# Patient Record
Sex: Female | Born: 1960 | Race: Black or African American | Hispanic: No | Marital: Single | Smoking: Former smoker
Health system: Southern US, Community
[De-identification: ages and names within clinical notes are randomized; demographics above are authoritative.]

## PROBLEM LIST (undated history)

## (undated) DIAGNOSIS — R0981 Nasal congestion: Secondary | ICD-10-CM

## (undated) DIAGNOSIS — R011 Cardiac murmur, unspecified: Secondary | ICD-10-CM

## (undated) DIAGNOSIS — M1990 Unspecified osteoarthritis, unspecified site: Secondary | ICD-10-CM

## (undated) DIAGNOSIS — R42 Dizziness and giddiness: Secondary | ICD-10-CM

## (undated) DIAGNOSIS — M19049 Primary osteoarthritis, unspecified hand: Secondary | ICD-10-CM

## (undated) DIAGNOSIS — M79604 Pain in right leg: Secondary | ICD-10-CM

## (undated) DIAGNOSIS — E119 Type 2 diabetes mellitus without complications: Secondary | ICD-10-CM

## (undated) DIAGNOSIS — M79605 Pain in left leg: Secondary | ICD-10-CM

## (undated) HISTORY — DX: Type 2 diabetes mellitus without complications: E11.9

## (undated) HISTORY — DX: Nasal congestion: R09.81

## (undated) HISTORY — PX: HAND RECONSTRUCTION: SHX1730

## (undated) HISTORY — DX: Cardiac murmur, unspecified: R01.1

## (undated) HISTORY — DX: Dizziness and giddiness: R42

## (undated) HISTORY — DX: Pain in right leg: M79.604

## (undated) HISTORY — DX: Pain in right leg: M79.605

## (undated) HISTORY — DX: Primary osteoarthritis, unspecified hand: M19.049

## (undated) HISTORY — DX: Unspecified osteoarthritis, unspecified site: M19.90

---

## 2006-03-30 ENCOUNTER — Emergency Department: Payer: Self-pay | Admitting: Emergency Medicine

## 2006-05-20 ENCOUNTER — Emergency Department: Payer: Self-pay | Admitting: Emergency Medicine

## 2007-04-07 ENCOUNTER — Emergency Department: Payer: Self-pay | Admitting: Emergency Medicine

## 2007-08-11 ENCOUNTER — Emergency Department: Payer: Self-pay | Admitting: Emergency Medicine

## 2009-02-02 ENCOUNTER — Emergency Department: Payer: Self-pay | Admitting: Emergency Medicine

## 2009-03-03 ENCOUNTER — Emergency Department: Payer: Self-pay | Admitting: Unknown Physician Specialty

## 2009-04-18 ENCOUNTER — Emergency Department: Payer: Self-pay | Admitting: Emergency Medicine

## 2011-12-26 ENCOUNTER — Ambulatory Visit: Payer: Self-pay

## 2012-09-26 ENCOUNTER — Ambulatory Visit: Payer: Self-pay | Admitting: Family Medicine

## 2012-12-05 ENCOUNTER — Ambulatory Visit: Payer: Self-pay | Admitting: Gastroenterology

## 2012-12-20 ENCOUNTER — Ambulatory Visit: Payer: Self-pay | Admitting: Family Medicine

## 2013-01-20 ENCOUNTER — Emergency Department: Payer: Self-pay | Admitting: Emergency Medicine

## 2013-01-20 LAB — BASIC METABOLIC PANEL
Creatinine: 0.72 mg/dL (ref 0.60–1.30)
Osmolality: 277 (ref 275–301)
Potassium: 3.9 mmol/L (ref 3.5–5.1)

## 2013-01-20 LAB — CBC WITH DIFFERENTIAL/PLATELET
Basophil #: 0 10*3/uL (ref 0.0–0.1)
Basophil %: 0.8 %
Eosinophil #: 0.1 10*3/uL (ref 0.0–0.7)
Eosinophil %: 3 %
HCT: 40 % (ref 35.0–47.0)
HGB: 13.6 g/dL (ref 12.0–16.0)
Lymphocyte #: 2.2 10*3/uL (ref 1.0–3.6)
MCH: 29.4 pg (ref 26.0–34.0)
MCV: 87 fL (ref 80–100)
Monocyte %: 10.6 %
Neutrophil #: 1.7 10*3/uL (ref 1.4–6.5)
Neutrophil %: 37.1 %
RDW: 12.8 % (ref 11.5–14.5)

## 2013-01-20 LAB — TROPONIN I: Troponin-I: <0.02 ng/mL

## 2013-02-08 ENCOUNTER — Emergency Department: Payer: Self-pay | Admitting: Emergency Medicine

## 2013-02-08 LAB — URINALYSIS, COMPLETE
Bilirubin,UR: NEGATIVE
Blood: NEGATIVE
Ketone: NEGATIVE
Leukocyte Esterase: NEGATIVE
Ph: 7 (ref 4.5–8.0)
Specific Gravity: 1.012 (ref 1.003–1.030)

## 2013-02-08 LAB — WET PREP, GENITAL

## 2013-02-09 LAB — URINE CULTURE

## 2013-03-06 ENCOUNTER — Ambulatory Visit: Payer: Self-pay | Admitting: Family Medicine

## 2013-03-27 ENCOUNTER — Encounter: Payer: Self-pay | Admitting: Podiatry

## 2013-04-16 ENCOUNTER — Encounter: Payer: Self-pay | Admitting: Podiatry

## 2013-04-16 ENCOUNTER — Ambulatory Visit (INDEPENDENT_AMBULATORY_CARE_PROVIDER_SITE_OTHER): Payer: BC Managed Care – PPO | Admitting: Podiatry

## 2013-04-16 VITALS — BP 111/67 | HR 60 | Resp 16 | Ht 65.0 in | Wt 167.0 lb

## 2013-04-16 DIAGNOSIS — L608 Other nail disorders: Secondary | ICD-10-CM

## 2013-04-16 DIAGNOSIS — M79609 Pain in unspecified limb: Secondary | ICD-10-CM

## 2013-04-16 NOTE — Progress Notes (Signed)
   Subjective:    Patient ID: Brenda Ellison, female    DOB: 09/29/1960, 53 y.o.   MRN: [id number]  HPI Comments: Pt states that she is unsure why she was sent here, dr Sherryll Burgershah made the referral . She is complaining about legs hurting and is seeing a vascular doctor and having test run next week for her pain in legs.  her toenails are thick and discolored and has difficulty cutting them.      Review of Systems  HENT: Positive for ear pain, sneezing and sore throat.   Eyes: Positive for pain, redness and itching.  Respiratory: Positive for cough and chest tightness.   Cardiovascular: Positive for chest pain.       Calf pain when standing   Gastrointestinal: Positive for abdominal pain.  Endocrine:       Excessive thirst   Musculoskeletal: Positive for back pain.       Joint pain  Muscle pain   Neurological: Positive for dizziness, weakness, light-headedness and headaches.  Psychiatric/Behavioral: Positive for confusion. The patient is nervous/anxious.   All other systems reviewed and are negative.       Objective:   Physical Exam: I have reviewed her past medical history medications allergies surgeries social history. Pulses are palpable bilateral neurologic sensorium is intact deep tendon reflexes are intact muscle strength is intact orthopedic evaluation demonstrates all joints distal to the ankle a full range of motion without crepitation. Cutaneous evaluation demonstrates supple well hydrated cutis with exception of the hallux nails bilateral which do demonstrate thickening with subungual debris and are tender on palpation. It's hard to ascertain whether or not these are fungal nails versus dystrophic nails.        Assessment & Plan:  Assessment: Nail dystrophy until proven otherwise hallux bilateral.  Plan: Biopsy of the nail and skin today sent for pathology evaluation. We will inform her once her cultures come back.

## 2013-04-30 ENCOUNTER — Telehealth: Payer: Self-pay | Admitting: *Deleted

## 2013-04-30 NOTE — Telephone Encounter (Signed)
NO VOICEMAIL SET UP

## 2013-05-01 ENCOUNTER — Encounter: Payer: Self-pay | Admitting: Podiatry

## 2013-05-19 ENCOUNTER — Ambulatory Visit (INDEPENDENT_AMBULATORY_CARE_PROVIDER_SITE_OTHER): Payer: BC Managed Care – PPO | Admitting: Podiatry

## 2013-05-19 ENCOUNTER — Other Ambulatory Visit: Payer: Self-pay | Admitting: Podiatry

## 2013-05-19 ENCOUNTER — Encounter: Payer: Self-pay | Admitting: Podiatry

## 2013-05-19 VITALS — BP 123/78 | HR 60 | Resp 16

## 2013-05-19 DIAGNOSIS — Z79899 Other long term (current) drug therapy: Secondary | ICD-10-CM

## 2013-05-19 LAB — CBC WITH DIFFERENTIAL/PLATELET
BASOS ABS: 0.1 10*3/uL (ref 0.0–0.1)
BASOS PCT: 0.9 %
EOS ABS: 0.1 10*3/uL (ref 0.0–0.7)
Eosinophil %: 2.5 %
HCT: 38.5 % (ref 35.0–47.0)
HGB: 12.8 g/dL (ref 12.0–16.0)
LYMPHS ABS: 2.7 10*3/uL (ref 1.0–3.6)
LYMPHS PCT: 48.1 %
MCH: 29.2 pg (ref 26.0–34.0)
MCHC: 33.2 g/dL (ref 32.0–36.0)
MCV: 88 fL (ref 80–100)
MONO ABS: 0.4 x10 3/mm (ref 0.2–0.9)
MONOS PCT: 7.9 %
NEUTROS PCT: 40.6 %
Neutrophil #: 2.3 10*3/uL (ref 1.4–6.5)
PLATELETS: 206 10*3/uL (ref 150–440)
RBC: 4.37 10*6/uL (ref 3.80–5.20)
RDW: 13.1 % (ref 11.5–14.5)
WBC: 5.6 10*3/uL (ref 3.6–11.0)

## 2013-05-19 LAB — HEPATIC FUNCTION PANEL A (ARMC)
ALBUMIN: 3.7 g/dL (ref 3.4–5.0)
ALK PHOS: 96 U/L
AST: 18 U/L (ref 15–37)
BILIRUBIN DIRECT: 0.1 mg/dL (ref 0.00–0.20)
Bilirubin,Total: 0.5 mg/dL (ref 0.2–1.0)
SGPT (ALT): 24 U/L (ref 12–78)
TOTAL PROTEIN: 7.7 g/dL (ref 6.4–8.2)

## 2013-05-19 MED ORDER — TERBINAFINE HCL 250 MG PO TABS: 250.0000 mg | ORAL_TABLET | Freq: Every day | ORAL | Status: DC

## 2013-05-19 NOTE — Progress Notes (Signed)
She presents today for followup of her fungal culture. There is positive for fungus.  Objective: Vital signs are stable she is alert and oriented x3. Onychomycosis.  Assessment: Pain in limb secondary to onychomycosis.  Plan: Discussed etiology pathology conservative versus surgical therapies. At this point we are going to initiate Lamisil therapy 250 mg tablets one by mouth daily #30 with no refills. She'll also have blood work drawn for me.

## 2013-06-11 ENCOUNTER — Telehealth: Payer: Self-pay | Admitting: *Deleted

## 2013-06-11 NOTE — Telephone Encounter (Signed)
Tried to call patient regarding her blood work , just a reminder to have it done  , pt does not have a voice mail set up.

## 2013-06-16 ENCOUNTER — Ambulatory Visit (INDEPENDENT_AMBULATORY_CARE_PROVIDER_SITE_OTHER): Payer: BC Managed Care – PPO | Admitting: Podiatry

## 2013-06-16 ENCOUNTER — Encounter: Payer: Self-pay | Admitting: Podiatry

## 2013-06-16 VITALS — BP 122/80 | HR 60 | Resp 16

## 2013-06-16 DIAGNOSIS — Z79899 Other long term (current) drug therapy: Secondary | ICD-10-CM

## 2013-06-16 MED ORDER — TERBINAFINE HCL 250 MG PO TABS: 250.0000 mg | ORAL_TABLET | Freq: Every day | ORAL | Status: DC

## 2013-06-16 NOTE — Progress Notes (Signed)
She presents today after her first dose of Lamisil states that to start C. little bit of a difference.  Objective: No nail changes as of yet today.  Assessment: Onychomycosis bilateral.  Plan: Started her on her 90 day dose of Lamisil 250 mg 1 by mouth daily. She was also sent for liver profile and CBC. I will followup with her should this come back abnormal. Otherwise I will followup with her in 4 weeks

## 2013-06-17 ENCOUNTER — Other Ambulatory Visit: Payer: Self-pay | Admitting: Podiatry

## 2013-06-17 LAB — CBC WITH DIFFERENTIAL/PLATELET
BASOS ABS: 0 10*3/uL (ref 0.0–0.1)
BASOS PCT: 0.9 %
EOS ABS: 0.2 10*3/uL (ref 0.0–0.7)
EOS PCT: 3.5 %
HCT: 39.1 % (ref 35.0–47.0)
HGB: 13.1 g/dL (ref 12.0–16.0)
Lymphocyte #: 2.1 10*3/uL (ref 1.0–3.6)
Lymphocyte %: 46.6 %
MCH: 29.7 pg (ref 26.0–34.0)
MCHC: 33.5 g/dL (ref 32.0–36.0)
MCV: 89 fL (ref 80–100)
MONO ABS: 0.4 x10 3/mm (ref 0.2–0.9)
Monocyte %: 8.7 %
Neutrophil #: 1.8 10*3/uL (ref 1.4–6.5)
Neutrophil %: 40.3 %
PLATELETS: 199 10*3/uL (ref 150–440)
RBC: 4.4 10*6/uL (ref 3.80–5.20)
RDW: 13.1 % (ref 11.5–14.5)
WBC: 4.5 10*3/uL (ref 3.6–11.0)

## 2013-06-17 LAB — HEPATIC FUNCTION PANEL A (ARMC)
ALK PHOS: 96 U/L
ALT: 26 U/L (ref 12–78)
Albumin: 3.7 g/dL (ref 3.4–5.0)
Bilirubin,Total: 0.4 mg/dL (ref 0.2–1.0)
SGOT(AST): 16 U/L (ref 15–37)
Total Protein: 7.8 g/dL (ref 6.4–8.2)

## 2013-06-23 ENCOUNTER — Telehealth: Payer: Self-pay | Admitting: *Deleted

## 2013-06-23 NOTE — Telephone Encounter (Signed)
Brenda Ellison CAME INTO OFFICE STATING THAT SHE IS HAVING A REACTION TO THE LAMISIL POSSIBLY. SHE HAS ALREADY COMPLETED A MONTH OF THE LAMISIL WITH NO PROBLEM. EXPLAINED TO PATIENT THAT IT MAY NOT BE THE LAMISIL BUT SHE CAN START TAKING IT EVERY OTHER DAY INSTEAD OF EVERY DAY. SHE STATED THAT HER HANDS AND FEET WERE ITCHY AND THAT SHE WOKE UP Friday WITH DIARRHEA

## 2013-06-24 ENCOUNTER — Encounter: Payer: Self-pay | Admitting: *Deleted

## 2013-06-24 NOTE — Progress Notes (Signed)
HAD CALLED [facility] FOR BLOOD WORK X3. HAVE NOT RECEIVED INFO YET. ROBIN AT [facility] SAID SHE WILL MANUALLY PRINT OUT BLOOD WORK AND FAX OVER RESULTS.

## 2013-08-13 ENCOUNTER — Ambulatory Visit: Payer: Self-pay | Admitting: Family Medicine

## 2013-10-13 ENCOUNTER — Ambulatory Visit: Payer: BC Managed Care – PPO | Admitting: Podiatry

## 2013-10-22 ENCOUNTER — Ambulatory Visit: Payer: BC Managed Care – PPO | Admitting: Podiatry

## 2014-03-09 ENCOUNTER — Ambulatory Visit: Payer: Self-pay | Admitting: Podiatry

## 2014-03-12 ENCOUNTER — Ambulatory Visit: Payer: Self-pay | Admitting: Family Medicine

## 2014-03-16 ENCOUNTER — Ambulatory Visit (INDEPENDENT_AMBULATORY_CARE_PROVIDER_SITE_OTHER): Payer: 59 | Admitting: Podiatry

## 2014-03-16 ENCOUNTER — Ambulatory Visit (INDEPENDENT_AMBULATORY_CARE_PROVIDER_SITE_OTHER): Payer: Commercial Managed Care - PPO

## 2014-03-16 DIAGNOSIS — B351 Tinea unguium: Secondary | ICD-10-CM

## 2014-03-16 DIAGNOSIS — M6740 Ganglion, unspecified site: Secondary | ICD-10-CM | POA: Diagnosis not present

## 2014-03-16 DIAGNOSIS — M79673 Pain in unspecified foot: Secondary | ICD-10-CM | POA: Diagnosis not present

## 2014-03-16 NOTE — Progress Notes (Signed)
She presents today after utilizing Lamisil over a year ago. She states that the nails look much better except for the fifth toes she also has a spur to the dorsal aspect left foot.  Objective: Vital signs are stable she is alert and oriented 3. Nails are thick and dystrophic but non-mycotic.  Assessment: Pain in limb secondary to dystrophic nails. 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 With neuralgias and paresthesias.  Plan: Debridement of nails 1 through 5 bilateral. Discussed appropriate lacing technique for her left foot.

## 2014-05-01 DIAGNOSIS — I340 Nonrheumatic mitral (valve) insufficiency: Secondary | ICD-10-CM | POA: Insufficient documentation

## 2014-05-01 DIAGNOSIS — I493 Ventricular premature depolarization: Secondary | ICD-10-CM | POA: Insufficient documentation

## 2014-05-01 DIAGNOSIS — I10 Essential (primary) hypertension: Secondary | ICD-10-CM | POA: Insufficient documentation

## 2014-06-22 ENCOUNTER — Encounter: Payer: Self-pay | Admitting: Podiatry

## 2014-06-22 ENCOUNTER — Ambulatory Visit (INDEPENDENT_AMBULATORY_CARE_PROVIDER_SITE_OTHER): Payer: 59 | Admitting: Podiatry

## 2014-06-22 VITALS — BP 120/74 | HR 64 | Resp 16

## 2014-06-22 DIAGNOSIS — M6740 Ganglion, unspecified site: Secondary | ICD-10-CM | POA: Diagnosis not present

## 2014-06-22 DIAGNOSIS — B351 Tinea unguium: Secondary | ICD-10-CM

## 2014-06-22 DIAGNOSIS — M79673 Pain in unspecified foot: Secondary | ICD-10-CM | POA: Diagnosis not present

## 2014-06-22 NOTE — Progress Notes (Signed)
She presents today with chief complaint of a knot to the dorsal aspect of her left foot as well as painfully elongated thick dystrophic mycotic nails.  Objective: Vital signs are stable alert and oriented 3. Pulses are palpable bilateral. Nails are thick yellow dystrophic, mycotic and painful palpation. Dorsal tarsal exostosis left foot does not demonstrate ganglion at this point.  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 Pain in limb secondary to onychomycosis.  Plan: Discussed the need for surgical intervention regarding her left foot. Also debrided nails 1 through 5 bilateral.

## 2014-10-02 ENCOUNTER — Emergency Department
Admission: EM | Admit: 2014-10-02 | Discharge: 2014-10-02 | Disposition: A | Discharge status: Home / Self Care | Payer: Self-pay | Attending: Emergency Medicine | Admitting: Emergency Medicine

## 2014-10-02 DIAGNOSIS — L508 Other urticaria: Secondary | ICD-10-CM

## 2014-10-02 DIAGNOSIS — L509 Urticaria, unspecified: Secondary | ICD-10-CM | POA: Insufficient documentation

## 2014-10-02 DIAGNOSIS — Z87891 Personal history of nicotine dependence: Secondary | ICD-10-CM | POA: Insufficient documentation

## 2014-10-02 MED ORDER — CIMETIDINE 200 MG PO TABS: 200.0000 mg | ORAL_TABLET | Freq: Two times a day (BID) | ORAL | Status: DC

## 2014-10-02 MED ORDER — CYPROHEPTADINE HCL 4 MG PO TABS: 4.0000 mg | ORAL_TABLET | Freq: Three times a day (TID) | ORAL | Status: DC | PRN

## 2014-10-02 MED ORDER — FAMOTIDINE 20 MG PO TABS
40.0000 mg | ORAL_TABLET | Freq: Two times a day (BID) | ORAL | Status: DC
Start: 2014-10-02 — End: 2014-10-02
  Administered 2014-10-02: 40 mg via ORAL
  Filled 2014-10-02: qty 2

## 2014-10-02 NOTE — ED Provider Notes (Signed)
[facility] Emergency Department Provider Note ____________________________________________  Time seen: 1518  I have reviewed the triage vital signs and the nursing notes.  HISTORY  Chief Complaint  Rash  HPI Brenda Ellison is a 54 y.o. female reports to the ED with c/o itchy rash to her legs, breasts, torso, and legs for the last week. She is unclear of the exposure or contact. She works as a Public affairs consultant at [employer], but denies any exposures there. She has not had any fevers, chills, sweats, or SOB.  She also denies any involvement if her mucous membranes.   Past Medical History  Diagnosis Date  . Dizziness of unknown cause   . Arthritis pain   . Chronic nasal congestion   . Heart murmur previously undiagnosed   . Osteoarthritis, hand   . Bilateral leg pain    There are no active problems to display for this patient.  History reviewed. No pertinent past surgical history.  Current Outpatient Rx  Name  Route  Sig  Dispense  Refill  . cimetidine (TAGAMET) 200 MG tablet   Oral   Take 1 tablet (200 mg total) by mouth 2 (two) times daily.   20 tablet   0   . cyproheptadine (PERIACTIN) 4 MG tablet   Oral   Take 1 tablet (4 mg total) by mouth 3 (three) times daily as needed for allergies.   30 tablet   0    Allergies Review of patient's allergies indicates no known allergies.  No family history on file.  Social History Social History  Substance Use Topics  . Smoking status: Former Games developer  . Smokeless tobacco: None  . Alcohol Use: No   Review of Systems  Constitutional: Negative for fever. Eyes: Negative for visual changes. ENT: Negative for sore throat. Cardiovascular: Negative for chest pain. Respiratory: Negative for shortness of breath. Gastrointestinal: Negative for abdominal pain, vomiting and diarrhea. Genitourinary: Negative for dysuria. Musculoskeletal: Negative for back pain. Skin: Positive for rash. Neurological:  Negative for headaches, focal weakness or numbness. ____________________________________________  PHYSICAL EXAM:  VITAL SIGNS: ED Triage Vitals  Enc Vitals Group     BP 10/02/14 1423 137/83 mmHg     Pulse Rate 10/02/14 1423 59     Resp 10/02/14 1423 16     Temp 10/02/14 1423 98.2 F (36.8 C)     Temp Source 10/02/14 1423 Oral     SpO2 10/02/14 1423 97 %     Weight 10/02/14 1423 165 lb (74.844 kg)     Height 10/02/14 1423  (1.6 m)     Head Cir --      Peak Flow --      Pain Score --      Pain Loc --      Pain Edu? --      Excl. in GC? --    Constitutional: Alert and oriented. Well appearing and in no distress. Eyes: Conjunctivae are normal. PERRL. Normal extraocular movements. ENT   Head: Normocephalic and atraumatic.   Nose: No congestion/rhinnorhea.   Mouth/Throat: Mucous membranes are moist.   Neck: Supple. No thyromegaly. Hematological/Lymphatic/Immunilogical: No cervical lymphadenopathy. Cardiovascular: Normal rate, regular rhythm.  Respiratory: Normal respiratory effort. No wheezes/rales/rhonchi. Gastrointestinal: Soft and nontender. No distention. Musculoskeletal: Nontender with normal range of motion in all extremities.  Neurologic:  Normal gait without ataxia. Normal speech and language. No gross focal neurologic deficits are appreciated. Skin:  Skin is warm, dry and intact. Erythematous, maculopapular rash noted to  the arms chest, torso, and legs, consistent with hives. Psychiatric: Mood and affect are normal. Patient exhibits appropriate insight and judgment. ____________________________________________  PROCEDURES  Famotidine 40 mg PO ____________________________________________  INITIAL IMPRESSION / ASSESSMENT AND PLAN / ED COURSE  Urticaria of unknown cause Treatment with Periactin and Tagamet for histamine blockade. Follow-up with primary provider as needed. ____________________________________________  FINAL CLINICAL IMPRESSION(S) /  ED DIAGNOSES  Final diagnoses:  Urticaria, acute     Lissa Hoard, PA-C 10/03/14 1412  Maurilio Lovely, MD 10/05/14 2016

## 2014-10-02 NOTE — Discharge Instructions (Signed)
Hives Hives are itchy, red, swollen areas of the skin. They can vary in size and location on your body. Hives can come and go for hours or several days (acute hives) or for several weeks (chronic hives). Hives do not spread from person to person (noncontagious). They may get worse with scratching, exercise, and emotional stress. CAUSES   Allergic reaction to food, additives, or drugs.  Infections, including the common cold.  Illness, such as vasculitis, lupus, or thyroid disease.  Exposure to sunlight, heat, or cold.  Exercise.  Stress.  Contact with chemicals. SYMPTOMS   Red or white swollen patches on the skin. The patches may change size, shape, and location quickly and repeatedly.  Itching.  Swelling of the hands, feet, and face. This may occur if hives develop deeper in the skin. DIAGNOSIS  Your caregiver can usually tell what is wrong by performing a physical exam. Skin or blood tests may also be done to determine the cause of your hives. In some cases, the cause cannot be determined. TREATMENT  Mild cases usually get better with medicines such as antihistamines. Severe cases may require an emergency epinephrine injection. If the cause of your hives is known, treatment includes avoiding that trigger.  HOME CARE INSTRUCTIONS   Avoid causes that trigger your hives.  Take antihistamines as directed by your caregiver to reduce the severity of your hives. Non-sedating or low-sedating antihistamines are usually recommended. Do not drive while taking an antihistamine.  Take any other medicines prescribed for itching as directed by your caregiver.  Wear loose-fitting clothing.  Keep all follow-up appointments as directed by your caregiver. SEEK MEDICAL CARE IF:   You have persistent or severe itching that is not relieved with medicine.  You have painful or swollen joints. SEEK IMMEDIATE MEDICAL CARE IF:   You have a fever.  Your tongue or lips are swollen.  You have  trouble breathing or swallowing.  You feel tightness in the throat or chest.  You have abdominal pain. These problems may be the first sign of a life-threatening allergic reaction. Call your local emergency services (911 in [place] MAKE SURE YOU:   Understand these instructions.  Will watch your condition.  Will get help right away if you are not doing well or get worse. Document Released: 02/06/2005 Document Revised: 02/11/2013 Document Reviewed: 05/02/2011 [facility] Patient Information 2015 [place], [place]. This information is not intended to replace advice given to you by your health care provider. Make sure you discuss any questions you have with your health care provider.   You are having an allergic reaction to an unknown source or contact.  Take the prescription meds as directed for allergy relief.  Apply OTC hydrocortisone cream as needed for itch relief.  Follow-up with Dr. Kathi Ludwig as needed.

## 2014-10-02 NOTE — ED Notes (Signed)
Pt c/o itchy rash all over for over a week.Marland Kitchen

## 2014-10-05 ENCOUNTER — Ambulatory Visit (INDEPENDENT_AMBULATORY_CARE_PROVIDER_SITE_OTHER): Payer: 59 | Admitting: Family Medicine

## 2014-10-05 ENCOUNTER — Encounter: Payer: Self-pay | Admitting: Family Medicine

## 2014-10-05 VITALS — BP 118/77 | HR 82 | Temp 98.7°F | Resp 17 | Ht 63.0 in | Wt 162.2 lb

## 2014-10-05 DIAGNOSIS — L200 Besnier's prurigo: Secondary | ICD-10-CM | POA: Diagnosis not present

## 2014-10-05 DIAGNOSIS — L239 Allergic contact dermatitis, unspecified cause: Secondary | ICD-10-CM

## 2014-10-05 DIAGNOSIS — E785 Hyperlipidemia, unspecified: Secondary | ICD-10-CM | POA: Insufficient documentation

## 2014-10-05 DIAGNOSIS — M5410 Radiculopathy, site unspecified: Secondary | ICD-10-CM | POA: Insufficient documentation

## 2014-10-05 DIAGNOSIS — M19049 Primary osteoarthritis, unspecified hand: Secondary | ICD-10-CM | POA: Insufficient documentation

## 2014-10-05 NOTE — Progress Notes (Signed)
Name: Brenda Ellison   MRN: [id number]    DOB: 15-Jul-1960   Date:10/05/2014       Progress Note  Subjective  Chief Complaint  Chief Complaint  Patient presents with  . Annual Exam    CPE  . Hypertension  . Hyperlipidemia    HPI Pt. Is here for ER follow up. Seen at the [facility] ER on Friday, October 02, 2014 for rash and itching on her legs, torso, and on her breasts. She was diagnosed with Hives and started on Periactin and Tagamet and asked to follow up with provider.  Her rash and itching is getting better. She is taking her medication as directed. No other symptoms reported.    Past Medical History  Diagnosis Date  . Dizziness of unknown cause   . Arthritis pain   . Chronic nasal congestion   . Heart murmur previously undiagnosed   . Osteoarthritis, hand   . Bilateral leg pain     History reviewed. No pertinent past surgical history.  History reviewed. No pertinent family history.  Social History   Social History  . Marital Status: Single    Spouse Name: N/A  . Number of Children: N/A  . Years of Education: N/A   Occupational History  . Not on file.   Social History Main Topics  . Smoking status: Former Games developer  . Smokeless tobacco: Not on file  . Alcohol Use: No  . Drug Use: No  . Sexual Activity: Not on file   Other Topics Concern  . Not on file   Social History Narrative   Widowed   Works full time   Carbonated beverages     Current outpatient prescriptions:  .  Cholecalciferol (VITAMIN D3) 2000 UNITS capsule, Take by mouth., Disp: , Rfl:  .  cimetidine (TAGAMET) 200 MG tablet, Take 1 tablet (200 mg total) by mouth 2 (two) times daily., Disp: 20 tablet, Rfl: 0 .  cyproheptadine (PERIACTIN) 4 MG tablet, Take 1 tablet (4 mg total) by mouth 3 (three) times daily as needed for allergies., Disp: 30 tablet, Rfl: 0 .  METRONIDAZOLE PO, Take 500 mg by mouth 2 (two) times daily. Take 1 Tablet by mouth twice a day for 7 days with food., Disp: , Rfl:  .   terbinafine (LAMISIL) 250 MG tablet, Take by mouth., Disp: , Rfl:   Allergies  Allergen Reactions  . Tramadol Rash     Review of Systems  Constitutional: Negative for fever and chills.  Respiratory: Negative for shortness of breath and wheezing.   Gastrointestinal: Negative for nausea, vomiting and abdominal pain.  Skin: Positive for itching and rash.      Objective  Filed Vitals:   10/05/14 1017  BP: 118/77  Pulse: 82  Temp: 98.7 F (37.1 C)  TempSrc: Oral  Resp: 17  Height:  (1.6 m)  Weight: 162 lb 3.2 oz (73.573 kg)  SpO2: 97%    Physical Exam  Constitutional: She is well-developed, well-nourished, and in no distress.  Cardiovascular: Normal rate and regular rhythm.   Murmur heard. Pulmonary/Chest: Effort normal and breath sounds normal.  Skin:  Healing pustular lesions on her lower extremities.  Nursing note and vitals reviewed.   Assessment & Plan  1. Allergic dermatitis Symptoms of dermatitis are resolving. Patient asked to continue therapy prescribed by the [facility] ER. Return for recheck if needed.   Shelonda Saxe Asad [facility] 10/05/2014 10:58 AM

## 2014-10-19 ENCOUNTER — Ambulatory Visit (INDEPENDENT_AMBULATORY_CARE_PROVIDER_SITE_OTHER): Payer: 59 | Admitting: Family Medicine

## 2014-10-19 ENCOUNTER — Encounter: Payer: Self-pay | Admitting: Family Medicine

## 2014-10-19 ENCOUNTER — Other Ambulatory Visit: Payer: Self-pay | Admitting: Family Medicine

## 2014-10-19 VITALS — BP 118/77 | HR 76 | Temp 98.1°F | Resp 17 | Ht 63.0 in | Wt 163.9 lb

## 2014-10-19 DIAGNOSIS — Z0000 Encounter for general adult medical examination without abnormal findings: Secondary | ICD-10-CM | POA: Diagnosis not present

## 2014-10-19 DIAGNOSIS — M47816 Spondylosis without myelopathy or radiculopathy, lumbar region: Secondary | ICD-10-CM | POA: Insufficient documentation

## 2014-10-19 DIAGNOSIS — M169 Osteoarthritis of hip, unspecified: Secondary | ICD-10-CM | POA: Insufficient documentation

## 2014-10-19 NOTE — Progress Notes (Signed)
Name: Brenda Ellison   MRN: [id number]    DOB: January 01, 1961   Date:10/19/2014       Progress Note  Subjective  Chief Complaint  Chief Complaint  Patient presents with  . Annual Exam    CPE; fasting labs  . Hyperlipidemia  . Hypertension    HPI   Pt. Is here for an Annual Physical Exam. She is doing well.   Past Medical History  Diagnosis Date  . Dizziness of unknown cause   . Arthritis pain   . Chronic nasal congestion   . Heart murmur previously undiagnosed   . Osteoarthritis, hand   . Bilateral leg pain     History reviewed. No pertinent past surgical history.  History reviewed. No pertinent family history.  Social History   Social History  . Marital Status: Single    Spouse Name: N/A  . Number of Children: N/A  . Years of Education: N/A   Occupational History  . Not on file.   Social History Main Topics  . Smoking status: Former Games developer  . Smokeless tobacco: Not on file  . Alcohol Use: No  . Drug Use: No  . Sexual Activity: Not on file   Other Topics Concern  . Not on file   Social History Narrative   Widowed   Works full time   Carbonated beverages     Current outpatient prescriptions:  .  Cholecalciferol (VITAMIN D3) 2000 UNITS capsule, Take by mouth., Disp: , Rfl:  .  cimetidine (TAGAMET) 200 MG tablet, Take 1 tablet (200 mg total) by mouth 2 (two) times daily., Disp: 20 tablet, Rfl: 0 .  cyproheptadine (PERIACTIN) 4 MG tablet, Take 1 tablet (4 mg total) by mouth 3 (three) times daily as needed for allergies., Disp: 30 tablet, Rfl: 0 .  METRONIDAZOLE PO, Take 500 mg by mouth 2 (two) times daily. Take 1 Tablet by mouth twice a day for 7 days with food., Disp: , Rfl:  .  terbinafine (LAMISIL) 250 MG tablet, Take by mouth., Disp: , Rfl:   Allergies  Allergen Reactions  . Tramadol Rash     Review of Systems  Constitutional: Negative for fever, chills, weight loss and malaise/fatigue.  HENT: Negative for congestion and ear pain.   Eyes:  Negative for blurred vision and double vision.  Respiratory: Negative for cough, sputum production, shortness of breath and wheezing.   Cardiovascular: Negative for chest pain and palpitations.  Gastrointestinal: Negative for heartburn, nausea, vomiting and blood in stool.  Genitourinary: Negative for dysuria and hematuria.  Musculoskeletal: Positive for joint pain. Negative for myalgias, back pain and neck pain.  Skin: Positive for itching and rash (intermittent rash and itching on her arms, neck, and hands.).  Neurological: Negative for dizziness and headaches.  Endo/Heme/Allergies: Does not bruise/bleed easily.  Psychiatric/Behavioral: Negative for depression. The patient is not nervous/anxious.     Objective  Filed Vitals:   10/19/14 0927  BP: 118/77  Pulse: 76  Temp: 98.1 F (36.7 C)  TempSrc: Oral  Resp: 17  Height:  (1.6 m)  Weight: 163 lb 14.4 oz (74.345 kg)  SpO2: 97%    Physical Exam  Constitutional: She is oriented to person, place, and time and well-developed, well-nourished, and in no distress. Vital signs are normal.  HENT:  Head: Normocephalic and atraumatic.  Right Ear: Hearing normal.  Left Ear: Hearing normal.  Mouth/Throat: Oropharynx is clear and moist.  TM not clearly visualized  Eyes: Pupils are equal, round, and reactive  to light.  Neck: Normal range of motion. Neck supple.  Cardiovascular: Normal rate and regular rhythm.   Pulmonary/Chest: Effort normal and breath sounds normal. Right breast exhibits no inverted nipple, no mass, no nipple discharge and no skin change. Left breast exhibits no inverted nipple, no mass, no nipple discharge and no skin change.  Abdominal: Soft. Bowel sounds are normal.  Genitourinary: Uterus normal, cervix normal, right adnexa normal and left adnexa normal.  Neurological: She is alert and oriented to person, place, and time.  Skin: Skin is warm and dry.  Psychiatric: Memory, affect and judgment normal.  Nursing  note and vitals reviewed.  Assessment & Plan  1. Annual physical exam  Routine age appropriate screenings completed. Patient had a normal colonoscopy in 2014.  - CBC w/Diff/Platelet - Comprehensive metabolic panel - Vitamin D (25 hydroxy) - TSH - Lipid Profile - Cytology - PAP - MM Digital Screening; Future   Brenda Ellison Asad [facility] 10/19/2014 9:50 AM

## 2014-10-20 ENCOUNTER — Telehealth: Payer: Self-pay | Admitting: Family Medicine

## 2014-10-20 ENCOUNTER — Other Ambulatory Visit
Admission: RE | Admit: 2014-10-20 | Discharge: 2014-10-20 | Disposition: A | Discharge status: Home / Self Care | Payer: 59 | Source: Ambulatory Visit | Attending: Family Medicine | Admitting: Family Medicine

## 2014-10-20 DIAGNOSIS — Z0000 Encounter for general adult medical examination without abnormal findings: Secondary | ICD-10-CM | POA: Insufficient documentation

## 2014-10-20 LAB — CBC WITH DIFFERENTIAL/PLATELET
Basophils Absolute: 0 10*3/uL (ref 0–0.1)
Basophils Relative: 1 %
EOS ABS: 0.3 10*3/uL (ref 0–0.7)
Eosinophils Relative: 5 %
HEMATOCRIT: 39.6 % (ref 35.0–47.0)
HEMOGLOBIN: 13 g/dL (ref 12.0–16.0)
LYMPHS ABS: 2.4 10*3/uL (ref 1.0–3.6)
Lymphocytes Relative: 51 %
MCH: 28.8 pg (ref 26.0–34.0)
MCHC: 33 g/dL (ref 32.0–36.0)
MCV: 87.5 fL (ref 80.0–100.0)
MONO ABS: 0.5 10*3/uL (ref 0.2–0.9)
MONOS PCT: 10 %
NEUTROS PCT: 33 %
Neutro Abs: 1.6 10*3/uL (ref 1.4–6.5)
Platelets: 207 10*3/uL (ref 150–440)
RBC: 4.52 MIL/uL (ref 3.80–5.20)
RDW: 13.3 % (ref 11.5–14.5)
WBC: 4.7 10*3/uL (ref 3.6–11.0)

## 2014-10-20 LAB — COMPREHENSIVE METABOLIC PANEL
ALK PHOS: 104 U/L (ref 38–126)
ALT: 27 U/L (ref 14–54)
ANION GAP: 7 (ref 5–15)
AST: 35 U/L (ref 15–41)
Albumin: 4.1 g/dL (ref 3.5–5.0)
BILIRUBIN TOTAL: 0.7 mg/dL (ref 0.3–1.2)
BUN: 13 mg/dL (ref 6–20)
CALCIUM: 9.9 mg/dL (ref 8.9–10.3)
CO2: 28 mmol/L (ref 22–32)
Chloride: 107 mmol/L (ref 101–111)
Creatinine, Ser: 0.8 mg/dL (ref 0.44–1.00)
GFR calc non Af Amer: >60 mL/min (ref >60)
Glucose, Bld: 111 mg/dL — ABNORMAL HIGH (ref 65–99)
Potassium: 4.3 mmol/L (ref 3.5–5.1)
SODIUM: 142 mmol/L (ref 135–145)
TOTAL PROTEIN: 7.8 g/dL (ref 6.5–8.1)

## 2014-10-20 LAB — LIPID PANEL
Cholesterol: 242 mg/dL — ABNORMAL HIGH (ref 0–200)
HDL: 62 mg/dL (ref >40)
LDL CALC: 155 mg/dL — AB (ref 0–99)
Total CHOL/HDL Ratio: 3.9 RATIO
Triglycerides: 124 mg/dL (ref <150)
VLDL: 25 mg/dL (ref 0–40)

## 2014-10-20 LAB — TSH: TSH: 0.982 u[IU]/mL (ref 0.350–4.500)

## 2014-10-20 NOTE — Telephone Encounter (Signed)
Routed to Dr. Shah °

## 2014-10-20 NOTE — Telephone Encounter (Signed)
Patient was seen on yesterday and was given a lab order to go to [facility]. She went today and they turned away due to having outstanding bill. Would like to know where is able to get the lab work can she take the order to the hospital?

## 2014-10-21 ENCOUNTER — Telehealth: Payer: Self-pay | Admitting: Family Medicine

## 2014-10-21 LAB — VITAMIN D 25 HYDROXY (VIT D DEFICIENCY, FRACTURES): Vit D, 25-Hydroxy: 36.6 ng/mL (ref 30.0–100.0)

## 2014-10-21 NOTE — Telephone Encounter (Signed)
Patient's lab work available in the results section.

## 2014-10-21 NOTE — Telephone Encounter (Signed)
PT WANTS THE DR TO KNOW THAT SHE DID NOT GET BLOOD WORK DONE AT [facility] BUT WENT TO THE HOSP AND HAD IT DONE THERE.

## 2014-10-21 NOTE — Telephone Encounter (Signed)
I have informed Dr. Sherryll Burger that patient had blood work done at hospital

## 2014-10-22 NOTE — Telephone Encounter (Signed)
Spoke with patient and results have been reported to patient °

## 2014-10-23 LAB — PAP LB, RFX HPV ASCU: PAP Smear Comment: 0

## 2014-10-25 ENCOUNTER — Encounter: Payer: Self-pay | Admitting: Emergency Medicine

## 2014-10-25 ENCOUNTER — Emergency Department
Admission: EM | Admit: 2014-10-25 | Discharge: 2014-10-25 | Disposition: A | Discharge status: Home / Self Care | Payer: 59 | Attending: Emergency Medicine | Admitting: Emergency Medicine

## 2014-10-25 DIAGNOSIS — Z87891 Personal history of nicotine dependence: Secondary | ICD-10-CM | POA: Diagnosis not present

## 2014-10-25 DIAGNOSIS — I10 Essential (primary) hypertension: Secondary | ICD-10-CM | POA: Diagnosis not present

## 2014-10-25 DIAGNOSIS — Z79899 Other long term (current) drug therapy: Secondary | ICD-10-CM | POA: Diagnosis not present

## 2014-10-25 DIAGNOSIS — L501 Idiopathic urticaria: Secondary | ICD-10-CM | POA: Diagnosis not present

## 2014-10-25 DIAGNOSIS — R21 Rash and other nonspecific skin eruption: Secondary | ICD-10-CM | POA: Diagnosis present

## 2014-10-25 MED ORDER — METHYLPREDNISOLONE 4 MG PO TBPK: ORAL_TABLET | ORAL | Status: DC

## 2014-10-25 MED ORDER — CIMETIDINE 400 MG PO TABS: 400.0000 mg | ORAL_TABLET | Freq: Two times a day (BID) | ORAL | Status: DC

## 2014-10-25 MED ORDER — CYPROHEPTADINE HCL 4 MG PO TABS: 4.0000 mg | ORAL_TABLET | Freq: Three times a day (TID) | ORAL | Status: DC | PRN

## 2014-10-25 MED ORDER — TRIAMCINOLONE ACETONIDE 0.1 % EX OINT: 1.0000 "application " | TOPICAL_OINTMENT | Freq: Two times a day (BID) | CUTANEOUS | Status: DC

## 2014-10-25 MED ORDER — DEXAMETHASONE SODIUM PHOSPHATE 10 MG/ML IJ SOLN
10.0000 mg | Freq: Once | INTRAMUSCULAR | Status: AC
  Administered 2014-10-25: 10 mg via INTRAMUSCULAR
  Filled 2014-10-25: qty 1

## 2014-10-25 MED ORDER — FAMOTIDINE 20 MG PO TABS
40.0000 mg | ORAL_TABLET | Freq: Once | ORAL | Status: AC
  Administered 2014-10-25: 40 mg via ORAL
  Filled 2014-10-25: qty 2

## 2014-10-25 MED ORDER — CYPROHEPTADINE HCL 4 MG PO TABS
4.0000 mg | ORAL_TABLET | Freq: Once | ORAL | Status: AC
  Administered 2014-10-25: 4 mg via ORAL
  Filled 2014-10-25: qty 1

## 2014-10-25 NOTE — Discharge Instructions (Signed)
Hives Hives are itchy, red, swollen areas of the skin. They can vary in size and location on your body. Hives can come and go for hours or several days (acute hives) or for several weeks (chronic hives). Hives do not spread from person to person (noncontagious). They may get worse with scratching, exercise, and emotional stress. CAUSES   Allergic reaction to food, additives, or drugs.  Infections, including the common cold.  Illness, such as vasculitis, lupus, or thyroid disease.  Exposure to sunlight, heat, or cold.  Exercise.  Stress.  Contact with chemicals. SYMPTOMS   Red or white swollen patches on the skin. The patches may change size, shape, and location quickly and repeatedly.  Itching.  Swelling of the hands, feet, and face. This may occur if hives develop deeper in the skin. DIAGNOSIS  Your caregiver can usually tell what is wrong by performing a physical exam. Skin or blood tests may also be done to determine the cause of your hives. In some cases, the cause cannot be determined. TREATMENT  Mild cases usually get better with medicines such as antihistamines. Severe cases may require an emergency epinephrine injection. If the cause of your hives is known, treatment includes avoiding that trigger.  HOME CARE INSTRUCTIONS   Avoid causes that trigger your hives.  Take antihistamines as directed by your caregiver to reduce the severity of your hives. Non-sedating or low-sedating antihistamines are usually recommended. Do not drive while taking an antihistamine.  Take any other medicines prescribed for itching as directed by your caregiver.  Wear loose-fitting clothing.  Keep all follow-up appointments as directed by your caregiver. SEEK MEDICAL CARE IF:   You have persistent or severe itching that is not relieved with medicine.  You have painful or swollen joints. SEEK IMMEDIATE MEDICAL CARE IF:   You have a fever.  Your tongue or lips are swollen.  You have  trouble breathing or swallowing.  You feel tightness in the throat or chest.  You have abdominal pain. These problems may be the first sign of a life-threatening allergic reaction. Call your local emergency services (911 in [place] MAKE SURE YOU:   Understand these instructions.  Will watch your condition.  Will get help right away if you are not doing well or get worse. Document Released: 02/06/2005 Document Revised: 02/11/2013 Document Reviewed: 05/02/2011 [facility] Patient Information 2015 [place], [place]. This information is not intended to replace advice given to you by your health care provider. Make sure you discuss any questions you have with your health care provider.  Take the prescription meds as directed.  Follow-up Dr. Sherryll Burger for continued symptoms.

## 2014-10-25 NOTE — ED Notes (Signed)
Pt states she went to [facility] and was recommended  benadryl every 4 hours, states she experienced no relief.

## 2014-10-25 NOTE — ED Notes (Signed)
Called pharmacy to send pt medication. 

## 2014-10-25 NOTE — ED Provider Notes (Signed)
[facility] Emergency Department Provider Note ____________________________________________  Time seen: 1555  I have reviewed the triage vital signs and the nursing notes.  HISTORY  Chief Complaint  Rash  HPI Brenda Ellison is a 54 y.o. female patient returns to the ED with complaints ofcontinued itching for an unknown allergic reaction. She was seen here by me 2 weeks prior for similar eruption, and was sent home with prescriptions for Periactin and famotidine. Patient is unclear during the interview as to whether or not she even filled and dosed prescriptions as prescribed. She did see Dr. Sherryll Burger in the interim, but was not treated or evaluated for the rash. She reports she went to the local pharmacy and was recommended Benadryl every 4 hours, but has not expressed any relief with that dosing schedule. She denies any shortness of breath, chest pain, or swelling around the lips, gum, or throat.  Past Medical History  Diagnosis Date  . Dizziness of unknown cause   . Arthritis pain   . Chronic nasal congestion   . Heart murmur previously undiagnosed   . Osteoarthritis, hand   . Bilateral leg pain     Patient Active Problem List   Diagnosis Date Noted  . Annual physical exam 10/19/2014  . 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 Degenerative arthritis of hip 10/19/2014  . Allergic dermatitis 10/05/2014  . Nerve root pain 10/05/2014  . Dyslipidemia 10/05/2014  . Arthritis of hand, degenerative 10/05/2014  . Benign essential HTN 05/01/2014   History reviewed. No pertinent past surgical history.  Current Outpatient Rx  Name  Route  Sig  Dispense  Refill  . Cholecalciferol (VITAMIN D3) 2000 UNITS capsule   Oral   Take by mouth.         . cimetidine (TAGAMET) 400 MG tablet   Oral   Take 1 tablet (400 mg total) by mouth 2 (two) times daily.   30 tablet   0   . cyproheptadine (PERIACTIN) 4 MG tablet   Oral   Take 1 tablet (4 mg total) by  mouth 3 (three) times daily as needed for allergies.   30 tablet   0   . methylPREDNISolone (MEDROL DOSEPAK) 4 MG TBPK tablet      As directed   21 tablet   0   . METRONIDAZOLE PO   Oral   Take 500 mg by mouth 2 (two) times daily. Take 1 Tablet by mouth twice a day for 7 days with food.         . terbinafine (LAMISIL) 250 MG tablet   Oral   Take by mouth.         . triamcinolone ointment (KENALOG) 0.1 %   Topical   Apply 1 application topically 2 (two) times daily.   30 g   1    Allergies Tramadol  No family history on file.  Social History Social History  Substance Use Topics  . Smoking status: Former Games developer  . Smokeless tobacco: None  . Alcohol Use: No   Review of Systems  Constitutional: Negative for fever. Eyes: Negative for visual changes. ENT: Negative for sore throat. Cardiovascular: Negative for chest pain. Respiratory: Negative for shortness of breath. Gastrointestinal: Negative for abdominal pain, vomiting and diarrhea. Genitourinary: Negative for dysuria. Musculoskeletal: Negative for back pain. Skin: Positive for rash. Neurological: Negative for headaches, focal weakness or numbness. ____________________________________________  PHYSICAL EXAM:  VITAL SIGNS: ED Triage Vitals  Enc Vitals Group     BP  10/25/14 1516 157/80 mmHg     Pulse Rate 10/25/14 1516 77     Resp 10/25/14 1516 16     Temp 10/25/14 1516 97.9 F (36.6 C)     Temp Source 10/25/14 1516 Oral     SpO2 10/25/14 1516 97 %     Weight 10/25/14 1516 163 lb (73.936 kg)     Height 10/25/14 1516  (1.6 m)     Head Cir --      Peak Flow --      Pain Score 10/25/14 1517 10     Pain Loc --      Pain Edu? --      Excl. in GC? --    Constitutional: Alert and oriented. Well appearing and in no distress. Eyes: Conjunctivae are normal. PERRL. Normal extraocular movements. ENT   Head: Normocephalic and atraumatic.   Nose: No congestion/rhinorrhea.   Mouth/Throat:  Mucous membranes are moist.   Neck: Supple. No thyromegaly. Hematological/Lymphatic/Immunological: No cervical lymphadenopathy. Cardiovascular: Normal rate, regular rhythm.  Respiratory: Normal respiratory effort. No wheezes/rales/rhonchi. Gastrointestinal: Soft and nontender. No distention. Musculoskeletal: Nontender with normal range of motion in all extremities.  Neurologic:  Normal gait without ataxia. Normal speech and language. No gross focal neurologic deficits are appreciated. Skin:  Skin is warm, dry and intact. Erythematous, macular patches to the torso and thighs consistent with urticaria. Psychiatric: Mood and affect are normal. Patient exhibits appropriate insight and judgment. ____________________________________________  PROCEDURES  Decadron 10 mg IM Cyproheptadine 4 mg PO Famotidine 40 mg PO ____________________________________________  INITIAL IMPRESSION / ASSESSMENT AND PLAN / ED COURSE  Patient reports improvement and near resolution of symptoms following medication and menstruation. Recurrent visit for idiopathic hives but unknown etiology. Patient will be treated with Periactin, Tagamet 400 mg, Medrol Dosepak, and triamcinolone ointment. She is followed Dr. Sherryll Burger for continued symptoms. ____________________________________________  FINAL CLINICAL IMPRESSION(S) / ED DIAGNOSES  Final diagnoses:  Idiopathic urticaria     Lissa Hoard, PA-C 10/25/14 1737  Arnaldo Natal, MD 10/25/14 2333

## 2014-10-25 NOTE — ED Notes (Signed)
Pt reports d/c from here 2 weeks ago, seen for allergic reaction. Rash continued to chest.

## 2014-11-20 ENCOUNTER — Ambulatory Visit: Payer: 59 | Admitting: Family Medicine

## 2014-11-24 ENCOUNTER — Ambulatory Visit: Payer: 59 | Admitting: Family Medicine

## 2014-11-30 ENCOUNTER — Encounter: Payer: Self-pay | Admitting: Family Medicine

## 2014-11-30 ENCOUNTER — Ambulatory Visit (INDEPENDENT_AMBULATORY_CARE_PROVIDER_SITE_OTHER): Payer: 59 | Admitting: Family Medicine

## 2014-11-30 VITALS — BP 154/77 | HR 73 | Temp 97.3°F | Resp 18 | Ht 63.0 in | Wt 165.3 lb

## 2014-11-30 DIAGNOSIS — L501 Idiopathic urticaria: Secondary | ICD-10-CM | POA: Insufficient documentation

## 2014-11-30 DIAGNOSIS — L509 Urticaria, unspecified: Secondary | ICD-10-CM | POA: Insufficient documentation

## 2014-11-30 NOTE — Progress Notes (Signed)
Name: Brenda Ellison   MRN: [id number]    DOB: [birth year]   Date:11/30/2014       Progress Note  Subjective  Chief Complaint  Chief Complaint  Patient presents with  . Allergic Reaction   Allergic Reaction This is a recurrent problem. The current episode started more than 1 week ago (Seen in Er on 10/25/14 for complaints of generalized itching). The problem has been resolved since onset. It is unknown what she was exposed to. Associated symptoms include itching and a rash. Pertinent negatives include no chest pain, chest pressure, coughing or difficulty breathing. Past treatments include diphenhydramine and one or more prescription drugs. The treatment provided significant relief. There is no history of asthma, food allergies, medication allergies or seasonal allergies.   Past Medical History  Diagnosis Date  . Dizziness of unknown cause   . Arthritis pain   . Chronic nasal congestion   . Heart murmur previously undiagnosed   . Osteoarthritis, hand   . Bilateral leg pain     History reviewed. No pertinent past surgical history.  History reviewed. No pertinent family history.  Social History   Social History  . Marital Status: Single    Spouse Name: N/A  . Number of Children: N/A  . Years of Education: N/A   Occupational History  . Not on file.   Social History Main Topics  . Smoking status: Former Games developer  . Smokeless tobacco: Not on file  . Alcohol Use: No  . Drug Use: No  . Sexual Activity: Not on file   Other Topics Concern  . Not on file   Social History Narrative   Widowed   Works full time   Carbonated beverages    Current outpatient prescriptions:  .  Cholecalciferol (VITAMIN D3) 2000 UNITS capsule, Take by mouth., Disp: , Rfl:  .  terbinafine (LAMISIL) 250 MG tablet, Take by mouth., Disp: , Rfl:   Allergies  Allergen Reactions  . Tramadol Rash   Review of Systems  Constitutional: Negative for fever and chills.  Respiratory: Negative for cough and  shortness of breath.   Cardiovascular: Negative for chest pain and palpitations.  Skin: Positive for itching and rash.   Objective  Filed Vitals:   11/30/14 0910  BP: 154/77  Pulse: 73  Temp: 97.3 F (36.3 C)  TempSrc: Oral  Resp: 18  Height:  (1.6 m)  Weight: 165 lb 4.8 oz (74.98 kg)  SpO2: 97%    Physical Exam  Constitutional: She is oriented to person, place, and time and well-developed, well-nourished, and in no distress.  HENT:  Head: Normocephalic and atraumatic.  Mouth/Throat: No posterior oropharyngeal erythema.  Cardiovascular: Normal rate and regular rhythm.   Murmur heard.  Systolic murmur is present with a grade of 3/6  Pulmonary/Chest: Effort normal and breath sounds normal.  Neurological: She is alert and oriented to person, place, and time.  Nursing note and vitals reviewed.  Assessment & Plan  1. Urticaria, idiopathic Symptoms of pruritus and rash have resolved. Patient referred to Allergy and Immunology for further testing. - Ambulatory referral to Allergy   Karlie Aung [facility] 11/30/2014 9:37 AM

## 2015-01-12 ENCOUNTER — Encounter: Payer: Self-pay | Admitting: Family Medicine

## 2015-01-12 ENCOUNTER — Ambulatory Visit (INDEPENDENT_AMBULATORY_CARE_PROVIDER_SITE_OTHER): Payer: 59 | Admitting: Family Medicine

## 2015-01-12 VITALS — BP 148/72 | HR 74 | Temp 97.9°F | Resp 16 | Ht 63.0 in | Wt 166.3 lb

## 2015-01-12 DIAGNOSIS — J302 Other seasonal allergic rhinitis: Secondary | ICD-10-CM | POA: Insufficient documentation

## 2015-01-12 DIAGNOSIS — Z9109 Other allergy status, other than to drugs and biological substances: Secondary | ICD-10-CM | POA: Diagnosis not present

## 2015-01-12 DIAGNOSIS — Z889 Allergy status to unspecified drugs, medicaments and biological substances status: Secondary | ICD-10-CM

## 2015-01-12 NOTE — Progress Notes (Signed)
Name: Brenda Ellison   MRN: [id number]    DOB: 06/21/1960   Date:01/12/2015       Progress Note  Subjective  Chief Complaint  Chief Complaint  Patient presents with  . Allergic Reaction    Allergic Reaction This is a recurrent problem. The problem occurs rarely. The problem is moderate. Associated with: Allergic to dust mites, mold and roaches. Pertinent negatives include no chest pain, coughing, itching, rash or trouble swallowing. Past treatments include one or more OTC medications (OTC Zyrtec). The treatment provided moderate relief. There is no history of food allergies or medication allergies.   Consultation note from allergist reviewed. Patient continues on Zyrtec without any symptoms of allergies at this time. Past Medical History  Diagnosis Date  . Dizziness of unknown cause   . Arthritis pain   . Chronic nasal congestion   . Heart murmur previously undiagnosed   . Osteoarthritis, hand   . Bilateral leg pain     History reviewed. No pertinent past surgical history.  History reviewed. No pertinent family history.  Social History   Social History  . Marital Status: Single    Spouse Name: N/A  . Number of Children: N/A  . Years of Education: N/A   Occupational History  . Not on file.   Social History Main Topics  . Smoking status: Former Games developermoker  . Smokeless tobacco: Not on file  . Alcohol Use: No  . Drug Use: No  . Sexual Activity: Not on file   Other Topics Concern  . Not on file   Social History Narrative   Widowed   Works full time   Carbonated beverages     Current outpatient prescriptions:  .  Cholecalciferol (VITAMIN D3) 2000 UNITS capsule, Take by mouth., Disp: , Rfl:  .  terbinafine (LAMISIL) 250 MG tablet, Take by mouth., Disp: , Rfl:   Allergies  Allergen Reactions  . Tramadol Rash     Review of Systems  Constitutional: Negative for fever and chills.  HENT: Negative for trouble swallowing.   Respiratory: Negative for cough and  shortness of breath.   Cardiovascular: Negative for chest pain.  Skin: Negative for itching and rash.  Neurological: Negative for headaches.    Objective  Filed Vitals:   01/12/15 0805  BP: 148/72  Pulse: 74  Temp: 97.9 F (36.6 C)  TempSrc: Oral  Resp: 16  Height: 5\' 3"  (1.6 m)  Weight: 166 lb 4.8 oz (75.433 kg)  SpO2: 96%    Physical Exam  Constitutional: She is oriented to person, place, and time and well-developed, well-nourished, and in no distress.  HENT:  Head: Normocephalic and atraumatic.  Mouth/Throat: No posterior oropharyngeal erythema.  Cardiovascular: Normal rate, regular rhythm and normal heart sounds.   Pulmonary/Chest: Effort normal and breath sounds normal.  Neurological: She is alert and oriented to person, place, and time.  Skin: Skin is warm and dry.  Nursing note and vitals reviewed.   Assessment & Plan  1. Multiple allergies Advised to continue Zyrtec. She will start on steps to clear the allergens namely roaches, dust mites, and mold from her house. Follow-up as needed.  Gaelle Adriance [facility] 01/12/2015 8:18 AM

## 2015-02-18 ENCOUNTER — Ambulatory Visit: Payer: 59 | Admitting: Family Medicine

## 2015-03-08 ENCOUNTER — Ambulatory Visit: Payer: Self-pay | Admitting: Family Medicine

## 2015-03-10 ENCOUNTER — Ambulatory Visit: Payer: 59 | Admitting: Family Medicine

## 2015-04-01 ENCOUNTER — Encounter: Payer: Self-pay | Admitting: Emergency Medicine

## 2015-04-01 ENCOUNTER — Telehealth: Payer: Self-pay | Admitting: Family Medicine

## 2015-04-01 ENCOUNTER — Emergency Department
Admission: EM | Admit: 2015-04-01 | Discharge: 2015-04-01 | Disposition: A | Discharge status: Home / Self Care | Payer: BLUE CROSS/BLUE SHIELD | Attending: Emergency Medicine | Admitting: Emergency Medicine

## 2015-04-01 DIAGNOSIS — Z87891 Personal history of nicotine dependence: Secondary | ICD-10-CM | POA: Diagnosis not present

## 2015-04-01 DIAGNOSIS — Z79899 Other long term (current) drug therapy: Secondary | ICD-10-CM | POA: Diagnosis not present

## 2015-04-01 DIAGNOSIS — E785 Hyperlipidemia, unspecified: Secondary | ICD-10-CM | POA: Diagnosis not present

## 2015-04-01 DIAGNOSIS — L501 Idiopathic urticaria: Secondary | ICD-10-CM | POA: Diagnosis not present

## 2015-04-01 DIAGNOSIS — L299 Pruritus, unspecified: Secondary | ICD-10-CM | POA: Diagnosis present

## 2015-04-01 MED ORDER — TRIAMCINOLONE ACETONIDE 0.025 % EX OINT: 1.0000 "application " | TOPICAL_OINTMENT | Freq: Two times a day (BID) | CUTANEOUS | Status: DC

## 2015-04-01 MED ORDER — METHYLPREDNISOLONE 4 MG PO TBPK: ORAL_TABLET | ORAL | Status: DC

## 2015-04-01 MED ORDER — CYPROHEPTADINE HCL 4 MG PO TABS: 4.0000 mg | ORAL_TABLET | Freq: Three times a day (TID) | ORAL | Status: DC | PRN

## 2015-04-01 MED ORDER — CIMETIDINE 400 MG PO TABS: 400.0000 mg | ORAL_TABLET | Freq: Two times a day (BID) | ORAL | Status: DC

## 2015-04-01 NOTE — ED Notes (Signed)
States she developed generalized itching since  Last Oct

## 2015-04-01 NOTE — Discharge Instructions (Signed)
Hives °Hives are itchy, red, puffy (swollen) areas of the skin. Hives can change in size and location on your body. Hives can come and go for hours, days, or weeks. Hives do not spread from person to person (noncontagious). Scratching, exercise, and stress can make your hives worse. °HOME CARE °· Avoid things that cause your hives (triggers). °· Take antihistamine medicines as told by your doctor. Do not drive while taking an antihistamine. °· Take any other medicines for itching as told by your doctor. °· Wear loose-fitting clothing. °· Keep all doctor visits as told. °GET HELP RIGHT AWAY IF:  °· You have a fever. °· Your tongue or lips are puffy. °· You have trouble breathing or swallowing. °· You feel tightness in the throat or chest. °· You have belly (abdominal) pain. °· You have lasting or severe itching that is not helped by medicine. °· You have painful or puffy joints. °These problems may be the first sign of a life-threatening allergic reaction. Call your local emergency services (911 in [place] °MAKE SURE YOU:  °· Understand these instructions. °· Will watch your condition. °· Will get help right away if you are not doing well or get worse. °  °This information is not intended to replace advice given to you by your health care provider. Make sure you discuss any questions you have with your health care provider. °  °Document Released: 11/16/2007 Document Revised: 08/08/2011 Document Reviewed: 05/02/2011 °Elsevier Interactive Patient Education ©2016 [employer] Inc[employer] ° °

## 2015-04-01 NOTE — Telephone Encounter (Signed)
Wanted to inform you that the patient is going to the emergency room on her break (2pm). States that she think she is allergic to something and can not stop scratching.

## 2015-04-01 NOTE — ED Provider Notes (Signed)
[facility] Emergency Department Provider Note  ____________________________________________  Time seen: Approximately 3:05 PM  I have reviewed the triage vital signs and the nursing notes.   HISTORY  Chief Complaint Pruritis    HPI Brenda Ellison is a 55 y.o. female patient complain 4 months of generalized itching. Patient state her PCP sent her to an allergist and affect test was was found to be allergic to numerous amount of allergens. Patient is scheduled in May 2017 to start desensitization therapy. Patient stated at this time there is no palliative measures being use. Patient rates her discomfort as a 4/10.   Past Medical History  Diagnosis Date  . Dizziness of unknown cause   . Arthritis pain   . Chronic nasal congestion   . Heart murmur previously undiagnosed   . Osteoarthritis, hand   . Bilateral leg pain     Patient Active Problem List   Diagnosis Date Noted  . Multiple allergies 01/12/2015  . 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 Annual physical exam 10/19/2014  . 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 Degenerative arthritis of hip 10/19/2014  . Allergic dermatitis 10/05/2014  . Nerve root pain 10/05/2014  . Dyslipidemia 10/05/2014  . Arthritis of hand, degenerative 10/05/2014  . Benign essential HTN 05/01/2014    History reviewed. No pertinent past surgical history.  Current Outpatient Rx  Name  Route  Sig  Dispense  Refill  . Cholecalciferol (VITAMIN D3) 2000 UNITS capsule   Oral   Take by mouth.         . cimetidine (TAGAMET) 400 MG tablet   Oral   Take 1 tablet (400 mg total) by mouth 2 (two) times daily.   14 tablet   0   . cyproheptadine (PERIACTIN) 4 MG tablet   Oral   Take 1 tablet (4 mg total) by mouth 3 (three) times daily as needed for allergies.   30 tablet   0   . methylPREDNISolone (MEDROL DOSEPAK) 4 MG TBPK tablet      Take Tapered dose as directed   21 tablet   0   . terbinafine  (LAMISIL) 250 MG tablet   Oral   Take by mouth.         . triamcinolone (KENALOG) 0.025 % ointment   Topical   Apply 1 application topically 2 (two) times daily.   30 g   0     Allergies Tramadol  No family history on file.  Social History Social History  Substance Use Topics  . Smoking status: Former Games developer  . Smokeless tobacco: None  . Alcohol Use: No    Review of Systems Constitutional: No fever/chills Eyes: No visual changes. ENT: No sore throat. Cardiovascular: Denies chest pain. Respiratory: Denies shortness of breath. Gastrointestinal: No abdominal pain.  No nausea, no vomiting.  No diarrhea.  No constipation. Genitourinary: Negative for dysuria. Musculoskeletal: Negative for back pain. Skin: Positive for rash. Neurological: Negative for headaches, focal weakness or numbness. Endocrine:Hyperlipidemia Allergic/Immunilogical: Tramadol  10-point ROS otherwise negative.  ____________________________________________   PHYSICAL EXAM:  VITAL SIGNS: ED Triage Vitals  Enc Vitals Group     BP --      Pulse --      Resp --      Temp --      Temp src --      SpO2 --      [facility] kg)     Height 04/01/15 1454  (1.626  m)     Head Cir --      Peak Flow --      Pain Score 04/01/15 1453 4     Pain Loc --      Pain Edu? --      Excl. in GC? --     Constitutional: Alert and oriented. Well appearing and in no acute distress. Eyes: Conjunctivae are normal. PERRL. EOMI. Head: Atraumatic. Nose: No congestion/rhinnorhea. Mouth/Throat: Mucous membranes are moist.  Oropharynx non-erythematous. Neck: No stridor. No cervical spine tenderness to palpation. Hematological/Lymphatic/Immunilogical: No cervical lymphadenopathy. Cardiovascular: Normal rate, regular rhythm. Grossly normal heart sounds.  Good peripheral circulation. Respiratory: Normal respiratory effort.  No retractions. Lungs CTAB. Gastrointestinal: Soft and nontender.  No distention. No abdominal bruits. No CVA tenderness. Musculoskeletal: No lower extremity tenderness nor edema.  No joint effusions. Neurologic:  Normal speech and language. No gross focal neurologic deficits are appreciated. No gait instability. Skin:  Skin is warm, dry and intact. Scattered wheals. No signs of excoriation. No signs symptoms secondary infection. Psychiatric: Mood and affect are normal. Speech and behavior are normal.  ____________________________________________   LABS (all labs ordered are listed, but only abnormal results are displayed)  Labs Reviewed - No data to display ____________________________________________  EKG   ____________________________________________  RADIOLOGY   ____________________________________________   PROCEDURES  Procedure(s) performed: None  Critical Care performed: No  ____________________________________________   INITIAL IMPRESSION / ASSESSMENT AND PLAN / ED COURSE  Pertinent labs & imaging results that were available during my care of the patient were reviewed by me and considered in my medical decision making (see chart for details).  Urticaria. Patient advised to follow-up with scheduled allergist appointment. She given a prescription for Periactin, Medrol Dosepak, Kenalog ointment, and Tagamet. ____________________________________________   FINAL CLINICAL IMPRESSION(S) / ED DIAGNOSES  Final diagnoses:  Urticaria, idiopathic      Joni Reining, PA-C 04/01/15 1514  Joni Reining, PA-C 04/01/15 1516  Jennye Moccasin, MD 04/01/15 1950

## 2015-04-01 NOTE — Telephone Encounter (Signed)
Routed to Dr. Shah for advice  

## 2015-04-02 NOTE — Telephone Encounter (Signed)
THANK YOU

## 2015-04-07 ENCOUNTER — Ambulatory Visit: Payer: Self-pay | Admitting: Family Medicine

## 2015-04-27 ENCOUNTER — Ambulatory Visit: Payer: Self-pay | Admitting: Family Medicine

## 2015-05-07 ENCOUNTER — Ambulatory Visit (INDEPENDENT_AMBULATORY_CARE_PROVIDER_SITE_OTHER): Payer: BLUE CROSS/BLUE SHIELD | Admitting: Family Medicine

## 2015-05-07 ENCOUNTER — Encounter: Payer: Self-pay | Admitting: Family Medicine

## 2015-05-07 ENCOUNTER — Ambulatory Visit
Admission: RE | Admit: 2015-05-07 | Discharge: 2015-05-07 | Disposition: A | Discharge status: Home / Self Care | Payer: BLUE CROSS/BLUE SHIELD | Source: Ambulatory Visit | Attending: Family Medicine | Admitting: Family Medicine

## 2015-05-07 VITALS — BP 140/68 | HR 69 | Temp 97.8°F | Resp 16 | Ht 64.0 in | Wt 169.1 lb

## 2015-05-07 DIAGNOSIS — M25531 Pain in right wrist: Secondary | ICD-10-CM | POA: Insufficient documentation

## 2015-05-07 DIAGNOSIS — L501 Idiopathic urticaria: Secondary | ICD-10-CM

## 2015-05-07 DIAGNOSIS — R739 Hyperglycemia, unspecified: Secondary | ICD-10-CM | POA: Insufficient documentation

## 2015-05-07 DIAGNOSIS — R938 Abnormal findings on diagnostic imaging of other specified body structures: Secondary | ICD-10-CM | POA: Insufficient documentation

## 2015-05-07 LAB — GLUCOSE, POCT (MANUAL RESULT ENTRY): POC Glucose: 97 mg/dl (ref 70–99)

## 2015-05-07 LAB — POCT GLYCOSYLATED HEMOGLOBIN (HGB A1C): HEMOGLOBIN A1C: 6.1

## 2015-05-07 NOTE — Progress Notes (Signed)
Name: Brenda Ellison   MRN: [id number]    DOB: 10/04/1960   Date:05/07/2015       Progress Note  Subjective  Chief Complaint  Chief Complaint  Patient presents with  . Follow-up    4 mo    HPI  Hives: Pt. Presents for hospital follow up. Seen in [place] ER on 04/01/15 for generalized itching (itching all over, rash on certain body areas). Was diagnosed with Urticaria and was started on Prednisone, Cimetidine, Cyproheptadine, and Triamcinolone ointment. She has previously seen allergy and immunology and next appointment is in May 2017. Itching and rash has resollved after taking the medications prescribed from the ER. ER notes reviewed and discussed with patient in detail.  Pt. Also wishes to be referred to an 'arthritis specialist' for pain in her right wrist and right thumb. Present intermittently, noticed for about a month, worse with lifting especially at work. No weakness or numbness reported in hand. Past Medical History  Diagnosis Date  . Dizziness of unknown cause   . Arthritis pain   . Chronic nasal congestion   . Heart murmur previously undiagnosed   . Osteoarthritis, hand   . Bilateral leg pain     History reviewed. No pertinent past surgical history.  History reviewed. No pertinent family history.  Social History   Social History  . Marital Status: Single    Spouse Name: N/A  . Number of Children: N/A  . Years of Education: N/A   Occupational History  . Not on file.   Social History Main Topics  . Smoking status: Former Games developermoker  . Smokeless tobacco: Not on file  . Alcohol Use: No  . Drug Use: No  . Sexual Activity: Not on file   Other Topics Concern  . Not on file   Social History Narrative   Widowed   Works full time   Carbonated beverages     Current outpatient prescriptions:  .  Cholecalciferol (VITAMIN D3) 2000 UNITS capsule, Take by mouth., Disp: , Rfl:  .  cimetidine (TAGAMET) 400 MG tablet, Take 1 tablet (400 mg total) by mouth 2 (two)  times daily., Disp: 14 tablet, Rfl: 0 .  cyproheptadine (PERIACTIN) 4 MG tablet, Take 1 tablet (4 mg total) by mouth 3 (three) times daily as needed for allergies., Disp: 30 tablet, Rfl: 0 .  terbinafine (LAMISIL) 250 MG tablet, Take by mouth., Disp: , Rfl:  .  triamcinolone (KENALOG) 0.025 % ointment, Apply 1 application topically 2 (two) times daily., Disp: 30 g, Rfl: 0  Allergies  Allergen Reactions  . Tramadol Rash     Review of Systems  Constitutional: Negative for fever, chills and malaise/fatigue.  Skin: Negative for itching and rash.    Objective  Filed Vitals:   05/07/15 1006  BP: 140/68  Pulse: 69  Temp: 97.8 F (36.6 C)  TempSrc: Oral  Resp: 16  Height: 5\' 4"  (1.626 m)  Weight: 169 lb 1.6 oz (76.703 kg)  SpO2: 96%    Physical Exam  Constitutional: She is oriented to person, place, and time and well-developed, well-nourished, and in no distress.  HENT:  Head: Normocephalic and atraumatic.  Cardiovascular: Normal rate and regular rhythm.   Murmur heard. Pulmonary/Chest: Effort normal and breath sounds normal.  Musculoskeletal:       Right wrist: She exhibits normal range of motion, no tenderness and no swelling.  Neurological: She is alert and oriented to person, place, and time.  Nursing note and vitals reviewed.  Assessment & Plan  1. Urticaria, idiopathic Resolved. Patient follows with allergist and immunologist.  2. Hyperglycemia  - POCT HgB A1C - POCT Glucose (CBG)  3. Acute pain of right wrist Obtain x-rays of wrist for further assessment. - DG Wrist Complete Right; Future    Jamisen Hawes [facility] 05/07/2015 10:25 AM

## 2015-06-01 DIAGNOSIS — E782 Mixed hyperlipidemia: Secondary | ICD-10-CM | POA: Insufficient documentation

## 2015-06-01 DIAGNOSIS — E785 Hyperlipidemia, unspecified: Secondary | ICD-10-CM | POA: Insufficient documentation

## 2015-06-01 DIAGNOSIS — R0602 Shortness of breath: Secondary | ICD-10-CM | POA: Insufficient documentation

## 2015-06-07 ENCOUNTER — Ambulatory Visit (INDEPENDENT_AMBULATORY_CARE_PROVIDER_SITE_OTHER): Payer: BLUE CROSS/BLUE SHIELD | Admitting: Family Medicine

## 2015-06-07 ENCOUNTER — Ambulatory Visit (INDEPENDENT_AMBULATORY_CARE_PROVIDER_SITE_OTHER): Payer: BLUE CROSS/BLUE SHIELD | Admitting: Podiatry

## 2015-06-07 ENCOUNTER — Encounter: Payer: Self-pay | Admitting: Family Medicine

## 2015-06-07 ENCOUNTER — Encounter: Payer: Self-pay | Admitting: Podiatry

## 2015-06-07 VITALS — BP 137/68 | HR 74 | Temp 97.8°F | Resp 15 | Ht 64.0 in | Wt 168.1 lb

## 2015-06-07 DIAGNOSIS — B351 Tinea unguium: Secondary | ICD-10-CM | POA: Diagnosis not present

## 2015-06-07 DIAGNOSIS — M1990 Unspecified osteoarthritis, unspecified site: Secondary | ICD-10-CM | POA: Diagnosis not present

## 2015-06-07 DIAGNOSIS — M6740 Ganglion, unspecified site: Secondary | ICD-10-CM | POA: Diagnosis not present

## 2015-06-07 DIAGNOSIS — M898X9 Other specified disorders of bone, unspecified site: Secondary | ICD-10-CM

## 2015-06-07 DIAGNOSIS — M19041 Primary osteoarthritis, right hand: Secondary | ICD-10-CM

## 2015-06-07 DIAGNOSIS — M79676 Pain in unspecified toe(s): Secondary | ICD-10-CM

## 2015-06-07 NOTE — Progress Notes (Signed)
She presents today for a follow-up of painful elongated toenails and a bump to the top of her left foot.  Objective: Vital signs are stable she is alert and oriented 3. Pulses are strongly palpable. Dorsal exostosis first metatarsal medial cuneiform joint left foot is painful on palpation but not range of motion. 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 Painful elongated toenails bilateral.  Plan: Debridement of all the mycotic nails 1 through 5. Discussed the possible need for surgical intervention and will follow up with her 4 months.

## 2015-06-07 NOTE — Progress Notes (Signed)
Name: Brenda MaresMary A Barnette   MRN: [id number]    DOB: 06/20/1960   Date:06/07/2015       Progress Note  Subjective  Chief Complaint  Chief Complaint  Patient presents with  . Referral    Left hand    HPI  Right Hand and Wrist Pain: Pt. Presents for evaluation and management of right hand and wrist pain, present for over 2 months. She had an X ray of right wrist in March, which showed small erosions scattered amongst the carpal bones, which raised the possibility of inflammatory arthritis. Radiologist recommended MRI for further assessment She also reports pain in the palm at the bases of 3 and 4 th digits.  In the past, she has been diagnosed with osteoarthritis of left and right hands. She is not currently taking any medications for pains   Past Medical History  Diagnosis Date  . Dizziness of unknown cause   . Arthritis pain   . Chronic nasal congestion   . Heart murmur previously undiagnosed   . Osteoarthritis, hand   . Bilateral leg pain     History reviewed. No pertinent past surgical history.  History reviewed. No pertinent family history.  Social History   Social History  . Marital Status: Single    Spouse Name: N/A  . Number of Children: N/A  . Years of Education: N/A   Occupational History  . Not on file.   Social History Main Topics  . Smoking status: Former Games developermoker  . Smokeless tobacco: Not on file  . Alcohol Use: No  . Drug Use: No  . Sexual Activity: Not on file   Other Topics Concern  . Not on file   Social History Narrative   Widowed   Works full time   Carbonated beverages     Current outpatient prescriptions:  .  Cholecalciferol (VITAMIN D3) 2000 UNITS capsule, Take by mouth., Disp: , Rfl:  .  cimetidine (TAGAMET) 400 MG tablet, Take 1 tablet (400 mg total) by mouth 2 (two) times daily. (Patient not taking: Reported on 06/07/2015), Disp: 14 tablet, Rfl: 0 .  cyproheptadine (PERIACTIN) 4 MG tablet, Take 1 tablet (4 mg total) by mouth 3 (three)  times daily as needed for allergies. (Patient not taking: Reported on 06/07/2015), Disp: 30 tablet, Rfl: 0 .  triamcinolone (KENALOG) 0.025 % ointment, Apply 1 application topically 2 (two) times daily. (Patient not taking: Reported on 06/07/2015), Disp: 30 g, Rfl: 0  Allergies  Allergen Reactions  . Tramadol Rash     Review of Systems  Musculoskeletal: Positive for joint pain.      Objective  Filed Vitals:   06/07/15 1441  BP: 137/68  Pulse: 74  Temp: 97.8 F (36.6 C)  TempSrc: Oral  Resp: 15  Height: 5\' 4"  (1.626 m)  Weight: 168 lb 1.6 oz (76.25 kg)  SpO2: 95%    Physical Exam  Constitutional: She is well-developed, well-nourished, and in no distress.  Musculoskeletal:       Left hand: She exhibits tenderness. Normal sensation noted. Normal strength noted.       Hands: Nursing note and vitals reviewed.      Assessment & Plan  1. Primary osteoarthritis of right hand  - Ambulatory referral to Rheumatology  2. Inflammatory arthritis [facility](HCC) Referral to rheumatology for consideration and workup of rheumatoid arthritis and to determine if MRI should be ordered. - Ambulatory referral to Rheumatology   Jaymen Fetch Asad A. Faylene KurtzShah [facility] 06/07/2015 2:49  PM

## 2015-06-15 DIAGNOSIS — M79642 Pain in left hand: Secondary | ICD-10-CM

## 2015-06-15 DIAGNOSIS — M79641 Pain in right hand: Secondary | ICD-10-CM | POA: Insufficient documentation

## 2015-06-15 LAB — CBC AND DIFFERENTIAL
HEMATOCRIT: 39 % (ref 36–46)
HEMOGLOBIN: 12.6 g/dL (ref 12.0–16.0)
Neutrophils Absolute: 37 /uL
PLATELETS: 226 10*3/uL (ref 150–399)

## 2015-06-15 LAB — HEPATIC FUNCTION PANEL
ALT: 21 U/L (ref 7–35)
AST: 21 U/L (ref 13–35)
Alkaline Phosphatase: 107 U/L (ref 25–125)
Bilirubin, Total: 0.7 mg/dL

## 2015-06-17 LAB — CBC AND DIFFERENTIAL: WBC: 4.9 10*3/mL

## 2015-06-25 ENCOUNTER — Encounter: Payer: Self-pay | Admitting: Family Medicine

## 2015-07-06 ENCOUNTER — Emergency Department
Admission: EM | Admit: 2015-07-06 | Discharge: 2015-07-06 | Disposition: A | Discharge status: Home / Self Care | Payer: BLUE CROSS/BLUE SHIELD | Attending: Emergency Medicine | Admitting: Emergency Medicine

## 2015-07-06 DIAGNOSIS — Z87891 Personal history of nicotine dependence: Secondary | ICD-10-CM | POA: Diagnosis not present

## 2015-07-06 DIAGNOSIS — N760 Acute vaginitis: Secondary | ICD-10-CM | POA: Diagnosis not present

## 2015-07-06 DIAGNOSIS — B9689 Other specified bacterial agents as the cause of diseases classified elsewhere: Secondary | ICD-10-CM

## 2015-07-06 DIAGNOSIS — I10 Essential (primary) hypertension: Secondary | ICD-10-CM | POA: Insufficient documentation

## 2015-07-06 DIAGNOSIS — N898 Other specified noninflammatory disorders of vagina: Secondary | ICD-10-CM | POA: Diagnosis present

## 2015-07-06 DIAGNOSIS — M19049 Primary osteoarthritis, unspecified hand: Secondary | ICD-10-CM | POA: Insufficient documentation

## 2015-07-06 DIAGNOSIS — N951 Menopausal and female climacteric states: Secondary | ICD-10-CM

## 2015-07-06 LAB — WET PREP, GENITAL
Sperm: NONE SEEN
Trich, Wet Prep: NONE SEEN
YEAST WET PREP: NONE SEEN

## 2015-07-06 MED ORDER — METRONIDAZOLE 500 MG PO TABS: 500.0000 mg | ORAL_TABLET | Freq: Two times a day (BID) | ORAL | Status: DC

## 2015-07-06 NOTE — ED Provider Notes (Signed)
[facility] Emergency Department Provider Note ____________________________________________  Time seen: 2219  I have reviewed the triage vital signs and the nursing notes.  HISTORY  Chief Complaint  Vaginal Itching  HPI Brenda Ellison is a 55 y.o. female presents to the ED with 1 week complaint of vaginal irritation and itching. She denies any significant discharge but notes a mildly, malodorous scant discharge. Denies any fevers, chills, sweats and interim. She also was not concerned for any STDs that she has had any recent sexual encounters. She denies also any abnormal bleeding and reports she has been in menopause for at least 10 years. She does note some vaginal dryness and pain with her infrequent sexual encounters.  Past Medical History  Diagnosis Date  . Dizziness of unknown cause   . Arthritis pain   . Chronic nasal congestion   . Heart murmur previously undiagnosed   . Osteoarthritis, hand   . Bilateral leg pain     Patient Active Problem List   Diagnosis Date Noted  . Inflammatory arthritis (HCC) 06/07/2015  . Hyperglycemia 05/07/2015  . Acute pain of right wrist 05/07/2015  . Multiple allergies 01/12/2015  . 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 Annual physical exam 10/19/2014  . 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 Degenerative arthritis of hip 10/19/2014  . Allergic dermatitis 10/05/2014  . Nerve root pain 10/05/2014  . Dyslipidemia 10/05/2014  . Arthritis of hand, degenerative 10/05/2014  . Benign essential HTN 05/01/2014    No past surgical history on file.  Current Outpatient Rx  Name  Route  Sig  Dispense  Refill  . Cholecalciferol (VITAMIN D3) 2000 UNITS capsule   Oral   Take by mouth.         . cimetidine (TAGAMET) 400 MG tablet   Oral   Take 1 tablet (400 mg total) by mouth 2 (two) times daily. Patient not taking: Reported on 06/07/2015   14 tablet   0   . cyproheptadine (PERIACTIN) 4 MG tablet  Oral   Take 1 tablet (4 mg total) by mouth 3 (three) times daily as needed for allergies. Patient not taking: Reported on 06/07/2015   30 tablet   0   . metroNIDAZOLE (FLAGYL) 500 MG tablet   Oral   Take 1 tablet (500 mg total) by mouth 2 (two) times daily.   14 tablet   0   . triamcinolone (KENALOG) 0.025 % ointment   Topical   Apply 1 application topically 2 (two) times daily. Patient not taking: Reported on 06/07/2015   30 g   0    Allergies Tramadol  No family history on file.  Social History Social History  Substance Use Topics  . Smoking status: Former Games developermoker  . Smokeless tobacco: Not on file  . Alcohol Use: No   Review of Systems  Constitutional: Negative for fever. Gastrointestinal: Negative for abdominal pain, vomiting and diarrhea. Genitourinary: Negative for dysuria. Reports vaginal irritation and dryness Musculoskeletal: Negative for back pain. Skin: Negative for rash. Neurological: Negative for headaches, focal weakness or numbness. ____________________________________________  PHYSICAL EXAM:  VITAL SIGNS: ED Triage Vitals  Enc Vitals Group     BP 07/06/15 2123 137/92 mmHg     Pulse Rate 07/06/15 2123 67     Resp 07/06/15 2123 18     Temp 07/06/15 2123 97.9 F (36.6 C)     Temp Source 07/06/15 2123 Oral     SpO2 07/06/15 2123 97 %     Weight  07/06/15 2123 168 lb (76.204 kg)     Height 07/06/15 2123  (1.626 m)     Head Cir --      Peak Flow --      Pain Score 07/06/15 2308 10     Pain Loc --      Pain Edu? --      Excl. in GC? --    Constitutional: Alert and oriented. Well appearing and in no distress. Head: Normocephalic and atraumatic. Gastrointestinal: Soft and nontender. No distention. GU: Normal external genitalia. Mildly erythematous skin within the labia minora. Scant vaginal discharge. No CMT or adnexal masses.  Musculoskeletal: Nontender with normal range of motion in all extremities.  Neurologic:  Normal gait without ataxia.  Normal speech and language. No gross focal neurologic deficits are appreciated. Skin:  Skin is warm, dry and intact. No rash noted. ____________________________________________   LABS (pertinent positives/negatives) Labs Reviewed  WET PREP, GENITAL - Abnormal; Notable for the following:    Clue Cells Wet Prep HPF POC PRESENT (*)    WBC, Wet Prep HPF POC MODERATE (*)    All other components within normal limits  ____________________________________________  INITIAL IMPRESSION / ASSESSMENT AND PLAN / ED COURSE  Patient with acute bacterial vaginosis infection which will be treated with Flagyl. She also has vaginal dryness and atrophy secondary to menopause. She will be advised to use over-the-counter lubricants for sexual intercourse. She will also follow with primary care provider for consultation regarding hormone replacement therapy if necessary. Otherwise she is advised on over-the-counter and natural supplements to help with postmenopausal vaginal irritation. ____________________________________________  FINAL CLINICAL IMPRESSION(S) / ED DIAGNOSES  Final diagnoses:  Bacterial vaginal infection  Vaginal dryness, menopausal      Lissa Hoard, PA-C 07/06/15 2345  Emily Filbert, MD 07/09/15 1309

## 2015-07-06 NOTE — Discharge Instructions (Signed)
Bacterial Vaginosis Bacterial vaginosis is a vaginal infection that occurs when the normal balance of bacteria in the vagina is disrupted. It results from an overgrowth of certain bacteria. This is the most common vaginal infection in women of childbearing age. Treatment is important to prevent complications, especially in pregnant women, as it can cause a premature delivery. CAUSES  Bacterial vaginosis is caused by an increase in harmful bacteria that are normally present in smaller amounts in the vagina. Several different kinds of bacteria can cause bacterial vaginosis. However, the reason that the condition develops is not fully understood. RISK FACTORS Certain activities or behaviors can put you at an increased risk of developing bacterial vaginosis, including:  Having a new sex partner or multiple sex partners.  Douching.  Using an intrauterine device (IUD) for contraception. Women do not get bacterial vaginosis from toilet seats, bedding, swimming pools, or contact with objects around them. SIGNS AND SYMPTOMS  Some women with bacterial vaginosis have no signs or symptoms. Common symptoms include:  Grey vaginal discharge.  A fishlike odor with discharge, especially after sexual intercourse.  Itching or burning of the vagina and vulva.  Burning or pain with urination. DIAGNOSIS  Your health care provider will take a medical history and examine the vagina for signs of bacterial vaginosis. A sample of vaginal fluid may be taken. Your health care provider will look at this sample under a microscope to check for bacteria and abnormal cells. A vaginal pH test may also be done.  TREATMENT  Bacterial vaginosis may be treated with antibiotic medicines. These may be given in the form of a pill or a vaginal cream. A second round of antibiotics may be prescribed if the condition comes back after treatment. Because bacterial vaginosis increases your risk for sexually transmitted diseases, getting  treated can help reduce your risk for chlamydia, gonorrhea, HIV, and herpes. HOME CARE INSTRUCTIONS   Only take over-the-counter or prescription medicines as directed by your health care provider.  If antibiotic medicine was prescribed, take it as directed. Make sure you finish it even if you start to feel better.  Tell all sexual partners that you have a vaginal infection. They should see their health care provider and be treated if they have problems, such as a mild rash or itching.  During treatment, it is important that you follow these instructions:  Avoid sexual activity or use condoms correctly.  Do not douche.  Avoid alcohol as directed by your health care provider.  Avoid breastfeeding as directed by your health care provider. SEEK MEDICAL CARE IF:   Your symptoms are not improving after 3 days of treatment.  You have increased discharge or pain.  You have a fever. MAKE SURE YOU:   Understand these instructions.  Will watch your condition.  Will get help right away if you are not doing well or get worse. FOR MORE INFORMATION  Centers for Disease Control and Prevention, Division of STD Prevention: SolutionApps.co.zawww.cdc.gov/std American Sexual Health Association (ASHA): www.ashastd.org    This information is not intended to replace advice given to you by your health care provider. Make sure you discuss any questions you have with your health care provider.   Document Released: 02/06/2005 Document Revised: 02/27/2014 Document Reviewed: 09/18/2012 Elsevier Interactive Patient Education 2016 Elsevier Inc.  Take the antibiotic as directed for bacterial vaginosis. Consider using OTC vaginal lubricants for intercourse. See you provider for continued symptoms.

## 2015-07-06 NOTE — ED Notes (Signed)
Patient ambulatory to triage with steady gait, without difficulty or distress noted; pt reports having vag itching and irritation x week; st that she is not sexually active

## 2015-08-30 ENCOUNTER — Encounter: Payer: Self-pay | Admitting: Emergency Medicine

## 2015-08-30 DIAGNOSIS — M1610 Unilateral primary osteoarthritis, unspecified hip: Secondary | ICD-10-CM | POA: Insufficient documentation

## 2015-08-30 DIAGNOSIS — N760 Acute vaginitis: Secondary | ICD-10-CM | POA: Diagnosis not present

## 2015-08-30 DIAGNOSIS — E785 Hyperlipidemia, unspecified: Secondary | ICD-10-CM | POA: Insufficient documentation

## 2015-08-30 DIAGNOSIS — I10 Essential (primary) hypertension: Secondary | ICD-10-CM | POA: Diagnosis not present

## 2015-08-30 DIAGNOSIS — Z79899 Other long term (current) drug therapy: Secondary | ICD-10-CM | POA: Insufficient documentation

## 2015-08-30 DIAGNOSIS — M19049 Primary osteoarthritis, unspecified hand: Secondary | ICD-10-CM | POA: Diagnosis not present

## 2015-08-30 DIAGNOSIS — M479 Spondylosis, unspecified: Secondary | ICD-10-CM | POA: Insufficient documentation

## 2015-08-30 DIAGNOSIS — N898 Other specified noninflammatory disorders of vagina: Secondary | ICD-10-CM | POA: Diagnosis present

## 2015-08-30 DIAGNOSIS — Z87891 Personal history of nicotine dependence: Secondary | ICD-10-CM | POA: Diagnosis not present

## 2015-08-30 LAB — URINALYSIS COMPLETE WITH MICROSCOPIC (ARMC ONLY)
BILIRUBIN URINE: NEGATIVE
Bacteria, UA: NONE SEEN
GLUCOSE, UA: NEGATIVE mg/dL
Hgb urine dipstick: NEGATIVE
Ketones, ur: NEGATIVE mg/dL
Nitrite: NEGATIVE
Protein, ur: NEGATIVE mg/dL
Specific Gravity, Urine: 1.024 (ref 1.005–1.030)
pH: 5 (ref 5.0–8.0)

## 2015-08-30 NOTE — ED Notes (Signed)
Pt arrived to the ED for complaints of vaginal itching and pain. Pt reports that she was seen a couple of months ago for that same and she was told that had Bacterial Vaginosis. Pt reports that she never got better. Pt is AOx4 in no apparent distress.

## 2015-08-31 ENCOUNTER — Telehealth: Payer: Self-pay | Admitting: Emergency Medicine

## 2015-08-31 ENCOUNTER — Emergency Department
Admission: EM | Admit: 2015-08-31 | Discharge: 2015-08-31 | Disposition: A | Discharge status: Home / Self Care | Payer: BLUE CROSS/BLUE SHIELD | Attending: Emergency Medicine | Admitting: Emergency Medicine

## 2015-08-31 DIAGNOSIS — N760 Acute vaginitis: Secondary | ICD-10-CM

## 2015-08-31 LAB — WET PREP, GENITAL
Clue Cells Wet Prep HPF POC: NONE SEEN
Sperm: NONE SEEN
Trich, Wet Prep: NONE SEEN
YEAST WET PREP: NONE SEEN

## 2015-08-31 LAB — CHLAMYDIA/NGC RT PCR (ARMC ONLY)
Chlamydia Tr: DETECTED — AB
N GONORRHOEAE: NOT DETECTED

## 2015-08-31 MED ORDER — LIDOCAINE HCL (PF) 1 % IJ SOLN
0.9000 mL | Freq: Once | INTRAMUSCULAR | Status: AC
  Administered 2015-08-31: 0.9 mL
  Filled 2015-08-31: qty 5

## 2015-08-31 MED ORDER — DOXYCYCLINE HYCLATE 100 MG PO TABS: 100.0000 mg | ORAL_TABLET | Freq: Two times a day (BID) | ORAL | Status: DC

## 2015-08-31 MED ORDER — CEFTRIAXONE SODIUM 250 MG IJ SOLR
250.0000 mg | Freq: Once | INTRAMUSCULAR | Status: AC
  Administered 2015-08-31: 250 mg via INTRAMUSCULAR
  Filled 2015-08-31: qty 250

## 2015-08-31 MED ORDER — DOXYCYCLINE HYCLATE 100 MG PO TABS
100.0000 mg | ORAL_TABLET | Freq: Once | ORAL | Status: AC
  Administered 2015-08-31: 100 mg via ORAL
  Filled 2015-08-31: qty 1

## 2015-08-31 NOTE — Discharge Instructions (Signed)

## 2015-08-31 NOTE — ED Provider Notes (Signed)
[facility] Emergency Department Provider Note   ____________________________________________  Time seen: Approximately 0051  I have reviewed the triage vital signs and the nursing notes.   HISTORY  Chief Complaint Vaginal Itching    HPI Brenda Ellison is a 55 y.o. female who comes into the hospital today with some vaginal itching and burning. The patient reports she was here 3-4 months ago and diagnosed with bacterial vaginosis. She reports that after she took the medicine the itching and burning seem to come back. She reports that she has a friend that she sees every 2-3 months and has painful sexual intercourse. She reports that they do not use protection. The patient does not have an OB/GYN and does not think the itching or burning has stopped since she was last seen. The patient reports that his nerve racking which is why she came into the hospital tonight. The patient reports that she was working could not sit down due to the pain. She has been putting off coming back she is here for evaluation.   Past Medical History  Diagnosis Date  . Dizziness of unknown cause   . Arthritis pain   . Chronic nasal congestion   . Heart murmur previously undiagnosed   . Osteoarthritis, hand   . Bilateral leg pain     Patient Active Problem List   Diagnosis Date Noted  . Inflammatory arthritis (HCC) 06/07/2015  . Hyperglycemia 05/07/2015  . Acute pain of right wrist 05/07/2015  . Multiple allergies 01/12/2015  . 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 Annual physical exam 10/19/2014  . 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 Degenerative arthritis of hip 10/19/2014  . Allergic dermatitis 10/05/2014  . Nerve root pain 10/05/2014  . Dyslipidemia 10/05/2014  . Arthritis of hand, degenerative 10/05/2014  . Benign essential HTN 05/01/2014    History reviewed. No pertinent past surgical history.  Current Outpatient Rx  Name  Route  Sig  Dispense   Refill  . Cholecalciferol (VITAMIN D3) 2000 UNITS capsule   Oral   Take by mouth.         . cimetidine (TAGAMET) 400 MG tablet   Oral   Take 1 tablet (400 mg total) by mouth 2 (two) times daily. Patient not taking: Reported on 06/07/2015   14 tablet   0   . cyproheptadine (PERIACTIN) 4 MG tablet   Oral   Take 1 tablet (4 mg total) by mouth 3 (three) times daily as needed for allergies. Patient not taking: Reported on 06/07/2015   30 tablet   0   . doxycycline (VIBRA-TABS) 100 MG tablet   Oral   Take 1 tablet (100 mg total) by mouth 2 (two) times daily.   14 tablet   0   . metroNIDAZOLE (FLAGYL) 500 MG tablet   Oral   Take 1 tablet (500 mg total) by mouth 2 (two) times daily.   14 tablet   0   . triamcinolone (KENALOG) 0.025 % ointment   Topical   Apply 1 application topically 2 (two) times daily. Patient not taking: Reported on 06/07/2015   30 g   0     Allergies Tramadol  History reviewed. No pertinent family history.  Social History Social History  Substance Use Topics  . Smoking status: Former Games developer  . Smokeless tobacco: None  . Alcohol Use: No    Review of Systems Constitutional: No fever/chills Eyes: No visual changes. ENT: No sore throat. Cardiovascular: Denies chest pain. Respiratory:  Denies shortness of breath. Gastrointestinal: No abdominal pain.  No nausea, no vomiting.  No diarrhea.  No constipation. Genitourinary: vaginal itching and burning Musculoskeletal: Negative for back pain. Skin: Negative for rash. Neurological: Negative for headaches, focal weakness or numbness.  10-point ROS otherwise negative.  ____________________________________________   PHYSICAL EXAM:  VITAL SIGNS: ED Triage Vitals  Enc Vitals Group     BP 08/30/15 2152 117/53 mmHg     Pulse Rate 08/30/15 2152 62     Resp 08/30/15 2152 18     Temp 08/30/15 2152 98.1 F (36.7 C)     Temp Source 08/30/15 2152 Oral     SpO2 08/30/15 2152 98 %     Weight 08/30/15  2152 169 lb (76.658 kg)     Height 08/30/15 2152  (1.626 m)     Head Cir --      Peak Flow --      Pain Score 08/30/15 2152 9     Pain Loc --      Pain Edu? --      Excl. in GC? --     Constitutional: Alert and oriented. Well appearing and in midstress. Eyes: Conjunctivae are normal. PERRL. EOMI. Head: Atraumatic. Nose: No congestion/rhinnorhea. Mouth/Throat: Mucous membranes are moist.  Oropharynx non-erythematous. Cardiovascular: Normal rate, regular rhythm. Grossly normal heart sounds.  Good peripheral circulation. Respiratory: Normal respiratory effort.  No retractions. Lungs CTAB. Gastrointestinal: Soft and nontender. No distention. Positive bowel sounds Genitourinary: normal external genitalia with some yellow appearing discharge, no cervical motion tenderness, no adnexal pain eval Musculoskeletal: No lower extremity tenderness nor edema.   Neurologic:  Normal speech and language.  Skin:  Skin is warm, dry and intact.  Psychiatric: Mood and affect are normal.   ____________________________________________   LABS (all labs ordered are listed, but only abnormal results are displayed)  Labs Reviewed  WET PREP, GENITAL - Abnormal; Notable for the following:    WBC, Wet Prep HPF POC MANY (*)    All other components within normal limits  URINALYSIS COMPLETEWITH MICROSCOPIC ([facility] ONLY) - Abnormal; Notable for the following:    Color, Urine YELLOW (*)    APPearance CLEAR (*)    Leukocytes, UA 1+ (*)    Squamous Epithelial / LPF 0-5 (*)    All other components within normal limits  CHLAMYDIA/NGC RT PCR ([facility] ONLY)   ____________________________________________  EKG  none ____________________________________________  RADIOLOGY  none ____________________________________________   PROCEDURES  Procedure(s) performed: None  Procedures  Critical Care performed: No  ____________________________________________   INITIAL IMPRESSION / ASSESSMENT  AND PLAN / ED COURSE  Pertinent labs & imaging results that were available during my care of the patient were reviewed by me and considered in my medical decision making (see chart for details).  This is a 55 year old female who comes into the hospital today with some vaginal itching and burning. I will await the results of her wet prep.  Patient's wet prep shows many bacteria but no bacterial vaginosis, no yeast, no Trichomonas. Given the whites as well as the discomfort I will treat the patient with some antibiotics for vaginitis. She'll receive a shot of ceftriaxone as well as some doxycycline. The patient be discharged to follow-up either with the health department or with Dr. Chauncey Cruel. ____________________________________________   FINAL CLINICAL IMPRESSION(S) / ED DIAGNOSES  Final diagnoses:  Vaginitis      NEW MEDICATIONS STARTED DURING THIS VISIT:  New Prescriptions   DOXYCYCLINE (VIBRA-TABS) 100 MG TABLET  Take 1 tablet (100 mg total) by mouth 2 (two) times daily.     Note:  This document was prepared using Dragon voice recognition software and may include unintentional dictation errors.    Rebecka ApleyAllison P Kele Barthelemy, MD 08/31/15 [phone]

## 2015-08-31 NOTE — ED Notes (Signed)
Called to inform of positive chlamydia test. Pt was treated in the ED.  Left message asking her to call me.

## 2015-11-05 ENCOUNTER — Encounter: Payer: Self-pay | Admitting: Family Medicine

## 2015-11-05 ENCOUNTER — Ambulatory Visit (INDEPENDENT_AMBULATORY_CARE_PROVIDER_SITE_OTHER): Payer: BLUE CROSS/BLUE SHIELD | Admitting: Family Medicine

## 2015-11-05 DIAGNOSIS — Z833 Family history of diabetes mellitus: Secondary | ICD-10-CM | POA: Diagnosis not present

## 2015-11-05 DIAGNOSIS — N941 Unspecified dyspareunia: Secondary | ICD-10-CM

## 2015-11-05 DIAGNOSIS — R300 Dysuria: Secondary | ICD-10-CM

## 2015-11-05 LAB — POCT GLYCOSYLATED HEMOGLOBIN (HGB A1C): HEMOGLOBIN A1C: 6.3

## 2015-11-05 NOTE — Progress Notes (Signed)
Name: Collene MaresMary A Barnette   MRN: [id number]    DOB: 01/13/1961   Date:11/05/2015       Progress Note  Subjective  Chief Complaint  Chief Complaint  Patient presents with  . Dyspareunia    HPI  Pt. Presents for evaluation of painful sexual intercourse, she sees a female friend and they have sex 1-2x/month. She feels whenever they have sex, she has sharp pain in her vaginal area at the time of penetration. In the past month, she has been treated for bacterial and yeast vaginitis with antibiotics. Denies any vaginal itching but does complain of burning with urination  Past Medical History:  Diagnosis Date  . Arthritis pain   . Bilateral leg pain   . Chronic nasal congestion   . Dizziness of unknown cause   . Heart murmur previously undiagnosed   . Osteoarthritis, hand     History reviewed. No pertinent surgical history.  History reviewed. No pertinent family history.  Social History   Social History  . Marital status: Single    Spouse name: N/A  . Number of children: N/A  . Years of education: N/A   Occupational History  . Not on file.   Social History Main Topics  . Smoking status: Former Games developermoker  . Smokeless tobacco: Never Used  . Alcohol use No  . Drug use: No  . Sexual activity: Yes   Other Topics Concern  . Not on file   Social History Narrative   Widowed   Works full time   Carbonated beverages     Current Outpatient Prescriptions:  .  Cholecalciferol (VITAMIN D3) 2000 UNITS capsule, Take by mouth., Disp: , Rfl:  .  cimetidine (TAGAMET) 400 MG tablet, Take 1 tablet (400 mg total) by mouth 2 (two) times daily. (Patient not taking: Reported on 11/05/2015), Disp: 14 tablet, Rfl: 0 .  cyproheptadine (PERIACTIN) 4 MG tablet, Take 1 tablet (4 mg total) by mouth 3 (three) times daily as needed for allergies. (Patient not taking: Reported on 11/05/2015), Disp: 30 tablet, Rfl: 0 .  doxycycline (VIBRA-TABS) 100 MG tablet, Take 1 tablet (100 mg total) by mouth 2 (two) times  daily. (Patient not taking: Reported on 11/05/2015), Disp: 14 tablet, Rfl: 0 .  metroNIDAZOLE (FLAGYL) 500 MG tablet, Take 1 tablet (500 mg total) by mouth 2 (two) times daily. (Patient not taking: Reported on 11/05/2015), Disp: 14 tablet, Rfl: 0 .  triamcinolone (KENALOG) 0.025 % ointment, Apply 1 application topically 2 (two) times daily. (Patient not taking: Reported on 11/05/2015), Disp: 30 g, Rfl: 0  Allergies  Allergen Reactions  . Tramadol Rash     Review of Systems  Constitutional: Positive for malaise/fatigue. Negative for chills and fever.  Cardiovascular: Negative for chest pain.  Gastrointestinal: Negative for abdominal pain.  Genitourinary: Positive for dysuria and urgency. Negative for frequency.    Objective  Vitals:   11/05/15 0840  BP: 124/79  Pulse: 73  Resp: 16  Temp: 97.9 F (36.6 C)  TempSrc: Oral  SpO2: 97%  Weight: 165 lb 1.6 oz (74.9 kg)  Height: 5\' 4"  (1.626 m)    Physical Exam  Constitutional: She is oriented to person, place, and time and well-developed, well-nourished, and in no distress.  Cardiovascular: Normal rate, regular rhythm, S1 normal, S2 normal and normal heart sounds.   No murmur heard. Pulmonary/Chest: Breath sounds normal. She has no wheezes.  Abdominal: Soft. Bowel sounds are normal. There is no tenderness.  Musculoskeletal:  Right ankle: She exhibits no swelling.       Left ankle: She exhibits no swelling.  Neurological: She is alert and oriented to person, place, and time.  Nursing note and vitals reviewed.      Assessment & Plan  1. Female dyspareunia We will refer to gynecology for further workup, patient requesting prescription for vaginal estrogen cream which will be discussed with gynecology - Ambulatory referral to Gynecology  2. Dysuria Could not void, will return to provide a urine sample - POCT urinalysis dipstick  3. Family history of diabetes mellitus in brother Hemoglobin A1c 6.3%, consistent with  prediabetes - POCT HgB A1C   Azusena Erlandson Asad A. Faylene Kurtz [facility] 11/05/2015 9:09 AM

## 2015-11-08 ENCOUNTER — Ambulatory Visit (INDEPENDENT_AMBULATORY_CARE_PROVIDER_SITE_OTHER): Payer: BLUE CROSS/BLUE SHIELD | Admitting: Podiatry

## 2015-11-08 ENCOUNTER — Encounter: Payer: Self-pay | Admitting: Podiatry

## 2015-11-08 VITALS — Resp 16 | Ht 64.0 in | Wt 165.0 lb

## 2015-11-08 DIAGNOSIS — M79676 Pain in unspecified toe(s): Secondary | ICD-10-CM | POA: Diagnosis not present

## 2015-11-08 DIAGNOSIS — B351 Tinea unguium: Secondary | ICD-10-CM

## 2015-11-08 NOTE — Progress Notes (Signed)
She presents today with chief complaint of painful nodule dorsal aspect of the left foot though it seems to be doing much better recently. She is also concerned about her painful elongated nails bilaterally.  Objective: Vital signs are stable alert 3. Pulses are palpable. Neurologic sensorium is intact. She has dorsal exostosis left foot. Toenails are thick and dystrophic with mycotic.  Assessment: Patient lives in onychomycosis.  Plan: Debridement toenails 1 through 5 bilateral. Follow-up with her as needed.

## 2015-11-09 ENCOUNTER — Ambulatory Visit (INDEPENDENT_AMBULATORY_CARE_PROVIDER_SITE_OTHER): Payer: BLUE CROSS/BLUE SHIELD | Admitting: Family Medicine

## 2015-11-09 ENCOUNTER — Encounter: Payer: Self-pay | Admitting: Family Medicine

## 2015-11-09 VITALS — BP 121/75 | HR 71 | Temp 97.9°F | Resp 15 | Ht 64.0 in | Wt 164.7 lb

## 2015-11-09 DIAGNOSIS — Z0000 Encounter for general adult medical examination without abnormal findings: Secondary | ICD-10-CM | POA: Diagnosis not present

## 2015-11-09 LAB — COMPREHENSIVE METABOLIC PANEL
ALT: 24 U/L (ref 6–29)
AST: 28 U/L (ref 10–35)
Albumin: 3.9 g/dL (ref 3.6–5.1)
Alkaline Phosphatase: 147 U/L — ABNORMAL HIGH (ref 33–130)
BUN: 16 mg/dL (ref 7–25)
CALCIUM: 9.1 mg/dL (ref 8.6–10.4)
CO2: 26 mmol/L (ref 20–31)
Chloride: 106 mmol/L (ref 98–110)
Creat: 0.77 mg/dL (ref 0.50–1.05)
GLUCOSE: 108 mg/dL — AB (ref 65–99)
POTASSIUM: 4.1 mmol/L (ref 3.5–5.3)
Sodium: 138 mmol/L (ref 135–146)
Total Bilirubin: 0.5 mg/dL (ref 0.2–1.2)
Total Protein: 7 g/dL (ref 6.1–8.1)

## 2015-11-09 LAB — LIPID PANEL
CHOL/HDL RATIO: 3.5 ratio (ref <=5.0)
Cholesterol: 185 mg/dL (ref 125–200)
HDL: 53 mg/dL (ref >=46)
LDL CALC: 99 mg/dL (ref <130)
TRIGLYCERIDES: 166 mg/dL — AB (ref <150)
VLDL: 33 mg/dL — AB (ref <30)

## 2015-11-09 LAB — TSH: TSH: 0.76 mIU/L

## 2015-11-09 NOTE — Progress Notes (Signed)
Name: Brenda Ellison   MRN: [id number]    DOB: [birth year]   Date:11/09/2015       Progress Note  Subjective  Chief Complaint  Chief Complaint  Patient presents with  . Annual Exam    CPE    HPI  Pt. Presents for Complete Physical Exam. She will follow up with Gynecology for Pap Smear and Mammogram. Last Colonoscopy was 3 years ago, no records available. Will obtain Lipid Panel and Hep C screening.    Past Medical History:  Diagnosis Date  . Arthritis pain   . Bilateral leg pain   . Chronic nasal congestion   . Dizziness of unknown cause   . Heart murmur previously undiagnosed   . Osteoarthritis, hand     History reviewed. No pertinent surgical history.  History reviewed. No pertinent family history.  Social History   Social History  . Marital status: Single    Spouse name: N/A  . Number of children: N/A  . Years of education: N/A   Occupational History  . Not on file.   Social History Main Topics  . Smoking status: Former Games developer  . Smokeless tobacco: Never Used  . Alcohol use No  . Drug use: No  . Sexual activity: Yes   Other Topics Concern  . Not on file   Social History Narrative   Widowed   Works full time   Carbonated beverages     Current Outpatient Prescriptions:  .  Cholecalciferol (VITAMIN D3) 2000 UNITS capsule, Take by mouth., Disp: , Rfl:  .  doxycycline (VIBRA-TABS) 100 MG tablet, , Disp: , Rfl: 0  Allergies  Allergen Reactions  . Tramadol Rash     Review of Systems  Constitutional: Negative for chills, fever and malaise/fatigue.  HENT: Negative for sore throat.   Eyes: Negative for blurred vision and double vision.  Respiratory: Negative for cough and shortness of breath.   Cardiovascular: Negative for chest pain and leg swelling.  Gastrointestinal: Negative for blood in stool, heartburn, nausea and vomiting.  Genitourinary: Negative for dysuria and hematuria.  Musculoskeletal: Negative for back pain, joint pain and neck  pain.  Neurological: Negative for headaches.  Psychiatric/Behavioral: Negative for depression. The patient is not nervous/anxious and does not have insomnia.       Objective  Vitals:   11/09/15 0907  BP: 121/75  Pulse: 71  Resp: 15  Temp: 97.9 F (36.6 C)  TempSrc: Oral  SpO2: 96%  Weight: 164 lb 11.2 oz (74.7 kg)  Height: 5\' 4"  (1.626 m)    Physical Exam  Constitutional: She is oriented to person, place, and time and well-developed, well-nourished, and in no distress.  HENT:  Head: Normocephalic and atraumatic.  Right Ear: Tympanic membrane and ear canal normal.  Left Ear: Tympanic membrane and ear canal normal.  Mouth/Throat: No posterior oropharyngeal edema or posterior oropharyngeal erythema.  Eyes: Pupils are equal, round, and reactive to light.  Cardiovascular: Normal rate, regular rhythm and normal heart sounds.   No murmur heard. Pulmonary/Chest: Effort normal and breath sounds normal. She has no wheezes.  Abdominal: Soft. Bowel sounds are normal. There is no tenderness.  Musculoskeletal: She exhibits no edema.  Neurological: She is alert and oriented to person, place, and time.  Psychiatric: Mood, memory, affect and judgment normal.  Nursing note and vitals reviewed.   Assessment & Plan  1. Well woman exam without gynecological exam Patient referred to gynecology for gynecological exam and screenings. Order age appropriate screening lab work. -  Lipid Profile - Comprehensive Metabolic Panel (CMET) - TSH - Vitamin D (25 hydroxy) - Hepatitis C Antibody   Brenda Ellison Asad A. Faylene KurtzShah [facility] 11/09/2015 9:14 AM

## 2015-11-10 LAB — HEPATITIS C ANTIBODY: HCV AB: NEGATIVE

## 2015-11-10 LAB — VITAMIN D 25 HYDROXY (VIT D DEFICIENCY, FRACTURES): Vit D, 25-Hydroxy: 36 ng/mL (ref 30–100)

## 2015-11-18 ENCOUNTER — Telehealth: Payer: Self-pay | Admitting: Family Medicine

## 2015-11-18 NOTE — Telephone Encounter (Signed)
Patient called stating that her knuckle on left hand is swollen and she stated that it was due to her arthritis.  Patient would like a referral to a rheumatologist at [facility].

## 2015-11-18 NOTE — Telephone Encounter (Signed)
Routed to Dr. Sherryll BurgerShah for referral approval; Patient called stating that her knuckle on left hand is swollen and she stated that it was due to her arthritis.  Patient would like a referral to a rheumatologist at [facility].

## 2015-11-18 NOTE — Telephone Encounter (Signed)
According to patient's chart review, she is already established patient by rheumatology, last seen on 10/28/2015. She can call to schedule an appointment with a provider, no referral needed

## 2015-11-19 NOTE — Telephone Encounter (Signed)
Patient has been notified per Dr. Sherryll BurgerShah According to patient's chart review, she is already established patient by rheumatology, last seen on 10/28/2015. She can call to schedule an appointment with a provider, no referral needed

## 2015-12-08 ENCOUNTER — Encounter: Payer: BLUE CROSS/BLUE SHIELD | Admitting: Obstetrics and Gynecology

## 2015-12-09 ENCOUNTER — Encounter: Payer: BLUE CROSS/BLUE SHIELD | Admitting: Obstetrics and Gynecology

## 2015-12-26 ENCOUNTER — Encounter: Payer: Self-pay | Admitting: Emergency Medicine

## 2015-12-26 ENCOUNTER — Emergency Department
Admission: EM | Admit: 2015-12-26 | Discharge: 2015-12-26 | Disposition: A | Discharge status: Home / Self Care | Payer: BLUE CROSS/BLUE SHIELD | Attending: Emergency Medicine | Admitting: Emergency Medicine

## 2015-12-26 DIAGNOSIS — Y9389 Activity, other specified: Secondary | ICD-10-CM | POA: Insufficient documentation

## 2015-12-26 DIAGNOSIS — M25512 Pain in left shoulder: Secondary | ICD-10-CM | POA: Diagnosis not present

## 2015-12-26 DIAGNOSIS — S4992XA Unspecified injury of left shoulder and upper arm, initial encounter: Secondary | ICD-10-CM | POA: Diagnosis present

## 2015-12-26 DIAGNOSIS — I10 Essential (primary) hypertension: Secondary | ICD-10-CM | POA: Diagnosis not present

## 2015-12-26 DIAGNOSIS — Y92511 Restaurant or cafe as the place of occurrence of the external cause: Secondary | ICD-10-CM | POA: Insufficient documentation

## 2015-12-26 DIAGNOSIS — Y999 Unspecified external cause status: Secondary | ICD-10-CM | POA: Insufficient documentation

## 2015-12-26 DIAGNOSIS — Z87891 Personal history of nicotine dependence: Secondary | ICD-10-CM | POA: Insufficient documentation

## 2015-12-26 NOTE — Discharge Instructions (Signed)
Please seek medical attention for any high fevers, chest pain, shortness of breath, change in behavior, persistent vomiting, bloody stool or any other new or concerning symptoms.  
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
## 2016-01-01 ENCOUNTER — Emergency Department
Admission: EM | Admit: 2016-01-01 | Discharge: 2016-01-02 | Disposition: A | Discharge status: Home / Self Care | Payer: BLUE CROSS/BLUE SHIELD | Attending: Emergency Medicine | Admitting: Emergency Medicine

## 2016-01-01 ENCOUNTER — Encounter: Payer: Self-pay | Admitting: Emergency Medicine

## 2016-01-01 DIAGNOSIS — Z87891 Personal history of nicotine dependence: Secondary | ICD-10-CM | POA: Insufficient documentation

## 2016-01-01 DIAGNOSIS — K047 Periapical abscess without sinus: Secondary | ICD-10-CM | POA: Diagnosis not present

## 2016-01-01 DIAGNOSIS — Z79899 Other long term (current) drug therapy: Secondary | ICD-10-CM | POA: Diagnosis not present

## 2016-01-01 DIAGNOSIS — I10 Essential (primary) hypertension: Secondary | ICD-10-CM | POA: Diagnosis not present

## 2016-01-01 DIAGNOSIS — K032 Erosion of teeth: Secondary | ICD-10-CM | POA: Diagnosis not present

## 2016-01-01 DIAGNOSIS — K0889 Other specified disorders of teeth and supporting structures: Secondary | ICD-10-CM | POA: Diagnosis present

## 2016-01-01 MED ORDER — AMOXICILLIN 500 MG PO CAPS
1000.0000 mg | ORAL_CAPSULE | Freq: Once | ORAL | Status: AC
  Administered 2016-01-01: 1000 mg via ORAL
  Filled 2016-01-01: qty 2

## 2016-01-01 MED ORDER — MAGIC MOUTHWASH W/LIDOCAINE: 5.0000 mL | Freq: Four times a day (QID) | ORAL | 0 refills | Status: DC

## 2016-01-01 MED ORDER — LIDOCAINE-EPINEPHRINE 2 %-1:100000 IJ SOLN
1.7000 mL | Freq: Once | INTRAMUSCULAR | Status: AC
  Administered 2016-01-01: 1.7 mL

## 2016-01-01 MED ORDER — LIDOCAINE-EPINEPHRINE 2 %-1:100000 IJ SOLN
INTRAMUSCULAR | Status: AC
  Administered 2016-01-01: 1.7 mL
  Filled 2016-01-01: qty 1.7

## 2016-01-01 MED ORDER — AMOXICILLIN 875 MG PO TABS: 875.0000 mg | ORAL_TABLET | Freq: Two times a day (BID) | ORAL | 0 refills | Status: DC

## 2016-01-01 NOTE — Discharge Instructions (Signed)
OPTIONS FOR DENTAL FOLLOW UP CARE ° °[place] Department of Health and Human Services - Local Safety Net Dental Clinics °http://www.ncdhhs.gov/dph/oralhealth/services/safetynetclinics.htm °  °[facility] (336-562-3123) ° °[facility] [place] ([phone]) ° °[facility] ([phone] ext [phone]) ° °[facility] ([phone]) ° °[facility] ([phone]) °This clinic caters to the indigent population and is on a lottery system. °Location: °[facility], [address], [place] °Clinic Hours: °Wednesdays from 6pm - 9pm, patients seen by a lottery system. °For dates, call or go to [email or web address]/[facility]/[email or web address] °Services: °Cleanings, fillings and simple extractions. °Payment Options: °DENTAL WORK IS FREE OF CHARGE. Bring proof of income or support. °Best way to get seen: °Arrive at 5:15 pm - this is a lottery, NOT first come/first serve, so arriving earlier will not increase your chances of being seen. °  °  °[facility] °[phone] °Select option 1 for emergencies °  °Location: °[facility], [address], [place] °Clinic Hours: °No walk-ins accepted - call the day before to schedule an appointment. °Check in times are 9:30 am and 1:30 pm. °Services: °Simple extractions, temporary fillings, pulpectomy/pulp debridement, uncomplicated abscess drainage. °Payment Options: °PAYMENT IS DUE AT THE TIME OF SERVICE.  Fee is usually $100-200, additional surgical procedures (e.g. abscess drainage) may be extra. °Cash, checks, Visa/MasterCard accepted.  Can file Medicaid if patient is covered for dental - patient should call case worker to check. °No discount for [facility] patients. °Best way to get seen: °MUST call the day before and get onto the schedule. Can usually be seen the next 1-2 days. No walk-ins accepted. °  °  °[facility] °[phone] °   °Location: °[facility], [address], [place] °Clinic Hours: °M, W, Th, F 8am or 1:30pm, Tues 9a or 1:30 - first come/first served. °Services: °Simple extractions, temporary fillings, uncomplicated abscess drainage.  You do not need to be an [place] resident. °Payment Options: °PAYMENT IS DUE AT THE TIME OF SERVICE. °Dental insurance, otherwise sliding scale - bring proof of income or support. °Depending on income and treatment needed, cost is usually $50-200. °Best way to get seen: °Arrive early as it is first come/first served. °  °  °[facility] °[phone] °  °Location: °[address] °Clinic Hours: °Mon-Thu 8a-5p °Services: °Most basic dental services including extractions and fillings. °Payment Options: °PAYMENT IS DUE AT THE TIME OF SERVICE. °Sliding scale, up to 50% off - bring proof if income or support. °Medicaid with dental option accepted. °Best way to get seen: °Call to schedule an appointment, can usually be seen within 2 weeks OR they will try to see walk-ins - show up at 8a or 2p (you may have to wait). °  °  °[facility] °[phone] °[place] RESIDENTS ONLY °  °Location: °[facility], [address], [place], [place] [address] °Clinic Hours: By appointment only. °Monday - Thursday 8am-5pm, Friday 8am-12pm °Services: Cleanings, fillings, extractions. °Payment Options: °PAYMENT IS DUE AT THE TIME OF SERVICE. °Cash, Visa or MasterCard. Sliding scale - $30 minimum per service. °Best way to get seen: °Come in to office, complete packet and make an appointment - need proof of income °or support monies for each household member and proof of [place] residence. °Usually takes about a month to get in. °  °  °[facility] °[phone] °  °Location: °[address]   [place] °Clinic Hours: Walk-in Urgent Care Dental Services are offered Monday-Friday  mornings only. °The numbers of emergencies accepted daily is limited to the number of °providers available. °Maximum 15 - Mondays, Wednesdays & Thursdays °Maximum 10 - Tuesdays & Fridays °Services: °You do not need to be a [place] resident to be seen for a dental emergency. °Emergencies are defined as pain, swelling, abnormal bleeding, or dental trauma. Walkins will receive x-rays if needed. °NOTE: Dental cleaning is not an emergency. °Payment Options: °PAYMENT IS DUE AT THE TIME OF SERVICE. °Minimum co-pay is $40.00 for uninsured patients. °Minimum co-pay is $3.00 for Medicaid with dental coverage. °Dental Insurance is accepted and must be presented at time of visit. °Medicare does not cover dental. °Forms of payment: Cash, credit card, checks. °Best way to get seen: °If not previously registered with the clinic, walk-in dental registration begins at 7:15 am and is on a first come/first serve basis. °If previously registered with the clinic, call to make an appointment. °  °  °[facility] °[phone] °[place] RESIDENTS ONLY °  °Location: °[address], [place], [place] °Clinic Hours: °Mon-Thu 10a-2p °Services: Extractions only! °Payment Options: °FREE (donations accepted) - bring proof of income or support °Best way to get seen: °Call and schedule an appointment OR come at 8am on the 1st Monday of every month (except for holidays) when it is first come/first served. °  °  °[facility] °[phone] °  °Location: °[address], [place] °Clinic Hours: °Friday mornings °Services, Payment Options, Best way to get seen: °Call for info °

## 2016-01-01 NOTE — ED Provider Notes (Signed)
[facility] Emergency Department Provider Note  ____________________________________________  Time seen: Approximately 11:53 PM  I have reviewed the triage vital signs and the nursing notes.   HISTORY  Chief Complaint Dental Pain    HPI Brenda Ellison is a 55 y.o. female who presents emergency department complaining of right upper dental pain. Patient states that she has noted poor dentition and has had increasing pain to the right upper dentition. Patient reports not having a dentist. She denies any fevers or chills, difficulty breathing or swallowing, abdominal pain, nausea or vomiting. Patient states that she is use saltwater gargle, hydrogen peroxide, Orajel with some relief but that pain soon returns. No trauma to the region. No other complaints.   Past Medical History:  Diagnosis Date  . Arthritis pain   . Bilateral leg pain   . Chronic nasal congestion   . Dizziness of unknown cause   . Heart murmur previously undiagnosed   . Osteoarthritis, hand     Patient Active Problem List   Diagnosis Date Noted  . Well woman exam without gynecological exam 11/09/2015  . 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 Dysuria 11/05/2015  . Family history of diabetes mellitus in brother 11/05/2015  . Inflammatory arthritis 06/07/2015  . Hyperglycemia 05/07/2015  . Acute pain of right wrist 05/07/2015  . Multiple allergies 01/12/2015  . 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 Annual physical exam 10/19/2014  . 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 Degenerative arthritis of hip 10/19/2014  . Allergic dermatitis 10/05/2014  . Nerve root pain 10/05/2014  . Dyslipidemia 10/05/2014  . Arthritis of hand, degenerative 10/05/2014  . Benign essential HTN 05/01/2014    History reviewed. No pertinent surgical history.  Prior to Admission medications   Medication Sig Start Date End Date Taking? Authorizing Provider  amoxicillin (AMOXIL) 875 MG tablet Take 1  tablet (875 mg total) by mouth 2 (two) times daily. 01/01/16   Delorise Royals Hillis Mcphatter, PA-C  Cholecalciferol (VITAMIN D3) 2000 UNITS capsule Take by mouth.    Historical Provider, MD  doxycycline (VIBRA-TABS) 100 MG tablet  08/31/15   Historical Provider, MD  magic mouthwash w/lidocaine SOLN Take 5 mLs by mouth 4 (four) times daily. 01/01/16   Delorise Royals Avaiah Stempel, PA-C    Allergies Tramadol  History reviewed. No pertinent family history.  Social History Social History  Substance Use Topics  . Smoking status: Former Games developer  . Smokeless tobacco: Never Used  . Alcohol use No     Review of Systems  Constitutional: No fever/chills Eyes: No visual changes. No discharge [id number] for right upper dental pain Cardiovascular: no chest pain. Respiratory: no cough. No SOB. Gastrointestinal: No abdominal pain.  No nausea, no vomiting.  No diarrhea.  No constipation. Musculoskeletal: Negative for musculoskeletal pain. Skin: Negative for rash, abrasions, lacerations, ecchymosis. Neurological: Negative for headaches, focal weakness or numbness. 10-point ROS otherwise negative.  ____________________________________________   PHYSICAL EXAM:  VITAL SIGNS: ED Triage Vitals  Enc Vitals Group     BP 01/01/16 2201 (!) 155/85     Pulse Rate 01/01/16 2201 67     Resp 01/01/16 2201 16     Temp 01/01/16 2201 97.6 F (36.4 C)     Temp Source 01/01/16 2201 Oral     SpO2 01/01/16 2201 98 %     Weight 01/01/16 2202 167 lb (75.8 kg)     Height 01/01/16 2202 5\' 4"  (1.626 m)     Head Circumference --      Peak  Flow --      Pain Score 01/01/16 2202 10     Pain Loc --      Pain Edu? --      Excl. in GC? --      Constitutional: Alert and oriented. Well appearing and in no acute distress. Eyes: Conjunctivae are normal. PERRL. EOMI. Head: Atraumatic. ENT:      Ears:       Nose: No congestion/rhinnorhea.      Mouth/Throat: Mucous membranes are moist. Considerable dental lost. Remaining  dentition is scattered throughout oral cavity. Significant erosion to all remaining dentition. Second molar right upper dentition with significant erosion into the pulp with surrounding erythema and edema. No fluctuance with depression of tongue depressor. No drainage. Oropharynx is nonerythematous and nonedematous. Uvula is midline. Neck: No stridor.   Hematological/Lymphatic/Immunilogical: No cervical lymphadenopathy. Cardiovascular: Normal rate, regular rhythm. Normal S1 and S2.  Good peripheral circulation. Respiratory: Normal respiratory effort without tachypnea or retractions. Lungs CTAB. Good air entry to the bases with no decreased or absent breath sounds. Musculoskeletal: Full range of motion to all extremities. No gross deformities appreciated. Neurologic:  Normal speech and language. No gross focal neurologic deficits are appreciated.  Skin:  Skin is warm, dry and intact. No rash noted. Psychiatric: Mood and affect are normal. Speech and behavior are normal. Patient exhibits appropriate insight and judgement.   ____________________________________________   LABS (all labs ordered are listed, but only abnormal results are displayed)  Labs Reviewed - No data to display ____________________________________________  EKG   ____________________________________________  RADIOLOGY   No results found.  ____________________________________________    PROCEDURES  Procedure(s) performed:    Procedures  Dental block is performed using 1.7 mL of lidocaine with epinephrine. This is injected into the superior alveolar nerve branch right side using the dental carpi jet with 27-gauge needle. Patient tolerated well with no complications.  Medications  amoxicillin (AMOXIL) capsule 1,000 mg (1,000 mg Oral Given 01/01/16 2355)  lidocaine-EPINEPHrine (XYLOCAINE W/EPI) 2 %-1:100000 (with pres) injection 1.7 mL (1.7 mLs Infiltration Given 01/01/16 2356)      ____________________________________________   INITIAL IMPRESSION / ASSESSMENT AND PLAN / ED COURSE  Pertinent labs & imaging results that were available during my care of the patient were reviewed by me and considered in my medical decision making (see chart for details).  Review of the [place] CSRS was performed in accordance of the [facility] prior to dispensing any controlled drugs.  Clinical Course     Patient's diagnosis is consistent with Dental infection with significant dental erosion into the pulp. Patient is given dental block emergency department for symptom control. She is also given first dose of oral antibiotics.. Patient will be discharged home with prescriptions for antibiotics and Magic mouthwash. Patient does not have a dentist and list of dental options have been provided to the patient for follow-up. Patient is given ED precautions to return to the ED for any worsening or new symptoms.     ____________________________________________  FINAL CLINICAL IMPRESSION(S) / ED DIAGNOSES  Final diagnoses:  Dental infection  Dental erosion extending into pulp      NEW MEDICATIONS STARTED DURING THIS VISIT:  Discharge Medication List as of 01/01/2016 11:59 PM    START taking these medications   Details  amoxicillin (AMOXIL) 875 MG tablet Take 1 tablet (875 mg total) by mouth 2 (two) times daily., Starting Sat 01/01/2016, Print    magic mouthwash w/lidocaine SOLN Take 5 mLs by mouth 4 (four) times daily., Starting  Sat 01/01/2016, Print            This chart was dictated using voice recognition software/Dragon. Despite best efforts to proofread, errors can occur which can change the meaning. Any change was purely unintentional.    Racheal PatchesJonathan D Tanita Palinkas, PA-C 01/02/16 0022    Jene Everyobert Kinner, MD 01/02/16 [phone]

## 2016-01-01 NOTE — ED Triage Notes (Signed)
Pt reports pain to right upper gum x 2-3 days.

## 2016-01-18 ENCOUNTER — Encounter: Payer: Self-pay | Admitting: Emergency Medicine

## 2016-01-18 ENCOUNTER — Emergency Department: Payer: BLUE CROSS/BLUE SHIELD

## 2016-01-18 ENCOUNTER — Emergency Department
Admission: EM | Admit: 2016-01-18 | Discharge: 2016-01-18 | Disposition: A | Discharge status: Home / Self Care | Payer: BLUE CROSS/BLUE SHIELD | Attending: Emergency Medicine | Admitting: Emergency Medicine

## 2016-01-18 DIAGNOSIS — Y999 Unspecified external cause status: Secondary | ICD-10-CM | POA: Insufficient documentation

## 2016-01-18 DIAGNOSIS — Y92410 Unspecified street and highway as the place of occurrence of the external cause: Secondary | ICD-10-CM | POA: Diagnosis not present

## 2016-01-18 DIAGNOSIS — Y939 Activity, unspecified: Secondary | ICD-10-CM | POA: Insufficient documentation

## 2016-01-18 DIAGNOSIS — Z87891 Personal history of nicotine dependence: Secondary | ICD-10-CM | POA: Insufficient documentation

## 2016-01-18 DIAGNOSIS — Z79899 Other long term (current) drug therapy: Secondary | ICD-10-CM | POA: Diagnosis not present

## 2016-01-18 DIAGNOSIS — S299XXA Unspecified injury of thorax, initial encounter: Secondary | ICD-10-CM | POA: Diagnosis present

## 2016-01-18 DIAGNOSIS — S29012A Strain of muscle and tendon of back wall of thorax, initial encounter: Secondary | ICD-10-CM | POA: Insufficient documentation

## 2016-01-18 DIAGNOSIS — T148XXA Other injury of unspecified body region, initial encounter: Secondary | ICD-10-CM

## 2016-01-18 MED ORDER — NAPROXEN 500 MG PO TABS: 500.0000 mg | ORAL_TABLET | Freq: Two times a day (BID) | ORAL | 0 refills | Status: AC
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
## 2016-01-26 ENCOUNTER — Encounter: Payer: BLUE CROSS/BLUE SHIELD | Admitting: Obstetrics and Gynecology

## 2016-02-08 ENCOUNTER — Ambulatory Visit: Payer: BLUE CROSS/BLUE SHIELD | Admitting: Podiatry

## 2016-03-06 ENCOUNTER — Emergency Department
Admission: EM | Admit: 2016-03-06 | Discharge: 2016-03-06 | Disposition: A | Discharge status: Home / Self Care | Payer: BLUE CROSS/BLUE SHIELD | Attending: Emergency Medicine | Admitting: Emergency Medicine

## 2016-03-06 ENCOUNTER — Encounter: Payer: Self-pay | Admitting: Emergency Medicine

## 2016-03-06 DIAGNOSIS — Z5321 Procedure and treatment not carried out due to patient leaving prior to being seen by health care provider: Secondary | ICD-10-CM | POA: Insufficient documentation

## 2016-03-06 DIAGNOSIS — R200 Anesthesia of skin: Secondary | ICD-10-CM | POA: Insufficient documentation

## 2016-03-06 DIAGNOSIS — Z87891 Personal history of nicotine dependence: Secondary | ICD-10-CM | POA: Diagnosis not present

## 2016-03-06 NOTE — ED Triage Notes (Addendum)
Pt reports intermittent numbness in left arm and hand for two to three weeks. Pt reports pain in her left elbow and shoulder. Pt reports history of arthritis. Pt denies chest pain. No apparent distress noted

## 2016-03-06 NOTE — ED Notes (Signed)
See triage note  States she has had intermittent to left arm which moves into hand for the past 3 weeks  Denies any injury  Hx of arthritis  Also having pain to shoulder

## 2016-03-10 ENCOUNTER — Ambulatory Visit (INDEPENDENT_AMBULATORY_CARE_PROVIDER_SITE_OTHER): Payer: BLUE CROSS/BLUE SHIELD | Admitting: Podiatry

## 2016-03-10 ENCOUNTER — Encounter: Payer: Self-pay | Admitting: Podiatry

## 2016-03-10 DIAGNOSIS — M79609 Pain in unspecified limb: Secondary | ICD-10-CM | POA: Diagnosis not present

## 2016-03-10 DIAGNOSIS — L603 Nail dystrophy: Secondary | ICD-10-CM

## 2016-03-10 DIAGNOSIS — B351 Tinea unguium: Secondary | ICD-10-CM | POA: Diagnosis not present

## 2016-03-10 DIAGNOSIS — L608 Other nail disorders: Secondary | ICD-10-CM

## 2016-03-10 NOTE — Progress Notes (Signed)
   SUBJECTIVE Patient  presents to office today complaining of elongated, thickened nails. Pain while ambulating in shoes. Patient is unable to trim their own nails.   OBJECTIVE General Patient is awake, alert, and oriented x 3 and in no acute distress. Derm Skin is dry and supple bilateral. Negative open lesions or macerations. Remaining integument unremarkable. Nails are tender, long, thickened and dystrophic with subungual debris, consistent with onychomycosis, 1-5 bilateral. No signs of infection noted. Vasc  DP and PT pedal pulses palpable bilaterally. Temperature gradient within normal limits.  Neuro Epicritic and protective threshold sensation diminished bilaterally.  Musculoskeletal Exam No symptomatic pedal deformities noted bilateral. Muscular strength within normal limits.  ASSESSMENT 1. Onychodystrophic nails 1-5 bilateral with hyperkeratosis of nails.  2. Onychomycosis of nail due to dermatophyte bilateral 3. Pain in foot bilateral  PLAN OF CARE 1. Patient evaluated today.  2. Instructed to maintain good pedal hygiene and foot care.  3. Mechanical debridement of nails 1-5 bilaterally performed using a nail nipper. Filed with dremel without incident.  4. Return to clinic in 3 mos.    Brent M. Evans, DPM [facility]  Dr. Brent M. Evans, DPM    [address]                                        [place], [place] [address]                Office [phone]  Fax [phone]      

## 2016-03-19 ENCOUNTER — Emergency Department: Payer: BLUE CROSS/BLUE SHIELD

## 2016-03-19 DIAGNOSIS — G8929 Other chronic pain: Secondary | ICD-10-CM | POA: Insufficient documentation

## 2016-03-19 DIAGNOSIS — Z79899 Other long term (current) drug therapy: Secondary | ICD-10-CM | POA: Diagnosis not present

## 2016-03-19 DIAGNOSIS — M546 Pain in thoracic spine: Secondary | ICD-10-CM | POA: Diagnosis not present

## 2016-03-19 DIAGNOSIS — Z87891 Personal history of nicotine dependence: Secondary | ICD-10-CM | POA: Diagnosis not present

## 2016-03-19 DIAGNOSIS — R079 Chest pain, unspecified: Secondary | ICD-10-CM | POA: Diagnosis not present

## 2016-03-19 DIAGNOSIS — I10 Essential (primary) hypertension: Secondary | ICD-10-CM | POA: Diagnosis not present

## 2016-03-19 LAB — BASIC METABOLIC PANEL
Anion gap: 8 (ref 5–15)
BUN: 16 mg/dL (ref 6–20)
CHLORIDE: 105 mmol/L (ref 101–111)
CO2: 24 mmol/L (ref 22–32)
Calcium: 9.4 mg/dL (ref 8.9–10.3)
Creatinine, Ser: 0.75 mg/dL (ref 0.44–1.00)
GFR calc Af Amer: >60 mL/min (ref >60)
GLUCOSE: 99 mg/dL (ref 65–99)
POTASSIUM: 3.8 mmol/L (ref 3.5–5.1)
Sodium: 137 mmol/L (ref 135–145)

## 2016-03-19 LAB — CBC
HEMATOCRIT: 38.5 % (ref 35.0–47.0)
Hemoglobin: 13.6 g/dL (ref 12.0–16.0)
MCH: 30 pg (ref 26.0–34.0)
MCHC: 35.2 g/dL (ref 32.0–36.0)
MCV: 85.3 fL (ref 80.0–100.0)
Platelets: 233 10*3/uL (ref 150–440)
RBC: 4.52 MIL/uL (ref 3.80–5.20)
RDW: 13.3 % (ref 11.5–14.5)
WBC: 6.1 10*3/uL (ref 3.6–11.0)

## 2016-03-19 LAB — TROPONIN I: Troponin I: <0.03 ng/mL (ref <0.03)

## 2016-03-19 NOTE — ED Triage Notes (Signed)
Pt states has no chest pain currently.

## 2016-03-19 NOTE — ED Triage Notes (Signed)
Pt states intermittent left upper back pain with left arm pain and posterior neck pain for 4 weeks. Pt states pain is now accompanied by left sided chest pain. Pt denies shob, nausea, vomiting. Pt states chest pain began "on my way home from work tonight."

## 2016-03-20 ENCOUNTER — Emergency Department
Admission: EM | Admit: 2016-03-20 | Discharge: 2016-03-20 | Disposition: A | Discharge status: Home / Self Care | Payer: BLUE CROSS/BLUE SHIELD | Attending: Emergency Medicine | Admitting: Emergency Medicine

## 2016-03-20 DIAGNOSIS — G8929 Other chronic pain: Secondary | ICD-10-CM

## 2016-03-20 DIAGNOSIS — R079 Chest pain, unspecified: Secondary | ICD-10-CM

## 2016-03-20 DIAGNOSIS — M546 Pain in thoracic spine: Secondary | ICD-10-CM

## 2016-03-20 LAB — TROPONIN I

## 2016-03-20 MED ORDER — GI COCKTAIL ~~LOC~~
30.0000 mL | Freq: Once | ORAL | Status: AC
  Administered 2016-03-20: 30 mL via ORAL
  Filled 2016-03-20: qty 30

## 2016-03-20 MED ORDER — LIDOCAINE 5 % EX PTCH: 1.0000 | MEDICATED_PATCH | Freq: Two times a day (BID) | CUTANEOUS | 0 refills | Status: AC

## 2016-03-20 MED ORDER — KETOROLAC TROMETHAMINE 60 MG/2ML IM SOLN
60.0000 mg | Freq: Once | INTRAMUSCULAR | Status: AC
  Administered 2016-03-20: 60 mg via INTRAMUSCULAR
  Filled 2016-03-20: qty 2

## 2016-03-20 MED ORDER — LIDOCAINE 5 % EX PTCH
1.0000 | MEDICATED_PATCH | CUTANEOUS | Status: DC
  Administered 2016-03-20: 1 via TRANSDERMAL
  Filled 2016-03-20: qty 1

## 2016-03-20 MED ORDER — ETODOLAC 200 MG PO CAPS: 200.0000 mg | ORAL_CAPSULE | Freq: Three times a day (TID) | ORAL | 0 refills | Status: DC

## 2016-03-20 NOTE — ED Notes (Signed)
Pt awakened easily when entered room; rates pain 8/10; side rails up x 2; pt says she's slept well since she got into the room and is pretty well rested for work today; talking in complete coherent sentences;

## 2016-03-20 NOTE — ED Notes (Signed)
Dr webster at bedside 
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
## 2016-03-22 ENCOUNTER — Encounter: Payer: Self-pay | Admitting: Family Medicine

## 2016-03-22 ENCOUNTER — Ambulatory Visit (INDEPENDENT_AMBULATORY_CARE_PROVIDER_SITE_OTHER): Payer: BLUE CROSS/BLUE SHIELD | Admitting: Family Medicine

## 2016-03-22 DIAGNOSIS — M549 Dorsalgia, unspecified: Secondary | ICD-10-CM | POA: Diagnosis not present

## 2016-03-22 DIAGNOSIS — R079 Chest pain, unspecified: Secondary | ICD-10-CM

## 2016-03-22 MED ORDER — ETODOLAC 200 MG PO CAPS: 200.0000 mg | ORAL_CAPSULE | Freq: Three times a day (TID) | ORAL | 0 refills | Status: DC

## 2016-03-22 NOTE — Progress Notes (Signed)
Name: Brenda Ellison   MRN: [id number]    DOB: 04/12/1960   Date:03/22/2016       Progress Note  Subjective  Chief Complaint  Chief Complaint  Patient presents with  . Hospitalization Follow-up    chest pain    HPI  Pt. Presents for ER follow up, was seen last weekend for onset of left side chest pain and upper back pain after she got off from work. She was seen and evaluated at the ER and had a normal EKG, normal cardiac enzymes. She was started on Lidoderm patch and Etodolac which seems to help relieve her pain. Patient has no history of heart disease but has seen a Cardiologist in 2016.   Past Medical History:  Diagnosis Date  . Arthritis pain   . Bilateral leg pain   . Chronic nasal congestion   . Dizziness of unknown cause   . Heart murmur previously undiagnosed   . Osteoarthritis, hand     History reviewed. No pertinent surgical history.  History reviewed. No pertinent family history.  Social History   Social History  . Marital status: Single    Spouse name: N/A  . Number of children: N/A  . Years of education: N/A   Occupational History  . Not on file.   Social History Main Topics  . Smoking status: Former Games developermoker  . Smokeless tobacco: Never Used  . Alcohol use No  . Drug use: No  . Sexual activity: Yes   Other Topics Concern  . Not on file   Social History Narrative   Widowed   Works full time   Carbonated beverages     Current Outpatient Prescriptions:  .  Cholecalciferol (VITAMIN D3) 2000 UNITS capsule, Take by mouth., Disp: , Rfl:  .  etodolac (LODINE) 200 MG capsule, Take 1 capsule (200 mg total) by mouth every 8 (eight) hours., Disp: 12 capsule, Rfl: 0 .  lidocaine (LIDODERM) 5 %, Place 1 patch onto the skin every 12 (twelve) hours. Remove & Discard patch within 12 hours or as directed by MD, Disp: 10 patch, Rfl: 0 .  magic mouthwash w/lidocaine SOLN, Take 5 mLs by mouth 4 (four) times daily., Disp: 240 mL, Rfl: 0 .  amoxicillin (AMOXIL) 875  MG tablet, Take 1 tablet (875 mg total) by mouth 2 (two) times daily. (Patient not taking: Reported on 03/22/2016), Disp: 14 tablet, Rfl: 0 .  doxycycline (VIBRA-TABS) 100 MG tablet, , Disp: , Rfl: 0  Allergies  Allergen Reactions  . Tramadol Rash     Review of Systems  Constitutional: Negative for chills and fever.  Respiratory: Negative for cough, sputum production and shortness of breath.   Cardiovascular: Negative for chest pain.  Gastrointestinal: Negative for abdominal pain, nausea and vomiting.  Musculoskeletal: Positive for back pain and myalgias.    Objective  Vitals:   03/22/16 1032  BP: 130/69  Pulse: 81  Resp: 16  Temp: 97.7 F (36.5 C)  TempSrc: Oral  SpO2: 96%  Weight: 166 lb 4.8 oz (75.4 kg)  Height: 5\' 4"  (1.626 m)    Physical Exam  Constitutional: She is well-developed, well-nourished, and in no distress.  Cardiovascular: Normal rate, regular rhythm, S1 normal and S2 normal.   Murmur heard.  Systolic murmur is present with a grade of 3/6  Pulmonary/Chest: Effort normal. No respiratory distress. She has no decreased breath sounds. She has no wheezes. She has no rhonchi.  Musculoskeletal:       Cervical back: She exhibits  tenderness and pain. She exhibits no spasm.       Back:  Tenderness to palpation over the left upper back and shoulder  Psychiatric: Mood, memory, affect and judgment normal.  Nursing note and vitals reviewed.      Assessment & Plan  1. Chest pain, unspecified type Likely costochondritis given her presentation and the fact that it is improved with Lidoderm patch and NSAID therapy, EKG and other pertinent workup reviewed from the ER. Patient is usually followed by cardiology although has not seen them in the last year. Recommended to use Etodolac as prescribed, may decide to seek consultation with cardiology  2. Upper back pain on left side Appears to be musculoskeletal in etiology, to continue taking etodolac as needed. -  etodolac (LODINE) 200 MG capsule; Take 1 capsule (200 mg total) by mouth every 8 (eight) hours.  Dispense: 15 capsule; Refill: 0   Evolett Somarriba Asad A. [facility] 03/22/2016 10:49 AM

## 2016-03-28 ENCOUNTER — Encounter: Payer: Self-pay | Admitting: Obstetrics and Gynecology

## 2016-03-28 ENCOUNTER — Ambulatory Visit (INDEPENDENT_AMBULATORY_CARE_PROVIDER_SITE_OTHER): Payer: BLUE CROSS/BLUE SHIELD | Admitting: Obstetrics and Gynecology

## 2016-03-28 VITALS — BP 119/71 | HR 60 | Ht 64.0 in | Wt 167.8 lb

## 2016-03-28 DIAGNOSIS — Z124 Encounter for screening for malignant neoplasm of cervix: Secondary | ICD-10-CM

## 2016-03-28 DIAGNOSIS — Z780 Asymptomatic menopausal state: Secondary | ICD-10-CM

## 2016-03-28 DIAGNOSIS — Z01419 Encounter for gynecological examination (general) (routine) without abnormal findings: Secondary | ICD-10-CM | POA: Diagnosis not present

## 2016-03-28 DIAGNOSIS — Z1211 Encounter for screening for malignant neoplasm of colon: Secondary | ICD-10-CM | POA: Diagnosis not present

## 2016-03-28 DIAGNOSIS — Z1239 Encounter for other screening for malignant neoplasm of breast: Secondary | ICD-10-CM

## 2016-03-28 DIAGNOSIS — Z1231 Encounter for screening mammogram for malignant neoplasm of breast: Secondary | ICD-10-CM | POA: Diagnosis not present

## 2016-03-28 NOTE — Patient Instructions (Signed)
1. Pap smear is performed 2. Mammogram is ordered 3. Stool guaiac cards are given for colon cancer screening 4. Continue with healthy eating and exercise 5. Continue with vitamin D supplementation. Recommend a trial of Tums for calcium supplementation 6. Recommend lubricants for intercourse:  K-Y jelly  Olive oil or Coconut oil oil 7. Return in 1 year    Health Maintenance for Postmenopausal Women Introduction Menopause is a normal process in which your reproductive ability comes to an end. This process happens gradually over a span of months to years, usually between the ages of 31 and 24. Menopause is complete when you have missed 12 consecutive menstrual periods. It is important to talk with your health care provider about some of the most common conditions that affect postmenopausal women, such as heart disease, cancer, and bone loss (osteoporosis). Adopting a healthy lifestyle and getting preventive care can help to promote your health and wellness. Those actions can also lower your chances of developing some of these common conditions. What should I know about menopause? During menopause, you may experience a number of symptoms, such as:  Moderate-to-severe hot flashes.  Night sweats.  Decrease in sex drive.  Mood swings.  Headaches.  Tiredness.  Irritability.  Memory problems.  Insomnia. Choosing to treat or not to treat menopausal changes is an individual decision that you make with your health care provider. What should I know about hormone replacement therapy and supplements? Hormone therapy products are effective for treating symptoms that are associated with menopause, such as hot flashes and night sweats. Hormone replacement carries certain risks, especially as you become older. If you are thinking about using estrogen or estrogen with progestin treatments, discuss the benefits and risks with your health care provider. What should I know about heart disease and  stroke? Heart disease, heart attack, and stroke become more likely as you age. This may be due, in part, to the hormonal changes that your body experiences during menopause. These can affect how your body processes dietary fats, triglycerides, and cholesterol. Heart attack and stroke are both medical emergencies. There are many things that you can do to help prevent heart disease and stroke:  Have your blood pressure checked at least every 1-2 years. High blood pressure causes heart disease and increases the risk of stroke.  If you are 76-6 years old, ask your health care provider if you should take aspirin to prevent a heart attack or a stroke.  Do not use any tobacco products, including cigarettes, chewing tobacco, or electronic cigarettes. If you need help quitting, ask your health care provider.  It is important to eat a healthy diet and maintain a healthy weight.  Be sure to include plenty of vegetables, fruits, low-fat dairy products, and lean protein.  Avoid eating foods that are high in solid fats, added sugars, or salt (sodium).  Get regular exercise. This is one of the most important things that you can do for your health.  Try to exercise for at least 150 minutes each week. The type of exercise that you do should increase your heart rate and make you sweat. This is known as moderate-intensity exercise.  Try to do strengthening exercises at least twice each week. Do these in addition to the moderate-intensity exercise.  Know your numbers.Ask your health care provider to check your cholesterol and your blood glucose. Continue to have your blood tested as directed by your health care provider. What should I know about cancer screening? There are several types of  cancer. Take the following steps to reduce your risk and to catch any cancer development as early as possible. Breast Cancer  Practice breast self-awareness.  This means understanding how your breasts normally appear  and feel.  It also means doing regular breast self-exams. Let your health care provider know about any changes, no matter how small.  If you are 53 or older, have a clinician do a breast exam (clinical breast exam or CBE) every year. Depending on your age, family history, and medical history, it may be recommended that you also have a yearly breast X-ray (mammogram).  If you have a family history of breast cancer, talk with your health care provider about genetic screening.  If you are at high risk for breast cancer, talk with your health care provider about having an MRI and a mammogram every year.  Breast cancer (BRCA) gene test is recommended for women who have family members with BRCA-related cancers. Results of the assessment will determine the need for genetic counseling and BRCA1 and for BRCA2 testing. BRCA-related cancers include these types:  Breast. This occurs in males or females.  Ovarian.  Tubal. This may also be called fallopian tube cancer.  Cancer of the abdominal or pelvic lining (peritoneal cancer).  Prostate.  Pancreatic. Cervical, Uterine, and Ovarian Cancer  Your health care provider may recommend that you be screened regularly for cancer of the pelvic organs. These include your ovaries, uterus, and vagina. This screening involves a pelvic exam, which includes checking for microscopic changes to the surface of your cervix (Pap test).  For women ages 21-65, health care providers may recommend a pelvic exam and a Pap test every three years. For women ages 42-65, they may recommend the Pap test and pelvic exam, combined with testing for human papilloma virus (HPV), every five years. Some types of HPV increase your risk of cervical cancer. Testing for HPV may also be done on women of any age who have unclear Pap test results.  Other health care providers may not recommend any screening for nonpregnant women who are considered low risk for pelvic cancer and have no  symptoms. Ask your health care provider if a screening pelvic exam is right for you.  If you have had past treatment for cervical cancer or a condition that could lead to cancer, you need Pap tests and screening for cancer for at least 20 years after your treatment. If Pap tests have been discontinued for you, your risk factors (such as having a new sexual partner) need to be reassessed to determine if you should start having screenings again. Some women have medical problems that increase the chance of getting cervical cancer. In these cases, your health care provider may recommend that you have screening and Pap tests more often.  If you have a family history of uterine cancer or ovarian cancer, talk with your health care provider about genetic screening.  If you have vaginal bleeding after reaching menopause, tell your health care provider.  There are currently no reliable tests available to screen for ovarian cancer. Lung Cancer  Lung cancer screening is recommended for adults 71-72 years old who are at high risk for lung cancer because of a history of smoking. A yearly low-dose CT scan of the lungs is recommended if you:  Currently smoke.  Have a history of at least 30 pack-years of smoking and you currently smoke or have quit within the past 15 years. A pack-year is smoking an average of one pack of  cigarettes per day for one year. Yearly screening should:  Continue until it has been 15 years since you quit.  Stop if you develop a health problem that would prevent you from having lung cancer treatment. Colorectal Cancer  This type of cancer can be detected and can often be prevented.  Routine colorectal cancer screening usually begins at age 63 and continues through age 14.  If you have risk factors for colon cancer, your health care provider may recommend that you be screened at an earlier age.  If you have a family history of colorectal cancer, talk with your health care provider  about genetic screening.  Your health care provider may also recommend using home test kits to check for hidden blood in your stool.  A small camera at the end of a tube can be used to examine your colon directly (sigmoidoscopy or colonoscopy). This is done to check for the earliest forms of colorectal cancer.  Direct examination of the colon should be repeated every 5-10 years until age 3. However, if early forms of precancerous polyps or small growths are found or if you have a family history or genetic risk for colorectal cancer, you may need to be screened more often. Skin Cancer  Check your skin from head to toe regularly.  Monitor any moles. Be sure to tell your health care provider:  About any new moles or changes in moles, especially if there is a change in a mole's shape or color.  If you have a mole that is larger than the size of a pencil eraser.  If any of your family members has a history of skin cancer, especially at a young age, talk with your health care provider about genetic screening.  Always use sunscreen. Apply sunscreen liberally and repeatedly throughout the day.  Whenever you are outside, protect yourself by wearing long sleeves, pants, a wide-brimmed hat, and sunglasses. What should I know about osteoporosis? Osteoporosis is a condition in which bone destruction happens more quickly than new bone creation. After menopause, you may be at an increased risk for osteoporosis. To help prevent osteoporosis or the bone fractures that can happen because of osteoporosis, the following is recommended:  If you are 58-58 years old, get at least 1,000 mg of calcium and at least 600 mg of vitamin D per day.  If you are older than age 81 but younger than age 36, get at least 1,200 mg of calcium and at least 600 mg of vitamin D per day.  If you are older than age 14, get at least 1,200 mg of calcium and at least 800 mg of vitamin D per day. Smoking and excessive alcohol intake  increase the risk of osteoporosis. Eat foods that are rich in calcium and vitamin D, and do weight-bearing exercises several times each week as directed by your health care provider. What should I know about how menopause affects my mental health? Depression may occur at any age, but it is more common as you become older. Common symptoms of depression include:  Low or sad mood.  Changes in sleep patterns.  Changes in appetite or eating patterns.  Feeling an overall lack of motivation or enjoyment of activities that you previously enjoyed.  Frequent crying spells. Talk with your health care provider if you think that you are experiencing depression. What should I know about immunizations? It is important that you get and maintain your immunizations. These include:  Tetanus, diphtheria, and pertussis (Tdap) booster vaccine.  Influenza every year before the flu season begins.  Pneumonia vaccine.  Shingles vaccine. Your health care provider may also recommend other immunizations. This information is not intended to replace advice given to you by your health care provider. Make sure you discuss any questions you have with your health care provider. Document Released: 03/31/2005 Document Revised: 08/27/2015 Document Reviewed: 11/10/2014  2017 Elsevier

## 2016-03-28 NOTE — Progress Notes (Signed)
ANNUAL PREVENTATIVE CARE GYN  ENCOUNTER NOTE    Subjective:       Brenda Ellison is a 56 y.o. G79P3003 female here for a routine annual gynecologic exam.  Current complaints: 1.  Well woman annual exam   Patient is a postmenopausal Caucasian female that presents for her annual exam. Pt. does not report any new vaginal discharge, vaginal or pelvic pain, or postmenopausal sx.  Infrequently sexually active with multiple partners but without any protection or contraception.  Gynecologic History No LMP recorded. Patient is postmenopausal. Contraception: none Sexually active but no current method of protection  Last Pap: 10/2014 reflex neg. Results were: normal Last mammogram: 03/12/2014 birad 1. Results were: normal  Obstetric History OB History  Gravida Para Term Preterm AB Living  3 3 3     3   SAB TAB Ectopic Multiple Live Births          3    # Outcome Date GA Lbr Len/2nd Weight Sex Delivery Anes PTL Lv  3 Term 1987   8 lb 4.8 oz (3.765 kg) M Vag-Spont   LIV  2 Term 1985   6 lb 11.2 oz (3.039 kg) M Vag-Spont   LIV  1 Term 1983   6 lb 11.2 oz (3.039 kg) M Vag-Spont   LIV      Past Medical History:  Diagnosis Date  . Arthritis pain   . Bilateral leg pain   . Chronic nasal congestion   . Dizziness of unknown cause   . Heart murmur previously undiagnosed   . Osteoarthritis, hand     Past Surgical History:  Procedure Laterality Date  . NO PAST SURGERIES      Current Outpatient Prescriptions on File Prior to Visit  Medication Sig Dispense Refill  . Cholecalciferol (VITAMIN D3) 2000 UNITS capsule Take by mouth.    . etodolac (LODINE) 200 MG capsule Take 1 capsule (200 mg total) by mouth every 8 (eight) hours. 15 capsule 0  . lidocaine (LIDODERM) 5 % Place 1 patch onto the skin every 12 (twelve) hours. Remove & Discard patch within 12 hours or as directed by MD 10 patch 0  . magic mouthwash w/lidocaine SOLN Take 5 mLs by mouth 4 (four) times daily. 240 mL 0   No current  facility-administered medications on file prior to visit.     Allergies  Allergen Reactions  . Tramadol Rash    Social History   Social History  . Marital status: Single    Spouse name: N/A  . Number of children: N/A  . Years of education: N/A   Occupational History  . Not on file.   Social History Main Topics  . Smoking status: Former Games developer  . Smokeless tobacco: Never Used  . Alcohol use No  . Drug use: No  . Sexual activity: Yes   Other Topics Concern  . Not on file   Social History Narrative   Widowed   Works full time   Carbonated beverages    No family history on file.  The following portions of the patient's history were reviewed and updated as appropriate: allergies, current medications, past family history, past medical history, past social history, past surgical history and problem list.  Review of Systems ROS Review of Systems - General ROS: negative for - chills, fatigue, fever, hot flashes, night sweats, weight gain or weight loss Psychological ROS: negative for - anxiety, decreased libido, depression, mood swings, physical abuse or sexual abuse Ophthalmic ROS: negative for -  blurry vision, eye pain or loss of vision ENT ROS: negative for - headaches, hearing change, visual changes or vocal changes Allergy and Immunology ROS: negative for - hives, itchy/watery eyes or seasonal allergies Hematological and Lymphatic ROS: negative for - bleeding problems, bruising, swollen lymph nodes or weight loss Endocrine ROS: negative for - galactorrhea, hot flashes, malaise/lethargy, mood swings, palpitations, polydipsia/polyuria, skin changes, temperature intolerance or unexpected weight changes Breast ROS: negative for - new or changing breast lumps or nipple discharge Respiratory ROS: negative for - cough or shortness of breath Cardiovascular ROS: negative for - chest pain, + slight heart murmur noted with bell, rrr, negative for palpitations or shortness of  breath Gastrointestinal ROS: no abdominal pain, change in bowel habits, or black or bloody stools. Does report "wormy" bowel movements. Genito-Urinary ROS: no dysuria, trouble voiding, or hematuria Musculoskeletal ROS: + joint pain and stiffness of left arm. Numbness of left hand extended into the 4th and 5th digit. + cervical spine / muscular pain that extends into left arm.  Neurological ROS: negative for - bowel and bladder control changes Dermatological ROS: negative for new rash and skin lesion changes   Objective:   BP 119/71   Pulse 60   Ht 5\' 4"  (1.626 m)   Wt 167 lb 12.8 oz (76.1 kg)   BMI 28.80 kg/m  CONSTITUTIONAL: Well-developed, well-nourished female in no acute distress.  PSYCHIATRIC: Normal mood and affect. Normal behavior. Normal judgment and thought content. NEUROLGIC: Alert and oriented to person, place, and time. Normal muscle tone coordination. No cranial nerve deficit noted. HENT:  Normocephalic, atraumatic, External right and left ear normal. Oropharynx is clear and moist EYES: Conjunctivae and EOM are normal. Pupils are equal, round, and reactive to light. No scleral icterus.  NECK: Normal range of motion, supple, no masses.  Normal thyroid.  SKIN: Skin is warm and dry. No rash noted. Some skin changes noted left axillary and below the breast but are not new to patient. Not diaphoretic. No erythema. No pallor. CARDIOVASCULAR: Normal heart rate noted, regular rhythm, slight murmur,Low pitch RESPIRATORY: Clear to auscultation bilaterally. Effort and breath sounds normal, no problems with respiration noted. BREASTS: Symmetric in size. No masses, skin changes, nipple drainage, or lymphadenopathy. ABDOMEN: Soft, normal bowel and percussion sounds, no distention noted.  No tenderness, rebound or guarding.  BLADDER: Normal PELVIC:  External Genitalia: Normal.  BUS: Normal  Vagina: Signs of vaginal atrophy d/t decreased estrogen effect  Cervix: Normal; no cervical motion  tenderness   Uterus: Normal; midplane, normal size and shape, mobile  Adnexa: Normal; nonpalpable and nontender  RV: External Exam NormaI, No Rectal Masses and Normal Sphincter tone  MUSCULOSKELETAL: Normal range of motion. No tenderness.  No cyanosis, clubbing, or edema.  2+ distal pulses. Some numbness in L 4th and 5th digit possibly d/t ulnar irritation with muscle tension from arthritis.  LYMPHATIC: No Axillary, Supraclavicular, or Inguinal Adenopathy.    Assessment:   1. Annual gynecologic examination 56 y.o. post menopausal G70P3003 Caucasian female sexually active without protection.  2. Pap performed 3. Contraception: none and infrequently sexually active with different partners. 4. Overweight 5. Menopause 6. Vaginal atrophy, minimally symptomatic  Plan:  1. Pap: Pap Co Test 2. Mammogram: Ordered 3. Stool Guaiac Testing:  Ordered 4. Labs: thru pcp 5. Routine preventative health maintenance measures emphasized: Tobacco Warnings, Stress Management, Peer Pressure Issues and Safe Sex. Education also provided for postmenopausal health maintenance and osteoporosis awareness and prevention. Information also provided for cervical, uterine, lung, colorectal,  skin and ovarian CA education.  6. Lubricants as needed for vaginal atrophy Return to Clinic - 1 Year  Marisue IvanJacquelyn Visser, PA-S Darol Destinerystal Miller, CMA Herold HarmsMartin A Benjamim Harnish, MD   I have seen, interviewed, and examined the patient in conjunction with the [facility] [facility] and affirm the diagnosis and management plan. Izzy Courville A. Meldon Hanzlik, MD, FACOG   Note: This dictation was prepared with Dragon dictation along with smaller phrase technology. Any transcriptional errors that result from this process are unintentional.

## 2016-03-29 ENCOUNTER — Telehealth: Payer: Self-pay | Admitting: Obstetrics and Gynecology

## 2016-03-30 ENCOUNTER — Telehealth: Payer: Self-pay | Admitting: Obstetrics and Gynecology

## 2016-03-30 NOTE — Telephone Encounter (Signed)
Pt called and left VM on office machine stating she would like a call back from a nurse she had a question.

## 2016-03-30 NOTE — Telephone Encounter (Signed)
Pt aware per mad to take 600mg  calcium with vit d twice a day. Recommend oscal.

## 2016-04-01 LAB — PAP IG AND HPV HIGH-RISK
HPV, high-risk: NEGATIVE
PAP SMEAR COMMENT: 0

## 2016-04-07 ENCOUNTER — Ambulatory Visit
Admission: RE | Admit: 2016-04-07 | Discharge: 2016-04-07 | Disposition: A | Discharge status: Home / Self Care | Payer: BLUE CROSS/BLUE SHIELD | Source: Ambulatory Visit | Attending: Obstetrics and Gynecology | Admitting: Obstetrics and Gynecology

## 2016-04-07 DIAGNOSIS — Z1239 Encounter for other screening for malignant neoplasm of breast: Secondary | ICD-10-CM

## 2016-04-07 DIAGNOSIS — Z1231 Encounter for screening mammogram for malignant neoplasm of breast: Secondary | ICD-10-CM | POA: Diagnosis not present

## 2016-04-17 NOTE — Telephone Encounter (Signed)
error 

## 2016-04-26 DIAGNOSIS — Q667 Congenital pes cavus, unspecified foot: Secondary | ICD-10-CM | POA: Insufficient documentation

## 2016-06-12 ENCOUNTER — Ambulatory Visit (INDEPENDENT_AMBULATORY_CARE_PROVIDER_SITE_OTHER): Payer: BLUE CROSS/BLUE SHIELD | Admitting: Podiatry

## 2016-06-12 DIAGNOSIS — M79609 Pain in unspecified limb: Secondary | ICD-10-CM | POA: Diagnosis not present

## 2016-06-12 DIAGNOSIS — B351 Tinea unguium: Secondary | ICD-10-CM | POA: Diagnosis not present

## 2016-06-12 DIAGNOSIS — M898X9 Other specified disorders of bone, unspecified site: Secondary | ICD-10-CM

## 2016-06-12 NOTE — Progress Notes (Signed)
Complaint:  Visit Type: Patient returns to my office for continued preventative foot care services. Complaint: Patient states" my nails have grown long and thick and become painful to walk and wear shoes" . The patient presents for preventative foot care services. No changes to ROS  Podiatric Exam: Vascular: dorsalis pedis and posterior tibial pulses are palpable bilateral. Capillary return is immediate. Temperature gradient is WNL. Skin turgor WNL  Sensorium: Normal Semmes Weinstein monofilament test. Normal tactile sensation bilaterally. Nail Exam: Pt has thick disfigured discolored nails with subungual debris noted bilateral entire nail hallux through fifth toenails Ulcer Exam: There is no evidence of ulcer or pre-ulcerative changes or infection. Orthopedic Exam: Muscle tone and strength are WNL. No limitations in general ROM. No crepitus or effusions noted. Foot type and digits show no abnormalities. Bony prominences are unremarkable. 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 Skin: No Porokeratosis. No infection or ulcers  Diagnosis:  Onychomycosis, , Pain in right toe, pain in left toes  Treatment & Plan Procedures and Treatment: Consent by patient was obtained for treatment procedures. The patient understood the discussion of treatment and procedures well. All questions were answered thoroughly reviewed. Debridement of mycotic and hypertrophic toenails, 1 through 5 bilateral and clearing of subungual debris. No ulceration, no infection noted.  Return Visit-Office Procedure: Patient instructed to return to the office for a follow up visit 3 months for continued evaluation and treatment.    Giavonni Cizek DPM 

## 2016-06-27 ENCOUNTER — Encounter: Payer: Self-pay | Admitting: Family Medicine

## 2016-06-27 ENCOUNTER — Ambulatory Visit (INDEPENDENT_AMBULATORY_CARE_PROVIDER_SITE_OTHER): Payer: BLUE CROSS/BLUE SHIELD | Admitting: Family Medicine

## 2016-06-27 VITALS — BP 120/77 | HR 73 | Temp 97.7°F | Resp 16 | Ht 64.0 in | Wt 163.1 lb

## 2016-06-27 DIAGNOSIS — N3001 Acute cystitis with hematuria: Secondary | ICD-10-CM | POA: Diagnosis not present

## 2016-06-27 LAB — POCT URINALYSIS DIPSTICK
BILIRUBIN UA: NEGATIVE
Glucose, UA: NEGATIVE
KETONES UA: NEGATIVE
Leukocytes, UA: NEGATIVE
Nitrite, UA: NEGATIVE
SPEC GRAV UA: 1.025 (ref 1.010–1.025)
Urobilinogen, UA: 0.2 E.U./dL
pH, UA: 5 (ref 5.0–8.0)

## 2016-06-27 MED ORDER — NITROFURANTOIN MONOHYD MACRO 100 MG PO CAPS: 100.0000 mg | ORAL_CAPSULE | Freq: Two times a day (BID) | ORAL | 0 refills | Status: AC

## 2016-06-27 NOTE — Progress Notes (Signed)
Name: Collene MaresMary A Barnette   MRN: [id number]    DOB: 07/07/1960   Date:06/27/2016       Progress Note  Subjective  Chief Complaint  Chief Complaint  Patient presents with  . Urinary Frequency    Urinary Frequency   This is a new problem. The current episode started yesterday. The quality of the pain is described as burning. There has been no fever. She is sexually active. There is no history of pyelonephritis. Associated symptoms include frequency. Pertinent negatives include no chills, hematuria, nausea, urgency or vomiting. She has tried nothing for the symptoms. The treatment provided no relief.     Past Medical History:  Diagnosis Date  . Arthritis pain   . Bilateral leg pain   . Chronic nasal congestion   . Dizziness of unknown cause   . Heart murmur previously undiagnosed   . Osteoarthritis, hand     Past Surgical History:  Procedure Laterality Date  . NO PAST SURGERIES      Family History  Problem Relation Age of Onset  . Diabetes Brother   . Breast cancer Neg Hx   . Ovarian cancer Neg Hx   . Colon cancer Neg Hx   . Heart disease Neg Hx     Social History   Social History  . Marital status: Single    Spouse name: N/A  . Number of children: N/A  . Years of education: N/A   Occupational History  . Not on file.   Social History Main Topics  . Smoking status: Former Games developermoker  . Smokeless tobacco: Never Used  . Alcohol use No  . Drug use: No  . Sexual activity: Not Currently    Birth control/ protection: Post-menopausal   Other Topics Concern  . Not on file   Social History Narrative   Widowed   Works full time   Carbonated beverages     Current Outpatient Prescriptions:  .  Cholecalciferol (VITAMIN D3) 2000 UNITS capsule, Take by mouth., Disp: , Rfl:  .  etodolac (LODINE) 200 MG capsule, Take 1 capsule (200 mg total) by mouth every 8 (eight) hours., Disp: 15 capsule, Rfl: 0 .  lidocaine (LIDODERM) 5 %, Place 1 patch onto the skin every 12 (twelve)  hours. Remove & Discard patch within 12 hours or as directed by MD, Disp: 10 patch, Rfl: 0 .  magic mouthwash w/lidocaine SOLN, Take 5 mLs by mouth 4 (four) times daily., Disp: 240 mL, Rfl: 0  Allergies  Allergen Reactions  . Tramadol Rash     Review of Systems  Constitutional: Negative for chills.  Gastrointestinal: Negative for nausea and vomiting.  Genitourinary: Positive for frequency. Negative for hematuria and urgency.      Objective  Vitals:   06/27/16 1426  BP: 120/77  Pulse: 73  Resp: 16  Temp: 97.7 F (36.5 C)  TempSrc: Oral  SpO2: 99%  Weight: 163 lb 1.6 oz (74 kg)  Height: 5\' 4"  (1.626 m)    Physical Exam  Constitutional: She is oriented to person, place, and time and well-developed, well-nourished, and in no distress.  Cardiovascular: Normal rate, regular rhythm and normal heart sounds.   No murmur heard. Pulmonary/Chest: Effort normal and breath sounds normal. She has no wheezes.  Abdominal: Soft. Bowel sounds are normal. There is no tenderness. There is no CVA tenderness.  Neurological: She is alert and oriented to person, place, and time.  Psychiatric: Mood, memory, affect and judgment normal.  Nursing note and vitals reviewed.  Recent Results (from the past 2160 hour(s))  POCT Urinalysis Dipstick     Status: Abnormal   Collection Time: 06/27/16  2:34 PM  Result Value Ref Range   Color, UA     Clarity, UA     Glucose, UA neg    Bilirubin, UA neg    Ketones, UA neg    Spec Grav, UA 1.025 1.010 - 1.025   Blood, UA +++    pH, UA 5.0 5.0 - 8.0   Protein, UA Trace    Urobilinogen, UA 0.2 0.2 or 1.0 E.U./dL   Nitrite, UA neg    Leukocytes, UA Negative Negative     Assessment & Plan  1. Acute cystitis with hematuria Based on history and exam, start patient on Macrobid, sent for urinalysis and culture - POCT Urinalysis Dipstick - Urinalysis, Routine w reflex microscopic - Urine Culture - nitrofurantoin, macrocrystal-monohydrate,  (MACROBID) 100 MG capsule; Take 1 capsule (100 mg total) by mouth 2 (two) times daily.  Dispense: 14 capsule; Refill: 0   Daneya Hartgrove Asad A. [facility] 06/27/2016 2:37 PM

## 2016-06-28 ENCOUNTER — Telehealth: Payer: Self-pay | Admitting: Family Medicine

## 2016-06-28 LAB — URINALYSIS, ROUTINE W REFLEX MICROSCOPIC
BILIRUBIN URINE: NEGATIVE
Glucose, UA: NEGATIVE
KETONES UR: NEGATIVE
NITRITE: POSITIVE — AB
Specific Gravity, Urine: 1.024 (ref 1.001–1.035)
pH: 5.5 (ref 5.0–8.0)

## 2016-06-28 LAB — URINALYSIS, MICROSCOPIC ONLY
CASTS: NONE SEEN [LPF]
Crystals: NONE SEEN [HPF]
RBC / HPF: >60 RBC/HPF — AB (ref <=2)
WBC, UA: >60 WBC/HPF — AB (ref <=5)
YEAST: NONE SEEN [HPF]

## 2016-06-28 NOTE — Telephone Encounter (Signed)
Please schedule patient for an appointment to evaluate her symptoms and treat accordingly.

## 2016-06-28 NOTE — Telephone Encounter (Signed)
Pt states she was here yesterday and was told to call back if she saw blood in her stool. Pt states she did have some this morning.

## 2016-06-29 LAB — URINE CULTURE

## 2016-06-29 NOTE — Telephone Encounter (Signed)
LMOM for for pt to call and schedule an appt

## 2016-07-11 ENCOUNTER — Telehealth: Payer: Self-pay | Admitting: Family Medicine

## 2016-07-11 NOTE — Telephone Encounter (Signed)
Pt feels as though the yeast infection is coming from the antibiotic. Please send to [facility] °

## 2016-07-11 NOTE — Telephone Encounter (Signed)
Please schedule patient for an appointment to evaluate her symptoms and prescribe appropriate treatment

## 2016-07-11 NOTE — Telephone Encounter (Signed)
Pt feels as though the yeast infection is coming from the antibiotic. Please send to [facility]

## 2016-07-11 NOTE — Telephone Encounter (Signed)
Pt was seen this month and was given something for infection. She had to [facility] to [facility] due to irritation. She did not have vaginal yeast infection however it is on the outside of the vagina. They prescribed Miconazole nitrate. Please return call to discuss. [phone]

## 2016-07-12 ENCOUNTER — Ambulatory Visit (INDEPENDENT_AMBULATORY_CARE_PROVIDER_SITE_OTHER): Payer: BLUE CROSS/BLUE SHIELD | Admitting: Family Medicine

## 2016-07-12 ENCOUNTER — Encounter: Payer: Self-pay | Admitting: Family Medicine

## 2016-07-12 VITALS — BP 118/70 | HR 75 | Temp 98.3°F | Resp 16 | Ht 64.0 in | Wt 162.9 lb

## 2016-07-12 DIAGNOSIS — R7303 Prediabetes: Secondary | ICD-10-CM | POA: Diagnosis not present

## 2016-07-12 DIAGNOSIS — B379 Candidiasis, unspecified: Secondary | ICD-10-CM | POA: Diagnosis not present

## 2016-07-12 DIAGNOSIS — T3695XA Adverse effect of unspecified systemic antibiotic, initial encounter: Secondary | ICD-10-CM | POA: Diagnosis not present

## 2016-07-12 HISTORY — DX: Prediabetes: R73.03

## 2016-07-12 LAB — POCT GLYCOSYLATED HEMOGLOBIN (HGB A1C): Hemoglobin A1C: 6.1

## 2016-07-12 NOTE — Progress Notes (Signed)
Name: Brenda Ellison   MRN: [id number]    DOB: 1960/03/03   Date:07/12/2016       Progress Note  Subjective  Chief Complaint  Chief Complaint  Patient presents with  . Vaginitis    HPI  Patient is here to have her A1c checked, last level was 6.3% in September 2017, she has no personal history of diabetes but has a brother who is an insulin-dependent diabetic. Patient admits to eating a lot of sugar in her diet, she is considered overweight.   [facility] for [facility] induced yeast infection with vaginal discharge, itching, and burning. She was prescribed antifungal which has been working well.   Past Medical History:  Diagnosis Date  . Arthritis pain   . Bilateral leg pain   . Chronic nasal congestion   . Dizziness of unknown cause   . Heart murmur previously undiagnosed   . Osteoarthritis, hand     Past Surgical History:  Procedure Laterality Date  . NO PAST SURGERIES      Family History  Problem Relation Age of Onset  . Diabetes Brother   . Breast cancer Neg Hx   . Ovarian cancer Neg Hx   . Colon cancer Neg Hx   . Heart disease Neg Hx     Social History   Social History  . Marital status: Single    Spouse name: N/A  . Number of children: N/A  . Years of education: N/A   Occupational History  . Not on file.   Social History Main Topics  . Smoking status: Former Games developer  . Smokeless tobacco: Never Used  . Alcohol use No  . Drug use: No  . Sexual activity: Not Currently    Birth control/ protection: Post-menopausal   Other Topics Concern  . Not on file   Social History Narrative   Widowed   Works full time   Carbonated beverages     Current Outpatient Prescriptions:  .  Cholecalciferol (VITAMIN D3) 2000 UNITS capsule, Take by mouth., Disp: , Rfl:  .  etodolac (LODINE) 200 MG capsule, Take 1 capsule (200 mg total) by mouth every 8 (eight) hours., Disp: 15 capsule, Rfl: 0 .  lidocaine (LIDODERM) 5 %, Place 1  patch onto the skin every 12 (twelve) hours. Remove & Discard patch within 12 hours or as directed by MD, Disp: 10 patch, Rfl: 0 .  magic mouthwash w/lidocaine SOLN, Take 5 mLs by mouth 4 (four) times daily., Disp: 240 mL, Rfl: 0  Allergies  Allergen Reactions  . Tramadol Rash     ROS  Please see history of present illness for complete discussion of ROS  Objective  Vitals:   07/12/16 1447  BP: 118/70  Pulse: 75  Resp: 16  Temp: 98.3 F (36.8 C)  TempSrc: Oral  SpO2: 98%  Weight: 162 lb 14.4 oz (73.9 kg)  Height: 5\' 4"  (1.626 m)    Physical Exam  Constitutional: She is oriented to person, place, and time and well-developed, well-nourished, and in no distress.  HENT:  Head: Normocephalic and atraumatic.  Cardiovascular: Normal rate, regular rhythm and normal heart sounds.   No murmur heard. Pulmonary/Chest: Effort normal and breath sounds normal. She has no wheezes.  Neurological: She is alert and oriented to person, place, and time.  Psychiatric: Mood, memory, affect and judgment normal.  Nursing note and vitals reviewed.     Assessment & Plan  1. Prediabetes   A1c 6.1%, consistent  with prediabetes, no change from September 2017, encouraged to adopt a balanced low carbohydrate diet - POCT glycosylated hemoglobin (Hb A1C)  2. Antibiotic-induced yeast infection Appears to be resolving. Reassured     Kaylana Fenstermacher Asad A. [facility] 07/12/2016 2:51 PM

## 2016-07-12 NOTE — Telephone Encounter (Signed)
Patient notified, appointment made

## 2016-08-10 ENCOUNTER — Ambulatory Visit (INDEPENDENT_AMBULATORY_CARE_PROVIDER_SITE_OTHER): Payer: BLUE CROSS/BLUE SHIELD | Admitting: Family Medicine

## 2016-08-10 ENCOUNTER — Encounter: Payer: Self-pay | Admitting: Family Medicine

## 2016-08-10 VITALS — BP 112/74 | HR 72 | Temp 97.7°F | Resp 16 | Ht 64.0 in | Wt 156.0 lb

## 2016-08-10 DIAGNOSIS — B3731 Acute candidiasis of vulva and vagina: Secondary | ICD-10-CM

## 2016-08-10 DIAGNOSIS — L298 Other pruritus: Secondary | ICD-10-CM | POA: Diagnosis not present

## 2016-08-10 DIAGNOSIS — B373 Candidiasis of vulva and vagina: Secondary | ICD-10-CM

## 2016-08-10 DIAGNOSIS — M79602 Pain in left arm: Secondary | ICD-10-CM

## 2016-08-10 DIAGNOSIS — N898 Other specified noninflammatory disorders of vagina: Secondary | ICD-10-CM

## 2016-08-10 DIAGNOSIS — M19041 Primary osteoarthritis, right hand: Secondary | ICD-10-CM | POA: Diagnosis not present

## 2016-08-10 MED ORDER — NYSTATIN 100000 UNIT/GM EX CREA: 1.0000 "application " | TOPICAL_CREAM | Freq: Two times a day (BID) | CUTANEOUS | 0 refills | Status: DC

## 2016-08-10 MED ORDER — FLUCONAZOLE 150 MG PO TABS: 150.0000 mg | ORAL_TABLET | Freq: Once | ORAL | 0 refills | Status: AC

## 2016-08-10 NOTE — Progress Notes (Addendum)
Name: Brenda Ellison   MRN: [id number]    DOB: 08-30-1960   Date:08/10/2016       Progress Note  Subjective  Chief Complaint  Chief Complaint  Patient presents with  . Follow-up    recurrent itching and burning  . Referral    to GYN    HPI  Pt presents with c/o vaginal irritation and buttocks irritation. She was treated with topical cream by the health department (she is unsure of what kind of cream it was), and symptoms improved temporarily and flared back up after she used all the cream.  Now using monistat and vagisil without relief of symptoms. Vaginal discharge is clear; endorses vulvovaginal and intergluteal cleft itching and irritation, and some pain.  No vaginal bleeding, abdominal pain, NVD, no dysuria or other urinary symptoms. Has had 3 total partners in the last year, had [facility]I [facility] at [facility] in April or May 2018, and she has not had a new partner since then. Declines STI testing today, but we will recheck for STI's if treatment today does not help symptoms.  Last A1C on 07/12/2016: 6.1%, Last LFT's on 11/09/2015 were WNL  Left Hand and Arm Pain: Ongoing describes as pins and needles and is worse at night, she has OA and sees Dr. Tresa Moore with Rheumatology.  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  Last visit was 04/26/2016, and she was given exercises to do and she has not been doing them. She does not take Tylenol or Advil for pain.   Patient Active Problem List   Diagnosis Date Noted  . Prediabetes 07/12/2016  . Chest pain 03/22/2016  . Upper back pain on left side 03/22/2016  . Well woman exam without gynecological exam 11/09/2015  . 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 Dysuria 11/05/2015  . Family history of diabetes mellitus in brother 11/05/2015  . Inflammatory arthritis 06/07/2015  . Hyperglycemia 05/07/2015  . Acute pain of right wrist 05/07/2015  . Multiple allergies 01/12/2015  . 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  Annual physical exam 10/19/2014  . 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 Degenerative arthritis of hip 10/19/2014  . Allergic dermatitis 10/05/2014  . Nerve root pain 10/05/2014  . Dyslipidemia 10/05/2014  . Arthritis of hand, degenerative 10/05/2014  . Benign essential HTN 05/01/2014    Social History  Substance Use Topics  . Smoking status: Former Games developer  . Smokeless tobacco: Never Used  . Alcohol use No     Current Outpatient Prescriptions:  .  Cholecalciferol (VITAMIN D3) 2000 UNITS capsule, Take by mouth., Disp: , Rfl:  .  etodolac (LODINE) 200 MG capsule, Take 1 capsule (200 mg total) by mouth every 8 (eight) hours. (Patient not taking: Reported on 08/10/2016), Disp: 15 capsule, Rfl: 0 .  lidocaine (LIDODERM) 5 %, Place 1 patch onto the skin every 12 (twelve) hours. Remove & Discard patch within 12 hours or as directed by MD (Patient not taking: Reported on 08/10/2016), Disp: 10 patch, Rfl: 0 .  magic mouthwash w/lidocaine SOLN, Take 5 mLs by mouth 4 (four) times daily. (Patient not taking: Reported on 08/10/2016), Disp: 240 mL, Rfl: 0  Allergies  Allergen Reactions  . Tramadol Rash    ROS  Ten systems reviewed and is negative except as mentioned in HPI  Objective  Vitals:   08/10/16 0809  BP: 112/74  Pulse: 72  Resp: 16  Temp: 97.7 F (36.5 C)  TempSrc: Oral  SpO2: 97%  Weight: 156 lb (70.8 kg)  Height: 5\' 4"  (1.626 m)    Body mass index is 26.78 kg/m.  Nursing Note and Vital Signs reviewed.  Physical Exam  Constitutional: Patient appears well-developed and well-nourished. Obese No distress.  HEENT: head atraumatic, normocephalic Cardiovascular: Normal rate, regular rhythm, S1/S2 present.  No murmur or rub heard. No BLE edema. Pulmonary/Chest: Effort normal and breath sounds clear. No respiratory distress or retractions. Abdominal: Soft and non-tender, bowel sounds present x4 quadrants. Psychiatric: Patient has a normal mood and affect.  behavior is normal. Judgment and thought content normal. Skin:  Mild to moderate irritation at intergluteal cleft and vulva as described below. FEMALE GENITALIA: External genitalia - vulva is moderately erythematous External urethra normal Vaginal vault normal with yellow-white thin discharge and no lesions Cervix normal without lesions RECTAL: no external hemorrhoids  Recent Results (from the past 2160 hour(s))  POCT Urinalysis Dipstick     Status: Abnormal   Collection Time: 06/27/16  2:34 PM  Result Value Ref Range   Color, UA     Clarity, UA     Glucose, UA neg    Bilirubin, UA neg    Ketones, UA neg    Spec Grav, UA 1.025 1.010 - 1.025   Blood, UA +++    pH, UA 5.0 5.0 - 8.0   Protein, UA Trace    Urobilinogen, UA 0.2 0.2 or 1.0 E.U./dL   Nitrite, UA neg    Leukocytes, UA Negative Negative  Urinalysis, Routine w reflex microscopic     Status: Abnormal   Collection Time: 06/27/16  2:41 PM  Result Value Ref Range   Color, Urine YELLOW YELLOW   APPearance CLEAR CLEAR   Specific Gravity, Urine 1.024 1.001 - 1.035   pH 5.5 5.0 - 8.0   Glucose, UA NEGATIVE NEGATIVE   Bilirubin Urine NEGATIVE NEGATIVE   Ketones, ur NEGATIVE NEGATIVE   Hgb urine dipstick 3+ (A) NEGATIVE   Protein, ur 1+ (A) NEGATIVE   Nitrite POSITIVE (A) NEGATIVE   Leukocytes, UA 2+ (A) NEGATIVE  Urine Microscopic     Status: Abnormal   Collection Time: 06/27/16  2:41 PM  Result Value Ref Range   WBC, UA >60 (A) <=5 WBC/HPF   RBC / HPF >60 (A) <=2 RBC/HPF   Squamous Epithelial / LPF 6-10 (A) <=5 HPF    Comment: Transitional Epithelial Cells 0-5                    <=5           HPF   Bacteria, UA MANY (A) NONE SEEN HPF   Crystals NONE SEEN NONE SEEN HPF   Casts NONE SEEN NONE SEEN LPF   Yeast NONE SEEN NONE SEEN HPF  Urine Culture     Status: None   Collection Time: 06/27/16  2:48 PM  Result Value Ref Range   Culture ESCHERICHIA COLI     Comment: SOURCE: URINE&URINE   Colony Count Greater than  100,000 CFU/mL    Organism ID, Bacteria ESCHERICHIA COLI       Susceptibility   Escherichia coli -  (no method available)    AMPICILLIN 4 Sensitive     AMOX/CLAVULANIC <=2 Sensitive     AMPICILLIN/SULBACTAM <=2 Sensitive     PIP/TAZO <=4 Sensitive     IMIPENEM <=0.25 Sensitive     CEFAZOLIN <=4 Not Reportable     CEFTRIAXONE <=1 Sensitive     CEFTAZIDIME <=1 Sensitive  CEFEPIME <=1 Sensitive     GENTAMICIN <=1 Sensitive     TOBRAMYCIN <=1 Sensitive     CIPROFLOXACIN <=0.25 Sensitive     LEVOFLOXACIN <=0.12 Sensitive     NITROFURANTOIN <=16 Sensitive     TRIMETH/SULFA* <=20 Sensitive      * NR=NOT REPORTABLE,SEE COMMENTORAL therapy:A cefazolin MIC of <32 predicts susceptibility to the oral agents cefaclor,cefdinir,cefpodoxime,cefprozil,cefuroxime,cephalexin,and loracarbef when used for therapy of uncomplicated UTIs due to E.coli,K.pneumomiae,and P.mirabilis. PARENTERAL therapy: A cefazolinMIC of >8 indicates resistance to parenteralcefazolin. An alternate test method must beperformed to confirm susceptibility to parenteralcefazolin.  POCT glycosylated hemoglobin (Hb A1C)     Status: Normal   Collection Time: 07/12/16  3:35 PM  Result Value Ref Range   Hemoglobin A1C 6.1      Assessment & Plan  1. Vaginal itching - WET PREP BY MOLECULAR PROBE - nystatin cream (MYCOSTATIN); Apply 1 application topically 2 (two) times daily.  Dispense: 30 g; Refill: 0 - fluconazole (DIFLUCAN) 150 MG tablet; Take 1 tablet (150 mg total) by mouth once. May repeat in 2 days if not improving.  Dispense: 3 tablet; Refill: 0  2. Vulvovaginal candidiasis - nystatin cream (MYCOSTATIN); Apply 1 application topically 2 (two) times daily.  Dispense: 30 g; Refill: 0 - fluconazole (DIFLUCAN) 150 MG tablet; Take 1 tablet (150 mg total) by mouth once. May repeat in 2 days if not improving.  Dispense: 3 tablet; Refill: 0  - Advised to not apply cream internally. PT is instructed to Mix Nystatin cream with OTC  hydrocortisone cream for BID application. - Advised patient to take Diflucan every other day if not improving x3 doses.  3. Primary osteoarthritis of right hand  4. Left arm pain - Encouraged patient to take Tylenol PRN for pain in accordance with OTC instructions and to ice areas that hurt. Also explained that once her flare has improved, she needs to do her exercises regularly and that this can help prevent future flares of pain. - Advised to schedule an appointment with her Rheumatologist for evaluation.  -Red flags and when to present for emergency care or RTC including fever >101.22F, chest pain, shortness of breath, new/worsening/un-resolving symptoms, fevers/chills, increase in pain reviewed with patient at time of visit. Follow up and care instructions discussed and provided in AVS.  I have reviewed this encounter including the documentation in this note and/or discussed this patient with the Deboraha Sprangprovider,Denisia Harpole, FNP, NP-C. I am certifying that I agree with the content of this note as supervising physician.  Alba CoryKrichna Sowles, MD [facility] 08/11/2016, 5:40 PM

## 2016-08-10 NOTE — Patient Instructions (Signed)
Purchase Hydricortisone Cream over the counter at your pharmacy. Mix this with the Nystatin Cream and apply to irritated area twice a day as tolerated.

## 2016-08-11 ENCOUNTER — Other Ambulatory Visit: Payer: Self-pay

## 2016-08-11 LAB — WET PREP BY MOLECULAR PROBE
Candida species: NOT DETECTED
Gardnerella vaginalis: NOT DETECTED
Trichomonas vaginosis: NOT DETECTED

## 2016-08-11 NOTE — Progress Notes (Signed)
Please call patient and let her know that her Wet Prep was negative for yeast, trichomonas, and bacterial vaginosis. Because she is having abnormal discharge and this is negative, I would like for her to come in to give a urine sample (nurse visit) to check for gonorrhea and chlamydia. Also, if she is willing, I'd like to do blood work to check for HIV and syphilis.  Let me know if she is willing to come in and I will place the orders for these.  Tell her to continue the medications/creams because I do still believe she has a yeast infection on her skin.  Thank you!

## 2016-08-14 ENCOUNTER — Other Ambulatory Visit: Payer: Self-pay

## 2016-08-14 ENCOUNTER — Other Ambulatory Visit: Payer: Self-pay | Admitting: Family Medicine

## 2016-08-14 ENCOUNTER — Telehealth: Payer: Self-pay

## 2016-08-14 DIAGNOSIS — N898 Other specified noninflammatory disorders of vagina: Secondary | ICD-10-CM

## 2016-08-14 DIAGNOSIS — Z113 Encounter for screening for infections with a predominantly sexual mode of transmission: Secondary | ICD-10-CM

## 2016-08-14 NOTE — Progress Notes (Signed)
Labs ordered for patient to have STI panel performed.

## 2016-08-14 NOTE — Telephone Encounter (Signed)
Pt stated someone kept calling and she never spoke with anyone about her wet prep results. Went over her results and also mention to her that Irving Burtonmily would like for her to come in as a nurse visit and get a urine sample and blood work since she is still having abnormal discharge and the results where neg for yeast and bacterial infection. Pt agreed and schedule a nurse visit.

## 2016-08-15 ENCOUNTER — Telehealth: Payer: Self-pay | Admitting: Family Medicine

## 2016-08-15 ENCOUNTER — Other Ambulatory Visit: Payer: BLUE CROSS/BLUE SHIELD

## 2016-08-15 NOTE — Telephone Encounter (Signed)
Pt requesting return call and states that if she does not answer then its okay to leave a detailed message. She would like to know if its okay for her to use the monostate kit (the one with the wipes)

## 2016-08-15 NOTE — Telephone Encounter (Signed)
Dr. Manuella Ghazi, Pt requesting return call and states that if she does not answer then its okay to leave a detailed message. She would like to know if its okay for her to use the monostate kit (the one with the wipes)

## 2016-08-15 NOTE — Telephone Encounter (Signed)
Return call and left a voice message, please schedule an appointment to discuss her symptoms and address any questions.

## 2016-08-16 LAB — RPR

## 2016-08-16 LAB — HIV ANTIBODY (ROUTINE TESTING W REFLEX): HIV: NONREACTIVE

## 2016-08-17 ENCOUNTER — Encounter: Payer: Self-pay | Admitting: Family Medicine

## 2016-08-17 ENCOUNTER — Ambulatory Visit (INDEPENDENT_AMBULATORY_CARE_PROVIDER_SITE_OTHER): Payer: BLUE CROSS/BLUE SHIELD | Admitting: Family Medicine

## 2016-08-17 VITALS — BP 112/78 | HR 69 | Temp 97.6°F | Resp 16 | Ht 64.0 in | Wt 155.7 lb

## 2016-08-17 DIAGNOSIS — Z113 Encounter for screening for infections with a predominantly sexual mode of transmission: Secondary | ICD-10-CM

## 2016-08-17 NOTE — Progress Notes (Signed)
Name: Brenda MaresMary A Ellison   MRN: [id number]    DOB: 12/14/1960   Date:08/17/2016       Progress Note  Subjective  Chief Complaint  Chief Complaint  Patient presents with  . Results    Discuss lab results     HPI  Patient is here to obtain urine specimen to check for Gonorrhea and Chlamydia testing, she had vaginal discharge last week which was treated with Diflucan and that has resolved. Wet prep was negative for BV and Trichomonas. She had further STD testing performed which showed negative HIV and RPR.  She denies any vaginal discharge or lesions at this time.    Past Medical History:  Diagnosis Date  . Arthritis pain   . Bilateral leg pain   . Chronic nasal congestion   . Dizziness of unknown cause   . Heart murmur previously undiagnosed   . Osteoarthritis, hand     Past Surgical History:  Procedure Laterality Date  . NO PAST SURGERIES      Family History  Problem Relation Age of Onset  . Diabetes Brother   . Breast cancer Neg Hx   . Ovarian cancer Neg Hx   . Colon cancer Neg Hx   . Heart disease Neg Hx     Social History   Social History  . Marital status: Single    Spouse name: N/A  . Number of children: N/A  . Years of education: N/A   Occupational History  . Not on file.   Social History Main Topics  . Smoking status: Former Games developermoker  . Smokeless tobacco: Never Used  . Alcohol use No  . Drug use: No  . Sexual activity: Not Currently    Birth control/ protection: Post-menopausal   Other Topics Concern  . Not on file   Social History Narrative   Widowed   Works full time   Carbonated beverages     Current Outpatient Prescriptions:  .  Cholecalciferol (VITAMIN D3) 2000 UNITS capsule, Take by mouth., Disp: , Rfl:  .  etodolac (LODINE) 200 MG capsule, Take 1 capsule (200 mg total) by mouth every 8 (eight) hours., Disp: 15 capsule, Rfl: 0 .  lidocaine (LIDODERM) 5 %, Place 1 patch onto the skin every 12 (twelve) hours. Remove & Discard patch within  12 hours or as directed by MD, Disp: 10 patch, Rfl: 0 .  nystatin cream (MYCOSTATIN), Apply 1 application topically 2 (two) times daily., Disp: 30 g, Rfl: 0  Allergies  Allergen Reactions  . Tramadol Rash     ROS  Please see history of present illness for complete discussion of ROS  Objective  Vitals:   08/17/16 0917  BP: 112/78  Pulse: 69  Resp: 16  Temp: 97.6 F (36.4 C)  TempSrc: Oral  SpO2: 97%  Weight: 155 lb 11.2 oz (70.6 kg)  Height: 5\' 4"  (1.626 m)    Physical Exam  Constitutional: She is oriented to person, place, and time and well-developed, well-nourished, and in no distress.  HENT:  Head: Normocephalic and atraumatic.  Cardiovascular: Normal rate, regular rhythm and normal heart sounds.   No murmur heard. Pulmonary/Chest: Effort normal and breath sounds normal. She has no wheezes.  Neurological: She is alert and oriented to person, place, and time.  Psychiatric: Mood, memory, affect and judgment normal.  Nursing note and vitals reviewed.      Assessment & Plan  1. Routine screening for STI (sexually transmitted infection) GC chlamydia screening is already been ordered, patient  advised to complete the testing portion.   [name] A. [facility] 08/17/2016 9:44 AM

## 2016-08-18 LAB — GC/CHLAMYDIA PROBE AMP
CT PROBE, AMP APTIMA: NOT DETECTED
GC Probe RNA: NOT DETECTED

## 2016-08-18 NOTE — Progress Notes (Signed)
Please call patient - negative gonorrhea and chlamydia. Thanks!

## 2016-09-14 ENCOUNTER — Ambulatory Visit (INDEPENDENT_AMBULATORY_CARE_PROVIDER_SITE_OTHER): Payer: BLUE CROSS/BLUE SHIELD | Admitting: Podiatry

## 2016-09-14 ENCOUNTER — Encounter: Payer: Self-pay | Admitting: Podiatry

## 2016-09-14 DIAGNOSIS — B351 Tinea unguium: Secondary | ICD-10-CM

## 2016-09-14 DIAGNOSIS — M79609 Pain in unspecified limb: Secondary | ICD-10-CM | POA: Diagnosis not present

## 2016-09-14 DIAGNOSIS — M898X9 Other specified disorders of bone, unspecified site: Secondary | ICD-10-CM

## 2016-09-14 NOTE — Progress Notes (Signed)
Complaint:  Visit Type: Patient returns to my office for continued preventative foot care services. Complaint: Patient states" my nails have grown long and thick and become painful to walk and wear shoes" . The patient presents for preventative foot care services. No changes to ROS  Podiatric Exam: Vascular: dorsalis pedis and posterior tibial pulses are palpable bilateral. Capillary return is immediate. Temperature gradient is WNL. Skin turgor WNL  Sensorium: Normal Semmes Weinstein monofilament test. Normal tactile sensation bilaterally. Nail Exam: Pt has thick disfigured discolored nails with subungual debris noted bilateral entire nail hallux through fifth toenails Ulcer Exam: There is no evidence of ulcer or pre-ulcerative changes or infection. Orthopedic Exam: Muscle tone and strength are WNL. No limitations in general ROM. No crepitus or effusions noted. Foot type and digits show no abnormalities. Bony prominences are unremarkable. 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 Skin: No Porokeratosis. No infection or ulcers  Diagnosis:  Onychomycosis, , Pain in right toe, pain in left toes  Treatment & Plan Procedures and Treatment: Consent by patient was obtained for treatment procedures. The patient understood the discussion of treatment and procedures well. All questions were answered thoroughly reviewed. Debridement of mycotic and hypertrophic toenails, 1 through 5 bilateral and clearing of subungual debris. No ulceration, no infection noted.  Return Visit-Office Procedure: Patient instructed to return to the office for a follow up visit 3 months for continued evaluation and treatment.    Helane GuntherGregory Tynetta Bachmann DPM

## 2016-09-26 ENCOUNTER — Encounter: Payer: Self-pay | Admitting: Emergency Medicine

## 2016-09-26 ENCOUNTER — Emergency Department
Admission: EM | Admit: 2016-09-26 | Discharge: 2016-09-26 | Disposition: A | Discharge status: Home / Self Care | Payer: BLUE CROSS/BLUE SHIELD | Attending: Emergency Medicine | Admitting: Emergency Medicine

## 2016-09-26 ENCOUNTER — Emergency Department: Payer: BLUE CROSS/BLUE SHIELD

## 2016-09-26 DIAGNOSIS — Z87891 Personal history of nicotine dependence: Secondary | ICD-10-CM | POA: Insufficient documentation

## 2016-09-26 DIAGNOSIS — I10 Essential (primary) hypertension: Secondary | ICD-10-CM | POA: Diagnosis not present

## 2016-09-26 DIAGNOSIS — M545 Low back pain, unspecified: Secondary | ICD-10-CM

## 2016-09-26 DIAGNOSIS — M4310 Spondylolisthesis, site unspecified: Secondary | ICD-10-CM | POA: Diagnosis not present

## 2016-09-26 DIAGNOSIS — M47817 Spondylosis without myelopathy or radiculopathy, lumbosacral region: Secondary | ICD-10-CM | POA: Diagnosis not present

## 2016-09-26 DIAGNOSIS — Z79899 Other long term (current) drug therapy: Secondary | ICD-10-CM | POA: Insufficient documentation

## 2016-09-26 MED ORDER — IBUPROFEN 600 MG PO TABS: 600.0000 mg | ORAL_TABLET | Freq: Three times a day (TID) | ORAL | 0 refills | Status: DC | PRN

## 2016-09-26 MED ORDER — LIDOCAINE 5 % EX PTCH
1.0000 | MEDICATED_PATCH | CUTANEOUS | Status: DC
  Administered 2016-09-26: 1 via TRANSDERMAL
  Filled 2016-09-26: qty 1

## 2016-09-26 MED ORDER — OXYCODONE-ACETAMINOPHEN 5-325 MG PO TABS: 1.0000 | ORAL_TABLET | Freq: Four times a day (QID) | ORAL | 0 refills | Status: DC | PRN

## 2016-09-26 MED ORDER — KETOROLAC TROMETHAMINE 30 MG/ML IJ SOLN
30.0000 mg | Freq: Once | INTRAMUSCULAR | Status: AC
Start: 2016-09-26 — End: 2016-09-26
  Administered 2016-09-26: 30 mg via INTRAMUSCULAR
  Filled 2016-09-26: qty 1

## 2016-09-26 MED ORDER — OXYCODONE-ACETAMINOPHEN 5-325 MG PO TABS
1.0000 | ORAL_TABLET | Freq: Once | ORAL | Status: AC
  Administered 2016-09-26: 1 via ORAL
  Filled 2016-09-26: qty 1

## 2016-09-26 NOTE — Discharge Instructions (Signed)
Please keep your appointment to follow-up with your primary care physician in 2 weeks for recheck. Take your strong pain medication only as needed when her pain is most severe in take her ibuprofen 3 times a day around the clock. Return to the emergency department for any concerns. Such as numbness, weakness, if you have difficulty urinating, or for any other issues whatsoever.  It was a pleasure to take care of you today, and thank you for coming to our emergency department.  If you have any questions or concerns before leaving please ask the nurse to grab me and I'm more than happy to go through your aftercare instructions again.  If you were prescribed any opioid pain medication today such as Norco, Vicodin, Percocet, morphine, hydrocodone, or oxycodone please make sure you do not drive when you are taking this medication as it can alter your ability to drive safely.  If you have any concerns once you are home that you are not improving or are in fact getting worse before you can make it to your follow-up appointment, please do not hesitate to call 911 and come back for further evaluation.  Merrily BrittleNeil Taren Dymek, MD  Results for orders placed or performed in visit on 08/17/16  GC/Chlamydia Probe Amp  Result Value Ref Range   CT Probe RNA NOT DETECTED    GC Probe RNA NOT DETECTED    Dg Lumbar Spine Complete  Result Date: 09/26/2016 CLINICAL DATA:  Right-sided lower back pain for 2 days. EXAM: LUMBAR SPINE - COMPLETE 4+ VIEW COMPARISON:  Lumbar spine x-rays dated March 06, 2014. FINDINGS: Hypoplastic ribs at T12. Five non rib-bearing lumbar type vertebral bodies. There is no evidence of lumbar spine fracture. 4 mm anterolisthesis of L3 on L4, slightly increased when compared to prior study. Trace retrolisthesis of L4 on L5, unchanged. Vertebral body heights are preserved. Mild multilevel disc height loss and anterior endplate spurring, most prominent from L3-L4 through L5-S1, is slightly worsened when  compared to prior study. Lower lumbar facet arthropathy. IMPRESSION: 1.  No acute osseous abnormality. 2. Mild multilevel degenerative disc disease throughout the lumbar spine, worst from L3-L4 through L5-S1, slightly increased compared to prior study. 3. Slightly increased 4 mm anterolisthesis at L3-L4. Electronically Signed   By: Obie DredgeWilliam T Derry M.D.   On: 09/26/2016 07:22

## 2016-09-26 NOTE — ED Notes (Signed)
Pt informed that urine specimen is needed for UA. Pt states she is not able now but will try again.

## 2016-09-26 NOTE — ED Provider Notes (Signed)
[facility] Emergency Department Provider Note  ____________________________________________   First MD Initiated Contact with Patient 09/26/16 [phone]     (approximate)  I have reviewed the triage vital signs and the nursing notes.   HISTORY  Chief Complaint Back Pain   HPI Brenda Ellison is a 56 y.o. female who self presents to the emergency Department with roughly 24 hours of insidious onset severe aching cramping right low back pain. She denies numbness or weakness. She denies trauma. She denies intravenous drug use.She denies abnormal perianal sensation. She denies difficulty initiating urination or defecation. The pain is constant aching and throbbing. She has taken aspirin at home with no relief. She has never had pain like this before. The pain does not radiate down her leg. She denies dysuria frequency or hesitancy.   Past Medical History:  Diagnosis Date  . Arthritis pain   . Bilateral leg pain   . Chronic nasal congestion   . Dizziness of unknown cause   . Heart murmur previously undiagnosed   . Osteoarthritis, hand     Patient Active Problem List   Diagnosis Date Noted  . Prediabetes 07/12/2016  . Chest pain 03/22/2016  . Upper back pain on left side 03/22/2016  . Well woman exam without gynecological exam 11/09/2015  . 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 Dysuria 11/05/2015  . Family history of diabetes mellitus in brother 11/05/2015  . Inflammatory arthritis 06/07/2015  . Hyperglycemia 05/07/2015  . Acute pain of right wrist 05/07/2015  . Multiple allergies 01/12/2015  . 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 Annual physical exam 10/19/2014  . 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 Degenerative arthritis of hip 10/19/2014  . Allergic dermatitis 10/05/2014  . Nerve root pain 10/05/2014  . Dyslipidemia 10/05/2014  . Arthritis of hand, degenerative 10/05/2014  . Benign essential HTN 05/01/2014    Past Surgical History:    Procedure Laterality Date  . NO PAST SURGERIES      Prior to Admission medications   Medication Sig Start Date End Date Taking? Authorizing Provider  Cholecalciferol (VITAMIN D3) 2000 UNITS capsule Take by mouth.    [provider]  etodolac (LODINE) 200 MG capsule Take 1 capsule (200 mg total) by mouth every 8 (eight) hours. 03/22/16   Ellyn HackShah, Syed Asad A, MD  ibuprofen (ADVIL,MOTRIN) 600 MG tablet Take 1 tablet (600 mg total) by mouth every 8 (eight) hours as needed. 09/26/16   Merrily Brittleifenbark, Torunn Chancellor, MD  lidocaine (LIDODERM) 5 % Place 1 patch onto the skin every 12 (twelve) hours. Remove & Discard patch within 12 hours or as directed by MD 03/20/16 03/20/17  Rebecka ApleyWebster, Allison P, MD  nystatin cream (MYCOSTATIN) Apply 1 application topically 2 (two) times daily. 08/10/16   Doren CustardBoyce, Emily E, FNP  oxyCODONE-acetaminophen (ROXICET) 5-325 MG tablet Take 1 tablet by mouth every 6 (six) hours as needed. 09/26/16 09/26/17  Merrily Brittleifenbark, Aedon Deason, MD    Allergies Tramadol  Family History  Problem Relation Age of Onset  . Diabetes Brother   . Breast cancer Neg Hx   . Ovarian cancer Neg Hx   . Colon cancer Neg Hx   . Heart disease Neg Hx     Social History Social History  Substance Use Topics  . Smoking status: Former Games developermoker  . Smokeless tobacco: Never Used  . Alcohol use No    Review of Systems Constitutional: No fever/chills ENT: No sore throat. Cardiovascular: Denies chest pain. Respiratory: Denies shortness of breath. Gastrointestinal: No abdominal pain.  No nausea, no vomiting.  No diarrhea.  No constipation. Musculoskeletal: Positive for back pain. Neurological: Negative for headaches   ____________________________________________   PHYSICAL EXAM:  VITAL SIGNS: ED Triage Vitals  Enc Vitals Group     BP 09/26/16 0447 112/70     Pulse Rate 09/26/16 0447 (!) 57     Resp 09/26/16 0447 15     Temp 09/26/16 0447 98.1 F (36.7 C)     Temp Source 09/26/16 0447 Oral     SpO2 09/26/16 0447  98 %     Weight 09/26/16 0444 155 lb (70.3 kg)     Height --      Head Circumference --      Peak Flow --      Pain Score --      Pain Loc --      Pain Edu? --      Excl. in GC? --     Constitutional: Alert and oriented 4 appears uncomfortable curled on her side in bed tearful Head: Atraumatic. Nose: No congestion/rhinnorhea. Mouth/Throat: No trismus Neck: No stridor.   Cardiovascular: Regular rate and rhythm Respiratory: Normal respiratory effort.  No retractions. Gastrointestinal: Soft nondistended nontender no rebound or guarding no peritonitis Musculoskeletal: No midline back pain quite tender right low back with spasm 5 out of 5 hip flexion and hip extension plantar flexion dorsiflexion sensation intact to light touch throughout negative straight leg raise Neurologic:  Normal speech and language. No gross focal neurologic deficits are appreciated.  Skin:  Skin is warm, dry and intact. No rash noted.    ____________________________________________  LABS (all labs ordered are listed, but only abnormal results are displayed)  Labs Reviewed  URINALYSIS, COMPLETE (UACMP) WITH MICROSCOPIC     __________________________________________  EKG   ____________________________________________  RADIOLOGYX-ray with anterior listhesis and significant osteoarthritis ____________________________________________   PROCEDURES  Procedure(s) performed: no  Procedures  Critical Care performed: no  Observation: no ____________________________________________   INITIAL IMPRESSION / ASSESSMENT AND PLAN / ED COURSE  Pertinent labs & imaging results that were available during my care of the patient were reviewed by me and considered in my medical decision making (see chart for details).  The patient arrives neurovascularly intact with a significant amount of right low back pain. Given her normal neuro exam I doubt cauda equina, epidural abscess, transverse myelitis etc. She is  56 years old and has never had her low back imaged so believe she warrants plain films at this time along with Percocet and Toradol and Lidoderm patch.     The patient's pain is markedly improved after treatment. Her x-ray shows osteoarthritis but no obvious fractures. I've instructed her to follow up with her primary care physician in 2 weeks as scheduled and I will give her a short course of opioid pain medications in the meantime. Strict return precautions given. ____________________________________________   FINAL CLINICAL IMPRESSION(S) / ED DIAGNOSES  Final diagnoses:  Acute right-sided low back pain without sciatica  Acute bilateral low back pain without sciatica  Spondylosis of lumbosacral region without myelopathy or radiculopathy  Anterolisthesis      NEW MEDICATIONS STARTED DURING THIS VISIT:  Discharge Medication List as of 09/26/2016  7:29 AM    START taking these medications   Details  ibuprofen (ADVIL,MOTRIN) 600 MG tablet Take 1 tablet (600 mg total) by mouth every 8 (eight) hours as needed., Starting Tue 09/26/2016, Print    oxyCODONE-acetaminophen (ROXICET) 5-325 MG tablet Take 1 tablet by mouth every 6 (six) hours  as needed., Starting Tue 09/26/2016, Until Wed 09/26/2017, Print         Note:  This document was prepared using Dragon voice recognition software and may include unintentional dictation errors.      Merrily Brittle, MD 09/26/16 [phone]

## 2016-09-26 NOTE — ED Triage Notes (Signed)
Pt to triage in wheelchair. Pt c/o right side, lower back pain x2 days. Pt denies urinary symptoms at this time. Pt denies taking OTC meds for relief.

## 2016-09-26 NOTE — ED Notes (Signed)
Discussed discharge instructions, prescriptions, and follow-up care with patient. No questions or concerns at this time. Pt stable at discharge.  

## 2016-10-06 ENCOUNTER — Telehealth: Payer: Self-pay | Admitting: Family Medicine

## 2016-10-06 NOTE — Telephone Encounter (Signed)
Patient has been notified to call and schedule hospital follow up appointment

## 2016-10-06 NOTE — Telephone Encounter (Signed)
PT HAS CALLED BACK WANTING TO KNOW ABOUT HER MESSAGE SHE LEFT EARLY THIS MORNING. I SEEN WHERE IT SAID PT NOTIFED BUT THE PT SAID SHE HAS NOT HEARD FROM ANYBODY. MAKING THE PT A FOLLOW UP PER MESSAGE.

## 2016-10-06 NOTE — Telephone Encounter (Signed)
Patient must be seen for ER follow-up visit before any refills on any medications as prescribed by the ER may be given. Please schedule for an appointment in 2 weeks

## 2016-10-06 NOTE — Telephone Encounter (Signed)
Patient stated that she went to the ER on 09/26/16 for acute right sided pain.  Patient wanted know if she could get a refill on the pain meds. Prescribed to at the ER.  Patient has an appointment w/Dr. Sherryll Burger on 10/12/16.  I informed patient that she would need to be seen before Dr. Sherryll Burger would even consider refill the pain meds, but patient wanted me to send this message anyway just make sure this is what the provider would tell her.

## 2016-10-11 ENCOUNTER — Ambulatory Visit: Payer: BLUE CROSS/BLUE SHIELD | Admitting: Family Medicine

## 2016-10-12 ENCOUNTER — Encounter: Payer: Self-pay | Admitting: Family Medicine

## 2016-10-12 ENCOUNTER — Ambulatory Visit: Payer: BLUE CROSS/BLUE SHIELD | Admitting: Family Medicine

## 2016-10-12 ENCOUNTER — Ambulatory Visit (INDEPENDENT_AMBULATORY_CARE_PROVIDER_SITE_OTHER): Payer: BLUE CROSS/BLUE SHIELD | Admitting: Family Medicine

## 2016-10-12 VITALS — BP 118/86 | HR 68 | Temp 97.5°F | Resp 16 | Wt 153.4 lb

## 2016-10-12 DIAGNOSIS — M4726 Other spondylosis with radiculopathy, lumbar region: Secondary | ICD-10-CM | POA: Diagnosis not present

## 2016-10-12 MED ORDER — OXYCODONE-ACETAMINOPHEN 5-325 MG PO TABS: 1.0000 | ORAL_TABLET | Freq: Three times a day (TID) | ORAL | 0 refills | Status: AC | PRN

## 2016-10-12 NOTE — Progress Notes (Signed)
Name: Brenda Ellison   MRN: [id number]    DOB: March 09, 1960   Date:10/12/2016       Progress Note  Subjective  Chief Complaint  Chief Complaint  Patient presents with  . ER Follow-up    went to [facility] and [facility]  . Arthritis    chronic pain    HPI  Pt. Presents for hospital (ER) follow up, she was seen at [facility] ER on August 7th, 2018 for complaints of right lower back pain radiating down her right leg, described as numbness 'rolling down my leg'. She had a lumbar spine X ray which showed advanced degenerative disc disease, then went on her own to [facility] PM&R who has ordered physical therapy and occupational therapy along with an MRI of lumbar spine for evaluation of radicular symptoms.    Past Medical History:  Diagnosis Date  . Arthritis pain   . Bilateral leg pain   . Chronic nasal congestion   . Dizziness of unknown cause   . Heart murmur previously undiagnosed   . Osteoarthritis, hand     Past Surgical History:  Procedure Laterality Date  . NO PAST SURGERIES      Family History  Problem Relation Age of Onset  . Diabetes Brother   . Breast cancer Neg Hx   . Ovarian cancer Neg Hx   . Colon cancer Neg Hx   . Heart disease Neg Hx     Social History   Social History  . Marital status: Single    Spouse name: N/A  . Number of children: N/A  . Years of education: N/A   Occupational History  . Not on file.   Social History Main Topics  . Smoking status: Former Games developer  . Smokeless tobacco: Never Used  . Alcohol use No  . Drug use: No  . Sexual activity: Not Currently    Birth control/ protection: Post-menopausal   Other Topics Concern  . Not on file   Social History Narrative   Widowed   Works full time   Carbonated beverages     Current Outpatient Prescriptions:  .  Cholecalciferol (VITAMIN D3) 2000 UNITS capsule, Take by mouth., Disp: , Rfl:  .  diclofenac sodium (VOLTAREN) 1 % GEL, Apply 1 application topically 4 (four) times daily as needed., Disp: , Rfl:    .  ibuprofen (ADVIL,MOTRIN) 600 MG tablet, Take 1 tablet (600 mg total) by mouth every 8 (eight) hours as needed., Disp: 30 tablet, Rfl: 0 .  etodolac (LODINE) 200 MG capsule, Take 1 capsule (200 mg total) by mouth every 8 (eight) hours. (Patient not taking: Reported on 10/12/2016), Disp: 15 capsule, Rfl: 0 .  lidocaine (LIDODERM) 5 %, Place 1 patch onto the skin every 12 (twelve) hours. Remove & Discard patch within 12 hours or as directed by MD (Patient not taking: Reported on 10/12/2016), Disp: 10 patch, Rfl: 0 .  nystatin cream (MYCOSTATIN), Apply 1 application topically 2 (two) times daily. (Patient not taking: Reported on 10/12/2016), Disp: 30 g, Rfl: 0 .  oxyCODONE-acetaminophen (ROXICET) 5-325 MG tablet, Take 1 tablet by mouth every 6 (six) hours as needed. (Patient not taking: Reported on 10/12/2016), Disp: 15 tablet, Rfl: 0  Allergies  Allergen Reactions  . Tramadol Rash     ROS  Please see history of present illness for complete description of ROS  Objective  Vitals:   10/12/16 0949  BP: 118/86  Pulse: 68  Resp: 16  Temp: (!) 97.5 F (36.4 C)  TempSrc:  Oral  SpO2: 98%  Weight: 153 lb 6.4 oz (69.6 kg)    Physical Exam  Constitutional: She is well-developed, well-nourished, and in no distress.  Cardiovascular: Normal rate, regular rhythm and normal heart sounds.   No murmur heard. Pulmonary/Chest: Effort normal and breath sounds normal. She has no wheezes.  Musculoskeletal:       Lumbar back: She exhibits tenderness, pain and spasm.       Back:  Right SLR positive (radiaiton of pain down the right thigh)  Nursing note and vitals reviewed.    Assessment & Plan  1. Osteoarthritis of spine with radiculopathy, lumbar region Patient has an MRI ordered by the PMR specialist at [facility], she reports pain relief with Percocet prescribed in the ER. I will give a 5 day supply to the patient and will refer to pain clinic for further - oxyCODONE-acetaminophen (ROXICET) 5-325 MG  tablet; Take 1 tablet by mouth every 8 (eight) hours as needed.  Dispense: 15 tablet; Refill: 0 - Ambulatory referral to Pain Clinic   [facility] 10/12/2016 10:05 AM

## 2016-11-13 ENCOUNTER — Ambulatory Visit: Payer: BLUE CROSS/BLUE SHIELD | Admitting: Family Medicine

## 2016-11-15 ENCOUNTER — Ambulatory Visit (INDEPENDENT_AMBULATORY_CARE_PROVIDER_SITE_OTHER): Payer: BLUE CROSS/BLUE SHIELD | Admitting: Family Medicine

## 2016-11-15 ENCOUNTER — Encounter: Payer: Self-pay | Admitting: Family Medicine

## 2016-11-15 VITALS — BP 102/60 | HR 77 | Temp 98.0°F | Resp 16 | Ht 64.0 in | Wt 154.7 lb

## 2016-11-15 DIAGNOSIS — M47816 Spondylosis without myelopathy or radiculopathy, lumbar region: Secondary | ICD-10-CM

## 2016-11-15 NOTE — Progress Notes (Signed)
Name: Brenda Ellison   MRN: [id number]    DOB: 1960-03-20   Date:11/15/2016       Progress Note  Subjective  Chief Complaint  Chief Complaint  Patient presents with  . Follow-up    Arthritis pain     HPI  Patient presents for follow up of lower back radicular pain, ongoing for over a year, worse with activity specifically being on her feet for extended hours (works in a kitchen washing dishes). She has seen Brenda Ellison PM&R physician who recommended and ordered an MRI of lumbar spine which was verbally reviewed with the patient today. It shows lumbar spondylosis, right neural foraminal narrowing at L3-L4 and L2-L3. She is taking Percocet 5-325 mg 1 pill as needed and Ibuprofen 800 mg as needed.   Past Medical History:  Diagnosis Date  . Arthritis pain   . Bilateral leg pain   . Chronic nasal congestion   . Dizziness of unknown cause   . Heart murmur previously undiagnosed   . Osteoarthritis, hand     Past Surgical History:  Procedure Laterality Date  . NO PAST SURGERIES      Family History  Problem Relation Age of Onset  . Diabetes Brother   . Breast cancer Neg Hx   . Ovarian cancer Neg Hx   . Colon cancer Neg Hx   . Heart disease Neg Hx     Social History   Social History  . Marital status: Single    Spouse name: N/A  . Number of children: N/A  . Years of education: N/A   Occupational History  . Not on file.   Social History Main Topics  . Smoking status: Former Games developer  . Smokeless tobacco: Never Used  . Alcohol use No  . Drug use: No  . Sexual activity: Not Currently    Birth control/ protection: Post-menopausal   Other Topics Concern  . Not on file   Social History Narrative   Widowed   Works full time   Carbonated beverages     Current Outpatient Prescriptions:  .  Cholecalciferol (VITAMIN D3) 2000 UNITS capsule, Take by mouth., Disp: , Rfl:  .  ibuprofen (ADVIL,MOTRIN) 600 MG tablet, Take 1 tablet (600 mg total) by mouth every 8 (eight) hours as  needed., Disp: 30 tablet, Rfl: 0 .  nystatin cream (MYCOSTATIN), Apply 1 application topically 2 (two) times daily., Disp: 30 g, Rfl: 0 .  etodolac (LODINE) 200 MG capsule, Take 1 capsule (200 mg total) by mouth every 8 (eight) hours. (Patient not taking: Reported on 10/12/2016), Disp: 15 capsule, Rfl: 0 .  lidocaine (LIDODERM) 5 %, Place 1 patch onto the skin every 12 (twelve) hours. Remove & Discard patch within 12 hours or as directed by MD (Patient not taking: Reported on 10/12/2016), Disp: 10 patch, Rfl: 0  Allergies  Allergen Reactions  . Tramadol Rash     ROS  Please see history of present illness for complete discussion of ROS  Objective  Vitals:   11/15/16 1057  BP: 102/60  Pulse: 77  Resp: 16  Temp: 98 F (36.7 C)  TempSrc: Oral  SpO2: 97%  Weight: 154 lb 11.2 oz (70.2 kg)  Height:  (1.626 m)    Physical Exam  Constitutional: She is oriented to person, place, and time and well-developed, well-nourished, and in no distress.  HENT:  Head: Normocephalic and atraumatic.  Cardiovascular: Normal rate, regular rhythm and normal heart sounds.   No murmur heard. Pulmonary/Chest: Effort normal  and breath sounds normal. She has no wheezes. She has no rales.  Musculoskeletal: She exhibits no edema.       Lumbar back: She exhibits no tenderness, no pain and no spasm.       Back:  Neurological: She is alert and oriented to person, place, and time.  Nursing note and vitals reviewed.      Assessment & Plan  1. Lumbar spondylosis Reviewed MRI but patient must consult ordering physician to determine the next course of action based on MRI findings. She verbalized agreement   [name] Asad A. Faylene Kurtz [facility] 11/15/2016 11:10 AM

## 2016-12-18 ENCOUNTER — Ambulatory Visit: Payer: BLUE CROSS/BLUE SHIELD | Admitting: Podiatry

## 2016-12-19 ENCOUNTER — Ambulatory Visit (INDEPENDENT_AMBULATORY_CARE_PROVIDER_SITE_OTHER): Payer: BLUE CROSS/BLUE SHIELD | Admitting: Family Medicine

## 2016-12-19 ENCOUNTER — Encounter: Payer: Self-pay | Admitting: Family Medicine

## 2016-12-19 DIAGNOSIS — K08409 Partial loss of teeth, unspecified cause, unspecified class: Secondary | ICD-10-CM

## 2016-12-19 MED ORDER — NAPROXEN 500 MG PO TBEC: 500.0000 mg | DELAYED_RELEASE_TABLET | Freq: Two times a day (BID) | ORAL | 0 refills | Status: AC

## 2016-12-19 NOTE — Progress Notes (Signed)
Name: Brenda MaresMary A Ellison   MRN: [id number]    DOB: 02/18/1961   Date:12/19/2016       Progress Note  Subjective  Chief Complaint  Chief Complaint  Patient presents with  . Pain    Left side arm, right leg; asking for pain medication     HPI  Pt. presents for complaints of dental pain, she had extraction of two teeth and is now in severe pain, she was advised by the dentist to try Tylenol which she has tried and has been ineffective. She is requesting Ibuprofen 800 mg for relief. Denies any fevers, chills, dyspnea, bleeding, etc.   Past Medical History:  Diagnosis Date  . Arthritis pain   . Bilateral leg pain   . Chronic nasal congestion   . Dizziness of unknown cause   . Heart murmur previously undiagnosed   . Osteoarthritis, hand     Past Surgical History:  Procedure Laterality Date  . NO PAST SURGERIES      Family History  Problem Relation Age of Onset  . Diabetes Brother   . Breast cancer Neg Hx   . Ovarian cancer Neg Hx   . Colon cancer Neg Hx   . Heart disease Neg Hx     Social History   Social History  . Marital status: Single    Spouse name: N/A  . Number of children: N/A  . Years of education: N/A   Occupational History  . Not on file.   Social History Main Topics  . Smoking status: Former Games developermoker  . Smokeless tobacco: Never Used  . Alcohol use No  . Drug use: No  . Sexual activity: Not Currently    Birth control/ protection: Post-menopausal   Other Topics Concern  . Not on file   Social History Narrative   Widowed   Works full time   Carbonated beverages     Current Outpatient Prescriptions:  .  Cholecalciferol (VITAMIN D3) 2000 UNITS capsule, Take by mouth., Disp: , Rfl:  .  ibuprofen (ADVIL,MOTRIN) 600 MG tablet, Take 1 tablet (600 mg total) by mouth every 8 (eight) hours as needed., Disp: 30 tablet, Rfl: 0 .  nystatin cream (MYCOSTATIN), Apply 1 application topically 2 (two) times daily., Disp: 30 g, Rfl: 0 .  lidocaine (LIDODERM) 5  %, Place 1 patch onto the skin every 12 (twelve) hours. Remove & Discard patch within 12 hours or as directed by MD (Patient not taking: Reported on 10/12/2016), Disp: 10 patch, Rfl: 0 .  naproxen sodium (ALEVE) 220 MG tablet, Take 1 tablet by mouth 2 (two) times daily., Disp: , Rfl:   Allergies  Allergen Reactions  . Tramadol Rash     ROS  Please see HPI for complete discussion of ROS.  Objective  Vitals:   12/19/16 0926  BP: 128/68  Pulse: 67  Resp: 16  Temp: 97.9 F (36.6 C)  TempSrc: Oral  SpO2: 97%  Weight: 161 lb 1.6 oz (73.1 kg)  Height: 5\' 4"  (1.626 m)    Physical Exam  Constitutional: She is oriented to person, place, and time and well-developed, well-nourished, and in no distress.  HENT:  Mouth/Throat: Oropharynx is clear and moist.  Tooth extraction from left lower jaw.  Neurological: She is alert and oriented to person, place, and time.  Psychiatric: Mood, memory, affect and judgment normal.  Nursing note and vitals reviewed.      Assessment & Plan  1. S/P tooth extraction Started on high-dose Naproxen for relief of  post operative dental pain. - naproxen (EC NAPROSYN) 500 MG EC tablet; Take 1 tablet (500 mg total) by mouth 2 (two) times daily with a meal.  Dispense: 10 tablet; Refill: 0   Alita Waldren Asad A. [facility] 12/19/2016 9:32 AM

## 2016-12-19 NOTE — Progress Notes (Signed)
The following letter was provided to Collene MaresMary A Barnette during their visit today: ---------------------------------------  Dear valued [facility] Patient,  I am writing to share that as of April 06, 2017, I will no longer be seeing patients at [facility]. While it has been my privilege to care for you as a physician, I have decided to move outside of [place] to pursue other opportunities.  The staff at [facility] has been supportive of my decision and are supportive of any patients who have been under my care.  They will be happy to provide care to you and your family.  The office staff will do everything they can to ensure a seamless transition of care at [facility].  However if you are on any controlled substance medications (i.e. Pain medication or benzodiazepines), they will no longer be able to refill those medications, but we will be more than happy to refer you to a specialist.  [facility] will also assist you with the transfer of medical records should you wish to seek care elsewhere.  If you have any questions about your future care, you may call the office at [phone].  I have enjoyed getting to know my patients here and I wish you the very best.  Sincerely,  Velta AddisonSyed Asad Shah, MD  ---------------------------------------  A written copy of this letter was given to the patient.  The patient verbalizes understanding of the letter, and does request referral to specialist or to new primary care provider.  This decision has been conveyed to Dr. Sherryll BurgerShah, who will place any appropriate referrals during today's visit.

## 2017-01-08 ENCOUNTER — Encounter: Payer: BLUE CROSS/BLUE SHIELD | Admitting: Family Medicine

## 2017-01-17 ENCOUNTER — Ambulatory Visit: Payer: BLUE CROSS/BLUE SHIELD | Admitting: Family Medicine

## 2017-01-17 ENCOUNTER — Encounter: Payer: Self-pay | Admitting: Family Medicine

## 2017-01-17 VITALS — BP 126/64 | HR 62 | Temp 97.5°F | Resp 16 | Ht 64.0 in | Wt 155.8 lb

## 2017-01-17 DIAGNOSIS — Z0000 Encounter for general adult medical examination without abnormal findings: Secondary | ICD-10-CM | POA: Diagnosis not present

## 2017-01-17 DIAGNOSIS — Z1211 Encounter for screening for malignant neoplasm of colon: Secondary | ICD-10-CM

## 2017-01-17 DIAGNOSIS — H6123 Impacted cerumen, bilateral: Secondary | ICD-10-CM

## 2017-01-17 DIAGNOSIS — K08409 Partial loss of teeth, unspecified cause, unspecified class: Secondary | ICD-10-CM

## 2017-01-17 MED ORDER — NAPROXEN 500 MG PO TABS: 500.0000 mg | ORAL_TABLET | Freq: Two times a day (BID) | ORAL | 0 refills | Status: DC

## 2017-01-17 NOTE — Progress Notes (Signed)
Name: Collene MaresMary A Barnette   MRN: [id number]    DOB: 04/13/1960   Date:01/17/2017       Progress Note  Subjective  Chief Complaint  Chief Complaint  Patient presents with  . Annual Exam    HPI  Pt. Presents for Complete Physical Exam.  She is doing well, had her front teeth pulled and is in a lot of pain.  She has Gynecology for female exams and screenings.  Mammogram in February 2018, was BI-RADS 1,  Pap Smear in February 2018, was normal. She is due for colon cancer screening.    Past Medical History:  Diagnosis Date  . Arthritis pain   . Bilateral leg pain   . Chronic nasal congestion   . Dizziness of unknown cause   . Heart murmur previously undiagnosed   . Osteoarthritis, hand     Past Surgical History:  Procedure Laterality Date  . NO PAST SURGERIES      Family History  Problem Relation Age of Onset  . Diabetes Brother   . Breast cancer Neg Hx   . Ovarian cancer Neg Hx   . Colon cancer Neg Hx   . Heart disease Neg Hx     Social History   Socioeconomic History  . Marital status: Single    Spouse name: Not on file  . Number of children: Not on file  . Years of education: Not on file  . Highest education level: Not on file  Social Needs  . Financial resource strain: Not on file  . Food insecurity - worry: Not on file  . Food insecurity - inability: Not on file  . Transportation needs - medical: Not on file  . Transportation needs - non-medical: Not on file  Occupational History  . Not on file  Tobacco Use  . Smoking status: Former Games developermoker  . Smokeless tobacco: Never Used  Substance and Sexual Activity  . Alcohol use: No  . Drug use: No  . Sexual activity: Not Currently    Birth control/protection: Post-menopausal  Other Topics Concern  . Not on file  Social History Narrative   Widowed   Works full time   Carbonated beverages     Current Outpatient Medications:  .  Cholecalciferol (VITAMIN D3) 2000 UNITS capsule, Take by mouth., Disp: , Rfl:   .  ibuprofen (ADVIL,MOTRIN) 600 MG tablet, Take 600 mg by mouth every 6 (six) hours as needed., Disp: , Rfl:  .  naproxen (NAPROSYN) 500 MG tablet, Take 500 mg by mouth 2 (two) times daily with a meal., Disp: , Rfl:  .  lidocaine (LIDODERM) 5 %, Place 1 patch onto the skin every 12 (twelve) hours. Remove & Discard patch within 12 hours or as directed by MD (Patient not taking: Reported on 10/12/2016), Disp: 10 patch, Rfl: 0 .  nystatin cream (MYCOSTATIN), Apply 1 application topically 2 (two) times daily. (Patient not taking: Reported on 01/17/2017), Disp: 30 g, Rfl: 0  Allergies  Allergen Reactions  . Tramadol Rash     Review of Systems  Constitutional: Negative for chills, fever and malaise/fatigue.  HENT: Negative for congestion, ear pain, sinus pain and sore throat.   Eyes: Negative for blurred vision and double vision.  Respiratory: Negative for cough, sputum production and shortness of breath.   Cardiovascular: Negative for chest pain, palpitations and leg swelling.  Gastrointestinal: Negative for abdominal pain, blood in stool, constipation, diarrhea, nausea and vomiting.  Genitourinary: Negative for dysuria and hematuria.  Musculoskeletal: Negative for back  pain and neck pain.  Skin: Negative for rash.  Neurological: Negative for dizziness and headaches.  Psychiatric/Behavioral: Negative for depression. The patient is not nervous/anxious and does not have insomnia.       Objective  Vitals:   01/17/17 1442  BP: 126/64  Pulse: 62  Resp: 16  Temp: (!) 97.5 F (36.4 C)  TempSrc: Oral  SpO2: 98%  Weight: 155 lb 12.8 oz (70.7 kg)  Height: 5\' 4"  (1.626 m)    Physical Exam  Constitutional: She is oriented to person, place, and time and well-developed, well-nourished, and in no distress.  HENT:  Head: Normocephalic and atraumatic.  Mouth/Throat: Oropharynx is clear and moist. No posterior oropharyngeal erythema.  Cerumen impaction in ear canals.  Eyes: Conjunctivae are  normal. Pupils are equal, round, and reactive to light.  Neck: Neck supple.  Cardiovascular: Normal rate, regular rhythm, S1 normal and S2 normal.  Murmur heard.  Systolic murmur is present with a grade of 2/6. Pulmonary/Chest: Breath sounds normal. She has no wheezes. She has no rhonchi.  Abdominal: Soft. Bowel sounds are normal. There is no tenderness.  Musculoskeletal:       Right ankle: She exhibits no swelling.       Left ankle: She exhibits no swelling.  Neurological: She is alert and oriented to person, place, and time.  Psychiatric: Mood, memory, affect and judgment normal.  Nursing note and vitals reviewed.      Assessment & Plan  1. Well woman exam without gynecological exam Obtain age-appropriate laboratory screenings - CBC with Differential/Platelet - TSH - VITAMIN D 25 Hydroxy (Vit-D Deficiency, Fractures) - Lipid panel - COMPLETE METABOLIC PANEL WITH GFR  2. S/P tooth extraction Age and is requesting an opioid for relief of pain, I have advised that I can start her on naproxen but that she will have to speak with her dentist if she wants to get a prescription for opioid - naproxen (NAPROSYN) 500 MG tablet; Take 1 tablet (500 mg total) by mouth 2 (two) times daily with a meal.  Dispense: 14 tablet; Refill: 0  3. Bilateral impacted cerumen   4. Screening for colon cancer  - Ambulatory referral to Gastroenterology   Kathi LudwigSyed Asad A. Faylene KurtzShah [facility] 01/17/2017 2:54 PM

## 2017-01-18 LAB — LIPID PANEL
Cholesterol: 216 mg/dL — ABNORMAL HIGH (ref <200)
HDL: 59 mg/dL (ref >50)
LDL Cholesterol (Calc): 129 mg/dL (calc) — ABNORMAL HIGH
Non-HDL Cholesterol (Calc): 157 mg/dL (calc) — ABNORMAL HIGH (ref <130)
Total CHOL/HDL Ratio: 3.7 (calc) (ref <5.0)
Triglycerides: 157 mg/dL — ABNORMAL HIGH (ref <150)

## 2017-01-18 LAB — TSH: TSH: 0.62 m[IU]/L

## 2017-01-18 LAB — COMPLETE METABOLIC PANEL WITH GFR
AG Ratio: 1.2 (calc) (ref 1.0–2.5)
ALBUMIN MSPROF: 3.9 g/dL (ref 3.6–5.1)
ALT: 39 U/L — ABNORMAL HIGH (ref 6–29)
AST: 28 U/L (ref 10–35)
Alkaline phosphatase (APISO): 127 U/L (ref 33–130)
BUN: 21 mg/dL (ref 7–25)
CO2: 27 mmol/L (ref 20–32)
CREATININE: 0.75 mg/dL (ref 0.50–1.05)
Calcium: 9.6 mg/dL (ref 8.6–10.4)
Chloride: 104 mmol/L (ref 98–110)
GFR, EST AFRICAN AMERICAN: 105 mL/min/{1.73_m2} (ref >=60)
GFR, Est Non African American: 90 mL/min/{1.73_m2} (ref >=60)
GLUCOSE: 88 mg/dL (ref 65–99)
Globulin: 3.3 g/dL (calc) (ref 1.9–3.7)
Potassium: 4 mmol/L (ref 3.5–5.3)
Sodium: 139 mmol/L (ref 135–146)
TOTAL PROTEIN: 7.2 g/dL (ref 6.1–8.1)
Total Bilirubin: 0.4 mg/dL (ref 0.2–1.2)

## 2017-01-18 LAB — CBC WITH DIFFERENTIAL/PLATELET
Basophils Absolute: 43 cells/uL (ref 0–200)
Basophils Relative: 0.6 %
EOS PCT: 2.5 %
Eosinophils Absolute: 178 cells/uL (ref 15–500)
HCT: 37.1 % (ref 35.0–45.0)
Hemoglobin: 12.4 g/dL (ref 11.7–15.5)
Lymphs Abs: 2982 cells/uL (ref 850–3900)
MCH: 29.1 pg (ref 27.0–33.0)
MCHC: 33.4 g/dL (ref 32.0–36.0)
MCV: 87.1 fL (ref 80.0–100.0)
MPV: 11.5 fL (ref 7.5–12.5)
Monocytes Relative: 7.3 %
NEUTROS PCT: 47.6 %
Neutro Abs: 3380 cells/uL (ref 1500–7800)
PLATELETS: 235 10*3/uL (ref 140–400)
RBC: 4.26 10*6/uL (ref 3.80–5.10)
RDW: 12.7 % (ref 11.0–15.0)
TOTAL LYMPHOCYTE: 42 %
WBC mixed population: 518 cells/uL (ref 200–950)
WBC: 7.1 10*3/uL (ref 3.8–10.8)

## 2017-01-18 LAB — VITAMIN D 25 HYDROXY (VIT D DEFICIENCY, FRACTURES): Vit D, 25-Hydroxy: 42 ng/mL (ref 30–100)

## 2017-01-25 ENCOUNTER — Telehealth: Payer: Self-pay

## 2017-01-25 NOTE — Telephone Encounter (Signed)
Copied from CRM [phone]. Topic: Quick Communication - Lab Results >> Jan 24, 2017  8:08 AM Oneal GroutSebastian, Jennifer S wrote: Would like to speak to nurse regarding lab works >> Jan 24, 2017  9:11 AM Cecelia ByarsGreen, Temeka L, RMA wrote: Pt is calling for a second time and would like a call back for lab results

## 2017-01-25 NOTE — Telephone Encounter (Signed)
erroneous

## 2017-01-25 NOTE — Telephone Encounter (Signed)
Spoke to pt on 01/22/2017 regarding her lab results. Left a message to give me a call back and we can discuss any concerns or questions she has.

## 2017-01-25 NOTE — Telephone Encounter (Signed)
Pt just called back an advised her of the message from nurse. She said ok & will call back later.

## 2017-01-26 ENCOUNTER — Telehealth: Payer: Self-pay

## 2017-01-26 NOTE — Telephone Encounter (Signed)
Left a message to let her know since I'm leaving early today I will release her labs to the nurse pool so they can answer her questions and concerns she have.

## 2017-02-05 NOTE — Telephone Encounter (Signed)
Pt. Given lab results and instructions.Verbalizes understanding. 

## 2017-02-26 ENCOUNTER — Telehealth: Payer: Self-pay

## 2017-02-27 NOTE — Telephone Encounter (Signed)
error 

## 2017-03-13 ENCOUNTER — Ambulatory Visit: Payer: BLUE CROSS/BLUE SHIELD | Admitting: Gastroenterology

## 2017-03-16 ENCOUNTER — Other Ambulatory Visit: Payer: Self-pay

## 2017-03-16 DIAGNOSIS — M5416 Radiculopathy, lumbar region: Secondary | ICD-10-CM | POA: Insufficient documentation

## 2017-03-16 DIAGNOSIS — Z1389 Encounter for screening for other disorder: Secondary | ICD-10-CM | POA: Insufficient documentation

## 2017-03-16 DIAGNOSIS — M25549 Pain in joints of unspecified hand: Secondary | ICD-10-CM | POA: Insufficient documentation

## 2017-03-16 DIAGNOSIS — E559 Vitamin D deficiency, unspecified: Secondary | ICD-10-CM | POA: Insufficient documentation

## 2017-03-16 DIAGNOSIS — Z789 Other specified health status: Secondary | ICD-10-CM | POA: Insufficient documentation

## 2017-03-16 DIAGNOSIS — IMO0001 Reserved for inherently not codable concepts without codable children: Secondary | ICD-10-CM | POA: Insufficient documentation

## 2017-03-16 DIAGNOSIS — R011 Cardiac murmur, unspecified: Secondary | ICD-10-CM | POA: Insufficient documentation

## 2017-03-19 ENCOUNTER — Ambulatory Visit: Payer: Self-pay | Admitting: Gastroenterology

## 2017-03-26 NOTE — Progress Notes (Deleted)
ANNUAL PREVENTATIVE CARE GYN  ENCOUNTER NOTE    Subjective:       Brenda Ellison is a 57 y.o. G23P3003 female here for a routine annual gynecologic exam.  Current complaints: 1.  Well woman annual exam   Patient is a postmenopausal Caucasian female that presents for her annual exam. Pt. does not report any new vaginal discharge, vaginal or pelvic pain, or postmenopausal sx.  Infrequently sexually active with multiple partners but without any protection or contraception.  Gynecologic History No LMP recorded. Patient is postmenopausal. Contraception: none Sexually active but no current method of protection  Last Pap: 03/2016 neg/neg  Results were: normal Last mammogram: 03/2016 birad 1. Results were: normal  Obstetric History OB History  Gravida Para Term Preterm AB Living  3 3 3     3   SAB TAB Ectopic Multiple Live Births          3    # Outcome Date GA Lbr Len/2nd Weight Sex Delivery Anes PTL Lv  3 Term 1987   8 lb 4.8 oz (3.765 kg) M Vag-Spont   LIV  2 Term 1985   6 lb 11.2 oz (3.039 kg) M Vag-Spont   LIV  1 Term 1983   6 lb 11.2 oz (3.039 kg) M Vag-Spont   LIV      Past Medical History:  Diagnosis Date  . Arthritis pain   . Bilateral leg pain   . Chronic nasal congestion   . Dizziness of unknown cause   . Heart murmur previously undiagnosed   . Osteoarthritis, hand     Past Surgical History:  Procedure Laterality Date  . NO PAST SURGERIES      Current Outpatient Medications on File Prior to Visit  Medication Sig Dispense Refill  . acetaminophen (TYLENOL) 325 MG tablet Take by mouth.    . Cholecalciferol (VITAMIN D3) 2000 UNITS capsule Take by mouth.    Marland Kitchen ibuprofen (ADVIL,MOTRIN) 600 MG tablet Take 600 mg by mouth every 6 (six) hours as needed.    . magic mouthwash w/lidocaine SOLN Take by mouth.    . meloxicam (MOBIC) 7.5 MG tablet Take by mouth.    . naproxen (NAPROSYN) 500 MG tablet Take 1 tablet (500 mg total) by mouth 2 (two) times daily with a meal. 14 tablet 0   . naproxen sodium (ALEVE) 220 MG tablet Take by mouth.    . nystatin cream (MYCOSTATIN) Apply 1 application topically 2 (two) times daily. (Patient not taking: Reported on 01/17/2017) 30 g 0   No current facility-administered medications on file prior to visit.     Allergies  Allergen Reactions  . Tramadol Rash    Social History   Socioeconomic History  . Marital status: Single    Spouse name: Not on file  . Number of children: Not on file  . Years of education: Not on file  . Highest education level: Not on file  Social Needs  . Financial resource strain: Not on file  . Food insecurity - worry: Not on file  . Food insecurity - inability: Not on file  . Transportation needs - medical: Not on file  . Transportation needs - non-medical: Not on file  Occupational History  . Not on file  Tobacco Use  . Smoking status: Former Games developer  . Smokeless tobacco: Never Used  Substance and Sexual Activity  . Alcohol use: No  . Drug use: No  . Sexual activity: Not Currently    Birth control/protection: Post-menopausal  Other Topics Concern  . Not on file  Social History Narrative   Widowed   Works full time   Carbonated beverages    Family History  Problem Relation Age of Onset  . Diabetes Brother   . Breast cancer Neg Hx   . Ovarian cancer Neg Hx   . Colon cancer Neg Hx   . Heart disease Neg Hx     The following portions of the patient's history were reviewed and updated as appropriate: allergies, current medications, past family history, past medical history, past social history, past surgical history and problem list.  Review of Systems ROS   Objective:   There were no vitals taken for this visit. CONSTITUTIONAL: Well-developed, well-nourished female in no acute distress.  PSYCHIATRIC: Normal mood and affect. Normal behavior. Normal judgment and thought content. NEUROLGIC: Alert and oriented to person, place, and time. Normal muscle tone coordination. No cranial  nerve deficit noted. HENT:  Normocephalic, atraumatic, External right and left ear normal. Oropharynx is clear and moist EYES: Conjunctivae and EOM are normal. Pupils are equal, round, and reactive to light. No scleral icterus.  NECK: Normal range of motion, supple, no masses.  Normal thyroid.  SKIN: Skin is warm and dry. No rash noted. Some skin changes noted left axillary and below the breast but are not new to patient. Not diaphoretic. No erythema. No pallor. CARDIOVASCULAR: Normal heart rate noted, regular rhythm, slight murmur,Low pitch RESPIRATORY: Clear to auscultation bilaterally. Effort and breath sounds normal, no problems with respiration noted. BREASTS: Symmetric in size. No masses, skin changes, nipple drainage, or lymphadenopathy. ABDOMEN: Soft, normal bowel and percussion sounds, no distention noted.  No tenderness, rebound or guarding.  BLADDER: Normal PELVIC:  External Genitalia: Normal.  BUS: Normal  Vagina: Signs of vaginal atrophy d/t decreased estrogen effect  Cervix: Normal; no cervical motion tenderness   Uterus: Normal; midplane, normal size and shape, mobile  Adnexa: Normal; nonpalpable and nontender  RV: External Exam NormaI, No Rectal Masses and Normal Sphincter tone  MUSCULOSKELETAL: Normal range of motion. No tenderness.  No cyanosis, clubbing, or edema.  2+ distal pulses. Some numbness in L 4th and 5th digit possibly d/t ulnar irritation with muscle tension from arthritis.  LYMPHATIC: No Axillary, Supraclavicular, or Inguinal Adenopathy.    Assessment:   1. Annual gynecologic examination 57 y.o. post menopausal 423P3003 Caucasian female sexually active without protection.  2. Contraception: none and infrequently sexually active with different partners. 3. Overweight 4. Menopause 5. Vaginal atrophy, minimally symptomatic  Plan:  1. Pap: Due 2021 2. Mammogram: Ordered 3. Stool Guaiac Testing:  Ordered 4. Labs: thru pcp 5. Routine preventative health  maintenance measures emphasized: Tobacco Warnings, Stress Management, Peer Pressure Issues and Safe Sex. Education also provided for postmenopausal health maintenance and osteoporosis awareness and prevention. Information also provided for cervical, uterine, lung, colorectal, skin and ovarian CA education.  6. Lubricants as needed for vaginal atrophy Return to Clinic - 1 Year   Leo Fray [place], CMA   I have seen, interviewed, and examined the patient in conjunction with the [employer] student and affirm the diagnosis and management plan. Martin A. DeFrancesco, MD, FACOG   Note: This dictation was prepared with Dragon dictation along with smaller phrase technology. Any transcriptional errors that result from this process are unintentional.

## 2017-03-28 ENCOUNTER — Encounter: Payer: BLUE CROSS/BLUE SHIELD | Admitting: Obstetrics and Gynecology

## 2017-04-04 ENCOUNTER — Ambulatory Visit: Payer: Self-pay | Admitting: Gastroenterology

## 2017-04-04 ENCOUNTER — Encounter: Payer: Self-pay | Admitting: Gastroenterology

## 2017-04-04 ENCOUNTER — Encounter: Payer: Self-pay | Admitting: Obstetrics and Gynecology

## 2017-04-06 NOTE — Progress Notes (Deleted)
ANNUAL PREVENTATIVE CARE GYN  ENCOUNTER NOTE    Subjective:       Brenda Ellison is a 57 y.o. G23P3003 female here for a routine annual gynecologic exam.  Current complaints: 1.  Well woman annual exam   Patient is a postmenopausal Caucasian female that presents for her annual exam. Pt. does not report any new vaginal discharge, vaginal or pelvic pain, or postmenopausal sx.  Infrequently sexually active with multiple partners but without any protection or contraception.  Gynecologic History No LMP recorded. Patient is postmenopausal. Contraception: none Sexually active but no current method of protection  Last Pap: 03/2016 neg/neg  Results were: normal Last mammogram: 03/2016 birad 1. Results were: normal  Obstetric History OB History  Gravida Para Term Preterm AB Living  3 3 3     3   SAB TAB Ectopic Multiple Live Births          3    # Outcome Date GA Lbr Len/2nd Weight Sex Delivery Anes PTL Lv  3 Term 1987   8 lb 4.8 oz (3.765 kg) M Vag-Spont   LIV  2 Term 1985   6 lb 11.2 oz (3.039 kg) M Vag-Spont   LIV  1 Term 1983   6 lb 11.2 oz (3.039 kg) M Vag-Spont   LIV      Past Medical History:  Diagnosis Date  . Arthritis pain   . Bilateral leg pain   . Chronic nasal congestion   . Dizziness of unknown cause   . Heart murmur previously undiagnosed   . Osteoarthritis, hand     Past Surgical History:  Procedure Laterality Date  . NO PAST SURGERIES      Current Outpatient Medications on File Prior to Visit  Medication Sig Dispense Refill  . acetaminophen (TYLENOL) 325 MG tablet Take by mouth.    . Cholecalciferol (VITAMIN D3) 2000 UNITS capsule Take by mouth.    Marland Kitchen ibuprofen (ADVIL,MOTRIN) 600 MG tablet Take 600 mg by mouth every 6 (six) hours as needed.    . magic mouthwash w/lidocaine SOLN Take by mouth.    . meloxicam (MOBIC) 7.5 MG tablet Take by mouth.    . naproxen (NAPROSYN) 500 MG tablet Take 1 tablet (500 mg total) by mouth 2 (two) times daily with a meal. 14 tablet 0   . naproxen sodium (ALEVE) 220 MG tablet Take by mouth.    . nystatin cream (MYCOSTATIN) Apply 1 application topically 2 (two) times daily. (Patient not taking: Reported on 01/17/2017) 30 g 0   No current facility-administered medications on file prior to visit.     Allergies  Allergen Reactions  . Tramadol Rash    Social History   Socioeconomic History  . Marital status: Single    Spouse name: Not on file  . Number of children: Not on file  . Years of education: Not on file  . Highest education level: Not on file  Social Needs  . Financial resource strain: Not on file  . Food insecurity - worry: Not on file  . Food insecurity - inability: Not on file  . Transportation needs - medical: Not on file  . Transportation needs - non-medical: Not on file  Occupational History  . Not on file  Tobacco Use  . Smoking status: Former Games developer  . Smokeless tobacco: Never Used  Substance and Sexual Activity  . Alcohol use: No  . Drug use: No  . Sexual activity: Not Currently    Birth control/protection: Post-menopausal  Other Topics Concern  . Not on file  Social History Narrative   Widowed   Works full time   Carbonated beverages    Family History  Problem Relation Age of Onset  . Diabetes Brother   . Breast cancer Neg Hx   . Ovarian cancer Neg Hx   . Colon cancer Neg Hx   . Heart disease Neg Hx     The following portions of the patient's history were reviewed and updated as appropriate: allergies, current medications, past family history, past medical history, past social history, past surgical history and problem list.  Review of Systems ROS   Objective:   There were no vitals taken for this visit. CONSTITUTIONAL: Well-developed, well-nourished female in no acute distress.  PSYCHIATRIC: Normal mood and affect. Normal behavior. Normal judgment and thought content. NEUROLGIC: Alert and oriented to person, place, and time. Normal muscle tone coordination. No cranial  nerve deficit noted. HENT:  Normocephalic, atraumatic, External right and left ear normal. Oropharynx is clear and moist EYES: Conjunctivae and EOM are normal. Pupils are equal, round, and reactive to light. No scleral icterus.  NECK: Normal range of motion, supple, no masses.  Normal thyroid.  SKIN: Skin is warm and dry. No rash noted. Some skin changes noted left axillary and below the breast but are not new to patient. Not diaphoretic. No erythema. No pallor. CARDIOVASCULAR: Normal heart rate noted, regular rhythm, slight murmur,Low pitch RESPIRATORY: Clear to auscultation bilaterally. Effort and breath sounds normal, no problems with respiration noted. BREASTS: Symmetric in size. No masses, skin changes, nipple drainage, or lymphadenopathy. ABDOMEN: Soft, normal bowel and percussion sounds, no distention noted.  No tenderness, rebound or guarding.  BLADDER: Normal PELVIC:  External Genitalia: Normal.  BUS: Normal  Vagina: Signs of vaginal atrophy d/t decreased estrogen effect  Cervix: Normal; no cervical motion tenderness   Uterus: Normal; midplane, normal size and shape, mobile  Adnexa: Normal; nonpalpable and nontender  RV: External Exam NormaI, No Rectal Masses and Normal Sphincter tone  MUSCULOSKELETAL: Normal range of motion. No tenderness.  No cyanosis, clubbing, or edema.  2+ distal pulses. Some numbness in L 4th and 5th digit possibly d/t ulnar irritation with muscle tension from arthritis.  LYMPHATIC: No Axillary, Supraclavicular, or Inguinal Adenopathy.    Assessment:   1. Annual gynecologic examination 57 y.o. post menopausal 423P3003 Caucasian female sexually active without protection.  2. Contraception: none and infrequently sexually active with different partners. 3. Overweight 4. Menopause 5. Vaginal atrophy, minimally symptomatic  Plan:  1. Pap: Due 2021 2. Mammogram: Ordered 3. Stool Guaiac Testing:  Ordered 4. Labs: thru pcp 5. Routine preventative health  maintenance measures emphasized: Tobacco Warnings, Stress Management, Peer Pressure Issues and Safe Sex. Education also provided for postmenopausal health maintenance and osteoporosis awareness and prevention. Information also provided for cervical, uterine, lung, colorectal, skin and ovarian CA education.  6. Lubricants as needed for vaginal atrophy Return to Clinic - 1 Year   Patterson Hollenbaugh [place], CMA   I have seen, interviewed, and examined the patient in conjunction with the [employer] [facility] and affirm the diagnosis and management plan. Martin A. DeFrancesco, MD, FACOG   Note: This dictation was prepared with Dragon dictation along with smaller phrase technology. Any transcriptional errors that result from this process are unintentional.

## 2017-04-11 ENCOUNTER — Encounter: Payer: Self-pay | Admitting: Obstetrics and Gynecology

## 2017-04-11 ENCOUNTER — Telehealth: Payer: Self-pay

## 2017-04-11 DIAGNOSIS — Z1211 Encounter for screening for malignant neoplasm of colon: Secondary | ICD-10-CM

## 2017-04-11 NOTE — Telephone Encounter (Signed)
Copied from CRM [id number]. Topic: General - Other >> Apr 11, 2017  3:58 PM Gerrianne ScalePayne, Angela L wrote: Reason for CRM: patient would like a referral to a GI doctor stating that she is having problems down there she didn't say what it was at one point she said her butt patient was talking so fast and asking questions about Dr Sherryll BurgerShah she didn't go into details   Called pt inquired about several appts that she has cancelled with GI. Pt states that she has just started a new job and was unable to take any time off. Pt states that she doesn't get off work until 2pm. She is in need of a colonoscopy. Will place referral again with pt's preferences on time and location. (Prefers [place])

## 2017-04-17 ENCOUNTER — Ambulatory Visit: Payer: BLUE CROSS/BLUE SHIELD | Admitting: Family Medicine

## 2017-04-17 ENCOUNTER — Ambulatory Visit: Payer: Self-pay | Admitting: Family Medicine

## 2017-04-20 ENCOUNTER — Encounter: Payer: Self-pay | Admitting: *Deleted

## 2017-04-21 ENCOUNTER — Other Ambulatory Visit: Payer: Self-pay

## 2017-04-21 DIAGNOSIS — Z1211 Encounter for screening for malignant neoplasm of colon: Secondary | ICD-10-CM

## 2017-04-30 ENCOUNTER — Telehealth: Payer: Self-pay

## 2017-04-30 NOTE — Telephone Encounter (Signed)
Gastroenterology Pre-Procedure Review  Request Date: 05/09/17 Requesting Physician: Dr. Tobi BastosAnna  PATIENT REVIEW QUESTIONS: The patient responded to the following health history questions as indicated:    1. Are you having any GI issues? yes (Bloating) 2. Do you have a personal history of Polyps? no 3. Do you have a family history of Colon Cancer or Polyps? no 4. Diabetes Mellitus? no 5. Joint replacements in the past 12 months?no 6. Major health problems in the past 3 months?no 7. Any artificial heart valves, MVP, or defibrillator?No, past history heart murmur    MEDICATIONS & ALLERGIES:    Patient reports the following regarding taking any anticoagulation/antiplatelet therapy:   Plavix, Coumadin, Eliquis, Xarelto, Lovenox, Pradaxa, Brilinta, or Effient? no Aspirin? no  Patient confirms/reports the following medications:  Current Outpatient Medications  Medication Sig Dispense Refill  . acetaminophen (TYLENOL) 325 MG tablet Take by mouth.    . Cholecalciferol (VITAMIN D3) 2000 UNITS capsule Take by mouth.    Marland Kitchen. ibuprofen (ADVIL,MOTRIN) 600 MG tablet Take 600 mg by mouth every 6 (six) hours as needed.    . magic mouthwash w/lidocaine SOLN Take by mouth.    . meloxicam (MOBIC) 7.5 MG tablet Take by mouth.    . naproxen (NAPROSYN) 500 MG tablet Take 1 tablet (500 mg total) by mouth 2 (two) times daily with a meal. 14 tablet 0  . naproxen sodium (ALEVE) 220 MG tablet Take by mouth.    . nystatin cream (MYCOSTATIN) Apply 1 application topically 2 (two) times daily. (Patient not taking: Reported on 01/17/2017) 30 g 0   No current facility-administered medications for this visit.     Patient confirms/reports the following allergies:  Allergies  Allergen Reactions  . Tramadol Rash    No orders of the defined types were placed in this encounter.   AUTHORIZATION INFORMATION Primary Insurance: 1D#: Group #:  Secondary Insurance: 1D#: Group #:  SCHEDULE INFORMATION: Date:  05/09/17  Time: Location:[facility]

## 2017-04-30 NOTE — Progress Notes (Deleted)
ANNUAL PREVENTATIVE CARE GYN  ENCOUNTER NOTE    Subjective:       Brenda Ellison is a 57 y.o. G56P3003 female here for a routine annual gynecologic exam.  Current complaints: 1.  Well woman annual exam   Patient is a postmenopausal Caucasian female that presents for her annual exam. Pt. does not report any new vaginal discharge, vaginal or pelvic pain, or postmenopausal sx.  Infrequently sexually active with multiple partners but without any protection or contraception.  Gynecologic History No LMP recorded. Patient is postmenopausal. Contraception: none Sexually active but no current method of protection  Last Pap: 03/2016 neg/neg  Results were: normal Last mammogram: 03/2016 birad 1. Results were: normal  Obstetric History OB History  Gravida Para Term Preterm AB Living  3 3 3     3   SAB TAB Ectopic Multiple Live Births          3    # Outcome Date GA Lbr Len/2nd Weight Sex Delivery Anes PTL Lv  3 Term 1987   8 lb 4.8 oz (3.765 kg) M Vag-Spont   LIV  2 Term 1985   6 lb 11.2 oz (3.039 kg) M Vag-Spont   LIV  1 Term 1983   6 lb 11.2 oz (3.039 kg) M Vag-Spont   LIV      Past Medical History:  Diagnosis Date  . Arthritis pain   . Bilateral leg pain   . Chronic nasal congestion   . Dizziness of unknown cause   . Heart murmur previously undiagnosed   . Osteoarthritis, hand     Past Surgical History:  Procedure Laterality Date  . NO PAST SURGERIES      Current Outpatient Medications on File Prior to Visit  Medication Sig Dispense Refill  . acetaminophen (TYLENOL) 325 MG tablet Take by mouth.    . Cholecalciferol (VITAMIN D3) 2000 UNITS capsule Take by mouth.    Marland Kitchen ibuprofen (ADVIL,MOTRIN) 600 MG tablet Take 600 mg by mouth every 6 (six) hours as needed.    . magic mouthwash w/lidocaine SOLN Take by mouth.    . meloxicam (MOBIC) 7.5 MG tablet Take by mouth.    . naproxen (NAPROSYN) 500 MG tablet Take 1 tablet (500 mg total) by mouth 2 (two) times daily with a meal. 14 tablet 0   . naproxen sodium (ALEVE) 220 MG tablet Take by mouth.    . nystatin cream (MYCOSTATIN) Apply 1 application topically 2 (two) times daily. (Patient not taking: Reported on 01/17/2017) 30 g 0   No current facility-administered medications on file prior to visit.     Allergies  Allergen Reactions  . Tramadol Rash    Social History   Socioeconomic History  . Marital status: Single    Spouse name: Not on file  . Number of children: Not on file  . Years of education: Not on file  . Highest education level: Not on file  Social Needs  . Financial resource strain: Not on file  . Food insecurity - worry: Not on file  . Food insecurity - inability: Not on file  . Transportation needs - medical: Not on file  . Transportation needs - non-medical: Not on file  Occupational History  . Not on file  Tobacco Use  . Smoking status: Former Games developer  . Smokeless tobacco: Never Used  Substance and Sexual Activity  . Alcohol use: No  . Drug use: No  . Sexual activity: Not Currently    Birth control/protection: Post-menopausal  Other Topics Concern  . Not on file  Social History Narrative   Widowed   Works full time   Carbonated beverages    Family History  Problem Relation Age of Onset  . Diabetes Brother   . Breast cancer Neg Hx   . Ovarian cancer Neg Hx   . Colon cancer Neg Hx   . Heart disease Neg Hx     The following portions of the patient's history were reviewed and updated as appropriate: allergies, current medications, past family history, past medical history, past social history, past surgical history and problem list.  Review of Systems ROS   Objective:   There were no vitals taken for this visit. CONSTITUTIONAL: Well-developed, well-nourished female in no acute distress.  PSYCHIATRIC: Normal mood and affect. Normal behavior. Normal judgment and thought content. NEUROLGIC: Alert and oriented to person, place, and time. Normal muscle tone coordination. No cranial  nerve deficit noted. HENT:  Normocephalic, atraumatic, External right and left ear normal. Oropharynx is clear and moist EYES: Conjunctivae and EOM are normal. Pupils are equal, round, and reactive to light. No scleral icterus.  NECK: Normal range of motion, supple, no masses.  Normal thyroid.  SKIN: Skin is warm and dry. No rash noted. Some skin changes noted left axillary and below the breast but are not new to patient. Not diaphoretic. No erythema. No pallor. CARDIOVASCULAR: Normal heart rate noted, regular rhythm, slight murmur,Low pitch RESPIRATORY: Clear to auscultation bilaterally. Effort and breath sounds normal, no problems with respiration noted. BREASTS: Symmetric in size. No masses, skin changes, nipple drainage, or lymphadenopathy. ABDOMEN: Soft, normal bowel and percussion sounds, no distention noted.  No tenderness, rebound or guarding.  BLADDER: Normal PELVIC:  External Genitalia: Normal.  BUS: Normal  Vagina: Signs of vaginal atrophy d/t decreased estrogen effect  Cervix: Normal; no cervical motion tenderness   Uterus: Normal; midplane, normal size and shape, mobile  Adnexa: Normal; nonpalpable and nontender  RV: External Exam NormaI, No Rectal Masses and Normal Sphincter tone  MUSCULOSKELETAL: Normal range of motion. No tenderness.  No cyanosis, clubbing, or edema.  2+ distal pulses. Some numbness in L 4th and 5th digit possibly d/t ulnar irritation with muscle tension from arthritis.  LYMPHATIC: No Axillary, Supraclavicular, or Inguinal Adenopathy.    Assessment:   1. Annual gynecologic examination 57 y.o. post menopausal [id number] Caucasian female sexually active without protection.  2. Contraception: none and infrequently sexually active with different partners. 3. Overweight 4. Menopause 5. Vaginal atrophy, minimally symptomatic  Plan:  1. Pap: Due 2021 2. Mammogram: Ordered 3. Stool Guaiac Testing:  Ordered 4. Labs: thru pcp 5. Routine preventative health  maintenance measures emphasized: Tobacco Warnings, Stress Management, Peer Pressure Issues and Safe Sex. Education also provided for postmenopausal health maintenance and osteoporosis awareness and prevention. Information also provided for cervical, uterine, lung, colorectal, skin and ovarian CA education.  6. Lubricants as needed for vaginal atrophy Return to Clinic - 1 Year   Tareva Leske [place], CMA   I have seen, interviewed, and examined the patient in conjunction with the [employer] student and affirm the diagnosis and management plan. Martin A. DeFrancesco, MD, FACOG   Note: This dictation was prepared with Dragon dictation along with smaller phrase technology. Any transcriptional errors that result from this process are unintentional.

## 2017-05-02 ENCOUNTER — Encounter: Payer: Self-pay | Admitting: Obstetrics and Gynecology

## 2017-05-03 ENCOUNTER — Encounter: Payer: Self-pay | Admitting: Family Medicine

## 2017-05-03 ENCOUNTER — Ambulatory Visit (INDEPENDENT_AMBULATORY_CARE_PROVIDER_SITE_OTHER): Payer: Self-pay | Admitting: Family Medicine

## 2017-05-03 VITALS — BP 126/82 | HR 68 | Temp 97.4°F | Resp 14 | Wt 161.4 lb

## 2017-05-03 DIAGNOSIS — Z1231 Encounter for screening mammogram for malignant neoplasm of breast: Secondary | ICD-10-CM

## 2017-05-03 DIAGNOSIS — R7303 Prediabetes: Secondary | ICD-10-CM

## 2017-05-03 DIAGNOSIS — E782 Mixed hyperlipidemia: Secondary | ICD-10-CM

## 2017-05-03 DIAGNOSIS — M19041 Primary osteoarthritis, right hand: Secondary | ICD-10-CM

## 2017-05-03 DIAGNOSIS — R739 Hyperglycemia, unspecified: Secondary | ICD-10-CM

## 2017-05-03 DIAGNOSIS — Z1239 Encounter for other screening for malignant neoplasm of breast: Secondary | ICD-10-CM

## 2017-05-03 DIAGNOSIS — M19042 Primary osteoarthritis, left hand: Secondary | ICD-10-CM

## 2017-05-03 DIAGNOSIS — I10 Essential (primary) hypertension: Secondary | ICD-10-CM

## 2017-05-03 DIAGNOSIS — I340 Nonrheumatic mitral (valve) insufficiency: Secondary | ICD-10-CM

## 2017-05-03 DIAGNOSIS — Z5181 Encounter for therapeutic drug level monitoring: Secondary | ICD-10-CM

## 2017-05-03 DIAGNOSIS — K08409 Partial loss of teeth, unspecified cause, unspecified class: Secondary | ICD-10-CM

## 2017-05-03 DIAGNOSIS — Z833 Family history of diabetes mellitus: Secondary | ICD-10-CM

## 2017-05-03 NOTE — Patient Instructions (Addendum)
You can only take ONE type of non-steroidal anti-inflammatory pills These include Advil, Motrin, ibuprofen, Aleve, naproxen, etc. Stick with just ONE and get the rest out of your house Aleve is okay to take, one or two at a time up to twice a day; maximum is four pills every 24 hours  Tylenol is okay, per package directions Tylenol is not an anti-inflammatory  Try to lose five pounds over the next few months  Do see your heart doctor when due  Try to limit saturated fats in your diet (bologna, hot dogs, barbeque, cheeseburgers, hamburgers, steak, bacon, sausage, cheese, etc.) and get more fresh fruits, vegetables, and whole grains  Please do call to schedule your mammogram; the number to schedule one at [facility] or [facility] is [phone]

## 2017-05-03 NOTE — Assessment & Plan Note (Signed)
Check A1c, try to lose five pounds

## 2017-05-03 NOTE — Progress Notes (Signed)
BP 126/82   Pulse 68   Temp (!) 97.4 F (36.3 C) (Oral)   Resp 14   Wt 161 lb 6.4 oz (73.2 kg)   SpO2 97%   BMI 27.70 kg/m    Subjective:    Patient ID: Brenda MaresMary A Barnette, female    DOB: 01/05/1961, 57 y.o.   MRN: [id number]  HPI: Brenda MaresMary A Barnette is a 57 y.o. female  Chief Complaint  Patient presents with  . Follow-up    HPI Patient is new to me; previous provider left our practice Patient got a letter in the mail for her colonoscopy She called back and will do next week; no bleeding, just screening; no polyps with last colonoscopy; no fam hx of colon cancer  Arthritis pain; left shoulder, all over, but left side is really the worst She had four different NSAIDs in her medicine list at check-in, plus tylenol  She sees Dr. Gwen PoundsKowalski, cardiologist; cardiologist wants her to take aleve if she needs something; mitral insufficiency; seeing him yearly  She had four teeth pulled, getting ready to get dentures; they told her to take ibuprofen; I immediately told her she cannot take more than one NSAID; see AVS  HTN; controlled; does eat salt  High cholesterol; eats bacon, sausage, cheese Lab Results  Component Value Date   CHOL 216 (H) 01/17/2017   HDL 59 01/17/2017   LDLCALC 129 (H) 01/17/2017   TRIG 157 (H) 01/17/2017   CHOLHDL 3.7 01/17/2017   Prediabetes; fam hx of diabetes; little bit of dry mouth; some sugary drinks Lab Results  Component Value Date   HGBA1C 6.1 07/12/2016     [facility] 05/03/2017 01/17/2017 12/19/2016 11/15/2016 10/12/2016  Decreased Interest 0 0 0 0 0  Down, Depressed, Hopeless 0 0 0 0 0  PHQ - 2 Score 0 0 0 0 0    Relevant past medical, surgical, family and social history reviewed Past Medical History:  Diagnosis Date  . Arthritis pain   . Bilateral leg pain   . Chronic nasal congestion   . Dizziness of unknown cause   . Heart murmur previously undiagnosed   . Osteoarthritis, hand    Past Surgical History:  Procedure  Laterality Date  . NO PAST SURGERIES     Family History  Problem Relation Age of Onset  . Diabetes Brother   . Heart attack Mother   . Breast cancer Neg Hx   . Ovarian cancer Neg Hx   . Colon cancer Neg Hx   . Heart disease Neg Hx    Social History   Tobacco Use  . Smoking status: Former Games developermoker  . Smokeless tobacco: Never Used  Substance Use Topics  . Alcohol use: No  . Drug use: No    Interim medical history since last visit reviewed. Allergies and medications reviewed  Review of Systems Per HPI unless specifically indicated above     Objective:    BP 126/82   Pulse 68   Temp (!) 97.4 F (36.3 C) (Oral)   Resp 14   Wt 161 lb 6.4 oz (73.2 kg)   SpO2 97%   BMI 27.70 kg/m   Wt Readings from Last 3 Encounters:  05/03/17 161 lb 6.4 oz (73.2 kg)  01/17/17 155 lb 12.8 oz (70.7 kg)  12/19/16 161 lb 1.6 oz (73.1 kg)    Physical Exam  Constitutional: She appears well-developed and well-nourished. No distress.  HENT:  Head: Normocephalic and atraumatic.  Healing dental extraction  sites left side upper jawline; no drainage, no excessive erythma  Eyes: EOM are normal. No scleral icterus.  Neck: No thyromegaly present.  Cardiovascular: Normal rate and regular rhythm.  Murmur heard.  Systolic murmur is present with a grade of 3/6. Pulmonary/Chest: Effort normal and breath sounds normal. No respiratory distress. She has no wheezes.  Abdominal: Soft. Bowel sounds are normal. She exhibits no distension.  Musculoskeletal: Normal range of motion. She exhibits no edema.       Right hand: She exhibits deformity (significant heberden's and bouchard's nodes).       Left hand: She exhibits deformity (significant heberden's and bouchard's nodes).  Neurological: She is alert. She exhibits normal muscle tone.  Skin: Skin is warm and dry. She is not diaphoretic. No pallor.  Psychiatric: She has a normal mood and affect. Her mood appears not anxious. She does not exhibit a depressed  mood.   Results for orders placed or performed in visit on 01/17/17  CBC with Differential/Platelet  Result Value Ref Range   WBC 7.1 3.8 - 10.8 Thousand/uL   RBC 4.26 3.80 - 5.10 Million/uL   Hemoglobin 12.4 11.7 - 15.5 g/dL   HCT 16.1 09.6 - 04.5 %   MCV 87.1 80.0 - 100.0 fL   MCH 29.1 27.0 - 33.0 pg   MCHC 33.4 32.0 - 36.0 g/dL   RDW 40.9 81.1 - 91.4 %   Platelets 235 140 - 400 Thousand/uL   MPV 11.5 7.5 - 12.5 fL   Neutro Abs 3,380 1,500 - 7,800 cells/uL   Lymphs Abs 2,982 850 - 3,900 cells/uL   WBC mixed population 518 200 - 950 cells/uL   Eosinophils Absolute 178 15 - 500 cells/uL   Basophils Absolute 43 0 - 200 cells/uL   Neutrophils Relative % 47.6 %   Total Lymphocyte 42.0 %   Monocytes Relative 7.3 %   Eosinophils Relative 2.5 %   Basophils Relative 0.6 %  TSH  Result Value Ref Range   TSH 0.62 mIU/L  VITAMIN D 25 Hydroxy (Vit-D Deficiency, Fractures)  Result Value Ref Range   Vit D, 25-Hydroxy 42 30 - 100 ng/mL  Lipid panel  Result Value Ref Range   Cholesterol 216 (H) <200 mg/dL   HDL 59 >78 mg/dL   Triglycerides 295 (H) <150 mg/dL   LDL Cholesterol (Calc) 129 (H) mg/dL (calc)   Total CHOL/HDL Ratio 3.7 <5.0 (calc)   Non-HDL Cholesterol (Calc) 157 (H) <130 mg/dL (calc)  COMPLETE METABOLIC PANEL WITH GFR  Result Value Ref Range   Glucose, Bld 88 65 - 99 mg/dL   BUN 21 7 - 25 mg/dL   Creat 6.21 3.08 - 6.57 mg/dL   GFR, Est Non African American 90 > OR = 60 mL/min/1.71m2   GFR, Est African American 105 > OR = 60 mL/min/1.85m2   BUN/Creatinine Ratio NOT APPLICABLE 6 - 22 (calc)   Sodium 139 135 - 146 mmol/L   Potassium 4.0 3.5 - 5.3 mmol/L   Chloride 104 98 - 110 mmol/L   CO2 27 20 - 32 mmol/L   Calcium 9.6 8.6 - 10.4 mg/dL   Total Protein 7.2 6.1 - 8.1 g/dL   Albumin 3.9 3.6 - 5.1 g/dL   Globulin 3.3 1.9 - 3.7 g/dL (calc)   AG Ratio 1.2 1.0 - 2.5 (calc)   Total Bilirubin 0.4 0.2 - 1.2 mg/dL   Alkaline phosphatase (APISO) 127 33 - 130 U/L   AST 28 10  - 35 U/L   ALT  39 (H) 6 - 29 U/L      Assessment & Plan:   Problem List Items Addressed This Visit      Cardiovascular and Mediastinum   Moderate mitral insufficiency    Following with her cardiologist      Benign essential HTN    Limit salt        Musculoskeletal and Integument   Arthritis of hand, degenerative    Monitored with rheumatologist; only take ONE NSAID; she opted for ALEVE only; get the rest of the house; okay to take tylenol per package directions      Relevant Medications   acetaminophen (TYLENOL) 325 MG tablet   naproxen sodium (ALEVE) 220 MG tablet     Other   S/P tooth extraction    Healing without complication; only use ONE NSAID      Prediabetes - Primary    Check A1c today, try to lose five pounds      Relevant Orders   Hemoglobin A1c   Mixed hyperlipidemia    Check lipids today; avoid saturated fats      Relevant Orders   Lipid panel   Hyperglycemia    Check A1c and glucose today (NON-fasting); try to lose five pounds      Relevant Orders   Hemoglobin A1c   Family history of diabetes mellitus in brother    Check A1c, try to lose five pounds       Other Visit Diagnoses    Medication monitoring encounter       Relevant Orders   COMPLETE METABOLIC PANEL WITH GFR   Screening for breast cancer       Relevant Orders   MM Digital Screening       Follow up plan: Return in about 6 months (around 11/03/2017) for follow-up visit with Dr. Sherie Don.  An after-visit summary was printed and given to the patient at check-out.  Please see the patient instructions which may contain other information and recommendations beyond what is mentioned above in the assessment and plan.  No orders of the defined types were placed in this encounter.   Orders Placed This Encounter  Procedures  . MM Digital Screening  . COMPLETE METABOLIC PANEL WITH GFR  . Hemoglobin A1c  . Lipid panel

## 2017-05-03 NOTE — Assessment & Plan Note (Signed)
Check lipids today; avoid saturated fats 

## 2017-05-03 NOTE — Assessment & Plan Note (Signed)
Check A1c and glucose today (NON-fasting); try to lose five pounds

## 2017-05-03 NOTE — Assessment & Plan Note (Signed)
Following with her cardiologist

## 2017-05-03 NOTE — Assessment & Plan Note (Signed)
Healing without complication; only use ONE NSAID

## 2017-05-03 NOTE — Assessment & Plan Note (Signed)
Limit salt

## 2017-05-03 NOTE — Assessment & Plan Note (Signed)
Check A1c today, try to lose five pounds

## 2017-05-03 NOTE — Assessment & Plan Note (Signed)
Monitored with rheumatologist; only take ONE NSAID; she opted for ALEVE only; get the rest of the house; okay to take tylenol per package directions

## 2017-05-04 ENCOUNTER — Telehealth: Payer: Self-pay

## 2017-05-04 LAB — LIPID PANEL
Cholesterol: 190 mg/dL (ref <200)
HDL: 61 mg/dL (ref >50)
LDL Cholesterol (Calc): 96 mg/dL (calc)
Non-HDL Cholesterol (Calc): 129 mg/dL (calc) (ref <130)
Total CHOL/HDL Ratio: 3.1 (calc) (ref <5.0)
Triglycerides: 239 mg/dL — ABNORMAL HIGH (ref <150)

## 2017-05-04 LAB — COMPLETE METABOLIC PANEL WITH GFR
AG Ratio: 1.5 (calc) (ref 1.0–2.5)
ALKALINE PHOSPHATASE (APISO): 94 U/L (ref 33–130)
ALT: 25 U/L (ref 6–29)
AST: 19 U/L (ref 10–35)
Albumin: 4 g/dL (ref 3.6–5.1)
BUN: 13 mg/dL (ref 7–25)
CALCIUM: 9.3 mg/dL (ref 8.6–10.4)
CO2: 29 mmol/L (ref 20–32)
CREATININE: 0.76 mg/dL (ref 0.50–1.05)
Chloride: 107 mmol/L (ref 98–110)
GFR, EST NON AFRICAN AMERICAN: 88 mL/min/{1.73_m2} (ref >=60)
GFR, Est African American: 102 mL/min/{1.73_m2} (ref >=60)
GLUCOSE: 82 mg/dL (ref 65–139)
Globulin: 2.7 g/dL (calc) (ref 1.9–3.7)
Potassium: 4 mmol/L (ref 3.5–5.3)
SODIUM: 140 mmol/L (ref 135–146)
Total Bilirubin: 0.5 mg/dL (ref 0.2–1.2)
Total Protein: 6.7 g/dL (ref 6.1–8.1)

## 2017-05-04 LAB — HEMOGLOBIN A1C
EAG (MMOL/L): 6.8 (calc)
HEMOGLOBIN A1C: 5.9 %{Hb} — AB (ref <5.7)
MEAN PLASMA GLUCOSE: 123 (calc)

## 2017-05-04 NOTE — Telephone Encounter (Signed)
-----   Message from Kerman PasseyMelinda P Lada, MD sent at 05/04/2017  8:09 AM EDT ----- Idamae Schullerkie, please let the patient know that her labs overall look pretty good; her 3 month blood sugar average is in the prediabetes range; her TG are high, so work on weight loss and cut back on fried foods, starchy foods like potatoes, and avoid sugary drinks (iced tea, sodas, fruit juices, etc.)

## 2017-05-04 NOTE — Telephone Encounter (Signed)
Called pt no answer. LM for pt informing her of lab results. CRM created. [facility]

## 2017-05-07 ENCOUNTER — Ambulatory Visit: Payer: Self-pay | Attending: Oncology

## 2017-05-08 ENCOUNTER — Telehealth: Payer: Self-pay | Admitting: Gastroenterology

## 2017-05-08 ENCOUNTER — Encounter: Payer: Self-pay | Admitting: Emergency Medicine

## 2017-05-08 ENCOUNTER — Telehealth: Payer: Self-pay | Admitting: Family Medicine

## 2017-05-08 NOTE — Telephone Encounter (Signed)
Copied from CRM [id number]. Topic: Quick Communication - See Telephone Encounter >> May 08, 2017  3:40 PM Arlyss Gandyichardson, Alyanna Stoermer N, NT wrote: CRM for notification. See Telephone encounter for: Pt would like a call with lab results.   05/08/17.

## 2017-05-08 NOTE — Telephone Encounter (Signed)
Patient needs to reschedule her procedure that is tomorrow. Please call

## 2017-05-08 NOTE — Telephone Encounter (Signed)
Called pt informed her of lab results.

## 2017-05-08 NOTE — Telephone Encounter (Signed)
Rescheduled Colonoscopy because patient did not start bowel prep or follow clear liquid diet.  She has been rescheduled to 05/15/17.

## 2017-05-14 ENCOUNTER — Telehealth: Payer: Self-pay | Admitting: Gastroenterology

## 2017-05-14 NOTE — Telephone Encounter (Signed)
LVM for patient to callback and reschedule procedure.   Returning patient's call.

## 2017-05-14 NOTE — Telephone Encounter (Signed)
Pt wants to reschedule colonoscopy for 05/15/17 please call pt

## 2017-05-15 ENCOUNTER — Ambulatory Visit: Admission: RE | Admit: 2017-05-15 | Payer: Self-pay | Source: Ambulatory Visit | Admitting: Gastroenterology

## 2017-05-15 ENCOUNTER — Ambulatory Visit
Admission: RE | Admit: 2017-05-15 | Discharge: 2017-05-15 | Disposition: A | Discharge status: Home / Self Care | Payer: Self-pay | Source: Ambulatory Visit | Attending: Gastroenterology | Admitting: Gastroenterology

## 2017-05-15 ENCOUNTER — Encounter
Admission: RE | Disposition: A | Discharge status: Home / Self Care | Payer: Self-pay | Source: Ambulatory Visit | Attending: Gastroenterology

## 2017-05-15 ENCOUNTER — Other Ambulatory Visit: Payer: Self-pay

## 2017-05-15 ENCOUNTER — Ambulatory Visit: Payer: Self-pay | Admitting: Certified Registered"

## 2017-05-15 ENCOUNTER — Encounter: Admission: RE | Payer: Self-pay | Source: Ambulatory Visit

## 2017-05-15 DIAGNOSIS — Z87891 Personal history of nicotine dependence: Secondary | ICD-10-CM | POA: Insufficient documentation

## 2017-05-15 DIAGNOSIS — Z1211 Encounter for screening for malignant neoplasm of colon: Secondary | ICD-10-CM | POA: Insufficient documentation

## 2017-05-15 DIAGNOSIS — I10 Essential (primary) hypertension: Secondary | ICD-10-CM | POA: Insufficient documentation

## 2017-05-15 HISTORY — PX: COLONOSCOPY WITH PROPOFOL: SHX5780

## 2017-05-15 SURGERY — COLONOSCOPY WITH PROPOFOL
Anesthesia: General

## 2017-05-15 MED ORDER — PROPOFOL 500 MG/50ML IV EMUL
INTRAVENOUS | Status: DC | PRN
  Administered 2017-05-15: 125 ug/kg/min via INTRAVENOUS

## 2017-05-15 MED ORDER — PROPOFOL 10 MG/ML IV BOLUS
INTRAVENOUS | Status: AC
  Filled 2017-05-15: qty 40

## 2017-05-15 MED ORDER — PROPOFOL 500 MG/50ML IV EMUL
INTRAVENOUS | Status: AC
  Filled 2017-05-15: qty 50

## 2017-05-15 MED ORDER — PROPOFOL 10 MG/ML IV BOLUS
INTRAVENOUS | Status: DC | PRN
  Administered 2017-05-15: 100 mg via INTRAVENOUS
  Administered 2017-05-15: 50 mg via INTRAVENOUS

## 2017-05-15 MED ORDER — SODIUM CHLORIDE 0.9 % IV SOLN
INTRAVENOUS | Status: DC
  Administered 2017-05-15: 10:00:00 via INTRAVENOUS

## 2017-05-15 NOTE — OR Nursing (Signed)
Pt was drinking water at 0900, Dr. Pernell DupreAdams notified, procedure delayed until 1100 am.

## 2017-05-15 NOTE — H&P (Signed)
Wyline MoodKiran Samreet Edenfield, MD [address], [address], [place], [place], [address] [address], [address], [place], [place], [address] Phone: [phone]  Fax: [phone]  Primary Care Physician:  Kerman PasseyLada, Melinda P, MD   Pre-Procedure History & Physical: HPI:  Collene MaresMary A Barnette is a 57 y.o. female is here for an colonoscopy.   Past Medical History:  Diagnosis Date  . Arthritis pain   . Bilateral leg pain   . Chronic nasal congestion   . Dizziness of unknown cause   . Heart murmur previously undiagnosed   . Osteoarthritis, hand     Past Surgical History:  Procedure Laterality Date  . HAND RECONSTRUCTION Bilateral    as a child    Prior to Admission medications   Medication Sig Start Date End Date Taking? Authorizing Provider  acetaminophen (TYLENOL) 325 MG tablet Take 1-2 tablets (325-650 mg total) by mouth every 6 (six) hours as needed. Maximum dose is 3,000 mg per 24 hours 05/03/17  Yes Lada, Janit BernMelinda P, MD  Cholecalciferol (VITAMIN D3) 2000 UNITS capsule Take by mouth.   Yes [provider]  naproxen sodium (ALEVE) 220 MG tablet Take 1-2 tablets (220-440 mg total) by mouth 2 (two) times daily as needed. 05/03/17  Yes Lada, Janit BernMelinda P, MD  nystatin cream (MYCOSTATIN) Apply 1 application topically 2 (two) times daily. Patient not taking: Reported on 01/17/2017 08/10/16   Doren CustardBoyce, Emily E, FNP    Allergies as of 05/14/2017 - Review Complete 05/14/2017  Allergen Reaction Noted  . Tramadol Rash 10/05/2014    Family History  Problem Relation Age of Onset  . Diabetes Brother   . Heart attack Mother   . Breast cancer Neg Hx   . Ovarian cancer Neg Hx   . Colon cancer Neg Hx   . Heart disease Neg Hx     Social History   Socioeconomic History  . Marital status: Single    Spouse name: Not on file  . Number of children: Not on file  . Years of education: Not on file  . Highest education level: Not on file  Occupational History  . Not on file  Social Needs  . Financial  resource strain: Not on file  . Food insecurity:    Worry: Not on file    Inability: Not on file  . Transportation needs:    Medical: Not on file    Non-medical: Not on file  Tobacco Use  . Smoking status: Former Smoker    Last attempt to quit: 1990    Years since quitting: 29.2  . Smokeless tobacco: Never Used  Substance and Sexual Activity  . Alcohol use: No  . Drug use: No  . Sexual activity: Not Currently    Birth control/protection: Post-menopausal  Lifestyle  . Physical activity:    Days per week: Not on file    Minutes per session: Not on file  . Stress: Not on file  Relationships  . Social connections:    Talks on phone: Not on file    Gets together: Not on file    Attends religious service: Not on file    Active member of club or organization: Not on file    Attends meetings of clubs or organizations: Not on file    Relationship status: Not on file  . Intimate partner violence:    Fear of current or ex partner: Not on file    Emotionally abused: Not on file    Physically abused: Not on  file    Forced sexual activity: Not on file  Other Topics Concern  . Not on file  Social History Narrative   Widowed   Works full time   Carbonated beverages    Review of Systems: See HPI, otherwise negative ROS  Physical Exam: BP 122/79   Pulse 69   Temp (!) 96.8 F (36 C)   Resp 16   Ht 5\' 4"  (1.626 m)   Wt 161 lb (73 kg)   SpO2 98%   BMI 27.64 kg/m  General:   Alert,  pleasant and cooperative in NAD Head:  Normocephalic and atraumatic. Neck:  Supple; no masses or thyromegaly. Lungs:  Clear throughout to auscultation, normal respiratory effort.    Heart:  +S1, +S2, Regular rate and rhythm, No edema. Abdomen:  Soft, nontender and nondistended. Normal bowel sounds, without guarding, and without rebound.   Neurologic:  Alert and  oriented x4;  grossly normal neurologically.  Impression/Plan: [name] is here for an colonoscopy to be performed for  Screening colonoscopy average risk   Risks, benefits, limitations, and alternatives regarding  colonoscopy have been reviewed with the patient.  Questions have been answered.  All parties agreeable.   Wyline Mood, MD  05/15/2017, 9:41 AM

## 2017-05-15 NOTE — Op Note (Signed)
[facility] Patient Name: Brenda Ellison Procedure Date: 05/15/2017 9:47 AM MRN: [id number] Account #: [id number] Date of Birth: [birth year] Admit Type: Outpatient Age: 57 Room: [facility] ENDO ROOM 1 Gender: Female Note Status: Finalized Procedure:            Colonoscopy Indications:          Screening for colorectal malignant neoplasm Providers:            Wyline Mood MD, MD Referring MD:         Kerman Passey (Referring MD) Medicines:            Monitored Anesthesia Care Complications:        No immediate complications. Procedure:            Pre-Anesthesia Assessment:                       - Prior to the procedure, a History and Physical was                        performed, and patient medications, allergies and                        sensitivities were reviewed. The patient's tolerance of                        previous anesthesia was reviewed.                       - The risks and benefits of the procedure and the                        sedation options and risks were discussed with the                        patient. All questions were answered and informed                        consent was obtained.                       - After reviewing the risks and benefits, the patient                        was deemed in satisfactory condition to undergo the                        procedure.                       - ASA Grade Assessment: II - A patient with mild                        systemic disease.                       After obtaining informed consent, the colonoscope was                        passed under direct vision. Throughout the procedure,                        the  patient's blood pressure, pulse, and oxygen                        saturations were monitored continuously. The                        Colonoscope was introduced through the anus and                        advanced to the the cecum, identified by the   appendiceal orifice, IC valve and transillumination.                        The colonoscopy was performed with ease. The patient                        tolerated the procedure well. The quality of the bowel                        preparation was good. Findings:      The perianal and digital rectal examinations were normal.      The entire examined colon appeared normal. Impression:           - The entire examined colon is normal.                       - No specimens collected. Recommendation:       - Discharge patient to home (with escort).                       - Resume previous diet.                       - Continue present medications.                       - Repeat colonoscopy in 10 years for screening purposes. Procedure Code(s):    --- Professional ---                       [id number], Colonoscopy, flexible; diagnostic, including                        collection of specimen(s) by brushing or washing, when                        performed (separate procedure) Diagnosis Code(s):    --- Professional ---                       Z12.11, Encounter for screening for malignant neoplasm                        of colon CPT copyright 2016 American Medical Association. All rights reserved. The codes documented in this report are preliminary and upon coder review may  be revised to meet current compliance requirements. Wyline MoodKiran Jaquail Mclees, MD Wyline MoodKiran Rayfield Beem MD, MD 05/15/2017 12:11:30 PM This report has been signed electronically. Number of Addenda: 0 Note Initiated On: 05/15/2017 9:47 AM Scope Withdrawal Time: 0 hours 6 minutes 10 seconds  Total Procedure Duration: 0 hours 8 minutes 44 seconds       [facility]

## 2017-05-15 NOTE — Transfer of Care (Signed)
Immediate Anesthesia Transfer of Care Note  Patient: Brenda Ellison  Procedure(s) Performed: COLONOSCOPY WITH PROPOFOL (N/A )  Patient Location: PACU  Anesthesia Type:General  Level of Consciousness: awake  Airway & Oxygen Therapy: Patient Spontanous Breathing and Patient connected to nasal cannula oxygen  Post-op Assessment: Report given to RN and Post -op Vital signs reviewed and stable  Post vital signs: Reviewed and stable  Last Vitals:  Vitals Value Taken Time  BP    Temp    Pulse 62 05/15/2017 12:14 PM  Resp 8 05/15/2017 12:14 PM  SpO2 100 % 05/15/2017 12:14 PM  Vitals shown include unvalidated device data.  Last Pain: There were no vitals filed for this visit.       Complications: No apparent anesthesia complications

## 2017-05-15 NOTE — Anesthesia Preprocedure Evaluation (Signed)
Anesthesia Evaluation  Patient identified by MRN, date of birth, ID band Patient awake    Reviewed: Allergy & Precautions, H&P , NPO status , Patient's Chart, lab work & pertinent test results, reviewed documented beta blocker date and time   Airway Mallampati: II   Neck ROM: full    Dental  (+) Poor Dentition, Teeth Intact   Pulmonary neg pulmonary ROS, shortness of breath, former smoker,    Pulmonary exam normal        Cardiovascular Exercise Tolerance: Poor hypertension, On Medications negative cardio ROS Normal cardiovascular exam+ Valvular Problems/Murmurs  Rhythm:regular Rate:Normal     Neuro/Psych  Neuromuscular disease negative neurological ROS  negative psych ROS   GI/Hepatic negative GI ROS, Neg liver ROS,   Endo/Other  negative endocrine ROS  Renal/GU negative Renal ROS  negative genitourinary   Musculoskeletal   Abdominal   Peds  Hematology negative hematology ROS (+)   Anesthesia Other Findings Past Medical History: No date: Arthritis pain No date: Bilateral leg pain No date: Chronic nasal congestion No date: Dizziness of unknown cause No date: Heart murmur previously undiagnosed No date: Osteoarthritis, hand Past Surgical History: No date: HAND RECONSTRUCTION; Bilateral     Comment:  as a child BMI    Body Mass Index:  27.64 kg/m     Reproductive/Obstetrics negative OB ROS                             Anesthesia Physical Anesthesia Plan  ASA: III  Anesthesia Plan: General   Post-op Pain Management:    Induction:   PONV Risk Score and Plan:   Airway Management Planned:   Additional Equipment:   Intra-op Plan:   Post-operative Plan:   Informed Consent: I have reviewed the patients History and Physical, chart, labs and discussed the procedure including the risks, benefits and alternatives for the proposed anesthesia with the patient or authorized  representative who has indicated his/her understanding and acceptance.   Dental Advisory Given  Plan Discussed with: CRNA  Anesthesia Plan Comments:         Anesthesia Quick Evaluation

## 2017-05-15 NOTE — Anesthesia Post-op Follow-up Note (Signed)
Anesthesia QCDR form completed.        

## 2017-05-16 ENCOUNTER — Encounter: Payer: Self-pay | Admitting: Gastroenterology

## 2017-05-17 NOTE — Anesthesia Postprocedure Evaluation (Signed)
Anesthesia Post Note  Patient: Sadaf A Barnette  Procedure(s) Performed: COLONOSCOPY WITH PROPOFOL (N/A )  Patient location during evaluation: PACU Anesthesia Type: General Level of consciousness: awake and alert Pain management: pain level controlled Vital Signs Assessment: post-procedure vital signs reviewed and stable Respiratory status: spontaneous breathing, nonlabored ventilation, respiratory function stable and patient connected to nasal cannula oxygen Cardiovascular status: blood pressure returned to baseline and stable Postop Assessment: no apparent nausea or vomiting Anesthetic complications: no     Last Vitals:  Vitals:   05/15/17 1224 05/15/17 1234  BP: 124/81 130/77  Pulse: 61 63  Resp: (!) 24 14  Temp:    SpO2: 100% 100%    Last Pain:  Vitals:   05/15/17 1214  TempSrc: Tympanic                 Yevette EdwardsJames G Adams

## 2017-06-01 ENCOUNTER — Ambulatory Visit
Admission: RE | Admit: 2017-06-01 | Discharge: 2017-06-01 | Disposition: A | Discharge status: Home / Self Care | Payer: Medicaid Other | Source: Ambulatory Visit | Attending: Family Medicine | Admitting: Family Medicine

## 2017-06-01 DIAGNOSIS — Z1239 Encounter for other screening for malignant neoplasm of breast: Secondary | ICD-10-CM

## 2017-06-01 DIAGNOSIS — Z1231 Encounter for screening mammogram for malignant neoplasm of breast: Secondary | ICD-10-CM | POA: Insufficient documentation

## 2017-06-13 ENCOUNTER — Ambulatory Visit: Payer: Self-pay | Admitting: *Deleted

## 2017-06-13 NOTE — Telephone Encounter (Signed)
Pt called because she has had problems with keeping her jobs. She has had several at a time. She lost her job last month but now she has a job interview and is going to work at another job. She was feeling stressed from not having a job and it made her a little tired and some lightheadedness. She denies fever, nausea or vomiting, difficulty breathing, chest pain or palpitations. She said that she just took a nap and that calmed her down. Home care advice given to her with verbal understanding. Per protocol, home care advised. Pt advised to call back for any increase in her anxiety, fever or just feeling worst. Pt voiced understand.  Reason for Disposition . Normal anxiety  Answer Assessment - Initial Assessment Questions 1. CONCERN: "What happened that made you call today?"     Waiting on job interviews 2. ANXIETY SYMPTOM SCREENING: "Can you describe how you have been feeling?"  (e.g., tense, restless, panicky, anxious, keyed up, trouble sleeping, trouble concentrating)     Tensed, anxious 3. ONSET: "How long have you been feeling this way?"     Not sure 4. RECURRENT: "Have you felt this way before?"  If yes: "What happened that time?" "What helped these feelings go away in the past?"      Yes before and just took it easy 5. RISK OF HARM - SUICIDAL IDEATION:  "Do you ever have thoughts of hurting or killing yourself?"  (e.g., yes, no, no but preoccupation with thoughts about death)   - INTENT:  "Do you have thoughts of hurting or killing yourself right NOW?" (e.g., yes, no, N/A)   - PLAN: "Do you have a specific plan for how you would do this?" (e.g., gun, knife, overdose, no plan, N/A)     no 6. RISK OF HARM - HOMICIDAL IDEATION:  "Do you ever have thoughts of hurting or killing someone else?"  (e.g., yes, no, no but preoccupation with thoughts about death)   - INTENT:  "Do you have thoughts of hurting or killing someone right NOW?" (e.g., yes, no, N/A)   - PLAN: "Do you have a specific plan  for how you would do this?" (e.g., gun, knife, no plan, N/A)      no 7. FUNCTIONAL IMPAIRMENT: "How have things been going for you overall in your life? Have you had any more difficulties than usual doing your normal daily activities?"  (e.g., better, same, worse; self-care, school, work, interactions)     Going better 8. SUPPORT: "Who is with you now?" "Who do you live with?" "Do you have family or friends nearby who you can talk to?"      Husband and can talk to him 9. THERAPIST: "Do you have a counselor or therapist? Name?"     no 10. STRESSORS: "Has there been any new stress or recent changes in your life?"       Job situation 11. CAFFEINE ABUSE: "Do you drink caffeinated beverages, and how much each day?" (e.g., coffee, tea, colas)       no 12. SUBSTANCE ABUSE: "Do you use any illegal drugs or alcohol?"       no 13. OTHER SYMPTOMS: "Do you have any other physical symptoms right now?" (e.g., chest pain, palpitations, difficulty breathing, fever)       no 14. PREGNANCY: "Is there any chance you are pregnant?" "When was your last menstrual period?"       No periods  Protocols used: ANXIETY AND PANIC ATTACK-A-AH

## 2017-07-13 ENCOUNTER — Encounter: Payer: Self-pay | Admitting: Emergency Medicine

## 2017-07-13 ENCOUNTER — Other Ambulatory Visit: Payer: Self-pay

## 2017-07-13 ENCOUNTER — Emergency Department
Admission: EM | Admit: 2017-07-13 | Discharge: 2017-07-13 | Disposition: A | Discharge status: Home / Self Care | Payer: Medicaid Other | Attending: Emergency Medicine | Admitting: Emergency Medicine

## 2017-07-13 DIAGNOSIS — Z79899 Other long term (current) drug therapy: Secondary | ICD-10-CM | POA: Insufficient documentation

## 2017-07-13 DIAGNOSIS — G8929 Other chronic pain: Secondary | ICD-10-CM

## 2017-07-13 DIAGNOSIS — M545 Low back pain: Secondary | ICD-10-CM | POA: Insufficient documentation

## 2017-07-13 DIAGNOSIS — Z87891 Personal history of nicotine dependence: Secondary | ICD-10-CM | POA: Insufficient documentation

## 2017-07-13 MED ORDER — MELOXICAM 15 MG PO TABS: 15.0000 mg | ORAL_TABLET | Freq: Every day | ORAL | 2 refills | Status: DC

## 2017-07-13 MED ORDER — BACLOFEN 10 MG PO TABS: 10.0000 mg | ORAL_TABLET | Freq: Every day | ORAL | 1 refills | Status: DC

## 2017-07-13 NOTE — ED Triage Notes (Signed)
Pt presents to ED via POV c/o lower back pain radiating into bil buttocks. Hx lumbar stenosis, has seen orthopedic surgeon at [facility] and was told she may eventually need surgery. 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 Has been taking aleve with some relief.

## 2017-07-13 NOTE — ED Provider Notes (Signed)
[facility] Emergency Department Provider Note  ____________________________________________   First MD Initiated Contact with Patient 07/13/17 1523     (approximate)  I have reviewed the triage vital signs and the nursing notes.   HISTORY  Chief Complaint Back Pain    HPI Brenda Ellison is a 57 y.o. female presents emergency department complaining of low back pain that radiates into both buttocks.  She states she has a history of back pain.  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  She denies any new injury.  She states this all has been going on for a while and has a history of arthritis.  She denies any difficulty with the bowel or the bladder.  Past Medical History:  Diagnosis Date  . Arthritis pain   . Bilateral leg pain   . Chronic nasal congestion   . Dizziness of unknown cause   . Heart murmur previously undiagnosed   . Osteoarthritis, hand     Patient Active Problem List   Diagnosis Date Noted  . 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 Arthralgia of hand 03/16/2017  . Cardiac murmur 03/16/2017  . Screening for gout 03/16/2017  . Lumbar radiculopathy 03/16/2017  . S/P tooth extraction 12/19/2016  . Prediabetes 07/12/2016  . High foot arch 04/26/2016  . Chest pain 03/22/2016  . Upper back pain on left side 03/22/2016  . Well woman exam without gynecological exam 11/09/2015  . 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 Family history of diabetes mellitus in brother 11/05/2015  . Bilateral hand pain 06/15/2015  . Osteoarthritis 06/07/2015  . Mixed hyperlipidemia 06/01/2015  . Shortness of breath 06/01/2015  . Hyperglycemia 05/07/2015  . Multiple allergies 01/12/2015  . 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 Annual physical exam 10/19/2014  . 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 Degenerative arthritis of hip 10/19/2014  . Allergic dermatitis 10/05/2014  . Nerve root pain 10/05/2014  .  Arthritis of hand, degenerative 10/05/2014  . Benign essential HTN 05/01/2014  . 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 Moderate mitral insufficiency 05/01/2014    Past Surgical History:  Procedure Laterality Date  . COLONOSCOPY WITH PROPOFOL N/A 05/15/2017   Procedure: COLONOSCOPY WITH PROPOFOL;  Surgeon: Wyline Mood, MD;  Location: [facility] ENDOSCOPY;  Service: Gastroenterology;  Laterality: N/A;  . HAND RECONSTRUCTION Bilateral    as a child    Prior to Admission medications   Medication Sig Start Date End Date Taking? Authorizing Provider  acetaminophen (TYLENOL) 325 MG tablet Take 1-2 tablets (325-650 mg total) by mouth every 6 (six) hours as needed. Maximum dose is 3,000 mg per 24 hours 05/03/17   Kerman Passey, MD  baclofen (LIORESAL) 10 MG tablet Take 1 tablet (10 mg total) by mouth daily. 07/13/17 07/13/18  Sherrie Mustache Roselyn Bering, PA-C  Cholecalciferol (VITAMIN D3) 2000 UNITS capsule Take by mouth.    [provider]  meloxicam (MOBIC) 15 MG tablet Take 1 tablet (15 mg total) by mouth daily. 07/13/17 07/13/18  Fisher, Roselyn Bering, PA-C  naproxen sodium (ALEVE) 220 MG tablet Take 1-2 tablets (220-440 mg total) by mouth 2 (two) times daily as needed. 05/03/17   Kerman Passey, MD  nystatin cream (MYCOSTATIN) Apply 1 application topically 2 (two) times daily. Patient not taking: Reported on 01/17/2017 08/10/16   Doren Custard, FNP    Allergies Tramadol  Family History  Problem Relation Age of Onset  . Diabetes Brother   .  Heart attack Mother   . Breast cancer Neg Hx   . Ovarian cancer Neg Hx   . Colon cancer Neg Hx   . Heart disease Neg Hx     Social History Social History   Tobacco Use  . Smoking status: Former Smoker    Last attempt to quit: 1990    Years since quitting: 29.4  . Smokeless tobacco: Never Used  Substance Use Topics  . Alcohol use: No  . Drug use: No    Review of Systems  Constitutional: No fever/chills Eyes: No visual changes. ENT: No sore  throat. Respiratory: Denies cough Genitourinary: Negative for dysuria. Musculoskeletal: Positive for back pain. Skin: Negative for rash.    ____________________________________________   PHYSICAL EXAM:  VITAL SIGNS: ED Triage Vitals  Enc Vitals Group     BP 07/13/17 1512 127/78     Pulse Rate 07/13/17 1512 67     Resp 07/13/17 1512 14     Temp 07/13/17 1512 98.1 F (36.7 C)     Temp Source 07/13/17 1512 Oral     SpO2 07/13/17 1512 99 %     Weight 07/13/17 1512 167 lb (75.8 kg)     Height 07/13/17 1512  (1.626 m)     Head Circumference --      Peak Flow --      Pain Score 07/13/17 1516 10     Pain Loc --      Pain Edu? --      Excl. in GC? --     Constitutional: Alert and oriented. Well appearing and in no acute distress. Eyes: Conjunctivae are normal.  Head: Atraumatic. Nose: No congestion/rhinnorhea. Mouth/Throat: Mucous membranes are moist.   Cardiovascular: Normal rate, regular rhythm. Respiratory: Normal respiratory effort.  No retractions GU: deferred Musculoskeletal: FROM all extremities, warm and well perfused.  The lumbar spine is mildly tender in the paravertebral muscles, there is some spasming and tightness noted through the lumbar muscles into the gluteal muscles.  The patient does have full range of motion and can walk easily without foot drop. Neurologic:  Normal speech and language.  Skin:  Skin is warm, dry and intact. No rash noted. Psychiatric: Mood and affect are normal. Speech and behavior are normal.  ____________________________________________   LABS (all labs ordered are listed, but only abnormal results are displayed)  Labs Reviewed - No data to display ____________________________________________   ____________________________________________  RADIOLOGY    ____________________________________________   PROCEDURES  Procedure(s) performed: No  Procedures    ____________________________________________   INITIAL  IMPRESSION / ASSESSMENT AND PLAN / ED COURSE  Pertinent labs & imaging results that were available during my care of the patient were reviewed by me and considered in my medical decision making (see chart for details).  Patient is 57 year old female presents emergency department complaining of chronic low back pain.  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  On physical exam the patient has some muscle tightness and inflammation of the lower spine.  Paravertebral muscles are tender.  Patient is able to walk without difficulty.  There is no foot drop noted.  She is neurovascularly intact.  Splane the exam findings to the patient.  She was given a prescription for meloxicam 15 mg/day and baclofen 10 mg 3 times a day.  She is to apply ice to the lower back.  She is follow-up with her regular doctor if not better 5 7 days.  She states she  understands comply with instructions.  She was discharged in stable condition.     As part of my medical decision making, I reviewed the following data within the electronic MEDICAL RECORD NUMBER Nursing notes reviewed and incorporated, Old chart reviewed, Notes from prior ED visits and [place] Controlled Substance Database  ____________________________________________   FINAL CLINICAL IMPRESSION(S) / ED DIAGNOSES  Final diagnoses:  Chronic midline low back pain without sciatica      NEW MEDICATIONS STARTED DURING THIS VISIT:  Discharge Medication List as of 07/13/2017  3:32 PM    START taking these medications   Details  baclofen (LIORESAL) 10 MG tablet Take 1 tablet (10 mg total) by mouth daily., Starting Fri 07/13/2017, Until Sat 07/13/2018, Print    meloxicam (MOBIC) 15 MG tablet Take 1 tablet (15 mg total) by mouth daily., Starting Fri 07/13/2017, Until Sat 07/13/2018, Print         Note:  This document was prepared using Dragon voice recognition software and may include unintentional dictation errors.    Faythe Ghee,  PA-C 07/13/17 1608    Emily Filbert, MD 07/14/17 [phone]

## 2017-07-13 NOTE — Discharge Instructions (Addendum)
Follow-up with your regular doctor if not better in 5 to 7 days.  Use medication as prescribed.  Apply ice to the lower back.

## 2017-11-02 ENCOUNTER — Ambulatory Visit: Payer: Self-pay | Admitting: Family Medicine

## 2017-11-02 ENCOUNTER — Ambulatory Visit: Payer: Self-pay | Admitting: Nurse Practitioner

## 2017-11-06 ENCOUNTER — Encounter: Payer: Self-pay | Admitting: Family Medicine

## 2017-11-06 ENCOUNTER — Ambulatory Visit (INDEPENDENT_AMBULATORY_CARE_PROVIDER_SITE_OTHER): Payer: Self-pay | Admitting: Family Medicine

## 2017-11-06 VITALS — BP 128/78 | HR 74 | Temp 98.6°F | Ht 63.0 in | Wt 161.7 lb

## 2017-11-06 DIAGNOSIS — I10 Essential (primary) hypertension: Secondary | ICD-10-CM

## 2017-11-06 DIAGNOSIS — M4726 Other spondylosis with radiculopathy, lumbar region: Secondary | ICD-10-CM

## 2017-11-06 DIAGNOSIS — M7918 Myalgia, other site: Secondary | ICD-10-CM

## 2017-11-06 DIAGNOSIS — Z5181 Encounter for therapeutic drug level monitoring: Secondary | ICD-10-CM

## 2017-11-06 DIAGNOSIS — M19042 Primary osteoarthritis, left hand: Secondary | ICD-10-CM

## 2017-11-06 DIAGNOSIS — R300 Dysuria: Secondary | ICD-10-CM

## 2017-11-06 DIAGNOSIS — M19041 Primary osteoarthritis, right hand: Secondary | ICD-10-CM

## 2017-11-06 DIAGNOSIS — I340 Nonrheumatic mitral (valve) insufficiency: Secondary | ICD-10-CM

## 2017-11-06 DIAGNOSIS — M5136 Other intervertebral disc degeneration, lumbar region: Secondary | ICD-10-CM

## 2017-11-06 DIAGNOSIS — R7303 Prediabetes: Secondary | ICD-10-CM

## 2017-11-06 DIAGNOSIS — E782 Mixed hyperlipidemia: Secondary | ICD-10-CM

## 2017-11-06 DIAGNOSIS — M5416 Radiculopathy, lumbar region: Secondary | ICD-10-CM

## 2017-11-06 DIAGNOSIS — M51369 Other intervertebral disc degeneration, lumbar region without mention of lumbar back pain or lower extremity pain: Secondary | ICD-10-CM | POA: Insufficient documentation

## 2017-11-06 DIAGNOSIS — Z23 Encounter for immunization: Secondary | ICD-10-CM

## 2017-11-06 MED ORDER — MELOXICAM 15 MG PO TABS: 15.0000 mg | ORAL_TABLET | Freq: Every day | ORAL | 0 refills | Status: DC | PRN

## 2017-11-06 NOTE — Assessment & Plan Note (Addendum)
Concerning symptoms for spinal stenosis; will refer to PT and spine specialist

## 2017-11-06 NOTE — Assessment & Plan Note (Signed)
Patient wishes for referral back to her spine specialist

## 2017-11-06 NOTE — Assessment & Plan Note (Addendum)
Consider order MRI because of concern over possible spinal stenosis; refer to PT; patient opted for referral back to spine specialist

## 2017-11-06 NOTE — Assessment & Plan Note (Signed)
Controlling with diet; no medicines; try DASH guidelines

## 2017-11-06 NOTE — Assessment & Plan Note (Signed)
Stop OTC NSAIDs and start meloxicam; if not improving with Rx, then refer to rheum or ortho

## 2017-11-06 NOTE — Patient Instructions (Addendum)
Start the meloxicam once a day for pain If you need something else for aches or pains, try to use Tylenol (acetaminophen) instead of non-steroidals (which include Aleve, ibuprofen, Advil, Motrin, and naproxen); non-steroidals can cause long-term kidney damage  Try to follow the DASH guidelines (DASH stands for Dietary Approaches to Stop Hypertension). Try to limit the sodium in your diet to no more than 1,500mg  of sodium per day. Certainly try to not exceed 2,000 mg per day at the very most. Do not add salt when cooking or at the table.  Check the sodium amount on labels when shopping, and choose items lower in sodium when given a choice. Avoid or limit foods that already contain a lot of sodium. Eat a diet rich in fruits and vegetables and whole grains, and try to lose weight if overweight or obese  Try to limit saturated fats in your diet (bologna, hot dogs, barbeque, cheeseburgers, hamburgers, steak, bacon, sausage, cheese, etc.) and get more fresh fruits, vegetables, and whole grains  Lose five pounds over the next 2-3 months

## 2017-11-06 NOTE — Assessment & Plan Note (Signed)
Seeing cardiologist 

## 2017-11-06 NOTE — Assessment & Plan Note (Signed)
Just saw rheumatologist a few months ago; will use meloxicam now instead of Aleve since the Aleve is not helping

## 2017-11-06 NOTE — Progress Notes (Signed)
BP 128/78   Pulse 74   Temp 98.6 F (37 C)   Ht 5\' 3"  (1.6 m)   Wt 161 lb 11.2 oz (73.3 kg)   SpO2 99%   BMI 28.64 kg/m    Subjective:    Patient ID: Brenda Ellison, female    DOB: 12-14-1960, 57 y.o.   MRN: [id number]  HPI: Brenda Ellison is a 57 y.o. female  Chief Complaint  Patient presents with  . Follow-up    HPI Patient is here for follow-up  She has prediabetes; trying to avoid sugary drinks; drinks OJ less often; occasional blurred vision; some dry mouth Lab Results  Component Value Date   HGBA1C 5.9 (H) 05/03/2017   Since last visit, she has back pain; stands all day; wearing good tennis shoes; she does not think she has good posture; she hurts in her lower back and down in the buttocks; back of her legs and butt; walks hunched over, can't straighten up; legs hurt in the buttocks and back of the thigh; I asked about B/B dysfunction; she can't make it to the bathroom when water is running; has to squeeze down there; has had some burning with urination; no urine odor; aleve plus tylenol does not help; she is in pain all day every day; she reported 10/10 pain to check-in staff; current pain level is an 8 out of 10 she reports  Chart reviewed; she was referred to pain clinic in 2018; used to see Dr. Casimiro Needle, back specialist and wants to go back, [place]  Last xrays of the back were August 2018: multilevel DDD throughout the spine, 4 mm anterolistehsis at L3-L4  High cholesterol; tries to avoid fried foods  Heart murmur; saw cardiologist, Dr. Gwen Pounds, no chest pain    Depression screen [facility] 2/9 11/06/2017 05/03/2017 01/17/2017 12/19/2016 11/15/2016  Decreased Interest 0 0 0 0 0  Down, Depressed, Hopeless 0 0 0 0 0  PHQ - 2 Score 0 0 0 0 0    Relevant past medical, surgical, family and social history reviewed Past Medical History:  Diagnosis Date  . Arthritis pain   . Bilateral leg pain   . Chronic nasal congestion   . Dizziness of unknown cause   . Heart  murmur previously undiagnosed   . Osteoarthritis, hand    Past Surgical History:  Procedure Laterality Date  . COLONOSCOPY WITH PROPOFOL N/A 05/15/2017   Procedure: COLONOSCOPY WITH PROPOFOL;  Surgeon: Wyline Mood, MD;  Location: [facility] ENDOSCOPY;  Service: Gastroenterology;  Laterality: N/A;  . HAND RECONSTRUCTION Bilateral    as a child   Family History  Problem Relation Age of Onset  . Diabetes Brother   . Heart attack Mother   . Breast cancer Neg Hx   . Ovarian cancer Neg Hx   . Colon cancer Neg Hx   . Heart disease Neg Hx    Social History   Tobacco Use  . Smoking status: Former Smoker    Last attempt to quit: 1990    Years since quitting: 29.7  . Smokeless tobacco: Never Used  Substance Use Topics  . Alcohol use: No  . Drug use: No    Interim medical history since last visit reviewed. Allergies and medications reviewed  Review of Systems Per HPI unless specifically indicated above     Objective:    BP 128/78   Pulse 74   Temp 98.6 F (37 C)   Ht 5\' 3"  (1.6 m)   Wt 161 lb  11.2 oz (73.3 kg)   SpO2 99%   BMI 28.64 kg/m   Wt Readings from Last 3 Encounters:  11/06/17 161 lb 11.2 oz (73.3 kg)  07/13/17 167 lb (75.8 kg)  05/15/17 161 lb (73 kg)    Physical Exam  Constitutional: She appears well-developed and well-nourished. No distress.  HENT:  Head: Normocephalic and atraumatic.  Eyes: EOM are normal. No scleral icterus.  Neck: No thyromegaly present.  Cardiovascular: Normal rate, regular rhythm and normal heart sounds.  No murmur heard. Pulmonary/Chest: Effort normal and breath sounds normal. No respiratory distress. She has no wheezes.  Abdominal: Soft. Bowel sounds are normal. She exhibits no distension.  Musculoskeletal: She exhibits no edema.       Right hand: She exhibits deformity (significant heberden's and bouchards nodes).       Left hand: She exhibits deformity (significant heberden's and bouchards nodes).  Loss of lumbar lordosis    Neurological: She is alert.  SLR negative B; normal gait  Skin: Skin is warm and dry. She is not diaphoretic. No pallor.  Psychiatric: She has a normal mood and affect. Her mood appears not anxious. She does not exhibit a depressed mood.    Results for orders placed or performed in visit on 05/03/17  COMPLETE METABOLIC PANEL WITH GFR  Result Value Ref Range   Glucose, Bld 82 65 - 139 mg/dL   BUN 13 7 - 25 mg/dL   Creat 7.560.76 4.330.50 - 2.951.05 mg/dL   GFR, Est Non African American 88 > OR = 60 mL/min/1.873m2   GFR, Est African American 102 > OR = 60 mL/min/1.3773m2   BUN/Creatinine Ratio NOT APPLICABLE 6 - 22 (calc)   Sodium 140 135 - 146 mmol/L   Potassium 4.0 3.5 - 5.3 mmol/L   Chloride 107 98 - 110 mmol/L   CO2 29 20 - 32 mmol/L   Calcium 9.3 8.6 - 10.4 mg/dL   Total Protein 6.7 6.1 - 8.1 g/dL   Albumin 4.0 3.6 - 5.1 g/dL   Globulin 2.7 1.9 - 3.7 g/dL (calc)   AG Ratio 1.5 1.0 - 2.5 (calc)   Total Bilirubin 0.5 0.2 - 1.2 mg/dL   Alkaline phosphatase (APISO) 94 33 - 130 U/L   AST 19 10 - 35 U/L   ALT 25 6 - 29 U/L  Hemoglobin A1c  Result Value Ref Range   Hgb A1c MFr Bld 5.9 (H) <5.7 % of total Hgb   Mean Plasma Glucose 123 (calc)   eAG (mmol/L) 6.8 (calc)  Lipid panel  Result Value Ref Range   Cholesterol 190 <200 mg/dL   HDL 61 >18>50 mg/dL   Triglycerides 841239 (H) <150 mg/dL   LDL Cholesterol (Calc) 96 mg/dL (calc)   Total CHOL/HDL Ratio 3.1 <5.0 (calc)   Non-HDL Cholesterol (Calc) 129 <130 mg/dL (calc)      Assessment & Plan:   Problem List Items Addressed This Visit      Cardiovascular and Mediastinum   Moderate mitral insufficiency    Seeing cardiologist      Benign essential HTN    Controlling with diet; no medicines; try DASH guidelines        Nervous and Auditory   Lumbar radiculopathy    Concerning symptoms for spinal stenosis; will refer to PT and spine specialist      Relevant Orders   Ambulatory referral to Physical Therapy   Ambulatory referral to  Orthopedic Surgery     Musculoskeletal and Integument   Osteoarthritis - Primary  Stop OTC NSAIDs and start meloxicam; if not improving with Rx, then refer to rheum or ortho      Relevant Medications   meloxicam (MOBIC) 15 MG tablet   Other Relevant Orders   Ambulatory referral to Orthopedic Surgery   Degenerative arthritis of lumbar spine    Patient wishes for referral back to her spine specialist      Relevant Medications   meloxicam (MOBIC) 15 MG tablet   Other Relevant Orders   Ambulatory referral to Orthopedic Surgery   DDD (degenerative disc disease), lumbar    Consider order MRI because of concern over possible spinal stenosis; refer to PT; patient opted for referral back to spine specialist      Relevant Medications   meloxicam (MOBIC) 15 MG tablet   Other Relevant Orders   Ambulatory referral to Orthopedic Surgery     Other   Prediabetes   Relevant Orders   Hemoglobin A1c   Mixed hyperlipidemia   Relevant Orders   Lipid panel    Other Visit Diagnoses    Bilateral buttock pain       Relevant Orders   Ambulatory referral to Orthopedic Surgery   Need for influenza vaccination       Relevant Orders   Flu Vaccine QUAD 6+ mos PF IM (Fluarix Quad PF) (Completed)   Dysuria       Relevant Orders   Urinalysis w microscopic + reflex cultur   Medication monitoring encounter       Relevant Orders   COMPLETE METABOLIC PANEL WITH GFR       Follow up plan: Return in about 6 months (around 05/07/2018) for follow-up visit with Dr. Sherie Don.  An after-visit summary was printed and given to the patient at check-out.  Please see the patient instructions which may contain other information and recommendations beyond what is mentioned above in the assessment and plan.  Meds ordered this encounter  Medications  . meloxicam (MOBIC) 15 MG tablet    Sig: Take 1 tablet (15 mg total) by mouth daily as needed for pain. Do not take any Aleve or naproxen    Dispense:  30 tablet     Refill:  0    Orders Placed This Encounter  Procedures  . Flu Vaccine QUAD 6+ mos PF IM (Fluarix Quad PF)  . Lipid panel  . Hemoglobin A1c  . COMPLETE METABOLIC PANEL WITH GFR  . Urinalysis w microscopic + reflex cultur  . Ambulatory referral to Physical Therapy  . Ambulatory referral to Orthopedic Surgery

## 2017-11-07 ENCOUNTER — Other Ambulatory Visit: Payer: Self-pay | Admitting: Family Medicine

## 2017-11-07 DIAGNOSIS — E782 Mixed hyperlipidemia: Secondary | ICD-10-CM

## 2017-11-07 LAB — URINALYSIS W MICROSCOPIC + REFLEX CULTURE
BACTERIA UA: NONE SEEN /HPF
Bilirubin Urine: NEGATIVE
Glucose, UA: NEGATIVE
HGB URINE DIPSTICK: NEGATIVE
HYALINE CAST: NONE SEEN /LPF
Ketones, ur: NEGATIVE
Leukocyte Esterase: NEGATIVE
Nitrites, Initial: NEGATIVE
PROTEIN: NEGATIVE
RBC / HPF: NONE SEEN /HPF (ref 0–2)
SQUAMOUS EPITHELIAL / LPF: NONE SEEN /HPF (ref <=5)
Specific Gravity, Urine: 1.023 (ref 1.001–1.03)
WBC UA: NONE SEEN /HPF (ref 0–5)
pH: <=5 (ref 5.0–8.0)

## 2017-11-07 LAB — COMPLETE METABOLIC PANEL WITH GFR
AG Ratio: 1.4 (calc) (ref 1.0–2.5)
ALBUMIN MSPROF: 4.3 g/dL (ref 3.6–5.1)
ALKALINE PHOSPHATASE (APISO): 95 U/L (ref 33–130)
ALT: 25 U/L (ref 6–29)
AST: 22 U/L (ref 10–35)
BUN: 18 mg/dL (ref 7–25)
CO2: 30 mmol/L (ref 20–32)
CREATININE: 0.81 mg/dL (ref 0.50–1.05)
Calcium: 10 mg/dL (ref 8.6–10.4)
Chloride: 103 mmol/L (ref 98–110)
GFR, Est African American: 95 mL/min/{1.73_m2} (ref >=60)
GFR, Est Non African American: 82 mL/min/{1.73_m2} (ref >=60)
GLOBULIN: 3 g/dL (ref 1.9–3.7)
Glucose, Bld: 88 mg/dL (ref 65–139)
Potassium: 3.8 mmol/L (ref 3.5–5.3)
SODIUM: 139 mmol/L (ref 135–146)
Total Bilirubin: 0.5 mg/dL (ref 0.2–1.2)
Total Protein: 7.3 g/dL (ref 6.1–8.1)

## 2017-11-07 LAB — LIPID PANEL
CHOLESTEROL: 245 mg/dL — AB (ref <200)
HDL: 67 mg/dL (ref >50)
LDL CHOLESTEROL (CALC): 153 mg/dL — AB
Non-HDL Cholesterol (Calc): 178 mg/dL (calc) — ABNORMAL HIGH (ref <130)
Total CHOL/HDL Ratio: 3.7 (calc) (ref <5.0)
Triglycerides: 123 mg/dL (ref <150)

## 2017-11-07 LAB — HEMOGLOBIN A1C
EAG (MMOL/L): 7.1 (calc)
Hgb A1c MFr Bld: 6.1 % of total Hgb — ABNORMAL HIGH (ref <5.7)
Mean Plasma Glucose: 128 (calc)

## 2017-11-07 LAB — NO CULTURE INDICATED

## 2017-11-07 MED ORDER — ATORVASTATIN CALCIUM 10 MG PO TABS: 10.0000 mg | ORAL_TABLET | Freq: Every day | ORAL | 1 refills | Status: DC

## 2017-11-07 NOTE — Progress Notes (Signed)
Start statin; recheck lipids in 6 weeks 

## 2017-11-27 ENCOUNTER — Ambulatory Visit: Payer: Self-pay

## 2017-12-31 ENCOUNTER — Ambulatory Visit: Payer: Medicaid Other | Admitting: Podiatry

## 2018-04-10 ENCOUNTER — Telehealth: Payer: Self-pay

## 2018-04-10 NOTE — Telephone Encounter (Signed)
Copied from CRM [id number]. Topic: General - Other >> Apr 10, 2018 10:42 AM Gerrianne Scale wrote: Reason for CRM: pt calling to let Dr Sherie Don know that she has an appt today with Dr Asa Saunas in [facility] she couldn't make referral last year due to insurance and the weather

## 2018-04-12 ENCOUNTER — Telehealth: Payer: Self-pay | Admitting: Family Medicine

## 2018-04-12 NOTE — Telephone Encounter (Signed)
Pt denied appt already scheduled for the 3rd.  She states gums are sore, last year she had all her teeth pulled and is goingto be fit for dentures here soon.

## 2018-04-12 NOTE — Telephone Encounter (Signed)
Copied from CRM [id number]. Topic: Quick Communication - Rx Refill/Question >> Apr 12, 2018 11:06 AM Gerrianne Scale wrote: Medication: magic mouthwash w/lidocaine SOLN    woke up with her gums hurting she has used this before because all her gums fit for teeth on 05-07-18  Has the patient contacted their pharmacy? No.   (Agent: If no, request that the patient contact the pharmacy for the refill.) (Agent: If yes, when and what did the pharmacy advise?)  Preferred Pharmacy (with phone number or street name): [facility] [address] ([address]), [place] - [address] [phone] (Phone) [phone] (Fax)    Agent: Please be advised that RX refills may take up to 3 business days. We ask that you follow-up with your pharmacy.

## 2018-04-12 NOTE — Telephone Encounter (Signed)
I don't understand the phrase "all her gums fit for teeth on 05-07-18"  I don't see that we have ever prescribed that before I don't know what I would be treating, so we'll recommend an appointment or she can go to urgent care

## 2018-04-23 ENCOUNTER — Ambulatory Visit (INDEPENDENT_AMBULATORY_CARE_PROVIDER_SITE_OTHER): Payer: BLUE CROSS/BLUE SHIELD | Admitting: Family Medicine

## 2018-04-23 ENCOUNTER — Encounter: Payer: Self-pay | Admitting: Family Medicine

## 2018-04-23 VITALS — BP 126/80 | HR 57 | Temp 98.1°F | Resp 12 | Ht 63.0 in | Wt 167.3 lb

## 2018-04-23 DIAGNOSIS — I493 Ventricular premature depolarization: Secondary | ICD-10-CM

## 2018-04-23 DIAGNOSIS — R7303 Prediabetes: Secondary | ICD-10-CM | POA: Diagnosis not present

## 2018-04-23 DIAGNOSIS — E782 Mixed hyperlipidemia: Secondary | ICD-10-CM

## 2018-04-23 DIAGNOSIS — I340 Nonrheumatic mitral (valve) insufficiency: Secondary | ICD-10-CM

## 2018-04-23 DIAGNOSIS — R001 Bradycardia, unspecified: Secondary | ICD-10-CM

## 2018-04-23 DIAGNOSIS — I10 Essential (primary) hypertension: Secondary | ICD-10-CM

## 2018-04-23 DIAGNOSIS — R42 Dizziness and giddiness: Secondary | ICD-10-CM

## 2018-04-23 DIAGNOSIS — M4726 Other spondylosis with radiculopathy, lumbar region: Secondary | ICD-10-CM

## 2018-04-23 DIAGNOSIS — E663 Overweight: Secondary | ICD-10-CM | POA: Insufficient documentation

## 2018-04-23 NOTE — Assessment & Plan Note (Addendum)
Check A1c today; unable to check in-house (POCT) glucose because of battery issue with our machine; check glucose with labs today; patient was given a cookie right before blood drawn since she felt dizzy from fasting

## 2018-04-23 NOTE — Progress Notes (Addendum)
BP 126/80   Pulse (!) 57   Temp 98.1 F (36.7 C) (Oral)   Resp 12   Ht 5\' 3"  (1.6 m)   Wt 167 lb 4.8 oz (75.9 kg)   SpO2 98%   BMI 29.64 kg/m    Subjective:    Patient ID: Brenda Ellison, female    DOB: [birth year], 58 y.o.   MRN: [id number]  HPI: Brenda Ellison is a 58 y.o. female  Chief Complaint  Patient presents with  . Follow-up  . Dizziness    some vision changes    HPI Patient is here for f/u She felt a little dizzy walking in today; not on any medicine for diabetes; only thing to eat all day is a bowl of cereal this morning High cholesterol; she is trying to do all the recommendation about limiting saturated fats; no fried foods; just baking No cholesterol medicine since Christmas; no problems on the medicine; not aware of high chol in the family, but high blood pressure Overweight; she thinks maybe 150 pounds would be a good weight; she loves to eat She is a good water drinker She does skip a lot of meals Does not eat out very often She has been fasting with her church; she is doing a gradual fast without fried foods; no sweets She had a mouth issue; she is seeing dentist and going to get dentures soon; all teeth were pulled, middle of 2019; has been without teeth for 6-9 months she estimates; she had an abscess on her gum a few weeks ago, toughed it out and used H2O2; no fevers Prediabetes; avoiding sugar; some dry mouth; blurred vision Arthritis; seeing doctor at the [facility]; knuckles in the hands are enlarged She has spinal stenosis and they just did a scan of her back; they want to have her talk to a surgeon  Depression screen [facility] 2/9 04/23/2018 11/06/2017 05/03/2017 01/17/2017 12/19/2016  Decreased Interest 0 0 0 0 0  Down, Depressed, Hopeless 0 0 0 0 0  PHQ - 2 Score 0 0 0 0 0  Altered sleeping 0 - - - -  Tired, decreased energy 0 - - - -  Change in appetite 0 - - - -  Feeling bad or failure about yourself  0 - - - -  Trouble concentrating 0 - - -  -  Moving slowly or fidgety/restless 0 - - - -  Suicidal thoughts 0 - - - -  PHQ-9 Score 0 - - - -  Difficult doing work/chores Not difficult at all - - - -   Fall Risk  04/23/2018 11/06/2017 05/03/2017 01/17/2017 12/19/2016  Falls in the past year? 0 No No No No  Number falls in past yr: 0 - - - -  Injury with Fall? 0 - - - -    Relevant past medical, surgical, family and social history reviewed Past Medical History:  Diagnosis Date  . Arthritis pain   . Bilateral leg pain   . Chronic nasal congestion   . Dizziness of unknown cause   . Heart murmur previously undiagnosed   . Osteoarthritis, hand    Past Surgical History:  Procedure Laterality Date  . COLONOSCOPY WITH PROPOFOL N/A 05/15/2017   Procedure: COLONOSCOPY WITH PROPOFOL;  Surgeon: Wyline Mood, MD;  Location[facility] ENDOSCOPY;  Service: Gastroenterology;  Laterality: N/A;  . HAND RECONSTRUCTION Bilateral    as a child   Family History  Problem Relation Age of Onset  . Diabetes Brother   .  Heart attack Mother   . Breast cancer Neg Hx   . Ovarian cancer Neg Hx   . Colon cancer Neg Hx   . Heart disease Neg Hx    Social History   Tobacco Use  . Smoking status: Former Smoker    Last attempt to quit: 1990    Years since quitting: 30.1  . Smokeless tobacco: Never Used  Substance Use Topics  . Alcohol use: No  . Drug use: No     Office Visit from 04/23/2018 in [facility]  AUDIT-C Score  0      Interim medical history since last visit reviewed. Allergies and medications reviewed  Review of Systems Per HPI unless specifically indicated above     Objective:    BP 126/80   Pulse (!) 57   Temp 98.1 F (36.7 C) (Oral)   Resp 12   Ht 5\' 3"  (1.6 m)   Wt 167 lb 4.8 oz (75.9 kg)   SpO2 98%   BMI 29.64 kg/m   Wt Readings from Last 3 Encounters:  04/23/18 167 lb 4.8 oz (75.9 kg)  11/06/17 161 lb 11.2 oz (73.3 kg)  07/13/17 167 lb (75.8 kg)    Physical Exam Constitutional:      General:  She is not in acute distress.    Appearance: She is well-developed. She is not diaphoretic.  HENT:     Head: Normocephalic and atraumatic.  Eyes:     General: No scleral icterus. Neck:     Thyroid: No thyromegaly.  Cardiovascular:     Rate and Rhythm: Regular rhythm. Bradycardia present.     Heart sounds: Normal heart sounds. No murmur.  Pulmonary:     Effort: Pulmonary effort is normal. No respiratory distress.     Breath sounds: Normal breath sounds. No wheezing.  Abdominal:     General: Bowel sounds are normal. There is no distension.     Palpations: Abdomen is soft.  Musculoskeletal:     Left hand: She exhibits deformity. She exhibits normal range of motion.       Hands:     Comments: heberdens and bouchards nodes both hands  Skin:    General: Skin is warm and dry.     Coloration: Skin is not pale.  Neurological:     Mental Status: She is alert.  Psychiatric:        Mood and Affect: Mood is not anxious or depressed.        Behavior: Behavior normal.        Thought Content: Thought content normal.        Judgment: Judgment normal.     Results for orders placed or performed in visit on 11/06/17  Lipid panel  Result Value Ref Range   Cholesterol 245 (H) <200 mg/dL   HDL 67 >95 mg/dL   Triglycerides 188 <416 mg/dL   LDL Cholesterol (Calc) 153 (H) mg/dL (calc)   Total CHOL/HDL Ratio 3.7 <5.0 (calc)   Non-HDL Cholesterol (Calc) 178 (H) <130 mg/dL (calc)  Hemoglobin S0Y  Result Value Ref Range   Hgb A1c MFr Bld 6.1 (H) <5.7 % of total Hgb   Mean Plasma Glucose 128 (calc)   eAG (mmol/L) 7.1 (calc)  COMPLETE METABOLIC PANEL WITH GFR  Result Value Ref Range   Glucose, Bld 88 65 - 139 mg/dL   BUN 18 7 - 25 mg/dL   Creat 3.01 6.01 - 0.93 mg/dL   GFR, Est Non African American 82 >  OR = 60 mL/min/1.69m2   GFR, Est African American 95 > OR = 60 mL/min/1.54m2   BUN/Creatinine Ratio NOT APPLICABLE 6 - 22 (calc)   Sodium 139 135 - 146 mmol/L   Potassium 3.8 3.5 - 5.3  mmol/L   Chloride 103 98 - 110 mmol/L   CO2 30 20 - 32 mmol/L   Calcium 10.0 8.6 - 10.4 mg/dL   Total Protein 7.3 6.1 - 8.1 g/dL   Albumin 4.3 3.6 - 5.1 g/dL   Globulin 3.0 1.9 - 3.7 g/dL (calc)   AG Ratio 1.4 1.0 - 2.5 (calc)   Total Bilirubin 0.5 0.2 - 1.2 mg/dL   Alkaline phosphatase (APISO) 95 33 - 130 U/L   AST 22 10 - 35 U/L   ALT 25 6 - 29 U/L  Urinalysis w microscopic + reflex cultur  Result Value Ref Range   Color, Urine YELLOW YELLOW   APPearance CLEAR CLEAR   Specific Gravity, Urine 1.023 1.001 - 1.03   pH < OR = 5.0 5.0 - 8.0   Glucose, UA NEGATIVE NEGATIVE   Bilirubin Urine NEGATIVE NEGATIVE   Ketones, ur NEGATIVE NEGATIVE   Hgb urine dipstick NEGATIVE NEGATIVE   Protein, ur NEGATIVE NEGATIVE   Nitrites, Initial NEGATIVE NEGATIVE   Leukocyte Esterase NEGATIVE NEGATIVE   WBC, UA NONE SEEN 0 - 5 /HPF   RBC / HPF NONE SEEN 0 - 2 /HPF   Squamous Epithelial / LPF NONE SEEN < OR = 5 /HPF   Bacteria, UA NONE SEEN NONE SEEN /HPF   Hyaline Cast NONE SEEN NONE SEEN /LPF  REFLEXIVE URINE CULTURE  Result Value Ref Range   Reflexve Urine Culture NO CULTURE INDICATED       Assessment & Plan:   Problem List Items Addressed This Visit      Cardiovascular and Mediastinum   Moderate mitral insufficiency    Seeing cardiologist      Frequent PVCs    Seeing cardiologist, Dr. Gwen Pounds, at the [facility]      Benign essential HTN - Primary    Under fair control; limit salt; try to lose weight        Musculoskeletal and Integument   Degenerative arthritis of lumbar spine    Seeing rheum at [facility]        Other   Prediabetes    Check A1c today; unable to check in-house (POCT) glucose because of battery issue with our machine; check glucose with labs today; patient was given a cookie right before blood drawn since she felt dizzy from fasting      Relevant Orders   BASIC METABOLIC PANEL WITH GFR   Hemoglobin A1c   Overweight (BMI 25.0-29.9)    Try to lose  5-10 pounds; see AVS      Mixed hyperlipidemia    Check cholesterol; see if statin still needed      Relevant Orders   Lipid panel    Other Visit Diagnoses    Dizziness       gave patient water and a cookie; unable to check POCT glucose (no battery for machine I am told); checking orthostatics; don't skip meals   Bradycardia       check TSH   Relevant Orders   TSH      Follow up plan: Return in about 6 months (around 10/24/2018) for follow-up visit with Dr. Sherie Don.  An after-visit summary was printed and given to the patient at check-out.  Please see the patient instructions which  may contain other information and recommendations beyond what is mentioned above in the assessment and plan.  No orders of the defined types were placed in this encounter.   Orders Placed This Encounter  Procedures  . BASIC METABOLIC PANEL WITH GFR  . Hemoglobin A1c  . Lipid panel  . TSH

## 2018-04-23 NOTE — Assessment & Plan Note (Signed)
Seeing rheum at [facility]

## 2018-04-23 NOTE — Assessment & Plan Note (Signed)
Check cholesterol; see if statin still needed

## 2018-04-23 NOTE — Patient Instructions (Addendum)
Check out the information at familydoctor.org entitled "Nutrition for Weight Loss: What You Need to Know about Fad Diets" Try to lose between 1-2 pounds per week by taking in fewer calories and burning off more calories You can succeed by limiting portions, limiting foods dense in calories and fat, becoming more active, and drinking 8 glasses of water a day (64 ounces) Don't skip meals, especially breakfast, as skipping meals may alter your metabolism Do not use over-the-counter weight loss pills or gimmicks that claim rapid weight loss A healthy BMI (or body mass index) is between 18.5 and 24.9 You can calculate your ideal BMI at the NIH website JobEconomics.hu   Cholesterol  Cholesterol is a fat. Your body needs a small amount of cholesterol. Cholesterol (plaque) may build up in your blood vessels (arteries). That makes you more likely to have a heart attack or stroke. You cannot feel your cholesterol level. Having a blood test is the only way to find out if your level is high. Keep your test results. Work with your doctor to keep your cholesterol at a good level. What do the results mean?  Total cholesterol is how much cholesterol is in your blood.  LDL is bad cholesterol. This is the type that can build up. Try to have low LDL.  HDL is good cholesterol. It cleans your blood vessels and carries LDL away. Try to have high HDL.  Triglycerides are fat that the body can store or burn for energy. What are good levels of cholesterol?  Total cholesterol below 200.  LDL below 100 is good for people who have health risks. LDL below 70 is good for people who have very high risks.  HDL above 40 is good. It is best to have HDL of 60 or higher.  Triglycerides below 150. How can I lower my cholesterol? Diet Follow your diet program as told by your doctor.  Choose fish, white meat chicken, or [place] that is roasted or baked. Try not to eat  red meat, fried foods, sausage, or lunch meats.  Eat lots of fresh fruits and vegetables.  Choose whole grains, beans, pasta, potatoes, and cereals.  Choose olive oil, corn oil, or canola oil. Only use small amounts.  Try not to eat butter, mayonnaise, shortening, or palm kernel oils.  Try not to eat foods with trans fats.  Choose low-fat or nonfat dairy foods. ? Drink skim or nonfat milk. ? Eat low-fat or nonfat yogurt and cheeses. ? Try not to drink whole milk or cream. ? Try not to eat ice cream, egg yolks, or full-fat cheeses.  Healthy desserts include angel food cake, ginger snaps, animal crackers, hard candy, popsicles, and low-fat or nonfat frozen yogurt. Try not to eat pastries, cakes, pies, and cookies.  Exercise Follow your exercise program as told by your doctor.  Be more active. Try gardening, walking, and taking the stairs.  Ask your doctor about ways that you can be more active. Medicine  Take over-the-counter and prescription medicines only as told by your doctor. This information is not intended to replace advice given to you by your health care provider. Make sure you discuss any questions you have with your health care provider. Document Released: 05/05/2008 Document Revised: 09/08/2015 Document Reviewed: 08/19/2015 Elsevier Interactive Patient Education  2019 Elsevier Inc.  Preventing Unhealthy [employer], Adult Staying at a healthy weight is important to your overall health. When fat builds up in your body, you may become overweight or obese. Being overweight or obese  increases your risk of developing certain health problems, such as heart disease, diabetes, sleeping problems, joint problems, and some types of cancer. Unhealthy weight gain is often the result of making unhealthy food choices or not getting enough exercise. You can make changes to your lifestyle to prevent obesity and stay as healthy as possible. What nutrition changes can be made?   Eat  only as much as your body needs. To do this: ? Pay attention to signs that you are hungry or full. Stop eating as soon as you feel full. ? If you feel hungry, try drinking water first before eating. Drink enough water so your urine is clear or pale yellow. ? Eat smaller portions. Pay attention to portion sizes when eating out. ? Look at serving sizes on food labels. Most foods contain more than one serving per container. ? Eat the recommended number of calories for your gender and activity level. For most active people, a daily total of 2,000 calories is appropriate. If you are trying to lose weight or are not very active, you may need to eat fewer calories. Talk with your health care provider or a diet and nutrition specialist (dietitian) about how many calories you need each day.  Choose healthy foods, such as: ? Fruits and vegetables. At each meal, try to fill at least half of your plate with fruits and vegetables. ? Whole grains, such as whole-wheat bread, brown rice, and quinoa. ? Lean meats, such as chicken or fish. ? Other healthy proteins, such as beans, eggs, or tofu. ? Healthy fats, such as nuts, seeds, fatty fish, and olive oil. ? Low-fat or fat-free dairy products.  Check food labels, and avoid food and drinks that: ? Are high in calories. ? Have added sugar. ? Are high in sodium. ? Have saturated fats or trans fats.  Cook foods in healthier ways, such as by baking, broiling, or grilling.  Make a meal plan for the week, and shop with a grocery list to help you stay on track with your purchases. Try to avoid going to the grocery store when you are hungry.  When grocery shopping, try to shop around the outside of the store first, where the fresh foods are. Doing this helps you to avoid prepackaged foods, which can be high in sugar, salt (sodium), and fat. What lifestyle changes can be made?   Exercise for 30 or more minutes on 5 or more days each week. Exercising may include  brisk walking, yard work, biking, running, swimming, and team sports like basketball and soccer. Ask your health care provider which exercises are safe for you.  Do muscle-strengthening activities, such as lifting weights or using resistance bands, on 2 or more days a week.  Do not use any products that contain nicotine or tobacco, such as cigarettes and e-cigarettes. If you need help quitting, ask your health care provider.  Limit alcohol intake to no more than 1 drink a day for nonpregnant women and 2 drinks a day for men. One drink equals 12 oz of beer, 5 oz of wine, or 1 oz of hard liquor.  Try to get 7-9 hours of sleep each night. What other changes can be made?  Keep a food and activity journal to keep track of: ? What you ate and how many calories you had. Remember to count the calories in sauces, dressings, and side dishes. ? Whether you were active, and what exercises you did. ? Your calorie, weight, and activity goals.  Check  your weight regularly. Track any changes. If you notice you have gained weight, make changes to your diet or activity routine.  Avoid taking weight-loss medicines or supplements. Talk to your health care provider before starting any new medicine or supplement.  Talk to your health care provider before trying any new diet or exercise plan. Why are these changes important? Eating healthy, staying active, and having healthy habits can help you to prevent obesity. Those changes also:  Help you manage stress and emotions.  Help you connect with friends and family.  Improve your self-esteem.  Improve your sleep.  Prevent long-term health problems. What can happen if changes are not made? Being obese or overweight can cause you to develop joint or bone problems, which can make it hard for you to stay active or do activities you enjoy. Being obese or overweight also puts stress on your heart and lungs and can lead to health problems like diabetes, heart  disease, and some cancers. Where to find more information Talk with your health care provider or a dietitian about healthy eating and healthy lifestyle choices. You may also find information from:  U.S. Department of Agriculture, MyPlate: https://ball-collins.biz/  American Heart Association: www.heart.org  Centers for Disease Control and Prevention: FootballExhibition.com.br Summary  Staying at a healthy weight is important to your overall health. It helps you to prevent certain diseases and health problems, such as heart disease, diabetes, joint problems, sleep disorders, and some types of cancer.  Being obese or overweight can cause you to develop joint or bone problems, which can make it hard for you to stay active or do activities you enjoy.  You can prevent unhealthy weight gain by eating a healthy diet, exercising regularly, not smoking, limiting alcohol, and getting enough sleep.  Talk with your health care provider or a dietitian for guidance about healthy eating and healthy lifestyle choices. This information is not intended to replace advice given to you by your health care provider. Make sure you discuss any questions you have with your health care provider. Document Released: 02/08/2016 Document Revised: 11/17/2016 Document Reviewed: 03/15/2016 Elsevier Interactive Patient Education  2019 [employer].

## 2018-04-23 NOTE — Assessment & Plan Note (Signed)
Try to lose 5-10 pounds; see AVS 

## 2018-04-23 NOTE — Assessment & Plan Note (Signed)
Seeing cardiologist 

## 2018-04-23 NOTE — Assessment & Plan Note (Signed)
Seeing cardiologist, Dr. Gwen Pounds, at the [facility]

## 2018-04-23 NOTE — Assessment & Plan Note (Signed)
Under fair control; limit salt; try to lose weight

## 2018-04-23 NOTE — Addendum Note (Signed)
Addended by: Sanyla Summey, Janit Bern on: 04/23/2018 03:50 PM   Modules accepted: Orders

## 2018-04-24 LAB — LIPID PANEL
CHOL/HDL RATIO: 3.7 (calc) (ref <5.0)
Cholesterol: 225 mg/dL — ABNORMAL HIGH (ref <200)
HDL: 61 mg/dL (ref >=50)
LDL CHOLESTEROL (CALC): 136 mg/dL — AB
NON-HDL CHOLESTEROL (CALC): 164 mg/dL — AB (ref <130)
Triglycerides: 149 mg/dL (ref <150)

## 2018-04-24 LAB — HEMOGLOBIN A1C
Hgb A1c MFr Bld: 6.1 % of total Hgb — ABNORMAL HIGH (ref <5.7)
Mean Plasma Glucose: 128 (calc)
eAG (mmol/L): 7.1 (calc)

## 2018-04-24 LAB — BASIC METABOLIC PANEL WITH GFR
BUN: 16 mg/dL (ref 7–25)
CO2: 27 mmol/L (ref 20–32)
Calcium: 10 mg/dL (ref 8.6–10.4)
Chloride: 104 mmol/L (ref 98–110)
Creat: 0.78 mg/dL (ref 0.50–1.05)
GFR, EST AFRICAN AMERICAN: 99 mL/min/{1.73_m2} (ref >=60)
GFR, Est Non African American: 86 mL/min/{1.73_m2} (ref >=60)
Glucose, Bld: 124 mg/dL — ABNORMAL HIGH (ref 65–99)
POTASSIUM: 4.1 mmol/L (ref 3.5–5.3)
SODIUM: 139 mmol/L (ref 135–146)

## 2018-04-24 LAB — TSH: TSH: 0.48 mIU/L

## 2018-05-08 ENCOUNTER — Ambulatory Visit: Payer: Self-pay | Admitting: Family Medicine

## 2018-05-13 ENCOUNTER — Other Ambulatory Visit: Payer: Self-pay | Admitting: Family Medicine

## 2018-05-13 DIAGNOSIS — Z1231 Encounter for screening mammogram for malignant neoplasm of breast: Secondary | ICD-10-CM

## 2018-08-29 ENCOUNTER — Encounter: Payer: Self-pay | Admitting: Podiatry

## 2018-08-29 ENCOUNTER — Other Ambulatory Visit: Payer: Self-pay

## 2018-08-29 ENCOUNTER — Ambulatory Visit (INDEPENDENT_AMBULATORY_CARE_PROVIDER_SITE_OTHER): Payer: BLUE CROSS/BLUE SHIELD | Admitting: Podiatry

## 2018-08-29 DIAGNOSIS — M79674 Pain in right toe(s): Secondary | ICD-10-CM

## 2018-08-29 DIAGNOSIS — M79675 Pain in left toe(s): Secondary | ICD-10-CM

## 2018-08-29 DIAGNOSIS — B351 Tinea unguium: Secondary | ICD-10-CM

## 2018-08-29 NOTE — Progress Notes (Signed)
Complaint:  Visit Type: Patient returns to my office for continued preventative foot care services. Complaint: Patient states" my nails have grown long and thick and become painful to walk and wear shoes" . The patient presents for preventative foot care services. No changes to ROS  Podiatric Exam: Vascular: dorsalis pedis and posterior tibial pulses are palpable bilateral. Capillary return is immediate. Temperature gradient is WNL. Skin turgor WNL  Sensorium: Normal Semmes Weinstein monofilament test. Normal tactile sensation bilaterally. Nail Exam: Pt has thick disfigured discolored nails with subungual debris noted bilateral entire nail hallux through fifth toenails Ulcer Exam: There is no evidence of ulcer or pre-ulcerative changes or infection. Orthopedic Exam: Muscle tone and strength are WNL. No limitations in general ROM. No crepitus or effusions noted. Foot type and digits show no abnormalities. Bony prominences are unremarkable. 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  Hammer toes 2-5  B/L. Skin: No Porokeratosis. No infection or ulcers  Diagnosis:  Onychomycosis, , Pain in right toe, pain in left toes  Treatment & Plan Procedures and Treatment: Consent by patient was obtained for treatment procedures. The patient understood the discussion of treatment and procedures well. All questions were answered thoroughly reviewed. Debridement of mycotic and hypertrophic toenails, 1 through 5 bilateral and clearing of subungual debris. No ulceration, no infection noted.  Return Visit-Office Procedure: Patient instructed to return to the office for a follow up visit prn for continued evaluation and treatment.    Gardiner Barefoot DPM

## 2018-09-30 DIAGNOSIS — I2089 Other forms of angina pectoris: Secondary | ICD-10-CM | POA: Insufficient documentation

## 2018-09-30 DIAGNOSIS — I208 Other forms of angina pectoris: Secondary | ICD-10-CM | POA: Insufficient documentation

## 2018-10-24 ENCOUNTER — Ambulatory Visit: Payer: BLUE CROSS/BLUE SHIELD | Admitting: Family Medicine

## 2018-10-25 ENCOUNTER — Encounter: Payer: Self-pay | Admitting: Nurse Practitioner

## 2018-10-25 ENCOUNTER — Ambulatory Visit (INDEPENDENT_AMBULATORY_CARE_PROVIDER_SITE_OTHER): Payer: BLUE CROSS/BLUE SHIELD | Admitting: Nurse Practitioner

## 2018-10-25 ENCOUNTER — Other Ambulatory Visit: Payer: Self-pay

## 2018-10-25 VITALS — BP 112/66 | HR 81 | Temp 96.6°F | Resp 14 | Ht 64.0 in | Wt 173.9 lb

## 2018-10-25 DIAGNOSIS — I208 Other forms of angina pectoris: Secondary | ICD-10-CM

## 2018-10-25 DIAGNOSIS — R7303 Prediabetes: Secondary | ICD-10-CM

## 2018-10-25 DIAGNOSIS — M5416 Radiculopathy, lumbar region: Secondary | ICD-10-CM | POA: Diagnosis not present

## 2018-10-25 DIAGNOSIS — M5136 Other intervertebral disc degeneration, lumbar region: Secondary | ICD-10-CM

## 2018-10-25 DIAGNOSIS — E782 Mixed hyperlipidemia: Secondary | ICD-10-CM

## 2018-10-25 DIAGNOSIS — M5412 Radiculopathy, cervical region: Secondary | ICD-10-CM | POA: Insufficient documentation

## 2018-10-25 NOTE — Patient Instructions (Addendum)
-   Please come within the next week for blood work; Monday- Friday from 8am-12pm OR 2-4pm. It would be ideal if you were fasting- or if you need to eat please eat something healthy such as salad, oatmeal ect- something without a lot of cholesterol.   Bad cholesterol, also called low-density lipoprotein (LDL), carries cholesterol and other fats that your liver makes to your body tissue. If it builds up in blood vessels, LDL can cause heart disease and other health problems. Your LDL level should be below 100. If you have diabetes or a possible heart problem, your LDL should be below 70.  Eat: Eat 20 to 30 grams of soluble fiber every day. Foods such as fruits and vegetables, whole grains, beans, peas, nuts, and seeds can help lower LDL. Avoid: Saturated fats (Dairy foods - such as butter, cream, ghee, regular-fat milk and cheese. Meat - such as fatty cuts of beef, pork and lamb, processed meats like salami, sausages and the skin on chicken. Lard., fatty snack foods, cakes, biscuits, pies and deep fried foods) Avoid smoking _________ Foods and drinks to limit include  . fried foods and other foods high in saturated fat and trans fat  . foods high in salt, also called sodium  . sweets, such as baked goods, candy, and ice cream  . beverages with added sugars, such as juice, regular soda, and regular sports or energy drinks  Drink water instead of sweetened beverages. Consider using a sugar substitute in your coffee or tea.   Instead, eat carbohydrates from fruit, vegetables, whole grains, beans, and low-fat or nonfat milk. Choose healthy carbohydrates, such as fruit, vegetables, whole grains, beans, and low-fat milk, as part of your prediabetes meal plan.

## 2018-10-25 NOTE — Progress Notes (Signed)
Name: Brenda MaresMary A Ellison   MRN: [id number]    DOB: 10/31/1960   Date:10/25/2018       Progress Note  Subjective  Chief Complaint  Chief Complaint  Patient presents with  . Follow-up  . Arthritis    would like something for pain    HPI  Patient presents for routine follow-up.  Elevated BP Noted in 2016- no firm diagnosis of hypertension. controlled with lifestyle modifications. BP Readings from Last 3 Encounters:  10/25/18 112/66  04/23/18 126/80  11/06/17 128/78   Prediabetes States eats a lot of breads, rice and sweets and pastas.  Lab Results  Component Value Date   HGBA1C 6.1 (H) 04/23/2018    Lumbar radiculopathy She is taking baclofen and mobic PRN- takes one tablet a day usually. Follows up with Dr. Oneida ArenasIngraham.  Takes with food.   Hyperlipidemia Diet:ate 2 sausage biscuits this morning, and a sloppy joe today  Drinks 2-5 bottles of water a day.  Lab Results  Component Value Date   CHOL 225 (H) 04/23/2018   HDL 61 04/23/2018   LDLCALC 136 (H) 04/23/2018   TRIG 149 04/23/2018   CHOLHDL 3.7 04/23/2018    PHQ2/9: [facility] screen [facility] 2/[facility] 10/25/2018 04/23/2018 11/06/2017 05/03/2017 01/17/2017  Decreased Interest 0 0 0 0 0  Down, Depressed, Hopeless 0 0 0 0 0  PHQ - 2 Score 0 0 0 0 0  Altered sleeping 0 0 - - -  Tired, decreased energy 0 0 - - -  Change in appetite 0 0 - - -  Feeling bad or failure about yourself  0 0 - - -  Trouble concentrating 0 0 - - -  Moving slowly or fidgety/restless 0 0 - - -  Suicidal thoughts 0 0 - - -  PHQ-9 Score 0 0 - - -  Difficult doing work/chores Not difficult at all Not difficult at all - - -    PHQ reviewed. Negative  Patient Active Problem List   Diagnosis Date Noted  . Overweight (BMI 25.0-29.9) 04/23/2018  . DDD (degenerative disc disease), lumbar 11/06/2017  . Cardiac murmur 03/16/2017  . Lumbar radiculopathy 03/16/2017  . Prediabetes 07/12/2016  . Osteoarthritis 06/07/2015  . Mixed hyperlipidemia 06/01/2015  .  Degenerative arthritis of hip 10/19/2014  . Arthritis of hand, degenerative 10/05/2014  . Benign essential HTN 05/01/2014  . 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 Moderate mitral insufficiency 05/01/2014    Past Medical History:  Diagnosis Date  . Arthritis pain   . Bilateral leg pain   . Chronic nasal congestion   . Dizziness of unknown cause   . Heart murmur previously undiagnosed   . Osteoarthritis, hand     Past Surgical History:  Procedure Laterality Date  . COLONOSCOPY WITH PROPOFOL N/A 05/15/2017   Procedure: COLONOSCOPY WITH PROPOFOL;  Surgeon: Wyline MoodAnna, Kiran, MD;  [facility];  Service: Gastroenterology;  Laterality: N/A;  . HAND RECONSTRUCTION Bilateral    as a child    Social History   Tobacco Use  . Smoking status: Former Smoker    Quit date: 1990    Years since quitting: 30.6  . Smokeless tobacco: Never Used  Substance Use Topics  . Alcohol use: No     Current Outpatient Medications:  .  baclofen (LIORESAL) 10 MG tablet, TAKE 1 TABLET BY MOUTH THREE TIMES DAILY AS NEEDED FOR MUSCLE SPASM OR PAIN, Disp: , Rfl:  .  Cholecalciferol (VITAMIN D3) 2000 UNITS capsule, Take 2,000 Units by mouth daily. , Disp: ,  Rfl:  .  meloxicam (MOBIC) 15 MG tablet, Take 1 tab PO daily x 2 weeks, then daily as needed for pain., Disp: , Rfl:  .  acetaminophen (TYLENOL) 325 MG tablet, Take 1-2 tablets (325-650 mg total) by mouth every 6 (six) hours as needed. Maximum dose is 3,000 mg per 24 hours, Disp: , Rfl:   Allergies  Allergen Reactions  . Tramadol Rash    Review of Systems  Constitutional: Negative for chills, fever and malaise/fatigue.  Respiratory: Negative for cough and shortness of breath (resolved).   Cardiovascular: Negative for chest pain (resolved), palpitations and leg swelling.  Gastrointestinal: Negative for abdominal pain and diarrhea.  Musculoskeletal: Positive for back pain and neck pain. Negative for joint pain and myalgias.  Skin: Negative for rash.   Neurological: Negative for dizziness, tingling and headaches.  Psychiatric/Behavioral: The patient is not nervous/anxious and does not have insomnia.      No other specific complaints in a complete review of systems (except as listed in HPI above).  Objective  Vitals:   10/25/18 1537  BP: 112/66  Pulse: 81  Resp: 14  Temp: (!) 96.6 F (35.9 C)  SpO2: 93%  Weight: 173 lb 14.4 oz (78.9 kg)  Height: 5\' 4"  (1.626 m)     Body mass index is 29.85 kg/m.  Nursing Note and Vital Signs reviewed.  Physical Exam Constitutional:      Appearance: Normal appearance. She is well-developed.  HENT:     Head: Normocephalic and atraumatic.     Right Ear: Hearing normal.     Left Ear: Hearing normal.  Eyes:     Conjunctiva/sclera: Conjunctivae normal.  Cardiovascular:     Rate and Rhythm: Normal rate and regular rhythm.     Heart sounds: Normal heart sounds.  Pulmonary:     Effort: Pulmonary effort is normal.     Breath sounds: Normal breath sounds.  Neurological:     Mental Status: She is alert and oriented to person, place, and time.  Psychiatric:        Speech: Speech normal.        Behavior: Behavior normal. Behavior is cooperative.        Thought Content: Thought content normal.        Judgment: Judgment normal.       No results found for this or any previous visit (from the past 48 hour(s)).  Assessment & Plan  1. Stable angina pectoris ([place]) Resolved, follow up with cards   2. Lumbar radiculopathy Follows up with ortho and PT   3. Prediabetes Discussed diet  - HgB A1c - COMPLETE METABOLIC PANEL WITH GFR  4. Mixed hyperlipidemia Discussed diet  - Lipid Profile  5. Cervical radiculopathy Follows up with ortho and PT   6. DDD (degenerative disc disease), lumbar Follows up with ortho and PT

## 2018-12-03 ENCOUNTER — Other Ambulatory Visit: Payer: Self-pay

## 2018-12-03 DIAGNOSIS — Z20822 Contact with and (suspected) exposure to covid-19: Secondary | ICD-10-CM

## 2018-12-05 LAB — NOVEL CORONAVIRUS, NAA: SARS-CoV-2, NAA: NOT DETECTED

## 2019-01-28 ENCOUNTER — Encounter: Payer: BLUE CROSS/BLUE SHIELD | Admitting: Family Medicine

## 2019-02-04 ENCOUNTER — Ambulatory Visit (INDEPENDENT_AMBULATORY_CARE_PROVIDER_SITE_OTHER): Payer: BLUE CROSS/BLUE SHIELD | Admitting: Family Medicine

## 2019-02-04 ENCOUNTER — Other Ambulatory Visit: Payer: Self-pay

## 2019-02-04 ENCOUNTER — Encounter: Payer: Self-pay | Admitting: Family Medicine

## 2019-02-04 VITALS — BP 118/80 | HR 85 | Temp 97.6°F | Resp 14 | Ht 64.0 in | Wt 179.7 lb

## 2019-02-04 DIAGNOSIS — R7303 Prediabetes: Secondary | ICD-10-CM

## 2019-02-04 DIAGNOSIS — Z13228 Encounter for screening for other metabolic disorders: Secondary | ICD-10-CM

## 2019-02-04 DIAGNOSIS — E559 Vitamin D deficiency, unspecified: Secondary | ICD-10-CM

## 2019-02-04 DIAGNOSIS — Z13220 Encounter for screening for lipoid disorders: Secondary | ICD-10-CM | POA: Diagnosis not present

## 2019-02-04 DIAGNOSIS — Z1329 Encounter for screening for other suspected endocrine disorder: Secondary | ICD-10-CM

## 2019-02-04 DIAGNOSIS — Z780 Asymptomatic menopausal state: Secondary | ICD-10-CM

## 2019-02-04 DIAGNOSIS — Z130 Encounter for screening for diseases of the blood and blood-forming organs and certain disorders involving the immune mechanism: Secondary | ICD-10-CM

## 2019-02-04 DIAGNOSIS — Z0000 Encounter for general adult medical examination without abnormal findings: Secondary | ICD-10-CM | POA: Diagnosis not present

## 2019-02-04 NOTE — Patient Instructions (Signed)
Preventive Care 40-56 Years Old, Female Preventive care refers to visits with your health care provider and lifestyle choices that can promote health and wellness. This includes:  A yearly physical exam. This may also be called an annual well check.  Regular dental visits and eye exams.  Immunizations.  Screening for certain conditions.  Healthy lifestyle choices, such as eating a healthy diet, getting regular exercise, not using drugs or products that contain nicotine and tobacco, and limiting alcohol use. What can I expect for my preventive care visit? Physical exam Your health care provider will check your:  Height and weight. This may be used to calculate body mass index (BMI), which tells if you are at a healthy weight.  Heart rate and blood pressure.  Skin for abnormal spots. Counseling Your health care provider may ask you questions about your:  Alcohol, tobacco, and drug use.  Emotional well-being.  Home and relationship well-being.  Sexual activity.  Eating habits.  Work and work environment.  Method of birth control.  Menstrual cycle.  Pregnancy history. What immunizations do I need?  Influenza (flu) vaccine  This is recommended every year. Tetanus, diphtheria, and pertussis (Tdap) vaccine  You may need a Td booster every 10 years. Varicella (chickenpox) vaccine  You may need this if you have not been vaccinated. Zoster (shingles) vaccine  You may need this after age 60. Measles, mumps, and rubella (MMR) vaccine  You may need at least one dose of MMR if you were born in 1957 or later. You may also need a second dose. Pneumococcal conjugate (PCV13) vaccine  You may need this if you have certain conditions and were not previously vaccinated. Pneumococcal polysaccharide (PPSV23) vaccine  You may need one or two doses if you smoke cigarettes or if you have certain conditions. Meningococcal conjugate (MenACWY) vaccine  You may need this if you  have certain conditions. Hepatitis A vaccine  You may need this if you have certain conditions or if you travel or work in places where you may be exposed to hepatitis A. Hepatitis B vaccine  You may need this if you have certain conditions or if you travel or work in places where you may be exposed to hepatitis B. Haemophilus influenzae type b (Hib) vaccine  You may need this if you have certain conditions. Human papillomavirus (HPV) vaccine  If recommended by your health care provider, you may need three doses over 6 months. You may receive vaccines as individual doses or as more than one vaccine together in one shot (combination vaccines). Talk with your health care provider about the risks and benefits of combination vaccines. What tests do I need? Blood tests  Lipid and cholesterol levels. These may be checked every 5 years, or more frequently if you are over 50 years old.  Hepatitis C test.  Hepatitis B test. Screening  Lung cancer screening. You may have this screening every year starting at age 55 if you have a 30-pack-year history of smoking and currently smoke or have quit within the past 15 years.  Colorectal cancer screening. All adults should have this screening starting at age 50 and continuing until age 75. Your health care provider may recommend screening at age 45 if you are at increased risk. You will have tests every 1-10 years, depending on your results and the type of screening test.  Diabetes screening. This is done by checking your blood sugar (glucose) after you have not eaten for a while (fasting). You may have this   done every 1-3 years.  Mammogram. This may be done every 1-2 years. Talk with your health care provider about when you should start having regular mammograms. This may depend on whether you have a family history of breast cancer.  BRCA-related cancer screening. This may be done if you have a family history of breast, ovarian, tubal, or peritoneal  cancers.  Pelvic exam and Pap test. This may be done every 3 years starting at age 21. Starting at age 30, this may be done every 5 years if you have a Pap test in combination with an HPV test. Other tests  Sexually transmitted disease (STD) testing.  Bone density scan. This is done to screen for osteoporosis. You may have this scan if you are at high risk for osteoporosis. Follow these instructions at home: Eating and drinking  Eat a diet that includes fresh fruits and vegetables, whole grains, lean protein, and low-fat dairy.  Take vitamin and mineral supplements as recommended by your health care provider.  Do not drink alcohol if: ? Your health care provider tells you not to drink. ? You are pregnant, may be pregnant, or are planning to become pregnant.  If you drink alcohol: ? Limit how much you have to 0-1 drink a day. ? Be aware of how much alcohol is in your drink. In the [place] one drink equals one 12 oz bottle of beer (355 mL), one 5 oz glass of wine (148 mL), or one 1 oz glass of hard liquor (44 mL). Lifestyle  Take daily care of your teeth and gums.  Stay active. Exercise for at least 30 minutes on 5 or more days each week.  Do not use any products that contain nicotine or tobacco, such as cigarettes, e-cigarettes, and chewing tobacco. If you need help quitting, ask your health care provider.  If you are sexually active, practice safe sex. Use a condom or other form of birth control (contraception) in order to prevent pregnancy and STIs (sexually transmitted infections).  If told by your health care provider, take low-dose aspirin daily starting at age 50. What's next?  Visit your health care provider once a year for a well check visit.  Ask your health care provider how often you should have your eyes and teeth checked.  Stay up to date on all vaccines. This information is not intended to replace advice given to you by your health care provider. Make sure you  discuss any questions you have with your health care provider. Document Released: 03/05/2015 Document Revised: 10/18/2017 Document Reviewed: 10/18/2017 Elsevier Patient Education  2020 [employer].   Preventing Osteoporosis, Adult Osteoporosis is a condition that causes the bones to get weaker. With osteoporosis, the bones become thinner, and the normal spaces in bone tissue become larger. This can make the bones weak and cause them to break more easily. People who have osteoporosis are more likely to break their wrist, spine, or hip. Even a minor accident or injury can be enough to break weak bones. Osteoporosis can occur with aging. Your body constantly replaces old bone tissue with new tissue. As you get older, you may lose bone tissue more quickly, or it may be replaced more slowly. Osteoporosis is more likely to develop if you have poor nutrition or do not get enough calcium or vitamin D. Other lifestyle factors can also play a role. By making some diet and lifestyle changes, you can help to keep your bones healthy and help to prevent osteoporosis. What nutrition changes   can be made? Nutrition plays an important role in maintaining healthy, strong bones.  Make sure you get enough calcium every day from food or from calcium supplements. ? If you are age 50 or younger, aim to get 1,000 mg of calcium every day. ? If you are older than age 50, aim to get 1,200 mg of calcium every day.  Try to get enough vitamin D every day. ? If you are age 70 or younger, aim to get 600 international units (IU) every day. ? If you are older than age 70, aim to get 800 international units every day.  Follow a healthy diet. Eat plenty of foods that contain calcium and vitamin D. ? Calcium is in milk, cheese, yogurt, and other dairy products. Some fish and vegetables are also good sources of calcium. Many foods such as cereals and breads have had calcium added to them (are fortified). Check nutrition labels to  see how much calcium is in a food or drink. ? Foods that contain vitamin D include milk, cereals, salmon, and tuna. Your body also makes vitamin D when you are out in the sun. Bare skin exposure to the sun on your face, arms, legs, or back for no more than 30 minutes a day, 2 times per week is more than enough. Beyond that, it is important to use sunblock to protect your skin from sunburn, which increases your risk for skin cancer. What lifestyle changes can be made? Making changes in your everyday life can also play an important role in preventing osteoporosis.  Stay active and get exercise every day. Ask your health care provider what types of exercise are best for you.  Do not use any products that contain nicotine or tobacco, such as cigarettes and e-cigarettes. If you need help quitting, ask your health care provider.  Limit alcohol intake to no more than 1 drink a day for nonpregnant women and 2 drinks a day for men. One drink equals 12 oz of beer, 5 oz of wine, or 1 oz of hard liquor. Why are these changes important? Making these nutrition and lifestyle changes can:  Help you develop and maintain healthy, strong bones.  Prevent loss of bone mass and the problems that are caused by that loss, such as broken bones and delayed healing.  Make you feel better mentally and physically. What can happen if changes are not made? Problems that can result from osteoporosis can be very serious. These may include:  A higher risk of broken bones that are painful and do not heal well.  Physical malformations, such as a collapsed spine or a hunched back.  Problems with movement. Where to find support If you need help making changes to prevent osteoporosis, talk with your health care provider. You can ask for a referral to a diet and nutrition specialist (dietitian) and a physical therapist. Where to find more information Learn more about osteoporosis from:  NIH Osteoporosis and Related Bone  Diseases National Resource Center: www.niams.nih.gov/health_info/bone/osteoporosis/osteoporosis_ff.asp  [place] Office on Women's Health: www.womenshealth.gov/publications/our-publications/fact-sheet/osteoporosis.html  National Osteoporosis Foundation: www.nof.org/patients/what-is-osteoporosis/ Summary  Osteoporosis is a condition that causes weak bones that are more likely to break.  Eating a healthy diet and making sure you get enough calcium and vitamin D can help prevent osteoporosis.  Other ways to reduce your risk of osteoporosis include getting regular exercise and avoiding alcohol and products that contain nicotine or tobacco. This information is not intended to replace advice given to you by your health care provider. Make   sure you discuss any questions you have with your health care provider. Document Released: 02/21/2015 Document Revised: 01/19/2017 Document Reviewed: 10/18/2015 Elsevier Patient Education  2020 [employer].  

## 2019-02-04 NOTE — Progress Notes (Signed)
  Patient: Brenda Ellison, Female    DOB: 05/12/1960, 58 y.o.   MRN: [id number] Tapia, Leisa, PA-C Visit Date: 02/06/2019  Today's Provider: Leisa Tapia, PA-C   Chief Complaint  Patient presents with  . Annual Exam   Subjective:   Annual physical exam:  Brenda Ellison is a 58 y.o. 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 Kowalski   Sleep:  Sleeps well.  She has high cholesterol and prediabetes and wants to recheck labs today.  Not on meds    USPSTF grade A and B recommendations - reviewed and addressed today  Depression:  Phq 9 completed today by patient, was reviewed by me with patient in the room, score is  negative, pt feels mood good PHQ 2/9 Scores 02/04/2019 10/25/2018 04/23/2018 11/06/2017  PHQ - 2 Score 0 0 0 0  PHQ- 9 Score 0 0 0 -   Depression screen PHQ 2/9 02/04/2019 10/25/2018 04/23/2018 11/06/2017 05/03/2017  Decreased Interest 0 0 0 0 0  Down, Depressed, Hopeless 0 0 0 0 0  PHQ - 2 Score 0 0 0 0 0  Altered sleeping 0 0 0 - -  Tired, decreased energy 0 0 0 - -  Change in appetite 0 0 0 - -  Feeling bad or failure about yourself  0 0 0 - -  Trouble concentrating 0 0 0 - -  Moving slowly or fidgety/restless 0 0 0 - -  Suicidal thoughts 0 0 0 - -  PHQ-9 Score 0 0 0 - -  Difficult doing work/chores Not difficult at all Not difficult at all Not difficult at all - -    Alcohol screening:   Office  Visit from 02/04/2019 in [facility]  AUDIT-C Score  0      Immunizations and Health Maintenance: Health Maintenance  Topic Date Due  . MAMMOGRAM  06/02/2018  . PAP SMEAR-Modifier  03/29/2019  . TETANUS/TDAP  04/23/2019 (Originally 04/28/1981)  . COLONOSCOPY  05/16/2027  . INFLUENZA VACCINE  Completed  . Hepatitis C Screening  Completed  . HIV Screening  Completed     Hep C Screening: done  STD testing and prevention (HIV/chl/gon/syphilis): done  Intimate partner violence:  Feels safe  Sexual History/Pain during Intercourse:  N/A In relationship, marriage pushed back due to COVID  Menstrual History/LMP/Abnormal Bleeding: none, menopause in 40's No LMP recorded. Patient is postmenopausal.  Incontinence Symptoms:  A little, urge and stress   Breast cancer:  Last Mammogram:  05/2017 BRCA gene screening: n/a  Cervical cancer screening: UTD Family hx of cancers - breast, ovarian, uterine, colon:   Pt denies any family hx  Osteoporosis:   Discussed high calcium and vitamin D supplementation, weight bearing exercises   Pt is not supplementing with daily calcium/Vit D. Bone scan/dexa  Skin cancer:  Hx of skin CA -  NO Discussed atypical lesions   Colorectal cancer:   colonoscopy is utd  Lung cancer:   Low Dose CT Chest recommended if Age 55-80 years, 30 pack-year currently smoking OR have quit w/in 15years. Patient does not qualify.   Past smoker from 1990 to about 8 years ago about 1ppd, and lifetime of second hand smoke  Social History   Tobacco Use  . Smoking status: Former Smoker    Packs/day: 1.00    Years: 22.00    Pack years: 22.00    Types: Cigarettes    Start date: 02/21/1988    Quit date: 02/20/2010    Years since quitting: 8.9  . Smokeless tobacco: Never Used  Substance Use Topics  . Alcohol use: No     ECG:  Per cardiology  Blood pressure/Hypertension: BP Readings from Last 3 Encounters:  02/04/19 118/80  10/25/18 112/66   04/23/18 126/80    Weight/Obesity: Wt Readings from Last 3 Encounters:  02/04/19 179 lb 11.2 oz (81.5 kg)  10/25/18 173 lb 14.4 oz (78.9 kg)  04/23/18 167 lb 4.8 oz (75.9 kg)   BMI Readings from Last 3 Encounters:  02/04/19 30.85 kg/m  10/25/18 29.85 kg/m  04/23/18 29.64 kg/m     Lipids:  Lab Results  Component Value Date   CHOL 239 (H) 02/04/2019   CHOL 225 (H) 04/23/2018   CHOL 245 (H) 11/06/2017   Lab Results  Component Value Date   HDL 60 02/04/2019   HDL 61 04/23/2018   HDL 67 11/06/2017   Lab Results  Component Value Date   LDLCALC 152 (H) 02/04/2019   LDLCALC 136 (H) 04/23/2018   LDLCALC 153 (H) 11/06/2017   Lab Results  Component Value Date   TRIG 148 02/04/2019   TRIG 149 04/23/2018   TRIG 123 11/06/2017   Lab Results  Component Value Date   CHOLHDL 4.0 02/04/2019   CHOLHDL 3.7 04/23/2018   CHOLHDL 3.7 11/06/2017   No results found for: LDLDIRECT Based on the results of lipid panel his/her cardiovascular risk factor ( using [place] Cohort )  in the next 10 years is: The 10-year ASCVD risk score (Goff DC Jr., et al., 2013) is: 3%   Values used to calculate the score:     Age: 58 years     Sex: Female     Is Non-Hispanic African American: Yes     Diabetic: No     Tobacco smoker: No     Systolic Blood Pressure: 118 mmHg     Is BP treated: No     HDL Cholesterol: 60 mg/dL     Total Cholesterol: 239 mg/dL  Glucose:  Glucose  Date Value Ref Range Status  01/20/2013 86 65 - 99 mg/dL Final   Glucose, Bld  Date Value Ref Range Status  02/04/2019 89 65 - 99 mg/dL Final    Comment:    .            Fasting reference interval .   04/23/2018 124 (H) 65 - 99 mg/dL Final    Comment:    .            Fasting reference interval . For someone without known diabetes, a glucose value between 100 and 125 mg/dL is consistent with prediabetes and should be confirmed with a follow-up test. .   11/06/2017 88 65 - 139 mg/dL Final      Comment:    .         Non-fasting reference interval .       Office Visit from 02/04/2019 in [facility]  AUDIT-C Score  0     Depression: Phq 9 is  positive Depression screen PHQ 2/9 02/04/2019 10/25/2018 04/23/2018 11/06/2017 05/03/2017  Decreased Interest 0 0 0 0 0  Down, Depressed, Hopeless 0 0 0 0 0  PHQ - 2 Score 0 0 0 0 0  Altered sleeping 0 0 0 - -  Tired, decreased energy 0 0 0 - -  Change in appetite 0 0 0 - -  Feeling bad or failure about yourself  0 0 0 - -  Trouble concentrating 0 0 0 - -  Moving slowly or fidgety/restless 0 0 0 - -  Suicidal thoughts 0 0 0 - -  PHQ-9 Score 0 0 0 - -  Difficult doing work/chores Not difficult at all Not difficult at all Not difficult at all - -   Hypertension: BP Readings from Last 3 Encounters:  02/04/19 118/80  10/25/18 112/66  04/23/18 126/80   Obesity: Wt Readings from Last 3 Encounters:  02/04/19 179 lb 11.2 oz (81.5 kg)  10/25/18 173 lb 14.4 oz (78.9 kg)  04/23/18 167 lb 4.8 oz (75.9 kg)   BMI Readings from Last 3 Encounters:  02/04/19 30.85 kg/m  10/25/18 29.85 kg/m  04/23/18 29.64 kg/m     Social History      She  reports that she quit smoking about 8 years ago. Her smoking use included cigarettes. She started smoking about 30 years ago. She has a 22.00 pack-year smoking history. She has never used smokeless tobacco. She reports that she does not drink alcohol or use drugs.       Social History   Socioeconomic History  . Marital status: Single    Spouse name: Not on file  . Number of children: Not on file  . Years of education: Not on file  . Highest education level: Not on file  Occupational History  . Not on file  Tobacco Use  . Smoking status: Former Smoker    Packs/day: 1.00    Years: 22.00    Pack years: 22.00    Types: Cigarettes    Start date: 02/21/1988    Quit date: 02/20/2010    Years since quitting: 8.9  . Smokeless tobacco: Never Used  Substance and Sexual Activity  . Alcohol use: No  .  Drug use: No  . Sexual activity: Not Currently    Birth control/protection: Post-menopausal  Other Topics Concern  . Not on file  Social History Narrative   Widowed   Works full time   Carbonated beverages   Social Determinants of Health   Financial Resource Strain:   . Difficulty of Paying Living Expenses: Not on file  Food Insecurity:   . Worried About Running Out of Food in the Last Year: Not on file  . Ran Out of Food in the Last Year: Not on file  Transportation Needs:   . Lack of Transportation (Medical): Not on file  . Lack of Transportation (Non-Medical): Not on file  Physical Activity:   . Days of Exercise per Week: Not on file  . Minutes of Exercise per Session: Not on file  Stress:   . Feeling of Stress : Not on file  Social Connections:   . Frequency of Communication with Friends and Family: Not on file  . Frequency of   Social Gatherings with Friends and Family: Not on file  . Attends Religious Services: Not on file  . Active Member of Clubs or Organizations: Not on file  . Attends Club or Organization Meetings: Not on file  . Marital Status: Not on file    Family History        Family Status  Relation Name Status  . Brother  Alive  . Mother  Deceased       heart attack  . Son  Alive  . MGM  Deceased  . MGF  Deceased  . PGM  Deceased  . PGF  Deceased  . Son  Alive  . Son  Alive  . Neg Hx  (Not Specified)        Her family history includes Diabetes in her brother; Heart attack in her mother. There is no history of Breast cancer, Ovarian cancer, Colon cancer, or Heart disease.       Family History  Problem Relation Age of Onset  . Diabetes Brother   . Heart attack Mother   . Breast cancer Neg Hx   . Ovarian cancer Neg Hx   . Colon cancer Neg Hx   . Heart disease Neg Hx     Patient Active Problem List   Diagnosis Date Noted  . Cervical radiculopathy 10/25/2018  . Stable angina pectoris (HCC) 09/30/2018  . Overweight (BMI 25.0-29.9) 04/23/2018   . DDD (degenerative disc disease), lumbar 11/06/2017  . Cardiac murmur 03/16/2017  . Lumbar radiculopathy 03/16/2017  . Prediabetes 07/12/2016  . Osteoarthritis 06/07/2015  . Mixed hyperlipidemia 06/01/2015  . Degenerative arthritis of hip 10/19/2014  . Arthritis of hand, degenerative 10/05/2014  . 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 Moderate mitral insufficiency 05/01/2014    Past Surgical History:  Procedure Laterality Date  . COLONOSCOPY WITH PROPOFOL N/A 05/15/2017   Procedure: COLONOSCOPY WITH PROPOFOL;  Surgeon: Anna, Kiran, MD;  Location: [facility] ENDOSCOPY;  Service: Gastroenterology;  Laterality: N/A;  . HAND RECONSTRUCTION Bilateral    as a child     Current Outpatient Medications:  .  acetaminophen (TYLENOL) 325 MG tablet, Take 1-2 tablets (325-650 mg total) by mouth every 6 (six) hours as needed. Maximum dose is 3,000 mg per 24 hours, Disp: , Rfl:  .  atorvastatin (LIPITOR) 20 MG tablet, Take 1 tablet (20 mg total) by mouth at bedtime., Disp: 90 tablet, Rfl: 3 .  baclofen (LIORESAL) 10 MG tablet, TAKE 1 TABLET BY MOUTH THREE TIMES DAILY AS NEEDED FOR MUSCLE SPASM OR PAIN, Disp: , Rfl:  .  Cholecalciferol (VITAMIN D3) 2000 UNITS capsule, Take 2,000 Units by mouth daily. , Disp: , Rfl:  .  meloxicam (MOBIC) 15 MG tablet, Take 1 tab PO daily x 2 weeks, then daily as needed for pain., Disp: , Rfl:   Allergies  Allergen Reactions  . Tramadol Rash    Patient Care Team: Tapia, Leisa, PA-C as PCP - General (Family Medicine) Lambert, Sheena M, RN as Registered Nurse Shaver, Anne F, RN as Registered Nurse Ingraham, Michael J, MD as Referring Physician (Physical Medicine and Rehabilitation) Kowalski, Bruce J, MD as Consulting Physician (Cardiology)  Review of Systems  Constitutional: Negative.  Negative for activity change, appetite change, fatigue and unexpected weight change.  HENT: Negative.   Eyes: Negative.   Respiratory: Positive for chest tightness and shortness of breath.  Negative for cough and wheezing.   Cardiovascular: Negative.  Negative for chest pain, palpitations and leg swelling.  Gastrointestinal: Negative.  Negative for abdominal pain   and blood in stool.  Endocrine: Negative.   Genitourinary: Negative.   Musculoskeletal: Negative.  Negative for arthralgias, gait problem, joint swelling and myalgias.  Skin: Negative.  Negative for color change, pallor and rash.  Allergic/Immunologic: Negative.   Neurological: Negative.  Negative for syncope and weakness.  Hematological: Negative.   Psychiatric/Behavioral: Negative.  Negative for confusion, dysphoric mood, self-injury and suicidal ideas. The patient is not nervous/anxious.   All other systems reviewed and are negative.            Objective:   Vitals:  Vitals:   02/04/19 1441  BP: 118/80  Pulse: 85  Resp: 14  Temp: 97.6 F (36.4 C)  SpO2: 95%  Weight: 179 lb 11.2 oz (81.5 kg)  Height: 5' 4" (1.626 m)    Body mass index is 30.85 kg/m.  Physical Exam Vitals and nursing note reviewed.  Constitutional:      General: She is not in acute distress.    Appearance: Normal appearance. She is well-developed. She is obese. She is not ill-appearing, toxic-appearing or diaphoretic.     Interventions: Face mask in place.  HENT:     Head: Normocephalic and atraumatic.     Right Ear: External ear normal.     Left Ear: External ear normal.  Eyes:     General: Lids are normal. No scleral icterus.       Right eye: No discharge.        Left eye: No discharge.     Conjunctiva/sclera: Conjunctivae normal.  Neck:     Trachea: Phonation normal. No tracheal deviation.  Cardiovascular:     Rate and Rhythm: Normal rate and regular rhythm.     Pulses: Normal pulses.          Radial pulses are 2+ on the right side and 2+ on the left side.       Posterior tibial pulses are 2+ on the right side and 2+ on the left side.     Heart sounds: Murmur present. No friction rub. No gallop.   Pulmonary:      Effort: Pulmonary effort is normal. No respiratory distress.     Breath sounds: Normal breath sounds. No stridor. No wheezing, rhonchi or rales.  Chest:     Chest wall: No tenderness.  Abdominal:     General: Bowel sounds are normal. There is no distension.     Palpations: Abdomen is soft.     Tenderness: There is no abdominal tenderness. There is no guarding or rebound.  Musculoskeletal:        General: No deformity. Normal range of motion.     Cervical back: Normal range of motion and neck supple.     Right lower leg: No edema.     Left lower leg: No edema.  Lymphadenopathy:     Cervical: No cervical adenopathy.  Skin:    General: Skin is warm and dry.     Capillary Refill: Capillary refill takes less than 2 seconds.     Coloration: Skin is not jaundiced or pale.     Findings: No rash.  Neurological:     Mental Status: She is alert and oriented to person, place, and time.     Motor: No abnormal muscle tone.     Gait: Gait normal.  Psychiatric:        Speech: Speech normal.        Behavior: Behavior normal.       Fall Risk: Fall Risk  02/04/2019 10/25/2018  04/23/2018 11/06/2017 05/03/2017  Falls in the past year? 0 0 0 No No  Number falls in past yr: 0 0 0 - -  Injury with Fall? 0 0 0 - -    Functional Status Survey: Is the patient deaf or have difficulty hearing?: No Does the patient have difficulty seeing, even when wearing glasses/contacts?: No Does the patient have difficulty concentrating, remembering, or making decisions?: No Does the patient have difficulty walking or climbing stairs?: No Does the patient have difficulty dressing or bathing?: No Does the patient have difficulty doing errands alone such as visiting a doctor's office or shopping?: No   Assessment & Plan:    CPE completed today  . USPSTF grade A and B recommendations reviewed with patient; age-appropriate recommendations, preventive care, screening tests, etc discussed and encouraged; healthy living  encouraged; see AVS for patient education given to patient  . Discussed importance of 150 minutes of physical activity weekly, AHA exercise recommendations given to pt in AVS/handout  . Discussed importance of healthy diet:  eating lean meats and proteins, avoiding trans fats and saturated fats, avoid simple sugars and excessive carbs in diet, eat 6 servings of fruit/vegetables daily and drink plenty of water and avoid sweet beverages.    . Recommended pt to do annual eye exam and routine dental exams/cleanings  . Depression, alcohol, fall screening completed as documented above and per flowsheets  . Reviewed Health Maintenance: Health Maintenance  Topic Date Due  . MAMMOGRAM  06/02/2018  . PAP SMEAR-Modifier  03/29/2019  . TETANUS/TDAP  04/23/2019 (Originally 04/28/1981)  . COLONOSCOPY  05/16/2027  . INFLUENZA VACCINE  Completed  . Hepatitis C Screening  Completed  . HIV Screening  Completed    . Immunizations: Immunization History  Administered Date(s) Administered  . Influenza,inj,Quad PF,6+ Mos 11/06/2017  . Influenza-Unspecified 12/22/2018      Orders Placed This Encounter  Procedures  . DG Bone Density    Standing Status:   Future    Standing Expiration Date:   04/06/2020    Order Specific Question:   Reason for Exam (SYMPTOM  OR DIAGNOSIS REQUIRED)    Answer:   estrogen deficiency postmenopause, vit d deficiency, fall risk with DDD and spinal stenosis    Order Specific Question:   Is the patient pregnant?    Answer:   No    Order Specific Question:   Preferred imaging location?    Answer:   [facility]  . CBC w/ Diff  . CMP w GFR  . Lipid Panel    Order Specific Question:   Has the patient fasted?    Answer:   Yes  . A1C    1. Adult general medical exam done - DG Bone Density; Future - CBC w/ Diff - CMP w GFR - Lipid Panel - A1C  2. Screening for lipoid disorders - CMP w GFR - Lipid Panel  3. Screening for endocrine, metabolic and immunity  disorder - CBC w/ Diff - CMP w GFR - Lipid Panel  4. Screening for deficiency anemia - CBC w/ Diff  5. Prediabetes - CMP w GFR - A1C  6. Postmenopausal estrogen deficiency - DG Bone Density; Future  7. Vitamin D deficiency, unspecified - DG Bone Density; Future      Delsa Grana, PA-C 02/04/19 2:53 PM  [facility]

## 2019-02-05 LAB — CBC WITH DIFFERENTIAL/PLATELET
Absolute Monocytes: 562 cells/uL (ref 200–950)
Basophils Absolute: 50 cells/uL (ref 0–200)
Basophils Relative: 0.7 %
Eosinophils Absolute: 209 cells/uL (ref 15–500)
Eosinophils Relative: 2.9 %
HCT: 38.2 % (ref 35.0–45.0)
Hemoglobin: 12.9 g/dL (ref 11.7–15.5)
Lymphs Abs: 3686 cells/uL (ref 850–3900)
MCH: 29.8 pg (ref 27.0–33.0)
MCHC: 33.8 g/dL (ref 32.0–36.0)
MCV: 88.2 fL (ref 80.0–100.0)
MPV: 11.2 fL (ref 7.5–12.5)
Monocytes Relative: 7.8 %
Neutro Abs: 2693 cells/uL (ref 1500–7800)
Neutrophils Relative %: 37.4 %
Platelets: 245 10*3/uL (ref 140–400)
RBC: 4.33 10*6/uL (ref 3.80–5.10)
RDW: 12.9 % (ref 11.0–15.0)
Total Lymphocyte: 51.2 %
WBC: 7.2 10*3/uL (ref 3.8–10.8)

## 2019-02-05 LAB — COMPLETE METABOLIC PANEL WITH GFR
AG Ratio: 1.6 (calc) (ref 1.0–2.5)
ALT: 22 U/L (ref 6–29)
AST: 20 U/L (ref 10–35)
Albumin: 4.6 g/dL (ref 3.6–5.1)
Alkaline phosphatase (APISO): 111 U/L (ref 37–153)
BUN: 17 mg/dL (ref 7–25)
CO2: 27 mmol/L (ref 20–32)
Calcium: 10.3 mg/dL (ref 8.6–10.4)
Chloride: 105 mmol/L (ref 98–110)
Creat: 0.91 mg/dL (ref 0.50–1.05)
GFR, Est African American: 82 mL/min/{1.73_m2} (ref >=60)
GFR, Est Non African American: 71 mL/min/{1.73_m2} (ref >=60)
Globulin: 2.9 g/dL (calc) (ref 1.9–3.7)
Glucose, Bld: 89 mg/dL (ref 65–99)
Potassium: 4.1 mmol/L (ref 3.5–5.3)
Sodium: 141 mmol/L (ref 135–146)
Total Bilirubin: 0.6 mg/dL (ref 0.2–1.2)
Total Protein: 7.5 g/dL (ref 6.1–8.1)

## 2019-02-05 LAB — HEMOGLOBIN A1C
Hgb A1c MFr Bld: 6.3 % of total Hgb — ABNORMAL HIGH (ref <5.7)
Mean Plasma Glucose: 134 (calc)
eAG (mmol/L): 7.4 (calc)

## 2019-02-05 LAB — LIPID PANEL
Cholesterol: 239 mg/dL — ABNORMAL HIGH (ref <200)
HDL: 60 mg/dL (ref >=50)
LDL Cholesterol (Calc): 152 mg/dL (calc) — ABNORMAL HIGH
Non-HDL Cholesterol (Calc): 179 mg/dL (calc) — ABNORMAL HIGH (ref <130)
Total CHOL/HDL Ratio: 4 (calc) (ref <5.0)
Triglycerides: 148 mg/dL (ref <150)

## 2019-02-06 MED ORDER — ATORVASTATIN CALCIUM 20 MG PO TABS: 20.0000 mg | ORAL_TABLET | Freq: Every day | ORAL | 3 refills | Status: DC

## 2019-02-18 ENCOUNTER — Other Ambulatory Visit: Payer: Self-pay | Admitting: Family Medicine

## 2019-02-19 ENCOUNTER — Other Ambulatory Visit: Payer: Self-pay | Admitting: Family Medicine

## 2019-02-19 DIAGNOSIS — Z1231 Encounter for screening mammogram for malignant neoplasm of breast: Secondary | ICD-10-CM

## 2019-03-20 ENCOUNTER — Ambulatory Visit: Payer: BLUE CROSS/BLUE SHIELD | Admitting: Podiatry

## 2019-03-20 ENCOUNTER — Other Ambulatory Visit: Payer: Self-pay

## 2019-03-20 ENCOUNTER — Ambulatory Visit
Admission: RE | Admit: 2019-03-20 | Discharge: 2019-03-20 | Disposition: A | Discharge status: Home / Self Care | Payer: BLUE CROSS/BLUE SHIELD | Source: Ambulatory Visit | Attending: Family Medicine | Admitting: Family Medicine

## 2019-03-20 DIAGNOSIS — Z1231 Encounter for screening mammogram for malignant neoplasm of breast: Secondary | ICD-10-CM | POA: Diagnosis not present

## 2019-04-01 ENCOUNTER — Other Ambulatory Visit: Payer: Self-pay

## 2019-04-01 ENCOUNTER — Encounter: Payer: Self-pay | Admitting: Family Medicine

## 2019-04-01 ENCOUNTER — Ambulatory Visit (INDEPENDENT_AMBULATORY_CARE_PROVIDER_SITE_OTHER): Payer: BLUE CROSS/BLUE SHIELD | Admitting: Family Medicine

## 2019-04-01 VITALS — BP 110/82 | HR 84 | Temp 97.9°F | Resp 14 | Ht 64.0 in | Wt 180.7 lb

## 2019-04-01 DIAGNOSIS — J984 Other disorders of lung: Secondary | ICD-10-CM

## 2019-04-01 DIAGNOSIS — R0602 Shortness of breath: Secondary | ICD-10-CM

## 2019-04-01 DIAGNOSIS — R590 Localized enlarged lymph nodes: Secondary | ICD-10-CM | POA: Diagnosis not present

## 2019-04-01 DIAGNOSIS — R911 Solitary pulmonary nodule: Secondary | ICD-10-CM | POA: Diagnosis not present

## 2019-04-01 DIAGNOSIS — R062 Wheezing: Secondary | ICD-10-CM | POA: Diagnosis not present

## 2019-04-01 MED ORDER — ALBUTEROL SULFATE HFA 108 (90 BASE) MCG/ACT IN AERS: 2.0000 | INHALATION_SPRAY | RESPIRATORY_TRACT | 1 refills | Status: DC | PRN

## 2019-04-01 MED ORDER — PREDNISONE 20 MG PO TABS: ORAL_TABLET | ORAL | 0 refills | Status: DC

## 2019-04-01 NOTE — Progress Notes (Signed)
Patient ID: Brenda Ellison, female    DOB: September 03, 1960, 59 y.o.   MRN: [id number]  PCP: Danelle Berry, PA-C  Chief Complaint  Patient presents with  . Follow-up    CT scan ( in care everywhere) [facility]    Subjective:   Brenda Ellison is a 59 y.o. female, presents to clinic with CC of the following:  HPI  Patient presents for follow-up on CT scan results which were ordered by a physical medicine provider and [facility] and they were encouraged to come here for follow-up on the results. Patient has been going to physical medicine and rehab for chronic neck and back pain when last month she got a cervical MRI which showed incomplete visualization of abnormal soft tissue and upper chest and a follow-up scan was recommended. CT chest with contrast was done at [facility] which showed mildly enlarged mediastinal and bilateral hilar lymphadenopathy, scattered partial calcified right lung nodules and indeterminate right millimeter right lower lobe groundglass opacity was suggested in 2 to 64-month follow-up and differential including neoplastic and reactive etiologies.  Examination of findings in more detail note mild diffuse bronchial wall thickening and occasional mucus impaction which states it may be secondary to chronic small airway disease.  Results as follows.  Printed and reviewed with pt today  CT Chest W Contrast1/01/2020 [facility] Result Impression   Mildly enlarged mediastinal and bilateral hilar lymphadenopathy; differential considerations include neoplastic (metastasis) and reactive (granulomatous disease) etiology. Recommend comparison with prior CT if available. Close follow-up CT chest can be obtained in 2-3 months for reassessment.  Scattered partially calcified right lung nodules and indeterminate 4 mm right lower lobe groundglass opacity. Attention on the follow-up is suggested.  Indeterminate 3.3 cm left adrenal nodule, may represent adenoma. Recommend  further evaluation with adrenal protocol CT/MRI.  Aneurysmal dilatation of ascending aorta, measuring 4.5 cm.  Result Narrative  EXAM: CT CHEST W CONTRAST DATE: 03/04/2019 3:31 PM ACCESSION: [id number] UN DICTATED: 03/04/2019 4:03 PM INTERPRETATION LOCATION: [facility]  CLINICAL INDICATION: 59 years old Female with Mass/adenopathy seen below carina on right side. ; Lymphadenopathy, chest or axilla - R59.1-Adenopathy   COMPARISON: None  TECHNIQUE: A helical CT scan was obtained with IV contrast from the thoracic inlet through the hemidiaphragms. Images were reconstructed in the axial plane. Coronal and sagittal reformatted images of the chest were also provided for further evaluation of the lung parenchyma.  FINDINGS:   AIRWAYS, LUNGS, PLEURA: Clear central tracheobronchial tree. No lung consolidation. Scattered partially calcified 2 to 3 mm micronodules in the right lung, for reference in the right lower lobe at series 6 image 189. 4 mm groundglass nodule in the right lower lobe (series 6 image 221), likely post inflammatory. No pleural effusion. Mild diffuse bronchial wall thickening and occasional mucus impaction. Mild mosaic lung parenchymal attenuation likely reflects air trapping secondary to chronic small airways disease.  MEDIASTINUM: Normal heart size. No pericardial effusion. Aneurysmal dilatation of ascending aorta, measuring 4.5 cm. Enlarged 1.5 cm right paratracheal lymph node (series 2 image 26). Additional prominent paratracheal, AP window, subcarinal (1.9 cm) lymph nodes. Enlarged bilateral hilar lymphadenopathy, measuring 1.7 cm on the right. Subtle calcification in the left infrahilar hilar lymph node.  IMAGED ABDOMEN: 3.3 cm left adrenal nodule.  SOFT TISSUES: Unremarkable.  BONES: No suspicious osseous lesion.   Pt reports exertional SOB "can't walk far" and is "short winded" - has been getting gradually worse for the past several years.  She has difficulty  giving exact time line.  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 No weight loss, no night sweats (but has normal menopause hot flashes) no chronic cough, no hemptysis, she had bronchitis once as a child and denies any hx of lung disease or ashtma She has past smoking hx ("along time ago when I was young" 1995-2006 or so only about 1/2 ppd 1995 was the first time she went to jail in life -she states she was in jail very briefly and was never in prison, denies any past infections, pneumonia or bronchitis.  She denies recalling using any inhalers.  Wt Readings from Last 5 Encounters:  04/01/19 180 lb 11.2 oz (82 kg)  02/04/19 179 lb 11.2 oz (81.5 kg)  10/25/18 173 lb 14.4 oz (78.9 kg)  04/23/18 167 lb 4.8 oz (75.9 kg)  11/06/17 161 lb 11.2 oz (73.3 kg)    No rashes Various areas of joint pain, swelling and stiffness and arthritis  Reviewed patient's past several years of imaging done in [place] and through care everywhere -no similar findings on any images  Reviewed her most recent lab work which showed normal hemoglobin hematocrit, cell indices, white blood cells and differential  Most recent imaging reviewed today 2018 CXR showed moderate scoliosis and no active cardiopulmonary disease 09/2016 lumbar spine multilevel degenerative disc disease lumbar spine  04/16/2018 MRI lumbar spine   She sees Dr. Gwen Pounds 09/2018 for EKG and stress test ECHO 2015 INTERPRETATION MODERATE LV SYSTOLIC DYSFUNCTION (See above) NORMAL RIGHT VENTRICULAR SYSTOLIC FUNCTION NO VALVULAR REGURGITATION NO VALVULAR STENOSIS  Patient Active Problem List   Diagnosis Date Noted  . Cervical radiculopathy 10/25/2018  . Stable angina pectoris (HCC) 09/30/2018  . Overweight (BMI 25.0-29.9) 04/23/2018  . DDD (degenerative disc disease),  lumbar 11/06/2017  . Cardiac murmur 03/16/2017  . Lumbar radiculopathy 03/16/2017  . Prediabetes 07/12/2016  . Osteoarthritis 06/07/2015  . Mixed hyperlipidemia 06/01/2015  . Degenerative arthritis of hip 10/19/2014  . Arthritis of hand, degenerative 10/05/2014  . 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 Moderate mitral insufficiency 05/01/2014      Current Outpatient Medications:  .  atorvastatin (LIPITOR) 20 MG tablet, Take 1 tablet (20 mg total) by mouth at bedtime., Disp: 90 tablet, Rfl: 3 .  Cholecalciferol (VITAMIN D3) 2000 UNITS capsule, Take 2,000 Units by mouth daily. , Disp: , Rfl:  .  meloxicam (MOBIC) 15 MG tablet, Take 1 tab PO daily x 2 weeks, then daily as needed for pain., Disp: , Rfl:    Allergies  Allergen Reactions  . Tramadol Rash     Family History  Problem Relation Age of Onset  . Diabetes Brother   . Heart attack Mother   . Breast cancer Neg Hx   . Ovarian cancer Neg Hx   . Colon cancer Neg Hx   . Heart disease Neg Hx      Social History   Socioeconomic History  . Marital status: Single    Spouse name: Not on file  . Number of children: Not on file  . Years of education: Not on file  . Highest education level: Not on file  Occupational History  . Not on file  Tobacco Use  . Smoking status: Former Smoker    Packs/day: 1.00    Years: 22.00    Pack years: 22.00  Types: Cigarettes    Start date: 02/21/1988    Quit date: 02/20/2010    Years since quitting: 9.1  . Smokeless tobacco: Never Used  Substance and Sexual Activity  . Alcohol use: No  . Drug use: No  . Sexual activity: Not Currently    Birth control/protection: Post-menopausal  Other Topics Concern  . Not on file  Social History Narrative   Widowed   Works full time   Carbonated beverages   Social Determinants of Health   Financial Resource Strain:   . Difficulty of Paying Living Expenses: Not on file  Food Insecurity:   . Worried About Charity fundraiser in the Last Year: Not on  file  . Ran Out of Food in the Last Year: Not on file  Transportation Needs:   . Lack of Transportation (Medical): Not on file  . Lack of Transportation (Non-Medical): Not on file  Physical Activity:   . Days of Exercise per Week: Not on file  . Minutes of Exercise per Session: Not on file  Stress:   . Feeling of Stress : Not on file  Social Connections:   . Frequency of Communication with Friends and Family: Not on file  . Frequency of Social Gatherings with Friends and Family: Not on file  . Attends Religious Services: Not on file  . Active Member of Clubs or Organizations: Not on file  . Attends Archivist Meetings: Not on file  . Marital Status: Not on file  Intimate Partner Violence:   . Fear of Current or Ex-Partner: Not on file  . Emotionally Abused: Not on file  . Physically Abused: Not on file  . Sexually Abused: Not on file    Chart Review Today: I personally reviewed active problem list, medication list, allergies, family history, social history, health maintenance, notes from last encounter, lab results, imaging with the patient/caregiver today. Extensive review of multiple past images, recent imaging, lab work, specialist visits    Review of Systems  Constitutional: Negative.  Negative for activity change, appetite change, chills, diaphoresis, fatigue, fever and unexpected weight change.  HENT: Negative.  Negative for congestion.   Eyes: Negative.   Respiratory: Positive for shortness of breath and wheezing. Negative for apnea, cough, choking, chest tightness and stridor.   Cardiovascular: Negative.  Negative for chest pain, palpitations and leg swelling.  Gastrointestinal: Negative.   Endocrine: Negative.  Negative for cold intolerance, heat intolerance, polydipsia, polyphagia and polyuria.  Genitourinary: Negative.   Musculoskeletal: Positive for back pain (chronic neck and back pain) and neck pain.  Skin: Negative.  Negative for color change, pallor  and rash.  Allergic/Immunologic: Negative.   Neurological: Negative.  Negative for dizziness, light-headedness and headaches.  Hematological: Negative.  Negative for adenopathy. Does not bruise/bleed easily.  Psychiatric/Behavioral: Negative.   All other systems reviewed and are negative.      Objective:   Vitals:   04/01/19 1429  BP: 110/82  Pulse: 84  Resp: 14  Temp: 97.9 F (36.6 C)  SpO2: 95%  Weight: 180 lb 11.2 oz (82 kg)  Height: 5\' 4"  (1.626 m)    Body mass index is 31.02 kg/m.  Physical Exam Vitals and nursing note reviewed.  Constitutional:      General: She is not in acute distress.    Appearance: Normal appearance. She is well-developed. She is obese. She is not ill-appearing, toxic-appearing or diaphoretic.     Comments: Central obesity   HENT:     Head: Normocephalic  and atraumatic.     Right Ear: External ear normal.     Left Ear: External ear normal.  Eyes:     General:        Right eye: No discharge.        Left eye: No discharge.     Conjunctiva/sclera: Conjunctivae normal.  Neck:     Trachea: No tracheal deviation.  Cardiovascular:     Rate and Rhythm: Normal rate.  Pulmonary:     Effort: Pulmonary effort is normal. No respiratory distress.     Breath sounds: Decreased air movement present. No stridor or transmitted upper airway sounds.     Comments: Increased AP diameter, distant breath sounds, no wheezes rales or rhonchi with anterior and posterior auscultation No retractions, accessory muscle use, tachypnea  Abdominal:     Comments: Soft obese abdomen, nondistended, nontender normal bowel sounds x4  Musculoskeletal:     Right lower leg: No edema.     Left lower leg: No edema.  Lymphadenopathy:     Upper Body:     Right upper body: No supraclavicular adenopathy.     Left upper body: Supraclavicular adenopathy present.  Skin:    General: Skin is warm and dry.     Capillary Refill: Capillary refill takes less than 2 seconds.      Coloration: Skin is not jaundiced or pale.     Findings: No rash.  Neurological:     Mental Status: She is alert. Mental status is at baseline.  Psychiatric:        Mood and Affect: Mood normal.        Behavior: Behavior normal.      Ambulatory pulse oximetry done before patient completed visit today, on room air at rest respirations were 14, heart rate 84 and SPO2 was 95%, after walking 3 laps around the clinic at a brisk pace room air pulse ox was 92%  Results for orders placed or performed in visit on 02/04/19  CBC w/ Diff  Result Value Ref Range   WBC 7.2 3.8 - 10.8 Thousand/uL   RBC 4.33 3.80 - 5.10 Million/uL   Hemoglobin 12.9 11.7 - 15.5 g/dL   HCT 37.1 69.6 - 78.9 %   MCV 88.2 80.0 - 100.0 fL   MCH 29.8 27.0 - 33.0 pg   MCHC 33.8 32.0 - 36.0 g/dL   RDW 38.1 01.7 - 51.0 %   Platelets 245 140 - 400 Thousand/uL   MPV 11.2 7.5 - 12.5 fL   Neutro Abs 2,693 1,500 - 7,800 cells/uL   Lymphs Abs 3,686 850 - 3,900 cells/uL   Absolute Monocytes 562 200 - 950 cells/uL   Eosinophils Absolute 209 15 - 500 cells/uL   Basophils Absolute 50 0 - 200 cells/uL   Neutrophils Relative % 37.4 %   Total Lymphocyte 51.2 %   Monocytes Relative 7.8 %   Eosinophils Relative 2.9 %   Basophils Relative 0.7 %  CMP w GFR  Result Value Ref Range   Glucose, Bld 89 65 - 99 mg/dL   BUN 17 7 - 25 mg/dL   Creat 2.58 5.27 - 7.82 mg/dL   GFR, Est Non African American 71 > OR = 60 mL/min/1.69m2   GFR, Est African American 82 > OR = 60 mL/min/1.64m2   BUN/Creatinine Ratio NOT APPLICABLE 6 - 22 (calc)   Sodium 141 135 - 146 mmol/L   Potassium 4.1 3.5 - 5.3 mmol/L   Chloride 105 98 - 110 mmol/L   CO2  27 20 - 32 mmol/L   Calcium 10.3 8.6 - 10.4 mg/dL   Total Protein 7.5 6.1 - 8.1 g/dL   Albumin 4.6 3.6 - 5.1 g/dL   Globulin 2.9 1.9 - 3.7 g/dL (calc)   AG Ratio 1.6 1.0 - 2.5 (calc)   Total Bilirubin 0.6 0.2 - 1.2 mg/dL   Alkaline phosphatase (APISO) 111 37 - 153 U/L   AST 20 10 - 35 U/L   ALT 22 6 -  29 U/L  Lipid Panel  Result Value Ref Range   Cholesterol 239 (H) <200 mg/dL   HDL 60 > OR = 50 mg/dL   Triglycerides 562 <130 mg/dL   LDL Cholesterol (Calc) 152 (H) mg/dL (calc)   Total CHOL/HDL Ratio 4.0 <5.0 (calc)   Non-HDL Cholesterol (Calc) 179 (H) <130 mg/dL (calc)  Q6V  Result Value Ref Range   Hgb A1c MFr Bld 6.3 (H) <5.7 % of total Hgb   Mean Plasma Glucose 134 (calc)   eAG (mmol/L) 7.4 (calc)        Assessment & Plan:      ICD-10-CM   1. Hilar lymphadenopathy  R59.0 Ambulatory referral to Pulmonology    CT Chest Wo Contrast   [facility] ct 03/04/19 mildly enlarged mediastinal and bilateral hilar lymphadenopathy, some signs of small airway disease, 2 to 37-month follow-up CT chest  2. Lung nodule  R91.1 Ambulatory referral to Pulmonology    CT Chest Wo Contrast   Refer to lung nodule program at [facility], will need 2 to 27-month CT chest follow-up  3. Exertional shortness of breath  R06.02 Ambulatory referral to Pulmonology   trial of inhalers and steriod burst, will likely need daily maintenance inhalers would like pulmonology to evaluate possible PFTs?   4. Wheeze  R06.2 Ambulatory referral to Pulmonology    predniSONE (DELTASONE) 20 MG tablet   saba and steroid burst  5. Small airways disease  J98.4    per CT - will tx with steroids start SABA, refer to pulmonology for further eval and tx  6. Mediastinal lymphadenopathy  R59.0 CT Chest Wo Contrast   unknown etiology, CT monitoring/f/up referred to pulmonology for eval possible biopsy/BAL?    Mediastinal lymphadenopathy in differential is neoplastic and reactive etiologies, new pulmonary symptoms  -the patient has a very difficult time explaining how long she has had exertional dyspnea or exertional wheeze.   She is a former smoker, possibly some history of lung disease as a small child.   Recent labs were unremarkable she is not having any night sweats, unintentional weight loss.    I did review with her at length the CT  findings today, explained possible follow-up of repeated CT scans, pulmonary function testing, getting biopsies or seeing a few specialist including the lung nodule team at [facility] and pulmonology for further evaluation.      Danelle Berry, PA-C 04/01/19 2:39 PM

## 2019-04-04 ENCOUNTER — Other Ambulatory Visit: Payer: Self-pay | Admitting: Oncology

## 2019-04-07 ENCOUNTER — Other Ambulatory Visit: Payer: Self-pay | Admitting: Family Medicine

## 2019-04-07 ENCOUNTER — Ambulatory Visit
Admission: RE | Admit: 2019-04-07 | Discharge: 2019-04-07 | Disposition: A | Discharge status: Home / Self Care | Payer: Self-pay | Source: Ambulatory Visit | Attending: Family Medicine | Admitting: Family Medicine

## 2019-04-07 DIAGNOSIS — R911 Solitary pulmonary nodule: Secondary | ICD-10-CM

## 2019-04-07 DIAGNOSIS — R590 Localized enlarged lymph nodes: Secondary | ICD-10-CM

## 2019-04-24 ENCOUNTER — Other Ambulatory Visit: Payer: Self-pay | Admitting: Oncology

## 2019-04-24 DIAGNOSIS — R911 Solitary pulmonary nodule: Secondary | ICD-10-CM

## 2019-04-24 NOTE — Progress Notes (Signed)
  Pulmonary Nodule Clinic Telephone Note [facility]   Received referral from Danelle Berry, [place] (PCP)  HPI: Patient was evaluated by physical medicine provider with [facility] for chronic neck and back pain.  Subsequent imaging included a cervical MRI which showed incomplete visualization of abnormal soft tissue and upper chest and a follow-up CT chest was recommended.  CT chest (03/04/19) showed mildly enlarged mediastinal and bilateral hilar lymphadenopathy, scattered partial calcified right lung nodules and indeterminate right 2 mm right lower lobe groundglass opacity.  It was suggested she have a repeat CT chest in 2 to 3 months for follow-up.  Review and Recommendations: I personally reviewed all patient's previous imaging including most recent CT chest available in care everywhere which revealed the findings listed above.  Reviewed previous chest x-ray from 03/19/2016 which revealed no active cardiopulmonary disease.  No other images available for comparison.  I recommend follow-up with noncontrasted CT scan of the chest in 2 to 3 months from previous.  Expected date mid March to early April 2021.  Social History: Patient has past smoking history for half a pack per day for about 22 years.  She quit smoking greater than 9 years ago.  High risk factors include: History of heavy smoking, exposure to asbestos, radium or uranium, personal family history of lung cancer, older age, sex (females greater than males), race (black and native [place] greater than weight), marginal speculation, upper lobe location, multiplicity (less than 5 nodules increases risk for malignancy) and emphysema and/or pulmonary fibrosis.   This recommendation follows the consensus statement: Guidelines for Management of Incidental Pulmonary Nodules Detected on CT Images: From the Fleischner Society 2017; Radiology 2017; 284:228-243.    I have placed order for CT scan without contrast to be completed  approximately 2-3 months from previous.  Disposition: Order placed for repeat CT chest. Will notify Micael Hampshire in scheduling. [address] to call patient with appointment date and time. Return to pulmonary nodule clinic a few days after his repeat imaging to discuss results and plan moving forward.  Durenda Hurt, NP 04/24/2019 10:15 AM

## 2019-04-25 ENCOUNTER — Telehealth: Payer: Self-pay | Admitting: *Deleted

## 2019-04-25 NOTE — Telephone Encounter (Signed)
Referral received for pt to be seen in Lung Nodule Clinic for further workup of incidental lung nodule with follow up CT scan. Left message with patient to call back to discuss clinic and review recommendations and upcoming appts including follow up CT scan and visit with Jenny Burns, NP to discuss results. Awaiting call back.  

## 2019-05-01 ENCOUNTER — Telehealth: Payer: Self-pay | Admitting: *Deleted

## 2019-05-01 NOTE — Telephone Encounter (Signed)
Attempt made to contact pt to review lung nodule clinic and upcoming appts scheduled. Pt did not answer and unable to leave message due to full mailbox. Will try again at a later date.

## 2019-05-09 ENCOUNTER — Other Ambulatory Visit: Payer: BLUE CROSS/BLUE SHIELD

## 2019-05-12 ENCOUNTER — Inpatient Hospital Stay: Payer: BLUE CROSS/BLUE SHIELD | Admitting: Oncology

## 2019-05-13 ENCOUNTER — Encounter: Payer: Self-pay | Admitting: Family Medicine

## 2019-05-13 ENCOUNTER — Other Ambulatory Visit: Payer: Self-pay

## 2019-05-13 ENCOUNTER — Telehealth (INDEPENDENT_AMBULATORY_CARE_PROVIDER_SITE_OTHER): Payer: BLUE CROSS/BLUE SHIELD | Admitting: Family Medicine

## 2019-05-13 VITALS — BP 122/80 | HR 80 | Temp 97.9°F | Resp 14 | Ht 64.0 in | Wt 186.3 lb

## 2019-05-13 DIAGNOSIS — M5136 Other intervertebral disc degeneration, lumbar region: Secondary | ICD-10-CM

## 2019-05-13 DIAGNOSIS — J984 Other disorders of lung: Secondary | ICD-10-CM

## 2019-05-13 DIAGNOSIS — Z6831 Body mass index (BMI) 31.0-31.9, adult: Secondary | ICD-10-CM

## 2019-05-13 DIAGNOSIS — Z5181 Encounter for therapeutic drug level monitoring: Secondary | ICD-10-CM

## 2019-05-13 DIAGNOSIS — Z8249 Family history of ischemic heart disease and other diseases of the circulatory system: Secondary | ICD-10-CM | POA: Diagnosis not present

## 2019-05-13 DIAGNOSIS — E669 Obesity, unspecified: Secondary | ICD-10-CM

## 2019-05-13 DIAGNOSIS — E785 Hyperlipidemia, unspecified: Secondary | ICD-10-CM

## 2019-05-13 DIAGNOSIS — R0602 Shortness of breath: Secondary | ICD-10-CM | POA: Diagnosis not present

## 2019-05-13 DIAGNOSIS — R7303 Prediabetes: Secondary | ICD-10-CM

## 2019-05-13 LAB — COMPLETE METABOLIC PANEL WITH GFR
AG Ratio: 1.6 (calc) (ref 1.0–2.5)
ALT: 25 U/L (ref 6–29)
AST: 23 U/L (ref 10–35)
Albumin: 4 g/dL (ref 3.6–5.1)
Alkaline phosphatase (APISO): 125 U/L (ref 37–153)
BUN: 14 mg/dL (ref 7–25)
CO2: 28 mmol/L (ref 20–32)
Calcium: 9.4 mg/dL (ref 8.6–10.4)
Chloride: 108 mmol/L (ref 98–110)
Creat: 0.94 mg/dL (ref 0.50–1.05)
GFR, Est African American: 78 mL/min/{1.73_m2} (ref >=60)
GFR, Est Non African American: 67 mL/min/{1.73_m2} (ref >=60)
Globulin: 2.5 g/dL (calc) (ref 1.9–3.7)
Glucose, Bld: 102 mg/dL — ABNORMAL HIGH (ref 65–99)
Potassium: 3.7 mmol/L (ref 3.5–5.3)
Sodium: 143 mmol/L (ref 135–146)
Total Bilirubin: 0.6 mg/dL (ref 0.2–1.2)
Total Protein: 6.5 g/dL (ref 6.1–8.1)

## 2019-05-13 LAB — LIPID PANEL
Cholesterol: 145 mg/dL (ref <200)
HDL: 53 mg/dL (ref >=50)
LDL Cholesterol (Calc): 64 mg/dL (calc)
Non-HDL Cholesterol (Calc): 92 mg/dL (calc) (ref <130)
Total CHOL/HDL Ratio: 2.7 (calc) (ref <5.0)
Triglycerides: 211 mg/dL — ABNORMAL HIGH (ref <150)

## 2019-05-13 NOTE — Progress Notes (Signed)
Name: Brenda Ellison   MRN: [id number]    DOB: [birth year]   Date:05/13/2019       Progress Note  Chief Complaint  Patient presents with  . Follow-up  . Hyperlipidemia     Subjective:   Brenda Ellison is a 59 y.o. female, presents to clinic for routine follow up on the conditions listed above.  Here for f/up on HLD after CPE 3 months ago where cholesterol was found to be elevated and patient has significant family history so patient was recommended to start a statin medication Hyperlipidemia: Current Medication Regimen:  lipitor 20 mg taking daily, patient endorses good compliance, states that she is started at been taking without any SE or concerns Last Lipids: Lab Results  Component Value Date   CHOL 239 (H) 02/04/2019   HDL 60 02/04/2019   LDLCALC 152 (H) 02/04/2019   TRIG 148 02/04/2019   CHOLHDL 4.0 02/04/2019  - Current Diet:  No diet changes -she is trying to avoid fried meats but has not really changed much in her diet - Denies: Chest pain,  myalgias. - Risk factors for atherosclerosis: hypercholesterolemia and hypertension  Prediabetes: Lab Results  Component Value Date   HGBA1C 6.3 (H) 02/04/2019  Last A1c was 6.3 she is had a gradual incline in her A1c over the past couple years.  She has gained weight since her last appointment she has not worked on any diet or exercise she is having difficulty with this due to chronic pain and dyspnea on exertion. Labs were about 3 months ago we will wait another 3 months to recheck, she currently does not have any polyuria, polydipsia, unintentional weight loss, fatigue, nocturia  Obesity: Wt Readings from Last 5 Encounters:  05/13/19 186 lb 4.8 oz (84.5 kg)  04/01/19 180 lb 11.2 oz (82 kg)  02/04/19 179 lb 11.2 oz (81.5 kg)  10/25/18 173 lb 14.4 oz (78.9 kg)  04/23/18 167 lb 4.8 oz (75.9 kg)   BMI Readings from Last 5 Encounters:  05/13/19 31.98 kg/m  04/01/19 31.02 kg/m  02/04/19 30.85 kg/m  10/25/18 29.85 kg/m    04/23/18 29.64 kg/m   Patient has gained 7 pounds since her last office visit here for her physical, she expresses that maybe she having hard time breathing because she needs to lose some weight.  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  She had CT scan of the chest and then followed up back at [facility] oncology  CT of the chest on 03/04/2019 showed mildly enlarged mediastinal and bilateral hilar lymphadenopathy, scattered partially calcified right lung nodule and indeterminate right millimeter right lower lobe groundglass opacity  Impression from report copied below:   Mildly enlarged mediastinal and bilateral hilar lymphadenopathy; differential considerations include neoplastic (metastasis) and reactive (granulomatous disease) etiology. Recommend comparison with prior CT if available. Close follow-up CT chest can be obtained in 2-3 months for reassessment.    Scattered partially calcified right lung nodules and indeterminate 4 mm right lower lobe groundglass opacity. Attention on the follow-up is suggested.  Indeterminate 3.3 cm left adrenal nodule, may represent adenoma. Recommend further evaluation with adrenal protocol CT/MRI.    Aneurysmal dilatation of ascending aorta, measuring 4.5 cm.   Patient was referred to pulmonology at [facility] seeing Dr. Cornell Barman -she had a visit on 04/29/2019 for dyspnea on exertion and abnormal CT scan [name], MD - 04/29/2019 3:20 PM  EST  Your CT scan suggests benign findings. 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 Given you have minimal to no symptoms, there is no need for additional diagnostic tests or treatment. The office visit note from 3 9 stated that patient had no symptoms of cough or dyspnea on exertion the plan was to get baseline PFTs and patient did follow-up at [facility] to do this.  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  She does bring with her spirometer and albuterol inhaler, she does not know any change or improvement of her exertional dyspnea.  Symptoms do tend to be when going up a flight of stairs and she states that she can walk longer distances on flat surfaces without any problems.  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 Delsa Grana for evaluation of abnormal CT chest. She had MRI for back pain / sciatica that showed incidental LAD at the carina. This prompted CT chest w/ contrast that demonstrated bilateral hilar LAD and enlarged mediastinal LAD and scattered R lung nodules, 29mm RLL GGO. This prompted referral.  She smoked briefly as a teenager at most a pack a day. She quit after a few months. No significant environmental or workplace exposures. Denies and DOE or SOB. No cough. No chest pain. No HA, focal weakness, gait instability. No weight loss, fever, chills, night sweats. No rash or skin changes.     MRI to lumbar spine and cervical spine seeing specialists at [facility], ortho Dr. Carlis Abbott, has done PT, she continues to complain of back pain from neck to low spine that intermittently radiates down legs  Patient  continues to complain of back pain neck pain that radiates legs but she is established with physical medicine and rehab and also established with Ortho and spine specialist.  I reviewed with her her past visits and encouraged her to follow-up with them and gave her their phone number and printed this on her after visit summary.  Patient stated that she remembered she was post to go back for some injection to help with her back pain.  I encouraged her to do this to help her symptoms and to be reassessed.  She reports that she is not feeling very short of breath with walking long distances on flat surfaces but that her legs will get tired she rubs the top of her thighs bilaterally and points to her low back and states that is where the pain is she denies any claudication sx    Patient Active Problem List   Diagnosis Date Noted  . Lung nodule 04/01/2019  . Wheeze 04/01/2019  . Exertional shortness of breath 04/01/2019  . Mediastinal lymphadenopathy 04/01/2019  . Hilar lymphadenopathy 04/01/2019  . Cervical radiculopathy  10/25/2018  . Stable angina pectoris (HCC) 09/30/2018  . Overweight (BMI 25.0-29.9) 04/23/2018  . DDD (degenerative disc disease), lumbar 11/06/2017  . Cardiac murmur 03/16/2017  . Lumbar radiculopathy 03/16/2017  . Prediabetes 07/12/2016  . Osteoarthritis 06/07/2015  . Mixed hyperlipidemia 06/01/2015  . Degenerative arthritis of hip 10/19/2014  . Arthritis of hand, degenerative 10/05/2014  . 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 Moderate mitral insufficiency 05/01/2014    Past Surgical History:  Procedure Laterality Date  . COLONOSCOPY WITH PROPOFOL N/A 05/15/2017   Procedure: COLONOSCOPY WITH PROPOFOL;  Surgeon: Wyline MoodAnna, Kiran, MD;  [facility];  Service: Gastroenterology;  Laterality: N/A;  . HAND RECONSTRUCTION Bilateral    as a child    Family History  Problem Relation Age of Onset  . Diabetes Brother   . Heart attack Mother   . Breast cancer Neg Hx   . Ovarian  cancer Neg Hx   . Colon cancer Neg Hx   . Heart disease Neg Hx     Social History   Tobacco Use  . Smoking status: Former Smoker    Packs/day: 1.00    Years: 22.00    Pack years: 22.00    Types: Cigarettes    Start date: 02/21/1988    Quit date: 02/20/2010    Years since quitting: 9.2  . Smokeless tobacco: Never Used  Substance Use Topics  . Alcohol use: No  . Drug use: No      Current Outpatient Medications:  .  albuterol (VENTOLIN HFA) 108 (90 Base) MCG/ACT inhaler, Inhale 2 puffs into the lungs every 4 (four) hours as needed for wheezing or shortness of breath., Disp: 18 g, Rfl: 1 .  atorvastatin (LIPITOR) 20 MG tablet, Take 1 tablet (20 mg total) by mouth at bedtime., Disp: 90 tablet, Rfl: 3 .  Cholecalciferol (VITAMIN D3) 2000 UNITS capsule, Take 2,000 Units by mouth daily. , Disp: , Rfl:  .  meloxicam (MOBIC) 15 MG tablet, Take 1 tab PO daily x 2 weeks, then daily as needed for pain., Disp: , Rfl:   Allergies  Allergen Reactions  . Tramadol Rash    Chart Review Today: I personally reviewed active problem list, medication list, allergies, family history, social history, health maintenance, notes from last encounter, lab results, imaging with the patient/caregiver today. Extensive chart review through care everywhere multiple specialist evaluations and testing results  Review of Systems  10 Systems reviewed and are negative for acute change except as noted in the HPI.    Objective:    Vitals:   05/13/19 1539  BP: 122/80  Pulse: 80  Resp: 14  Temp: 97.9 F (36.6 C)  SpO2: 96%  Weight: 186 lb 4.8 oz (84.5 kg)  Height: 5\' 4"  (1.626 m)    Body mass index is 31.98 kg/m.  Physical Exam Vitals and nursing note reviewed.  Constitutional:      General: She is not in acute distress.    Appearance: Normal appearance. She is well-developed. She is obese. She is not ill-appearing, toxic-appearing or diaphoretic.     Comments: Central obesity   HENT:     Head:  Normocephalic and atraumatic.     Right Ear: External ear normal.     Left Ear: External ear normal.  Eyes:     General:        Right eye: No discharge.        Left eye: No discharge.     Conjunctiva/sclera: Conjunctivae normal.  Neck:     Trachea:  No tracheal deviation.  Cardiovascular:     Rate and Rhythm: Normal rate.  Pulmonary:     Effort: Pulmonary effort is normal. No respiratory distress.     Breath sounds: No stridor, decreased air movement or transmitted upper airway sounds.     Comments: Increased AP diameter, no wheezes rales or rhonchi with anterior and posterior auscultation No retractions, accessory muscle use, tachypnea  Abdominal:     Comments: Soft obese abdomen, nondistended, nontender normal bowel sounds x4  Musculoskeletal:     Right lower leg: No edema.     Left lower leg: No edema.  Skin:    General: Skin is warm and dry.     Capillary Refill: Capillary refill takes less than 2 seconds.     Coloration: Skin is not jaundiced or pale.     Findings: No rash.  Neurological:     Mental Status: She is alert. Mental status is at baseline.  Psychiatric:        Attention and Perception: She is inattentive.        Mood and Affect: Mood normal.        Behavior: Behavior is hyperactive. Behavior is cooperative.     Comments: Rapid speech, difficult to understand, but not slurred        PHQ2/9: Depression screen [facility] 2/9 05/13/2019 02/04/2019 10/25/2018 04/23/2018 11/06/2017  Decreased Interest 0 0 0 0 0  Down, Depressed, Hopeless 0 0 0 0 0  PHQ - 2 Score 0 0 0 0 0  Altered sleeping 0 0 0 0 -  Tired, decreased energy 0 0 0 0 -  Change in appetite 0 0 0 0 -  Feeling bad or failure about yourself  0 0 0 0 -  Trouble concentrating 0 0 0 0 -  Moving slowly or fidgety/restless 0 0 0 0 -  Suicidal thoughts 0 0 0 0 -  PHQ-9 Score 0 0 0 0 -  Difficult doing work/chores Not difficult at all Not difficult at all Not difficult at all Not difficult at all -  Some recent  data might be hidden    phq 9 is neg, reviewed  Fall Risk: Fall Risk  05/13/2019 02/04/2019 10/25/2018 04/23/2018 11/06/2017  Falls in the past year? 0 0 0 0 No  Number falls in past yr: 0 0 0 0 -  Injury with Fall? 0 0 0 0 -    Functional Status Survey: Is the patient deaf or have difficulty hearing?: No Does the patient have difficulty seeing, even when wearing glasses/contacts?: No Does the patient have difficulty concentrating, remembering, or making decisions?: No Does the patient have difficulty walking or climbing stairs?: No Does the patient have difficulty dressing or bathing?: No Does the patient have difficulty doing errands alone such as visiting a doctor's office or shopping?: No   Assessment & Plan:     ICD-10-CM   1. Hyperlipidemia, unspecified hyperlipidemia type  E78.5 Lipid panel    COMPLETE METABOLIC PANEL WITH GFR   Tolerating new medication no myalgias side effects or concerns, high cholesterol and strong family history of fatal MI very young age, recheck labs  2. Family history of early CAD  Z82.49    fatal MI  3. Prediabetes  R73.03    Encouraged her to work on healthier diet avoiding sweets, decreasing calories and healthy lifestyle changes as able, will recheck A1c in 3 more months  4. Exertional shortness of breath  R06.02    continued sx when going  up one flight of stairs, encouraged her to use inhaler and spirometer as directed by pulmonology and f/up if sx do not improve or worsen  5. DDD (degenerative disc disease), lumbar  M51.36    Encouraged her to follow-up with her specialist at [facility] for injections or repeat evaluation she is having radicular symptoms  6. Small airways disease  J98.4    Per pulmonology at [facility]  7. Class 1 obesity with serious comorbidity and body mass index (BMI) of 31.0 to 31.9 in adult, unspecified obesity type  E66.9    Z68.31    Encourage patient to work on diet and exercise reducing calories doing activity as tolerated  8.  Encounter for medication monitoring  Z51.81 Lipid panel    COMPLETE METABOLIC PANEL WITH GFR       Return in about 3 months (around 08/13/2019) for routine f/up HTN/HLD/weight/SOB.   Danelle Berry, PA-C 05/13/19 4:03 PM

## 2019-05-13 NOTE — Patient Instructions (Addendum)
Follow up with Cardiology in August -   Please call to make sure you have follow up appointment  Olena Heckle, MD (Attending) [phone] (Work) [phone] (Fax) [address] [facility] [place], [place] [address] Cardiovascular Disease   For your back pain and leg pain (when it radiates to your legs) call your ortho/spine specialists to follow up on your symptoms  [facility] SPINE [facility] [address] [facility]  [address]  [place], [place] [address]  [phone]  ReepJosie Dixon, RN

## 2019-08-05 ENCOUNTER — Other Ambulatory Visit: Payer: Self-pay

## 2019-08-05 ENCOUNTER — Ambulatory Visit (INDEPENDENT_AMBULATORY_CARE_PROVIDER_SITE_OTHER): Payer: BLUE CROSS/BLUE SHIELD | Admitting: Family Medicine

## 2019-08-05 ENCOUNTER — Encounter: Payer: Self-pay | Admitting: Family Medicine

## 2019-08-05 VITALS — BP 116/70 | HR 84 | Temp 97.9°F | Resp 16 | Ht 64.0 in | Wt 184.2 lb

## 2019-08-05 DIAGNOSIS — L602 Onychogryphosis: Secondary | ICD-10-CM | POA: Diagnosis not present

## 2019-08-05 DIAGNOSIS — R7303 Prediabetes: Secondary | ICD-10-CM

## 2019-08-05 DIAGNOSIS — R0602 Shortness of breath: Secondary | ICD-10-CM | POA: Diagnosis not present

## 2019-08-05 DIAGNOSIS — E669 Obesity, unspecified: Secondary | ICD-10-CM | POA: Diagnosis not present

## 2019-08-05 DIAGNOSIS — E782 Mixed hyperlipidemia: Secondary | ICD-10-CM | POA: Diagnosis not present

## 2019-08-05 DIAGNOSIS — Z8249 Family history of ischemic heart disease and other diseases of the circulatory system: Secondary | ICD-10-CM

## 2019-08-05 DIAGNOSIS — Z6831 Body mass index (BMI) 31.0-31.9, adult: Secondary | ICD-10-CM

## 2019-08-05 DIAGNOSIS — D860 Sarcoidosis of lung: Secondary | ICD-10-CM

## 2019-08-05 MED ORDER — ALBUTEROL SULFATE HFA 108 (90 BASE) MCG/ACT IN AERS: 2.0000 | INHALATION_SPRAY | RESPIRATORY_TRACT | 3 refills | Status: DC | PRN

## 2019-08-05 NOTE — Progress Notes (Signed)
Name: Brenda Ellison   MRN: [id number]    DOB: Aug 20, 1960   Date:08/05/2019       Progress Note  Chief Complaint  Patient presents with  . Follow-up  . Hyperlipidemia     Subjective:   Brenda Ellison is a 59 y.o. female, presents to clinic for routine follow up on the conditions listed above.  PT continues to complain of exertional SOB - seen by specialists (multiple) at [facility]  Exertional SOB - "shortwinded" still, SOB when walking up stairs Albuterol - using only once a week Referral has been sent to [facility]  P: [phone] Patient believes that some of her shortness of breath is due to her weight   HLD - lipitor 20 mg, she has been taking medications without any side effects or concerns Lab Results  Component Value Date   CHOL 145 05/13/2019   CHOL 239 (H) 02/04/2019   CHOL 225 (H) 04/23/2018   Lab Results  Component Value Date   HDL 53 05/13/2019   HDL 60 02/04/2019   HDL 61 04/23/2018   Lab Results  Component Value Date   LDLCALC 64 05/13/2019   LDLCALC 152 (H) 02/04/2019   LDLCALC 136 (H) 04/23/2018   Lab Results  Component Value Date   TRIG 211 (H) 05/13/2019   TRIG 148 02/04/2019   TRIG 149 04/23/2018   Lab Results  Component Value Date   CHOLHDL 2.7 05/13/2019   CHOLHDL 4.0 02/04/2019   CHOLHDL 3.7 04/23/2018   No results found for: LDLDIRECT - Denies: Chest pain, myalgias, claudication     Patient Active Problem List   Diagnosis Date Noted  . 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 Class 1 obesity with serious comorbidity and body mass index (BMI) of 31.0 to 31.9 in adult 05/13/2019  . Family history of early CAD 05/13/2019  . Lung nodule 04/01/2019  . Wheeze 04/01/2019  . Exertional shortness of breath 04/01/2019  . Mediastinal lymphadenopathy 04/01/2019  . Hilar lymphadenopathy 04/01/2019  . Cervical radiculopathy 10/25/2018  . Stable angina pectoris (HCC) 09/30/2018  . Overweight (BMI 25.0-29.9) 04/23/2018  .  DDD (degenerative disc disease), lumbar 11/06/2017  . Cardiac murmur 03/16/2017  . Lumbar radiculopathy 03/16/2017  . Prediabetes 07/12/2016  . Osteoarthritis 06/07/2015  . Hyperlipidemia 06/01/2015  . Degenerative arthritis of hip 10/19/2014  . Arthritis of hand, degenerative 10/05/2014  . 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 Moderate mitral insufficiency 05/01/2014    Past Surgical History:  Procedure Laterality Date  . COLONOSCOPY WITH PROPOFOL N/A 05/15/2017   Procedure: COLONOSCOPY WITH PROPOFOL;  Surgeon: Wyline Mood, MD;  Location: [facility] ENDOSCOPY;  Service: Gastroenterology;  Laterality: N/A;  . HAND RECONSTRUCTION Bilateral    as a child    Family History  Problem Relation Age of Onset  . Diabetes Brother   . Heart attack Mother   . Breast cancer Neg Hx   . Ovarian cancer Neg Hx   . Colon cancer Neg Hx   . Heart disease Neg Hx     Social History   Tobacco Use  . Smoking status: Former Smoker    Packs/day: 1.00    Years: 22.00    Pack years: 22.00    Types: Cigarettes    Start date: 02/21/1988    Quit date: 02/20/2010    Years since quitting: 9.4  . Smokeless tobacco: Never Used  Vaping Use  . Vaping Use: Never used  Substance Use Topics  . Alcohol use: No  .  Drug use: No      Current Outpatient Medications:  .  albuterol (VENTOLIN HFA) 108 (90 Base) MCG/ACT inhaler, Inhale 2 puffs into the lungs every 4 (four) hours as needed for wheezing or shortness of breath., Disp: 18 g, Rfl: 1 .  atorvastatin (LIPITOR) 20 MG tablet, Take 1 tablet (20 mg total) by mouth at bedtime., Disp: 90 tablet, Rfl: 3 .  Cholecalciferol (VITAMIN D3) 2000 UNITS capsule, Take 2,000 Units by mouth daily. , Disp: , Rfl:  .  meloxicam (MOBIC) 15 MG tablet, Take 1 tab PO daily x 2 weeks, then daily as needed for pain., Disp: , Rfl:   Allergies  Allergen Reactions  . Tramadol Rash    Chart Review Today: I personally reviewed active problem list, medication list, allergies, family history,  social history, health maintenance, notes from last encounter, lab results, imaging with the patient/caregiver today.   Review of Systems  10 Systems reviewed and are negative for acute change except as noted in the HPI.   Objective:    Vitals:   08/05/19 1433  BP: 116/70  Pulse: 84  Resp: 16  Temp: 97.9 F (36.6 C)  TempSrc: Temporal  SpO2: 97%  Weight: 184 lb 3.2 oz (83.6 kg)  Height: 5\' 4"  (1.626 m)    Body mass index is 31.62 kg/m.  Physical Exam Vitals and nursing note reviewed.  Constitutional:      General: She is not in acute distress.    Appearance: Normal appearance. She is well-developed. She is obese. She is not ill-appearing, toxic-appearing or diaphoretic.     Comments: Central obesity   HENT:     Head: Normocephalic and atraumatic.     Right Ear: External ear normal.     Left Ear: External ear normal.  Eyes:     General:        Right eye: No discharge.        Left eye: No discharge.     Conjunctiva/sclera: Conjunctivae normal.  Neck:     Trachea: No tracheal deviation.  Cardiovascular:     Rate and Rhythm: Normal rate and regular rhythm.     Pulses: Normal pulses.     Heart sounds: Normal heart sounds. No murmur heard.  No friction rub. No gallop.   Pulmonary:     Effort: Pulmonary effort is normal. No respiratory distress.     Breath sounds: No stridor or transmitted upper airway sounds.     Comments: Increased AP diameter, distant breath sounds, no wheezes rales or rhonchi with anterior and posterior auscultation No retractions, accessory muscle use, tachypnea  Abdominal:     General: Bowel sounds are normal. There is no distension.     Palpations: Abdomen is soft.     Comments: Soft obese abdomen, nondistended, nontender normal bowel sounds x4  Musculoskeletal:     Right lower leg: No edema.     Left lower leg: No edema.  Lymphadenopathy:     Upper Body:     Right upper body: No supraclavicular adenopathy.     Left upper body:  Supraclavicular adenopathy present.  Skin:    General: Skin is warm and dry.     Capillary Refill: Capillary refill takes less than 2 seconds.     Coloration: Skin is not jaundiced or pale.     Findings: No rash.  Neurological:     Mental Status: She is alert. Mental status is at baseline.  Psychiatric:        Mood  and Affect: Mood normal.        Behavior: Behavior normal.       PHQ2/9: Depression screen [facility] 2/9 08/05/2019 05/13/2019 02/04/2019 10/25/2018 04/23/2018  Decreased Interest 0 0 0 0 0  Down, Depressed, Hopeless 0 0 0 0 0  PHQ - 2 Score 0 0 0 0 0  Altered sleeping 0 0 0 0 0  Tired, decreased energy 0 0 0 0 0  Change in appetite 0 0 0 0 0  Feeling bad or failure about yourself  0 0 0 0 0  Trouble concentrating 0 0 0 0 0  Moving slowly or fidgety/restless 0 0 0 0 0  Suicidal thoughts 0 0 0 0 0  PHQ-9 Score 0 0 0 0 0  Difficult doing work/chores Not difficult at all Not difficult at all Not difficult at all Not difficult at all Not difficult at all  Some recent data might be hidden    phq 9 is negative, reviewed  Fall Risk: Fall Risk  08/05/2019 05/13/2019 02/04/2019 10/25/2018 04/23/2018  Falls in the past year? 0 0 0 0 0  Number falls in past yr: 0 0 0 0 0  Injury with Fall? 0 0 0 0 0  Follow up Falls evaluation completed - - - -    Functional Status Survey: Is the patient deaf or have difficulty hearing?: No Does the patient have difficulty seeing, even when wearing glasses/contacts?: No Does the patient have difficulty concentrating, remembering, or making decisions?: No Does the patient have difficulty walking or climbing stairs?: No Does the patient have difficulty dressing or bathing?: No Does the patient have difficulty doing errands alone such as visiting a doctor's office or shopping?: No   Assessment & Plan:   1. Long toenail Patient complains of long and painful toenails asked for referral to specialist for further evaluation - Ambulatory referral to  Podiatry  2. Mixed hyperlipidemia Compliant with meds, no SE, no myalgias, fatigue or jaundice  FLP and CMP done recently, improved from prior labs Diet and exercise recommendations for HLD reviewed-patient encouraged to continue to work on diet exercise and weight loss   3. Class 1 obesity with serious comorbidity and body mass index (BMI) of 31.0 to 31.9 in adult, unspecified obesity type Encourage calorie reduction, exercise as able, healthy food choices  4. Exertional shortness of breath Continues to endorse fairly severe and daily respiratory symptoms, I did review specialist notes from [facility], during this visit she did not endorse severe exertional symptoms, encouraged her to follow-up with specialist - albuterol (VENTOLIN HFA) 108 (90 Base) MCG/ACT inhaler; Inhale 2 puffs into the lungs every 4 (four) hours as needed for wheezing or shortness of breath.  Dispense: 18 g; Refill: 3  5. Family history of early CAD On statin, lung disease, blood pressure good today, no chest pain with exertion but will monitor closely with patient's family history  6. Prediabetes Reviewed past labs with the patient, encouraged her to try and avoid sweets, high carb foods, reduce her calories and lose weight if able  7. Sarcoidosis of lung [facility]) Per [facility] documentation patient is diagnosed with sarcoidosis of lung -did review patient's diagnosis and recommendations from pulmonologist, this time due to minimal symptoms they have not advise any additional treatment.  Encouraged pt to f/up with specialists - based on sx progression and imaging pt may need oral glucocorticoids.    Return for 6 months HLD and CPE.   Delsa Grana, PA-C 08/05/19 2:43 PM

## 2019-08-08 ENCOUNTER — Telehealth: Payer: Self-pay | Admitting: Emergency Medicine

## 2019-08-08 NOTE — Telephone Encounter (Signed)
Copied from CRM [id number]. Topic: General - Other >> Aug 07, 2019  8:26 AM Wyonia Hough E wrote: Reason for CRM: Pt wanted to speak with [facility] and wanted a referral to have her appendix checked due to having strange pains / please advise >> Aug 07, 2019  4:21 PM Reggie Pile, NT wrote: Patient calling back in checking on status of message from this morning. Please advise.

## 2019-08-08 NOTE — Telephone Encounter (Signed)
Patient was seen on Tuesday and stated she forgot to ask you can she have a xray of abdomen and pelvis. She stated she has been having abdominal pains and pelvic pains for 3 weeks. Please advise

## 2019-08-11 NOTE — Telephone Encounter (Signed)
lvm to sch appt °

## 2019-08-11 NOTE — Telephone Encounter (Signed)
Please schedule patient.

## 2019-08-18 ENCOUNTER — Other Ambulatory Visit: Payer: Self-pay | Admitting: Family Medicine

## 2019-08-20 ENCOUNTER — Emergency Department: Payer: BLUE CROSS/BLUE SHIELD

## 2019-08-20 ENCOUNTER — Encounter: Payer: Self-pay | Admitting: Emergency Medicine

## 2019-08-20 ENCOUNTER — Other Ambulatory Visit: Payer: Self-pay

## 2019-08-20 ENCOUNTER — Emergency Department
Admission: EM | Admit: 2019-08-20 | Discharge: 2019-08-20 | Disposition: A | Discharge status: Home / Self Care | Payer: BLUE CROSS/BLUE SHIELD | Attending: Emergency Medicine | Admitting: Emergency Medicine

## 2019-08-20 DIAGNOSIS — Y92480 Sidewalk as the place of occurrence of the external cause: Secondary | ICD-10-CM | POA: Diagnosis not present

## 2019-08-20 DIAGNOSIS — S40011A Contusion of right shoulder, initial encounter: Secondary | ICD-10-CM | POA: Diagnosis not present

## 2019-08-20 DIAGNOSIS — Y999 Unspecified external cause status: Secondary | ICD-10-CM | POA: Diagnosis not present

## 2019-08-20 DIAGNOSIS — W101XXA Fall (on)(from) sidewalk curb, initial encounter: Secondary | ICD-10-CM | POA: Insufficient documentation

## 2019-08-20 DIAGNOSIS — S4991XA Unspecified injury of right shoulder and upper arm, initial encounter: Secondary | ICD-10-CM | POA: Diagnosis present

## 2019-08-20 DIAGNOSIS — Z87891 Personal history of nicotine dependence: Secondary | ICD-10-CM | POA: Insufficient documentation

## 2019-08-20 DIAGNOSIS — Y9301 Activity, walking, marching and hiking: Secondary | ICD-10-CM | POA: Diagnosis not present

## 2019-08-20 DIAGNOSIS — Z23 Encounter for immunization: Secondary | ICD-10-CM | POA: Diagnosis not present

## 2019-08-20 MED ORDER — NAPROXEN 375 MG PO TABS: 375.0000 mg | ORAL_TABLET | Freq: Two times a day (BID) | ORAL | 0 refills | Status: DC

## 2019-08-20 MED ORDER — TETANUS-DIPHTH-ACELL PERTUSSIS 5-2.5-18.5 LF-MCG/0.5 IM SUSP
0.5000 mL | Freq: Once | INTRAMUSCULAR | Status: AC
  Administered 2019-08-20: 0.5 mL via INTRAMUSCULAR
  Filled 2019-08-20: qty 0.5

## 2019-08-20 NOTE — Discharge Instructions (Signed)
Follow-up with your primary care provider if any continued problems or not improving.  Ice and elevation to help reduce swelling and pain.  Been taking naproxen 500 mg twice daily with food.  You may take Tylenol along with this if additional pain medication as needed.

## 2019-08-20 NOTE — ED Notes (Signed)
See triage note  Presents sp fall   States she tripped yesterday landed on right shoulder    Increased pain with movement  Good pulses

## 2019-08-20 NOTE — ED Provider Notes (Signed)
[facility] Emergency Department Provider Note   ____________________________________________   First MD Initiated Contact with Patient 08/20/19 [phone]     (approximate)  I have reviewed the triage vital signs and the nursing notes.   HISTORY  Chief Complaint Shoulder Injury   HPI Brenda Ellison is a 59 y.o. female presents to the ED with complaint of right shoulder pain.  Patient states that she fell yesterday when she stepped up on the curb wrong causing her to fall on her right shoulder.  Today she has increased pain with movement.  She denies any head injury or loss of consciousness with this injury.  Patient rates her pain as 7 out of 10.       Past Medical History:  Diagnosis Date  . Arthritis pain   . Bilateral leg pain   . Chronic nasal congestion   . Dizziness of unknown cause   . Heart murmur previously undiagnosed   . Osteoarthritis, hand     Patient Active Problem List   Diagnosis Date Noted  . 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 Class 1 obesity with serious comorbidity and body mass index (BMI) of 31.0 to 31.9 in adult 05/13/2019  . Family history of early CAD 05/13/2019  . Lung nodule 04/01/2019  . Wheeze 04/01/2019  . Exertional shortness of breath 04/01/2019  . Mediastinal lymphadenopathy 04/01/2019  . Hilar lymphadenopathy 04/01/2019  . Cervical radiculopathy 10/25/2018  . Stable angina pectoris (HCC) 09/30/2018  . Overweight (BMI 25.0-29.9) 04/23/2018  . DDD (degenerative disc disease), lumbar 11/06/2017  . Cardiac murmur 03/16/2017  . Lumbar radiculopathy 03/16/2017  . Prediabetes 07/12/2016  . Osteoarthritis 06/07/2015  . Hyperlipidemia 06/01/2015  . Degenerative arthritis of hip 10/19/2014  . Arthritis of hand, degenerative 10/05/2014  . 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 Moderate mitral insufficiency 05/01/2014    Past Surgical History:  Procedure Laterality Date  . COLONOSCOPY WITH PROPOFOL N/A 05/15/2017   Procedure:  COLONOSCOPY WITH PROPOFOL;  Surgeon: Wyline Mood, MD;  Location: [facility] ENDOSCOPY;  Service: Gastroenterology;  Laterality: N/A;  . HAND RECONSTRUCTION Bilateral    as a child    Prior to Admission medications   Medication Sig Start Date End Date Taking? Authorizing Provider  atorvastatin (LIPITOR) 20 MG tablet Take 1 tablet (20 mg total) by mouth at bedtime. 02/06/19  Yes Danelle Berry, PA-C  Cholecalciferol (VITAMIN D3) 2000 UNITS capsule Take 2,000 Units by mouth daily.    Yes [provider]  albuterol (VENTOLIN HFA) 108 (90 Base) MCG/ACT inhaler Inhale 2 puffs into the lungs every 4 (four) hours as needed for wheezing or shortness of breath. 08/05/19   Danelle Berry, PA-C  naproxen (NAPROSYN) 375 MG tablet Take 1 tablet (375 mg total) by mouth 2 (two) times daily with a meal. 08/20/19   Tommi Rumps, PA-C    Allergies Tramadol  Family History  Problem Relation Age of Onset  . Diabetes Brother   . Heart attack Mother   . Breast cancer Neg Hx   . Ovarian cancer Neg Hx   . Colon cancer Neg Hx   . Heart disease Neg Hx     Social History Social History   Tobacco Use  . Smoking status: Former Smoker    Packs/day: 1.00    Years: 22.00    Pack years: 22.00    Types: Cigarettes    Start date: 02/21/1988    Quit date: 02/20/2010    Years since quitting: 9.5  . Smokeless tobacco:  Never Used  Vaping Use  . Vaping Use: Never used  Substance Use Topics  . Alcohol use: No  . Drug use: No    Review of Systems Constitutional: No fever/chills Eyes: No visual changes. ENT: No trauma. Cardiovascular: Denies chest pain. Respiratory: Denies shortness of breath. Gastrointestinal: No abdominal pain.  No nausea, no vomiting. Musculoskeletal: Positive for right shoulder pain. Skin: Negative for rash. Neurological: Negative for headaches, focal weakness or numbness. ____________________________________________   PHYSICAL EXAM:  VITAL SIGNS: ED Triage Vitals  Enc Vitals  Group     BP 08/20/19 0735 136/84     Pulse Rate 08/20/19 0735 68     Resp 08/20/19 0735 18     Temp 08/20/19 0735 98.8 F (37.1 C)     Temp Source 08/20/19 0735 Oral     SpO2 08/20/19 0735 97 %     Weight 08/20/19 0733 184 lb (83.5 kg)     Height 08/20/19 0733 5\' 4"  (1.626 m)     Head Circumference --      Peak Flow --      Pain Score 08/20/19 0733 7     Pain Loc --      Pain Edu? --      Excl. in GC? --    Constitutional: Alert and oriented. Well appearing and in no acute distress. Eyes: Conjunctivae are normal. PERRL. EOMI. Head: Atraumatic. Nose: No congestion/rhinnorhea. Mouth/Throat: Mucous membranes are moist.  Oropharynx non-erythematous. Neck: No stridor.   Cardiovascular: Normal rate, regular rhythm. Grossly normal heart sounds.  Good peripheral circulation. Respiratory: Normal respiratory effort.  No retractions. Lungs CTAB. Gastrointestinal: Soft and nontender. No distention.  Musculoskeletal: On examination of the right shoulder there is no gross deformity and no soft tissue injury noted.  No skin discoloration.  No crepitus noted with range of motion.  There is generalized tenderness but more so at the [facility] joint area.  Nontender right elbow and wrist.  Pulses present.  Patient is able move digits without any difficulty.  No tenderness is noted on palpation of the torso and remaining extremities.  Normal gait was noted. Neurologic:  Normal speech and language. No gross focal neurologic deficits are appreciated. No gait instability. Skin:  Skin is warm, dry.  Very small abrasion noted to her finger. Psychiatric: Mood and affect are normal. Speech and behavior are normal.  ____________________________________________   LABS (all labs ordered are listed, but only abnormal results are displayed)  Labs Reviewed - No data to display  RADIOLOGY  Official radiology report(s): DG Clavicle Right  Result Date: 08/20/2019 CLINICAL DATA:  Tripped and fell yesterday, landing  on right shoulder. EXAM: RIGHT CLAVICLE - 2+ VIEWS COMPARISON:  None. FINDINGS: No evidence for shoulder separation or dislocation. Degenerative changes are seen at the acromioclavicular joint with under surface spurring of the acromion. Although no gross clavicle fracture is evident, nondisplaced fracture of the distal clavicle at the [facility] joint cannot be completely excluded on this study. Degenerative spurring noted in the glenohumeral joint. IMPRESSION: 1. No evidence for shoulder separation or dislocation. 2. Degenerative changes in the [facility] joint with cortical angulation along the inferior aspect of the distal most aspect of the clavicle. Not definite, but nondisplaced corner fracture cannot be excluded. 3. Degenerative changes in the glenohumeral joint and acromioclavicular joint. Electronically Signed   By: [facility] M.D.   On: 08/20/2019 10:00   DG Shoulder Right  Result Date: 08/20/2019 CLINICAL DATA:  Fall with generalized shoulder pain EXAM: RIGHT  SHOULDER - 2+ VIEW COMPARISON:  Outside CT of the chest.  No additional comparison FINDINGS: Degenerative changes about the RIGHT shoulder. No sign of acute fracture or dislocation. Acromioclavicular degenerative changes in addition to glenohumeral degenerative changes. On the scapular Y-view there is mild offset of the distal clavicle and acromion raising the question of AC joint injury. Not well assessed on other views with AP view not showing the same degree of offset. IMPRESSION: 1. Degenerative changes about the RIGHT shoulder. No acute fracture or dislocation. 2. Question AC joint injury. Consider dedicated clavicular and or weighted views as warranted for further assessment. Electronically Signed   By: Donzetta Kohut M.D.   On: 08/20/2019 08:49    ____________________________________________   PROCEDURES  Procedure(s) performed (including Critical Care):  Procedures   ____________________________________________   INITIAL IMPRESSION /  ASSESSMENT AND PLAN / ED COURSE  As part of my medical decision making, I reviewed the following data within the electronic MEDICAL RECORD NUMBER Notes from prior ED visits and [place] Controlled Substance Database  59 year old female presents to the ED with complaint of right shoulder pain.  Patient tripped yesterday when she stepped off a curb wrong.  She states she has continued to have pain since that time.  Physical exam was positive for decreased range of motion secondary to pain.  X-rays were negative for fracture but patient was made aware that she does have osteoarthritis.  She was placed on naproxen 375 mg twice daily with food.  Ice and elevation.  She is to follow-up with her PCP if any continued problems.  ____________________________________________   FINAL CLINICAL IMPRESSION(S) / ED DIAGNOSES  Final diagnoses:  Contusion of right shoulder, initial encounter     ED Discharge Orders         Ordered    naproxen (NAPROSYN) 375 MG tablet  2 times daily with meals     Discontinue  Reprint     08/20/19 1017           Note:  This document was prepared using Dragon voice recognition software and may include unintentional dictation errors.    Tommi Rumps, PA-C 08/20/19 1112    Chesley Noon, MD 08/21/19 [phone]

## 2019-08-20 NOTE — ED Triage Notes (Signed)
Pt here for right shoulder pain. Stepped off curb wrong yesterday. Increased pain with movement.  No obvious deformity.

## 2019-09-23 DIAGNOSIS — I712 Thoracic aortic aneurysm, without rupture, unspecified: Secondary | ICD-10-CM | POA: Insufficient documentation

## 2019-10-07 ENCOUNTER — Ambulatory Visit: Payer: BLUE CROSS/BLUE SHIELD | Admitting: Podiatry

## 2019-10-07 ENCOUNTER — Other Ambulatory Visit: Payer: Self-pay

## 2019-10-08 ENCOUNTER — Telehealth: Payer: Self-pay

## 2019-10-08 NOTE — Telephone Encounter (Signed)
Copied from CRM [id number]. Topic: Referral - Status >> Oct 08, 2019  8:15 AM Leafy Ro wrote: Reason for CRM: pt is calling and she went to her appt 10-07-2019 and [facility] is not in network with her [employer]. Pt need someone in her [employer] podiatrist. Pt needs to be seen for ankle pain and toenail cut

## 2019-10-08 NOTE — Telephone Encounter (Signed)
Patient does not have the usual [employer] - must use [facility] providers. Referral has been sent to Dr. London Sheer with [facility] in [place].

## 2019-11-17 NOTE — Progress Notes (Deleted)
Patient ID: Brenda Ellison, female    DOB: 1960/11/01, 59 y.o.   MRN: [id number]  PCP: Danelle Berry, PA-C  No chief complaint on file.   Subjective:   Brenda Ellison is a 59 y.o. 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   Class 1 obesity with serious comorbidity and body mass index (BMI) of 31.0 to 31.9 in adult 05/13/2019   Family history of early CAD 05/13/2019   Lung nodule 04/01/2019   Wheeze 04/01/2019   Exertional shortness of breath 04/01/2019   Mediastinal lymphadenopathy 04/01/2019   Hilar lymphadenopathy 04/01/2019   Cervical radiculopathy 10/25/2018   Stable angina pectoris (HCC) 09/30/2018   Overweight (BMI 25.0-29.9) 04/23/2018   DDD (degenerative disc disease), lumbar 11/06/2017   Cardiac murmur 03/16/2017   Lumbar radiculopathy 03/16/2017   Prediabetes 07/12/2016   Osteoarthritis 06/07/2015   Hyperlipidemia 06/01/2015   Degenerative arthritis of hip 10/19/2014   Arthritis of hand, degenerative 10/05/2014   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 , Disp: , Rfl:    naproxen (NAPROSYN) 375 MG tablet, Take 1 tablet (375 mg total) by mouth 2 (two) times daily with a meal., Disp: 20 tablet, Rfl: 0   Allergies  Allergen Reactions   Tramadol Rash     Social History   Tobacco Use   Smoking status: Former Smoker    Packs/day: 1.00    Years: 22.00    Pack years: 22.00    Types:  Cigarettes    Start date: 02/21/1988    Quit date: 02/20/2010    Years since quitting: 9.7   Smokeless tobacco: Never Used  Vaping Use   Vaping Use: Never used  Substance Use Topics   Alcohol use: No   Drug use: No      Chart Review Today: ***  Review of Systems     Objective:   There were no vitals filed for this visit.  There is no height or weight on file to calculate BMI.  Physical Exam   Results for orders placed or performed in visit on 05/13/19  Lipid panel  Result Value Ref Range   Cholesterol 145 <200 mg/dL   HDL 53 > OR = 50 mg/dL   Triglycerides 383 (H) <150 mg/dL   LDL Cholesterol (Calc) 64 mg/dL (calc)   Total CHOL/HDL Ratio 2.7 <5.0 (calc)   Non-HDL Cholesterol (Calc) 92 <291 mg/dL (calc)  COMPLETE METABOLIC PANEL WITH GFR  Result Value Ref Range   Glucose, Bld 102 (H) 65 - 99 mg/dL   BUN 14 7 - 25 mg/dL   Creat 9.16 6.06 - 0.04 mg/dL   GFR, Est Non African American 67 > OR = 60 mL/min/1.62m2   GFR, Est African American 78 > OR = 60 mL/min/1.78m2   BUN/Creatinine Ratio NOT APPLICABLE 6 - 22 (calc)   Sodium 143 135 - 146 mmol/L   Potassium 3.7 3.5 -  5.3 mmol/L   Chloride 108 98 - 110 mmol/L   CO2 28 20 - 32 mmol/L   Calcium 9.4 8.6 - 10.4 mg/dL   Total Protein 6.5 6.1 - 8.1 g/dL   Albumin 4.0 3.6 - 5.1 g/dL   Globulin 2.5 1.9 - 3.7 g/dL (calc)   AG Ratio 1.6 1.0 - 2.5 (calc)   Total Bilirubin 0.6 0.2 - 1.2 mg/dL   Alkaline phosphatase (APISO) 125 37 - 153 U/L   AST 23 10 - 35 U/L   ALT 25 6 - 29 U/L       Assessment & Plan:   Marcos Eke, CMA 11/17/19 2:58 PM

## 2019-11-18 ENCOUNTER — Ambulatory Visit: Payer: BLUE CROSS/BLUE SHIELD | Admitting: Family Medicine

## 2019-11-19 ENCOUNTER — Encounter: Payer: Self-pay | Admitting: Family Medicine

## 2020-02-05 ENCOUNTER — Ambulatory Visit: Payer: BLUE CROSS/BLUE SHIELD | Admitting: Family Medicine

## 2020-02-17 ENCOUNTER — Encounter: Payer: Self-pay | Admitting: Family Medicine

## 2020-02-17 ENCOUNTER — Other Ambulatory Visit: Payer: Self-pay

## 2020-02-17 ENCOUNTER — Inpatient Hospital Stay (HOSPITAL_COMMUNITY): Admit: 2020-02-17 | Payer: BLUE CROSS/BLUE SHIELD

## 2020-02-17 ENCOUNTER — Ambulatory Visit (INDEPENDENT_AMBULATORY_CARE_PROVIDER_SITE_OTHER): Payer: BLUE CROSS/BLUE SHIELD | Admitting: Family Medicine

## 2020-02-17 VITALS — BP 128/70 | HR 86 | Temp 97.7°F | Resp 16 | Ht 64.0 in | Wt 182.6 lb

## 2020-02-17 DIAGNOSIS — E669 Obesity, unspecified: Secondary | ICD-10-CM

## 2020-02-17 DIAGNOSIS — E782 Mixed hyperlipidemia: Secondary | ICD-10-CM | POA: Diagnosis not present

## 2020-02-17 DIAGNOSIS — Z23 Encounter for immunization: Secondary | ICD-10-CM

## 2020-02-17 DIAGNOSIS — G2581 Restless legs syndrome: Secondary | ICD-10-CM

## 2020-02-17 DIAGNOSIS — K219 Gastro-esophageal reflux disease without esophagitis: Secondary | ICD-10-CM

## 2020-02-17 DIAGNOSIS — Z6831 Body mass index (BMI) 31.0-31.9, adult: Secondary | ICD-10-CM

## 2020-02-17 DIAGNOSIS — E66811 Obesity, class 1: Secondary | ICD-10-CM

## 2020-02-17 DIAGNOSIS — Z0000 Encounter for general adult medical examination without abnormal findings: Secondary | ICD-10-CM

## 2020-02-17 DIAGNOSIS — J309 Allergic rhinitis, unspecified: Secondary | ICD-10-CM | POA: Insufficient documentation

## 2020-02-17 DIAGNOSIS — Z1231 Encounter for screening mammogram for malignant neoplasm of breast: Secondary | ICD-10-CM | POA: Diagnosis not present

## 2020-02-17 DIAGNOSIS — N3941 Urge incontinence: Secondary | ICD-10-CM

## 2020-02-17 DIAGNOSIS — R7303 Prediabetes: Secondary | ICD-10-CM

## 2020-02-17 DIAGNOSIS — Z124 Encounter for screening for malignant neoplasm of cervix: Secondary | ICD-10-CM

## 2020-02-17 NOTE — Patient Instructions (Signed)
Health Maintenance  Topic Date Due  . Pap Smear  03/29/2019  . Flu Shot  09/21/2019  . COVID-19 Vaccine (2 - Moderna 3-dose booster series) 03/22/2020*  . Mammogram  03/19/2020  . Colon Cancer Screening  05/16/2027  . Tetanus Vaccine  08/19/2029  .  Hepatitis C: One time screening is recommended by Center for Disease Control  (CDC) for  adults born from [birth year] through 1963.   Completed  . HIV Screening  Completed  *Topic was postponed. The date shown is not the original due date.      Preventive Care 22-44 Years Old, Female Preventive care refers to visits with your health care provider and lifestyle choices that can promote health and wellness. This includes:  A yearly physical exam. This may also be called an annual well check.  Regular dental visits and eye exams.  Immunizations.  Screening for certain conditions.  Healthy lifestyle choices, such as eating a healthy diet, getting regular exercise, not using drugs or products that contain nicotine and tobacco, and limiting alcohol use. What can I expect for my preventive care visit? Physical exam Your health care provider will check your:  Height and weight. This may be used to calculate body mass index (BMI), which tells if you are at a healthy weight.  Heart rate and blood pressure.  Skin for abnormal spots. Counseling Your health care provider may ask you questions about your:  Alcohol, tobacco, and drug use.  Emotional well-being.  Home and relationship well-being.  Sexual activity.  Eating habits.  Work and work Statistician.  Method of birth control.  Menstrual cycle.  Pregnancy history. What immunizations do I need?  Influenza (flu) vaccine  This is recommended every year. Tetanus, diphtheria, and pertussis (Tdap) vaccine  You may need a Td booster every 10 years. Varicella (chickenpox) vaccine  You may need this if you have not been vaccinated. Zoster (shingles) vaccine  You may need this  after age 71. Measles, mumps, and rubella (MMR) vaccine  You may need at least one dose of MMR if you were born in 1957 or later. You may also need a second dose. Pneumococcal conjugate (PCV13) vaccine  You may need this if you have certain conditions and were not previously vaccinated. Pneumococcal polysaccharide (PPSV23) vaccine  You may need one or two doses if you smoke cigarettes or if you have certain conditions. Meningococcal conjugate (MenACWY) vaccine  You may need this if you have certain conditions. Hepatitis A vaccine  You may need this if you have certain conditions or if you travel or work in places where you may be exposed to hepatitis A. Hepatitis B vaccine  You may need this if you have certain conditions or if you travel or work in places where you may be exposed to hepatitis B. Haemophilus influenzae type b (Hib) vaccine  You may need this if you have certain conditions. Human papillomavirus (HPV) vaccine  If recommended by your health care provider, you may need three doses over 6 months. You may receive vaccines as individual doses or as more than one vaccine together in one shot (combination vaccines). Talk with your health care provider about the risks and benefits of combination vaccines. What tests do I need? Blood tests  Lipid and cholesterol levels. These may be checked every 5 years, or more frequently if you are over 27 years old.  Hepatitis C test.  Hepatitis B test. Screening  Lung cancer screening. You may have this screening every year starting  at age 61 if you have a 30-pack-year history of smoking and currently smoke or have quit within the past 15 years.  Colorectal cancer screening. All adults should have this screening starting at age 4 and continuing until age 69. Your health care provider may recommend screening at age 49 if you are at increased risk. You will have tests every 1-10 years, depending on your results and the type of  screening test.  Diabetes screening. This is done by checking your blood sugar (glucose) after you have not eaten for a while (fasting). You may have this done every 1-3 years.  Mammogram. This may be done every 1-2 years. Talk with your health care provider about when you should start having regular mammograms. This may depend on whether you have a family history of breast cancer.  BRCA-related cancer screening. This may be done if you have a family history of breast, ovarian, tubal, or peritoneal cancers.  Pelvic exam and Pap test. This may be done every 3 years starting at age 9. Starting at age 57, this may be done every 5 years if you have a Pap test in combination with an HPV test. Other tests  Sexually transmitted disease (STD) testing.  Bone density scan. This is done to screen for osteoporosis. You may have this scan if you are at high risk for osteoporosis. Follow these instructions at home: Eating and drinking  Eat a diet that includes fresh fruits and vegetables, whole grains, lean protein, and low-fat dairy.  Take vitamin and mineral supplements as recommended by your health care provider.  Do not drink alcohol if: ? Your health care provider tells you not to drink. ? You are pregnant, may be pregnant, or are planning to become pregnant.  If you drink alcohol: ? Limit how much you have to 0-1 drink a day. ? Be aware of how much alcohol is in your drink. In the [place] one drink equals one 12 oz bottle of beer (355 mL), one 5 oz glass of wine (148 mL), or one 1 oz glass of hard liquor (44 mL). Lifestyle  Take daily care of your teeth and gums.  Stay active. Exercise for at least 30 minutes on 5 or more days each week.  Do not use any products that contain nicotine or tobacco, such as cigarettes, e-cigarettes, and chewing tobacco. If you need help quitting, ask your health care provider.  If you are sexually active, practice safe sex. Use a condom or other form of birth  control (contraception) in order to prevent pregnancy and STIs (sexually transmitted infections).  If told by your health care provider, take low-dose aspirin daily starting at age 83. What's next?  Visit your health care provider once a year for a well check visit.  Ask your health care provider how often you should have your eyes and teeth checked.  Stay up to date on all vaccines. This information is not intended to replace advice given to you by your health care provider. Make sure you discuss any questions you have with your health care provider. Document Revised: 10/18/2017 Document Reviewed: 10/18/2017 Elsevier Patient Education  2020 [employer].

## 2020-02-17 NOTE — Progress Notes (Addendum)
Patient: Brenda Ellison, Female    DOB: 05/05/1960, 59 y.o.   MRN: [id number] Brenda Berry, PA-C Visit Date: 02/17/2020  Today's Provider: Danelle Berry, PA-C   Chief Complaint  Patient presents with  . Annual Exam   Subjective:   Annual physical exam:  Brenda Ellison is a 59 y.o. female who presents today for complete physical exam:  Exercise/Activity:  No exercise  Diet/nutrition:   Avoiding fried and greasy foods, whole grains/wheats Sleep:  Sleeping ok  PAP and HPV neg 03/28/2016 - repeat 03/2019 to 03/2021   Multiple other acute concerns -  GERD- doesn't know if she has it, chronic cough RLS -she endorses leg cramps that wake her at night cause pain and disrupt her sleep She is concerned about her bladder states that she has a hard time holding it when she has the urge to go to the restroom if she bends forward this helps hold her urine and then when she stands back up it is harder to hold her urine She asked about her kidneys and wants those tested  HLD-she is compliant with her statin, so no side effects myalgias or concerns, last lipids showed improvement from prior to when on statins, she does have significant cardiac and pulmonary history otherwise has not had any change to her breathing or chest pain type symptoms no claudication symptoms  Chronic lung disease, sarcoidosis, managed by [facility] pulmonology  USPSTF grade A and B recommendations - reviewed and addressed today  Depression:  Phq 9 completed today by patient, was reviewed by me with patient in the room PHQ score is neg, pt feels moods are good PHQ 2/9 Scores 02/17/2020 08/05/2019 05/13/2019 02/04/2019  PHQ - 2 Score 0 0 0 0  PHQ- 9 Score - 0 0 0   Depression screen [facility] 2/9 02/17/2020 08/05/2019 05/13/2019 02/04/2019 10/25/2018  Decreased Interest 0 0 0 0 0  Down, Depressed, Hopeless 0 0 0 0 0  PHQ - 2 Score 0 0 0 0 0  Altered sleeping - 0 0 0 0  Tired, decreased energy - 0 0 0 0  Change in  appetite - 0 0 0 0  Feeling bad or failure about yourself  - 0 0 0 0  Trouble concentrating - 0 0 0 0  Moving slowly or fidgety/restless - 0 0 0 0  Suicidal thoughts - 0 0 0 0  PHQ-9 Score - 0 0 0 0  Difficult doing work/chores - Not difficult at all Not difficult at all Not difficult at all Not difficult at all  Some recent data might be hidden    Alcohol screening: Flowsheet Row Office Visit from 02/17/2020 in [facility]  AUDIT-C Score 0      Immunizations and Health Maintenance: Health Maintenance  Topic Date Due  . PAP SMEAR-Modifier  03/29/2019  . INFLUENZA VACCINE  09/21/2019  . COVID-19 Vaccine (2 - Moderna 3-dose booster series) 03/22/2020 (Originally 12/11/2019)  . MAMMOGRAM  03/19/2020  . COLONOSCOPY (Pts 45-98yrs Insurance coverage will need to be confirmed)  05/16/2027  . TETANUS/TDAP  08/19/2029  . Hepatitis C Screening  Completed  . HIV Screening  Completed     Hep C Screening: done in the past  STD testing and prevention (HIV/chl/gon/syphilis):  see above, no additional testing desired by pt today - HIV done, not sexually active right now  Intimate partner violence: fiance, safe  Sexual History/Pain during Intercourse: engaged, waiting until married  Menstrual History/LMP/Abnormal  Bleeding:  No AUB No LMP recorded. Patient is postmenopausal.  Incontinence Symptoms:  Some urge incontinence - wants her kidneys and bladder checked out  Breast cancer:   Due in Jan Last Mammogram: *see HM list above  Cervical cancer screening: done in 2018 PAP and HPV Pt no family hx of cancers - breast, ovarian, uterine, colon:     Osteoporosis:   Discussion on osteoporosis per age, including high calcium and vitamin D supplementation, weight bearing exercises Pt is supplementing with daily calcium/Vit D.  Skin cancer:  Hx of skin CA -  NO Discussed atypical lesions   Colorectal cancer:   Colonoscopy is UTD Discussed concerning signs and sx of  CRC, pt denies   Lung cancer:   Low Dose CT Chest recommended if Age 80-80 years, 30 pack-year currently smoking OR have quit w/in 15years. Patient does not qualify.    Social History   Tobacco Use  . Smoking status: Former Smoker    Packs/day: 1.00    Years: 22.00    Pack years: 22.00    Types: Cigarettes    Start date: 02/21/1988    Quit date: 02/20/2010    Years since quitting: 9.9  . Smokeless tobacco: Never Used  Vaping Use  . Vaping Use: Never used  Substance Use Topics  . Alcohol use: No  . Drug use: No     Flowsheet Row Office Visit from 02/17/2020 in [facility]  AUDIT-C Score 0      Family History  Problem Relation Age of Onset  . Diabetes Brother   . Heart attack Mother   . Breast cancer Neg Hx   . Ovarian cancer Neg Hx   . Colon cancer Neg Hx   . Heart disease Neg Hx      Blood pressure/Hypertension: BP Readings from Last 3 Encounters:  02/17/20 128/70  08/20/19 136/84  08/05/19 116/70    Weight/Obesity: Wt Readings from Last 3 Encounters:  02/17/20 182 lb 9.6 oz (82.8 kg)  08/20/19 184 lb (83.5 kg)  08/05/19 184 lb 3.2 oz (83.6 kg)   BMI Readings from Last 3 Encounters:  02/17/20 31.34 kg/m  08/20/19 31.58 kg/m  08/05/19 31.62 kg/m     Lipids:  Lab Results  Component Value Date   CHOL 145 05/13/2019   CHOL 239 (H) 02/04/2019   CHOL 225 (H) 04/23/2018   Lab Results  Component Value Date   HDL 53 05/13/2019   HDL 60 02/04/2019   HDL 61 04/23/2018   Lab Results  Component Value Date   LDLCALC 64 05/13/2019   LDLCALC 152 (H) 02/04/2019   LDLCALC 136 (H) 04/23/2018   Lab Results  Component Value Date   TRIG 211 (H) 05/13/2019   TRIG 148 02/04/2019   TRIG 149 04/23/2018   Lab Results  Component Value Date   CHOLHDL 2.7 05/13/2019   CHOLHDL 4.0 02/04/2019   CHOLHDL 3.7 04/23/2018   No results found for: LDLDIRECT Based on the results of lipid panel his/her cardiovascular risk factor ( using [place] Cohort  )  in the next 10 years is: The 10-year ASCVD risk score Denman George DC Jr., et al., 2013) is: 3%   Values used to calculate the score:     Age: 44 years     Sex: Female     Is Non-Hispanic African American: Yes     Diabetic: No     Tobacco smoker: No     Systolic Blood Pressure: 128 mmHg  Is BP treated: No     HDL Cholesterol: 53 mg/dL     Total Cholesterol: 145 mg/dL Glucose:  Glucose  Date Value Ref Range Status  01/20/2013 86 65 - 99 mg/dL Final   Glucose, Bld  Date Value Ref Range Status  05/13/2019 102 (H) 65 - 99 mg/dL Final    Comment:    .            Fasting reference interval . For someone without known diabetes, a glucose value between 100 and 125 mg/dL is consistent with prediabetes and should be confirmed with a follow-up test. .   02/04/2019 89 65 - 99 mg/dL Final    Comment:    .            Fasting reference interval .   04/23/2018 124 (H) 65 - 99 mg/dL Final    Comment:    .            Fasting reference interval . For someone without known diabetes, a glucose value between 100 and 125 mg/dL is consistent with prediabetes and should be confirmed with a follow-up test. .    Hypertension: BP Readings from Last 3 Encounters:  02/17/20 128/70  08/20/19 136/84  08/05/19 116/70   Obesity: Wt Readings from Last 3 Encounters:  02/17/20 182 lb 9.6 oz (82.8 kg)  08/20/19 184 lb (83.5 kg)  08/05/19 184 lb 3.2 oz (83.6 kg)   BMI Readings from Last 3 Encounters:  02/17/20 31.34 kg/m  08/20/19 31.58 kg/m  08/05/19 31.62 kg/m      Advanced Care Planning:  A voluntary discussion about advance care planning including the explanation and discussion of advance directives.   Discussed health care proxy and Living will, and the patient was able to identify a health care proxy as brother, Ardeen Fillers.   Patient does not have a living will at present time.   Social History      She        Social History   Socioeconomic History  . Marital status:  Single    Spouse name: Not on file  . Number of children: 3  . Years of education: Not on file  . Highest education level: Not on file  Occupational History  . Not on file  Tobacco Use  . Smoking status: Former Smoker    Packs/day: 1.00    Years: 22.00    Pack years: 22.00    Types: Cigarettes    Start date: 02/21/1988    Quit date: 02/20/2010    Years since quitting: 9.9  . Smokeless tobacco: Never Used  Vaping Use  . Vaping Use: Never used  Substance and Sexual Activity  . Alcohol use: No  . Drug use: No  . Sexual activity: Not Currently    Birth control/protection: Post-menopausal  Other Topics Concern  . Not on file  Social History Narrative   Widowed   Works full time   Carbonated beverages   Social Determinants of Health   Financial Resource Strain: Low Risk   . Difficulty of Paying Living Expenses: Not hard at all  Food Insecurity: No Food Insecurity  . Worried About Programme researcher, broadcasting/film/video in the Last Year: Never true  . Ran Out of Food in the Last Year: Never true  Transportation Needs: No Transportation Needs  . Lack of Transportation (Medical): No  . Lack of Transportation (Non-Medical): No  Physical Activity: Inactive  . Days of Exercise per Week: 0 days  .  Minutes of Exercise per Session: 0 min  Stress: No Stress Concern Present  . Feeling of Stress : Only a little  Social Connections: Moderately Isolated  . Frequency of Communication with Friends and Family: Once a week  . Frequency of Social Gatherings with Friends and Family: Once a week  . Attends Religious Services: 1 to 4 times per year  . Active Member of Clubs or Organizations: Yes  . Attends Banker Meetings: More than 4 times per year  . Marital Status: Never married    Family History        Family History  Problem Relation Age of Onset  . Diabetes Brother   . Heart attack Mother   . Breast cancer Neg Hx   . Ovarian cancer Neg Hx   . Colon cancer Neg Hx   . Heart disease Neg  Hx     Patient Active Problem List   Diagnosis Date Noted  . 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 Class 1 obesity with serious comorbidity and body mass index (BMI) of 31.0 to 31.9 in adult 05/13/2019  . Family history of early CAD 05/13/2019  . Lung nodule 04/01/2019  . Wheeze 04/01/2019  . Exertional shortness of breath 04/01/2019  . Mediastinal lymphadenopathy 04/01/2019  . Hilar lymphadenopathy 04/01/2019  . Cervical radiculopathy 10/25/2018  . Stable angina pectoris (HCC) 09/30/2018  . Overweight (BMI 25.0-29.9) 04/23/2018  . DDD (degenerative disc disease), lumbar 11/06/2017  . Cardiac murmur 03/16/2017  . Lumbar radiculopathy 03/16/2017  . Prediabetes 07/12/2016  . Osteoarthritis 06/07/2015  . Hyperlipidemia 06/01/2015  . Degenerative arthritis of hip 10/19/2014  . Arthritis of hand, degenerative 10/05/2014  . 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 Moderate mitral insufficiency 05/01/2014    Past Surgical History:  Procedure Laterality Date  . COLONOSCOPY WITH PROPOFOL N/A 05/15/2017   Procedure: COLONOSCOPY WITH PROPOFOL;  Surgeon: Wyline Mood, MD;  [facility] ENDOSCOPY;  Service: Gastroenterology;  Laterality: N/A;  . HAND RECONSTRUCTION Bilateral    as a child     Current Outpatient Medications:  .  albuterol (VENTOLIN HFA) 108 (90 Base) MCG/ACT inhaler, Inhale 2 puffs into the lungs every 4 (four) hours as needed for wheezing or shortness of breath., Disp: 18 g, Rfl: 3 .  atorvastatin (LIPITOR) 20 MG tablet, Take 1 tablet (20 mg total) by mouth at bedtime., Disp: 90 tablet, Rfl: 3 .  Cholecalciferol (VITAMIN D3) 2000 UNITS capsule, Take 2,000 Units by mouth daily. , Disp: , Rfl:  .  naproxen (NAPROSYN) 375 MG tablet, Take 1 tablet (375 mg total) by mouth 2 (two) times daily with a meal., Disp: 20 tablet, Rfl: 0  Allergies  Allergen Reactions  . Tramadol Rash    Patient Care Team: Brenda Berry, PA-C as PCP - General (Family Medicine) Jim Like, RN as  Registered Nurse Scarlett Presto, RN as Registered Nurse Murvin Natal, MD as Referring Physician (Physical Medicine and Rehabilitation) Lamar Blinks, MD as Consulting Physician (Cardiology)  Review of Systems  Constitutional: Negative.   HENT: Negative.   Eyes: Negative.   Respiratory: Positive for shortness of breath. Negative for choking and chest tightness.   Cardiovascular: Negative.   Gastrointestinal: Positive for constipation. Negative for abdominal pain, anal bleeding, diarrhea, nausea and vomiting.  Endocrine: Negative.   Genitourinary: Positive for urgency. Negative for decreased urine volume, difficulty urinating, dyspareunia, dysuria, enuresis, flank pain, frequency, genital sores, hematuria, menstrual problem, pelvic pain, vaginal bleeding, vaginal discharge and vaginal pain.  Musculoskeletal: Negative.  Skin: Negative.  Negative for pallor and rash.  Allergic/Immunologic: Negative.   Neurological: Negative.   Hematological: Negative.   Psychiatric/Behavioral: Negative.   All other systems reviewed and are negative.   10 Systems reviewed and are negative for acute change except as noted in the HPI.   I personally reviewed active problem list, medication list, allergies, family history, social history, health maintenance, notes from last encounter, lab results, imaging with the patient/caregiver today.        Objective:   Vitals:  Vitals:   02/17/20 1450  BP: 128/70  Pulse: 86  Resp: 16  Temp: 97.7 F (36.5 C)  TempSrc: Oral  SpO2: 96%  Weight: 182 lb 9.6 oz (82.8 kg)  Height: 5\' 4"  (1.626 m)    Body mass index is 31.34 kg/m.  Physical Exam Vitals and nursing note reviewed. Exam conducted with a chaperone present.  Constitutional:      General: She is not in acute distress.    Appearance: Normal appearance. She is well-developed. She is obese. She is not ill-appearing, toxic-appearing or diaphoretic.     Interventions: Face mask in place.   HENT:     Head: Normocephalic and atraumatic.     Right Ear: External ear normal.     Left Ear: External ear normal.  Eyes:     General: Lids are normal. No scleral icterus.       Right eye: No discharge.        Left eye: No discharge.     Conjunctiva/sclera: Conjunctivae normal.  Neck:     Thyroid: No thyroid mass, thyromegaly or thyroid tenderness.     Trachea: Trachea and phonation normal. No tracheal deviation.  Cardiovascular:     Rate and Rhythm: Normal rate and regular rhythm.     Pulses: Normal pulses.          Radial pulses are 2+ on the right side and 2+ on the left side.       Posterior tibial pulses are 2+ on the right side and 2+ on the left side.     Heart sounds: Normal heart sounds. No murmur heard. No friction rub. No gallop.   Pulmonary:     Effort: Pulmonary effort is normal. No respiratory distress.     Breath sounds: Normal breath sounds. No stridor. No wheezing, rhonchi or rales.  Chest:     Chest wall: No tenderness.  Abdominal:     General: Bowel sounds are normal. There is no distension.     Palpations: Abdomen is soft.  Genitourinary:    General: Normal vulva.     Vagina: Normal.     Cervix: Normal.     Uterus: Normal.      Adnexa: Right adnexa normal and left adnexa normal.     Comments: Weak pelvic floor and vaginal walls, no organ prolapse Musculoskeletal:     Cervical back: Full passive range of motion without pain and normal range of motion.     Right lower leg: No edema.     Left lower leg: No edema.  Skin:    General: Skin is warm and dry.     Coloration: Skin is not jaundiced or pale.     Findings: No rash.  Neurological:     Mental Status: She is alert.     Motor: No abnormal muscle tone.     Gait: Gait normal.  Psychiatric:        Mood and Affect: Mood normal.  Speech: Speech normal.        Behavior: Behavior normal.       Fall Risk: Fall Risk  02/17/2020 08/05/2019 05/13/2019 02/04/2019 10/25/2018  Falls in the past  year? 0 0 0 0 0  Number falls in past yr: 0 0 0 0 0  Injury with Fall? 0 0 0 0 0  Follow up Falls evaluation completed Falls evaluation completed - - -    Functional Status Survey: Is the patient deaf or have difficulty hearing?: No Does the patient have difficulty seeing, even when wearing glasses/contacts?: No Does the patient have difficulty concentrating, remembering, or making decisions?: No Does the patient have difficulty walking or climbing stairs?: No Does the patient have difficulty dressing or bathing?: No Does the patient have difficulty doing errands alone such as visiting a doctor's office or shopping?: No   Assessment & Plan:    CPE completed today  . USPSTF grade A and B recommendations reviewed with patient; age-appropriate recommendations, preventive care, screening tests, etc discussed and encouraged; healthy living encouraged; see AVS for patient education given to patient  . Discussed importance of 150 minutes of physical activity weekly, AHA exercise recommendations given to pt in AVS/handout  . Discussed importance of healthy diet:  eating lean meats and proteins, avoiding trans fats and saturated fats, avoid simple sugars and excessive carbs in diet, eat 6 servings of fruit/vegetables daily and drink plenty of water and avoid sweet beverages.    . Recommended pt to do annual eye exam and routine dental exams/cleanings  . Depression, alcohol, fall screening completed as documented above and per flowsheets  . Reviewed Health Maintenance: Health Maintenance  Topic Date Due  . PAP SMEAR-Modifier  03/29/2019  . INFLUENZA VACCINE  09/21/2019  . COVID-19 Vaccine (2 - Moderna 3-dose booster series) 03/22/2020 (Originally 12/11/2019)  . MAMMOGRAM  03/19/2020  . COLONOSCOPY (Pts 45-43yrs Insurance coverage will need to be confirmed)  05/16/2027  . TETANUS/TDAP  08/19/2029  . Hepatitis C Screening  Completed  . HIV Screening  Completed     . Immunizations: Immunization History  Administered Date(s) Administered  . Influenza,inj,Quad PF,6+ Mos 11/06/2017  . Influenza-Unspecified 12/22/2018  . Tdap 08/20/2019      ICD-10-CM   1. Adult general medical exam  Z00.00 CBC with Differential/Platelet    COMPLETE METABOLIC PANEL WITH GFR    Lipid panel    Hemoglobin A1c  2. Need for vaccination  Z23 Flu Vaccine QUAD 6+ mos PF IM (Fluarix Quad PF)  3. Encounter for screening mammogram for malignant neoplasm of breast  Z12.31 MM 3D SCREEN BREAST BILATERAL  4. Mixed hyperlipidemia  E78.2 COMPLETE METABOLIC PANEL WITH GFR    Lipid panel   Compliant with statin, no myalgias side effects or concerns, recheck labs today  5. Class 1 obesity with serious comorbidity and body mass index (BMI) of 31.0 to 31.9 in adult, unspecified obesity type  E66.9 COMPLETE METABOLIC PANEL WITH GFR   Z68.31 Lipid panel    Hemoglobin A1c   Weight stable, multiple comorbidities including chronic lung disease, hyperlipidemia, prediabetes  6. Prediabetes  R73.03 COMPLETE METABOLIC PANEL WITH GFR    Hemoglobin A1c   Concerned about bladder and urge incontinence will recheck A1c to ensure no development of diabetes  7. Screening for malignant neoplasm of cervix  Z12.4 Cytology - PAP   PAP and HPV done today, not currently sexually active, last Pap and HPV were negative in 2018  8. Urge incontinence  N39.41  Discussed pelvic floor exercises and possible urology or urogynecology referral  9. RLS (restless legs syndrome)  G25.81    Discussed multiple possible etiologies, encouraged her to take multivitamin, magnesium supplement, be adequately hydrated try B12 supplement follow-up  10. Gastroesophageal reflux disease, unspecified whether esophagitis present  K21.9    Patient endorses vague concern about GERD but is unclear about her symptoms encouraged her to try omeprazole or Prilosec over-the-counter   Unfortunately patient did not get the flu shot  administered today but she was contacted and asked to come back in to clinic when it is convenient for her so she could be given the flu shot    Brenda Berry, PA-C 02/17/20 3:11 PM  [facility]

## 2020-02-18 ENCOUNTER — Telehealth: Payer: Self-pay

## 2020-02-18 LAB — CBC WITH DIFFERENTIAL/PLATELET
Absolute Monocytes: 533 cells/uL (ref 200–950)
Basophils Absolute: 50 cells/uL (ref 0–200)
Basophils Relative: 0.8 %
Eosinophils Absolute: 161 cells/uL (ref 15–500)
Eosinophils Relative: 2.6 %
HCT: 41.2 % (ref 35.0–45.0)
Hemoglobin: 14.1 g/dL (ref 11.7–15.5)
Lymphs Abs: 3478 cells/uL (ref 850–3900)
MCH: 30.3 pg (ref 27.0–33.0)
MCHC: 34.2 g/dL (ref 32.0–36.0)
MCV: 88.4 fL (ref 80.0–100.0)
MPV: 11.5 fL (ref 7.5–12.5)
Monocytes Relative: 8.6 %
Neutro Abs: 1978 cells/uL (ref 1500–7800)
Neutrophils Relative %: 31.9 %
Platelets: 243 10*3/uL (ref 140–400)
RBC: 4.66 10*6/uL (ref 3.80–5.10)
RDW: 12.5 % (ref 11.0–15.0)
Total Lymphocyte: 56.1 %
WBC: 6.2 10*3/uL (ref 3.8–10.8)

## 2020-02-18 LAB — COMPLETE METABOLIC PANEL WITH GFR
AG Ratio: 1.7 (calc) (ref 1.0–2.5)
ALT: 31 U/L — ABNORMAL HIGH (ref 6–29)
AST: 32 U/L (ref 10–35)
Albumin: 4.8 g/dL (ref 3.6–5.1)
Alkaline phosphatase (APISO): 120 U/L (ref 37–153)
BUN: 19 mg/dL (ref 7–25)
CO2: 28 mmol/L (ref 20–32)
Calcium: 10.3 mg/dL (ref 8.6–10.4)
Chloride: 104 mmol/L (ref 98–110)
Creat: 0.88 mg/dL (ref 0.50–1.05)
GFR, Est African American: 85 mL/min/{1.73_m2} (ref >=60)
GFR, Est Non African American: 73 mL/min/{1.73_m2} (ref >=60)
Globulin: 2.8 g/dL (calc) (ref 1.9–3.7)
Glucose, Bld: 98 mg/dL (ref 65–99)
Potassium: 4.4 mmol/L (ref 3.5–5.3)
Sodium: 139 mmol/L (ref 135–146)
Total Bilirubin: 0.6 mg/dL (ref 0.2–1.2)
Total Protein: 7.6 g/dL (ref 6.1–8.1)

## 2020-02-18 LAB — LIPID PANEL
Cholesterol: 176 mg/dL (ref <200)
HDL: 57 mg/dL (ref >=50)
LDL Cholesterol (Calc): 95 mg/dL (calc)
Non-HDL Cholesterol (Calc): 119 mg/dL (calc) (ref <130)
Total CHOL/HDL Ratio: 3.1 (calc) (ref <5.0)
Triglycerides: 138 mg/dL (ref <150)

## 2020-02-18 LAB — HEMOGLOBIN A1C
Hgb A1c MFr Bld: 7.1 % of total Hgb — ABNORMAL HIGH (ref <5.7)
Mean Plasma Glucose: 157 mg/dL
eAG (mmol/L): 8.7 mmol/L

## 2020-02-18 NOTE — Telephone Encounter (Signed)
Copied from CRM [id number]. Topic: General - Other >> Feb 18, 2020  3:06 PM Jaquita Rector A wrote: Reason for CRM: Patient would like a call back with her lab results. Please call Ph# [phone]

## 2020-02-19 ENCOUNTER — Encounter: Payer: Self-pay | Admitting: Emergency Medicine

## 2020-02-19 DIAGNOSIS — E119 Type 2 diabetes mellitus without complications: Secondary | ICD-10-CM | POA: Insufficient documentation

## 2020-02-19 NOTE — Telephone Encounter (Signed)
Patient notified of labs.   

## 2020-02-23 LAB — CYTOLOGY - PAP
Comment: NEGATIVE
Diagnosis: NEGATIVE
High risk HPV: POSITIVE — AB

## 2020-03-16 ENCOUNTER — Encounter: Payer: Self-pay | Admitting: Family Medicine

## 2020-03-16 ENCOUNTER — Other Ambulatory Visit: Payer: Self-pay

## 2020-03-16 ENCOUNTER — Ambulatory Visit (INDEPENDENT_AMBULATORY_CARE_PROVIDER_SITE_OTHER): Payer: BLUE CROSS/BLUE SHIELD | Admitting: Family Medicine

## 2020-03-16 VITALS — BP 118/62 | HR 63 | Temp 97.5°F | Resp 16 | Ht 64.0 in | Wt 187.7 lb

## 2020-03-16 DIAGNOSIS — E782 Mixed hyperlipidemia: Secondary | ICD-10-CM

## 2020-03-16 DIAGNOSIS — K219 Gastro-esophageal reflux disease without esophagitis: Secondary | ICD-10-CM | POA: Diagnosis not present

## 2020-03-16 DIAGNOSIS — Z23 Encounter for immunization: Secondary | ICD-10-CM

## 2020-03-16 DIAGNOSIS — E119 Type 2 diabetes mellitus without complications: Secondary | ICD-10-CM

## 2020-03-16 MED ORDER — PANTOPRAZOLE SODIUM 20 MG PO TBEC
20.0000 mg | DELAYED_RELEASE_TABLET | ORAL | 0 refills | Status: DC
Start: 2020-03-16 — End: 2020-03-19

## 2020-03-16 MED ORDER — ATORVASTATIN CALCIUM 20 MG PO TABS: 20.0000 mg | ORAL_TABLET | Freq: Every day | ORAL | 3 refills | Status: DC

## 2020-03-16 NOTE — Progress Notes (Signed)
Patient ID: Brenda Ellison, female    DOB: 31-Oct-1960, 60 y.o.   MRN: [id number]  PCP: Danelle Berry, PA-C  Chief Complaint  Patient presents with  . Diabetes    New Onset, abnormal labs    Subjective:   Brenda Ellison is a 60 y.o. female, presents to clinic with CC of the following:  HPI  Pt presents here for f/up on new onset DM - found with labs last month  Her BP is softer than her normal and pulse ox is low  - improved when pt was ambulated and VS repeated today  New onset DM DM:   Not currently on meds Denies: Polyuria, polydipsia, vision changes, neuropathy, hypoglycemia Recent pertinent labs: Lab Results  Component Value Date   HGBA1C 7.1 (H) 02/17/2020   HGBA1C 6.3 (H) 02/04/2019   HGBA1C 6.1 (H) 04/23/2018   Standard of care and health maintenance: Urine Microalbumin:  due Foot exam:  Due DM eye exam:  due ACEI/ARB:  Not on currently BP soft Statin:  On statin       Patient Active Problem List   Diagnosis Date Noted  . Type 2 diabetes mellitus without complications (HCC) 02/19/2020  . Allergic rhinitis 02/17/2020  . Thoracic aortic aneurysm without rupture (HCC) 09/23/2019  . 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 Class 1 obesity with serious comorbidity and body mass index (BMI) of 31.0 to 31.9 in adult 05/13/2019  . Family history of early CAD 05/13/2019  . Lung nodule 04/01/2019  . Wheeze 04/01/2019  . Exertional shortness of breath 04/01/2019  . Mediastinal lymphadenopathy 04/01/2019  . Hilar lymphadenopathy 04/01/2019  . Cervical radiculopathy 10/25/2018  . Stable angina pectoris (HCC) 09/30/2018  . Overweight (BMI 25.0-29.9) 04/23/2018  . DDD (degenerative disc disease), lumbar 11/06/2017  . Cardiac murmur 03/16/2017  . Lumbar radiculopathy 03/16/2017  . Prediabetes 07/12/2016  . Osteoarthritis 06/07/2015  . Hyperlipidemia 06/01/2015  . Urticaria 11/30/2014  . Degenerative arthritis of hip 10/19/2014  . Arthritis of hand, degenerative  10/05/2014  . 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 Moderate mitral insufficiency 05/01/2014      Current Outpatient Medications:  .  albuterol (VENTOLIN HFA) 108 (90 Base) MCG/ACT inhaler, Inhale 2 puffs into the lungs every 4 (four) hours as needed for wheezing or shortness of breath., Disp: 18 g, Rfl: 3 .  atorvastatin (LIPITOR) 20 MG tablet, Take 1 tablet (20 mg total) by mouth at bedtime., Disp: 90 tablet, Rfl: 3 .  Cholecalciferol (VITAMIN D3) 2000 UNITS capsule, Take 2,000 Units by mouth daily.  (Patient not taking: Reported on 03/16/2020), Disp: , Rfl:  .  naproxen (NAPROSYN) 375 MG tablet, Take 1 tablet (375 mg total) by mouth 2 (two) times daily with a meal. (Patient not taking: Reported on 03/16/2020), Disp: 20 tablet, Rfl: 0   Allergies  Allergen Reactions  . Tramadol Rash     Social History   Tobacco Use  . Smoking status: Former Smoker    Packs/day: 1.00    Years: 22.00    Pack years: 22.00    Types: Cigarettes    Start date: 02/21/1988    Quit date: 02/20/2010    Years since quitting: 10.0  . Smokeless tobacco: Never Used  Vaping Use  . Vaping Use: Never used  Substance Use Topics  . Alcohol use: No  . Drug use: No      Chart Review Today: I personally reviewed active problem list, medication list, allergies, family history, social history, health maintenance, notes  from last encounter, lab results, imaging with the patient/caregiver today.   Review of Systems  Constitutional: Negative.   HENT: Negative.   Eyes: Negative.   Respiratory: Negative.   Cardiovascular: Negative.   Gastrointestinal: Negative.   Endocrine: Negative.   Genitourinary: Negative.   Musculoskeletal: Negative.   Skin: Negative.   Allergic/Immunologic: Negative.   Neurological: Negative.   Hematological: Negative.   Psychiatric/Behavioral: Negative.   All other systems reviewed and are negative.      Objective:   Vitals:   03/16/20 1452 03/16/20 1505  BP: 98/60 118/62  Pulse: 81  63  Resp: 16   Temp: (!) 97.5 F (36.4 C)   SpO2: 91% 96%  Weight: 187 lb 11.2 oz (85.1 kg)   Height: 5\' 4"  (1.626 m)     Body mass index is 32.22 kg/m.  Physical Exam Vitals and nursing note reviewed.  Constitutional:      General: She is not in acute distress.    Appearance: Normal appearance. She is obese. She is not ill-appearing, toxic-appearing or diaphoretic.  HENT:     Head: Normocephalic and atraumatic.     Right Ear: External ear normal.     Left Ear: External ear normal.  Eyes:     General:        Right eye: No discharge.        Left eye: No discharge.     Conjunctiva/sclera: Conjunctivae normal.  Cardiovascular:     Rate and Rhythm: Normal rate and regular rhythm.     Pulses: Normal pulses.     Heart sounds: Normal heart sounds.  Pulmonary:     Effort: Pulmonary effort is normal.     Breath sounds: Normal breath sounds.  Abdominal:     General: Bowel sounds are normal. There is no distension.     Palpations: Abdomen is soft.     Tenderness: There is no abdominal tenderness.  Skin:    General: Skin is warm and dry.     Coloration: Skin is not jaundiced or pale.  Neurological:     Mental Status: She is alert. Mental status is at baseline.     Gait: Gait normal.  Psychiatric:        Mood and Affect: Mood normal.        Behavior: Behavior normal.      Diabetic Foot Exam - Simple   Simple Foot Form Diabetic Foot exam was performed with the following findings: Yes 03/16/2020  3:00 PM  Visual Inspection Sensation Testing Pulse Check Comments         Assessment & Plan:   1. New onset type 2 diabetes mellitus (HCC) Reviewed labs, pt educated about DM and standard of care Spoke with pt about new diagnosis.  Discussed A1C results with them and explained what an A1C is, basic pathophysiology of DM Type 2, basic home care, basic diabetes diet nutrition principles, importance of checking CBGs and maintaining good CBG control to prevent long-term and  short-term complications.  Reviewed signs and symptoms of hyperglycemia and hypoglycemia and how to treat hypoglycemia at home.  Also reviewed blood sugar goals and A1c goals for home.   Soft BP today hesitated to start ACEI/ARB - will recheck at next appt and see if we can add then  - Microalbumin, urine - Ambulatory referral to Ophthalmology - needs DM eye exam  2. Mixed hyperlipidemia Compliant with meds, no SE, no myalgias, fatigue or jaundice Diet and exercise recommendations reviewed  - atorvastatin (LIPITOR)  20 MG tablet; Take 1 tablet (20 mg total) by mouth at bedtime.  Dispense: 90 tablet; Refill: 3  3. Gastroesophageal reflux disease, unspecified whether esophagitis present Sx are improving with avoiding food triggers and taking protonix  4. Need for pneumococcal vaccination done - Pneumococcal polysaccharide vaccine 23-valent greater than or equal to 2yo subcutaneous/IM  5. Need for influenza vaccination done - Flu Vaccine QUAD 6+ mos PF IM (Fluarix Quad PF)   Spent more than 30 min with patient today reviewing labs and providing education. An additional 10 for chart documentation 5+ for chart review   Danelle Berry, Cordelia Poche 03/16/20 2:59 PM

## 2020-03-16 NOTE — Patient Instructions (Addendum)
Hemoglobin A1c  Result Value Ref Range   Hgb A1c MFr Bld 7.1 (H) <5.7 % of total Hgb    Health Maintenance  Topic Date Due  . Pneumococcal vaccine  Never done  . Complete foot exam   Never done  . Eye exam for diabetics  Never done  . Urine Protein Check  Never done  . Flu Shot  09/21/2019  . COVID-19 Vaccine (2 - Moderna 3-dose series) 03/22/2020*  . Mammogram  03/19/2020  . Hemoglobin A1C  08/17/2020  . Pap Smear  02/17/2023  . Colon Cancer Screening  05/16/2027  . Tetanus Vaccine  08/19/2029  .  Hepatitis C: One time screening is recommended by Center for Disease Control  (CDC) for  adults born from [birth year] through 1965.   Completed  . HIV Screening  Completed  *Topic was postponed. The date shown is not the original due date.       Diabetes Mellitus Basics  Diabetes mellitus, or diabetes, is a long-term (chronic) disease. It occurs when the body does not properly use sugar (glucose) that is released from food after you eat. Diabetes mellitus may be caused by one or both of these problems:  Your pancreas does not make enough of a hormone called insulin.  Your body does not react in a normal way to the insulin that it makes. Insulin lets glucose enter cells in your body. This gives you energy. If you have diabetes, glucose cannot get into cells. This causes high blood glucose (hyperglycemia). How to treat and manage diabetes You may need to take insulin or other diabetes medicines daily to keep your glucose in balance. If you are prescribed insulin, you will learn how to give yourself insulin by injection. You may need to adjust the amount of insulin you take based on the foods that you eat. You will need to check your blood glucose levels using a glucose monitor as told by your health care provider. The readings can help determine if you have low or high blood glucose. Generally, you should have these blood glucose levels:  Before meals (preprandial): 80-130 mg/dL (9.8-9.2  mmol/L).  After meals (postprandial): below 180 mg/dL (10 mmol/L).  Hemoglobin A1c (HbA1c) level: less than 7%. Your health care provider will set treatment goals for you. Keep all follow-up visits. This is important. Follow these instructions at home: Diabetes medicines Take your diabetes medicines every day as told by your health care provider. List your diabetes medicines here:  Name of medicine: ______________________________ ? Amount (dose): _______________ Time (a.m./p.m.): _______________ Notes: ___________________________________  Name of medicine: ______________________________ ? Amount (dose): _______________ Time (a.m./p.m.): _______________ Notes: ___________________________________  Name of medicine: ______________________________ ? Amount (dose): _______________ Time (a.m./p.m.): _______________ Notes: ___________________________________ Insulin If you use insulin, list the types of insulin you use here:  Insulin type: ______________________________ ? Amount (dose): _______________ Time (a.m./p.m.): _______________Notes: ___________________________________  Insulin type: ______________________________ ? Amount (dose): _______________ Time (a.m./p.m.): _______________ Notes: ___________________________________  Insulin type: ______________________________ ? Amount (dose): _______________ Time (a.m./p.m.): _______________ Notes: ___________________________________  Insulin type: ______________________________ ? Amount (dose): _______________ Time (a.m./p.m.): _______________ Notes: ___________________________________  Insulin type: ______________________________ ? Amount (dose): _______________ Time (a.m./p.m.): _______________ Notes: ___________________________________ Managing blood glucose Check your blood glucose levels using a glucose monitor as told by your health care provider. Write down the times that you check your glucose levels here:  Time:  _______________ Notes: ___________________________________  Time: _______________ Notes: ___________________________________  Time: _______________ Notes: ___________________________________  Time: _______________ Notes: ___________________________________  Time: _______________ Notes: ___________________________________  Time: _______________ Notes: ___________________________________   Low blood glucose Low blood glucose (hypoglycemia) is when glucose is at or below 70 mg/dL (3.9 mmol/L). Symptoms may include:  Feeling: ? Hungry. ? Sweaty and clammy. ? Irritable or easily upset. ? Dizzy. ? Sleepy.  Having: ? A fast heartbeat. ? A headache. ? A change in your vision. ? Numbness around the mouth, lips, or tongue.  Having trouble with: ? Moving (coordination). ? Sleeping. Treating low blood glucose To treat low blood glucose, eat or drink something containing sugar right away. If you can think clearly and swallow safely, follow the 15:15 rule:  Take 15 grams of a fast-acting carb (carbohydrate), as told by your health care provider.  Some fast-acting carbs are: ? Glucose tablets: take 3-4 tablets. ? Hard candy: eat 3-5 pieces. ? Fruit juice: drink 4 oz (120 mL). ? Regular (not diet) soda: drink 4-6 oz (120-180 mL). ? Honey or sugar: eat 1 Tbsp (15 mL).  Check your blood glucose levels 15 minutes after you take the carb.  If your glucose is still at or below 70 mg/dL (3.9 mmol/L), take 15 grams of a carb again.  If your glucose does not go above 70 mg/dL (3.9 mmol/L) after 3 tries, get help right away.  After your glucose goes back to normal, eat a meal or a snack within 1 hour. Treating very low blood glucose If your glucose is at or below 54 mg/dL (3 mmol/L), you have very low blood glucose (severe hypoglycemia). This is an emergency. Do not wait to see if the symptoms will go away. Get medical help right away. Call your local emergency services (911 in the [place]  Do not drive yourself to the hospital. Questions to ask your health care provider  Should I talk with a diabetes educator?  What equipment will I need to care for myself at home?  What diabetes medicines do I need? When should I take them?  How often do I need to check my blood glucose levels?  What number can I call if I have questions?  When is my follow-up visit?  Where can I find a support group for people with diabetes? Where to find more information  American Diabetes Association: www.diabetes.org  Association of Diabetes Care and Education Specialists: www.diabeteseducator.org Contact a health care provider if:  Your blood glucose is at or above 240 mg/dL (41.6 mmol/L) for 2 days in a row.  You have been sick or have had a fever for 2 days or more, and you are not getting better.  You have any of these problems for more than 6 hours: ? You cannot eat or drink. ? You feel nauseous. ? You vomit. ? You have diarrhea. Get help right away if:  Your blood glucose is lower than 54 mg/dL (3 mmol/L).  You get confused.  You have trouble thinking clearly.  You have trouble breathing. These symptoms may represent a serious problem that is an emergency. Do not wait to see if the symptoms will go away. Get medical help right away. Call your local emergency services (911 in the [place] Do not drive yourself to the hospital. Summary  Diabetes mellitus is a chronic disease that occurs when the body does not properly use sugar (glucose) that is released from food after you eat.  Take insulin and diabetes medicines as told.  Check your blood glucose every day, as often as told.  Keep all follow-up visits. This is important. This information is not intended  to replace advice given to you by your health care provider. Make sure you discuss any questions you have with your health care provider. Document Revised: 06/10/2019 Document Reviewed: 06/10/2019 Elsevier Patient Education   2021 [employer].

## 2020-03-17 LAB — MICROALBUMIN, URINE: Microalb, Ur: 1 mg/dL

## 2020-03-18 ENCOUNTER — Telehealth: Payer: Self-pay

## 2020-03-18 DIAGNOSIS — K219 Gastro-esophageal reflux disease without esophagitis: Secondary | ICD-10-CM | POA: Insufficient documentation

## 2020-03-18 NOTE — Telephone Encounter (Signed)
Copied from CRM [id number]. Topic: Referral - Status >> Mar 18, 2020 11:55 AM Marylen Ponto wrote: Reason for CRM: Pt states that a new referral is needed in order for her to schedule an appt for bone density test

## 2020-03-19 ENCOUNTER — Other Ambulatory Visit: Payer: Self-pay | Admitting: Family Medicine

## 2020-03-19 MED ORDER — PANTOPRAZOLE SODIUM 20 MG PO TBEC: 20.0000 mg | DELAYED_RELEASE_TABLET | ORAL | 0 refills | Status: DC

## 2020-03-19 NOTE — Telephone Encounter (Signed)
Patient notified will discuss at next visit

## 2020-03-19 NOTE — Telephone Encounter (Signed)
Change in pharmacy- resent to new pharmacy

## 2020-03-19 NOTE — Telephone Encounter (Signed)
Did you discuss with this patient for a bone density. Did not see in chart

## 2020-03-19 NOTE — Telephone Encounter (Signed)
Pt would like to know if you will re send her pantoprazole (PROTONIX) 20 MG tablet  to   [id number] - [place] ([place]), [place] - [address]  She says the other [facility] is too far from her home

## 2020-03-23 ENCOUNTER — Other Ambulatory Visit: Payer: Self-pay

## 2020-03-23 ENCOUNTER — Ambulatory Visit
Admission: RE | Admit: 2020-03-23 | Discharge: 2020-03-23 | Disposition: A | Discharge status: Home / Self Care | Payer: BLUE CROSS/BLUE SHIELD | Source: Ambulatory Visit | Attending: Family Medicine | Admitting: Family Medicine

## 2020-03-23 DIAGNOSIS — Z1231 Encounter for screening mammogram for malignant neoplasm of breast: Secondary | ICD-10-CM | POA: Diagnosis present

## 2020-04-14 ENCOUNTER — Ambulatory Visit: Payer: Self-pay

## 2020-04-14 ENCOUNTER — Telehealth: Payer: Self-pay

## 2020-04-14 NOTE — Telephone Encounter (Signed)
Copied from CRM [id number]. Topic: General - Call Back - No Documentation >> Apr 14, 2020  3:29 PM Randol Kern wrote: Reason for CRM: Pt is requesting a call back regarding her recent lab work and diabetes diagnosis Best contact: [phone]

## 2020-04-14 NOTE — Telephone Encounter (Signed)
Agent was going to transfer pt. To be triaged and pt. Disconnected. Called pt. Back and left message to call back.

## 2020-04-15 NOTE — Telephone Encounter (Signed)
Pt.notified

## 2020-04-26 ENCOUNTER — Ambulatory Visit: Payer: Self-pay

## 2020-04-26 NOTE — Telephone Encounter (Signed)
Please schedule patient appointment

## 2020-04-26 NOTE — Telephone Encounter (Signed)
lvm to schedule an appt 

## 2020-04-26 NOTE — Telephone Encounter (Signed)
Pt. Reports Saturday at an [facility] her BP was 170/101. Today at work it is 151/87. Has mild dizziness at times. Practice is currently closed for lunch. No availability with her PCP tomorrow. Please advise. Lost connection with pt. Message left that the practice will call her back.  Reason for Disposition . Systolic BP  >= 180 OR Diastolic >= 110  Answer Assessment - Initial Assessment Questions 1. BLOOD PRESSURE: "What is the blood pressure?" "Did you take at least two measurements 5 minutes apart?"     170/101 Saturday 2. ONSET: "When did you take your blood pressure?"     Last week 3. HOW: "How did you obtain the blood pressure?" (e.g., visiting nurse, automatic home BP monitor)     151/87 at work 4. HISTORY: "Do you have a history of high blood pressure?"     No 5. MEDICATIONS: "Are you taking any medications for blood pressure?" "Have you missed any doses recently?"     No medicine 6. OTHER SYMPTOMS: "Do you have any symptoms?" (e.g., headache, chest pain, blurred vision, difficulty breathing, weakness)     Some dizziness 7. PREGNANCY: "Is there any chance you are pregnant?" "When was your last menstrual period?"     No  Protocols used: BLOOD PRESSURE - HIGH-A-AH

## 2020-04-26 NOTE — Telephone Encounter (Signed)
Pt needs appt or can go back to urgent care

## 2020-04-27 ENCOUNTER — Telehealth: Payer: Self-pay

## 2020-04-27 NOTE — Telephone Encounter (Signed)
Patient called back and at the time we have no avail appts for 2 weeks. Advised her to go to the urgent care of her choice since we have no availability.

## 2020-05-17 ENCOUNTER — Telehealth: Payer: Self-pay | Admitting: Family Medicine

## 2020-05-17 NOTE — Telephone Encounter (Signed)
Pt is calling to see if Brenda Carls Just, NP:?has an appt on Thursday 05/27/20 for both foot pain. Please advice CB- [phone]

## 2020-05-17 NOTE — Telephone Encounter (Signed)
No vm set up. 

## 2020-05-24 ENCOUNTER — Telehealth: Payer: Self-pay | Admitting: *Deleted

## 2020-05-24 NOTE — Telephone Encounter (Signed)
Apologies- sent to wrong office- redirected to correct office

## 2020-05-24 NOTE — Telephone Encounter (Signed)
Tried to call patient, I dont see that she needs will discuss at next visit

## 2020-05-24 NOTE — Telephone Encounter (Signed)
Patient is calling with question: Patient noticed she was due Bone Density on her after visit summery and wants to know if she needs to have it done before her next appointment. Explained what Bone Density is and why the scan is done. Patient wants to know if she needs that now or does the office schedule that- do not see order in computer- so advised patient would send message to PCP for response.  Briefly discussed recent diabetes diagnosis and what to avoid in diet- what foods contains carbohydrates and portion control.

## 2020-06-15 ENCOUNTER — Ambulatory Visit (INDEPENDENT_AMBULATORY_CARE_PROVIDER_SITE_OTHER): Payer: Self-pay | Admitting: Family Medicine

## 2020-06-15 ENCOUNTER — Other Ambulatory Visit: Payer: Self-pay

## 2020-06-15 ENCOUNTER — Encounter: Payer: Self-pay | Admitting: Family Medicine

## 2020-06-15 VITALS — BP 120/64 | HR 96 | Temp 98.2°F | Resp 14 | Ht 63.0 in | Wt 183.1 lb

## 2020-06-15 DIAGNOSIS — E782 Mixed hyperlipidemia: Secondary | ICD-10-CM

## 2020-06-15 DIAGNOSIS — E119 Type 2 diabetes mellitus without complications: Secondary | ICD-10-CM

## 2020-06-15 DIAGNOSIS — K219 Gastro-esophageal reflux disease without esophagitis: Secondary | ICD-10-CM

## 2020-06-15 LAB — POCT GLYCOSYLATED HEMOGLOBIN (HGB A1C): Hemoglobin A1C: 6.9 % — AB (ref 4.0–5.6)

## 2020-06-15 NOTE — Progress Notes (Signed)
Name: Brenda Ellison   MRN: [id number]    DOB: Jun 24, 1960   Date:06/15/2020       Progress Note  Chief Complaint  Patient presents with   Diabetes   Gastroesophageal Reflux   Hyperlipidemia     Subjective:   Brenda Ellison is a 60 y.o. female, presents to clinic for f/up on new onset T2DM  Lab Results  Component Value Date   HGBA1C 7.1 (H) 02/17/2020   HGBA1C 6.3 (H) 02/04/2019   HGBA1C 6.1 (H) 04/23/2018   Standard of care and health maintenance: Urine Microalbumin:  done Foot exam:  due DM eye exam:  ordered ACEI/ARB:  Not taking Statin:  On lipitor  BP Readings from Last 3 Encounters:  06/15/20 120/64  03/16/20 118/62  02/17/20 128/70   Lab Results  Component Value Date   CHOL 176 02/17/2020   HDL 57 02/17/2020   LDLCALC 95 02/17/2020   TRIG 138 02/17/2020   CHOLHDL 3.1 02/17/2020   Lab Results  Component Value Date   ALT 31 (H) 02/17/2020   AST 32 02/17/2020   ALKPHOS 147 (H) 11/09/2015   BILITOT 0.6 02/17/2020   F/up on abd pain/indigestion -  Given pantoprazole 20 mg to take one daily, sx have improved      Current Outpatient Medications:    albuterol (VENTOLIN HFA) 108 (90 Base) MCG/ACT inhaler, Inhale 2 puffs into the lungs every 4 (four) hours as needed for wheezing or shortness of breath., Disp: 18 g, Rfl: 3   atorvastatin (LIPITOR) 20 MG tablet, Take 1 tablet (20 mg total) by mouth at bedtime., Disp: 90 tablet, Rfl: 3   celecoxib (CELEBREX) 100 MG capsule, Take 100 mg by mouth 2 (two) times daily., Disp: , Rfl:    Cholecalciferol (VITAMIN D3) 2000 UNITS capsule, Take 2,000 Units by mouth daily., Disp: , Rfl:    pantoprazole (PROTONIX) 20 MG tablet, Take 1 tablet (20 mg total) by mouth every morning. One hour before breakfast, Disp: 30 tablet, Rfl: 0  Patient Active Problem List   Diagnosis Date Noted   Gastroesophageal reflux disease 03/18/2020   New onset type 2 diabetes mellitus (HCC) 02/19/2020   Allergic rhinitis 02/17/2020    Thoracic aortic aneurysm without rupture (HCC) 09/23/2019   Small airways disease 05/13/2019   Class 1 obesity with serious comorbidity and body mass index (BMI) of 31.0 to 31.9 in adult 05/13/2019   Family history of early CAD 05/13/2019   Lung nodule 04/01/2019   Wheeze 04/01/2019   Exertional shortness of breath 04/01/2019   Mediastinal lymphadenopathy 04/01/2019   Hilar lymphadenopathy 04/01/2019   Cervical radiculopathy 10/25/2018   Stable angina pectoris (HCC) 09/30/2018   Overweight (BMI 25.0-29.9) 04/23/2018   DDD (degenerative disc disease), lumbar 11/06/2017   Cardiac murmur 03/16/2017   Lumbar radiculopathy 03/16/2017   Prediabetes 07/12/2016   Osteoarthritis 06/07/2015   Hyperlipidemia 06/01/2015   Urticaria 11/30/2014   Degenerative arthritis of hip 10/19/2014   Arthritis of hand, degenerative 10/05/2014   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   Moderate mitral insufficiency 05/01/2014    Past Surgical History:  Procedure Laterality Date   COLONOSCOPY WITH PROPOFOL N/A 05/15/2017   Procedure: COLONOSCOPY WITH PROPOFOL;  Surgeon: Wyline Mood, MD;  Location: [facility] ENDOSCOPY;  Service: Gastroenterology;  Laterality: N/A;   HAND RECONSTRUCTION Bilateral    as a child    Family History  Problem Relation Age of Onset   Diabetes Brother    Heart attack Mother    Breast cancer Neg  Hx    Ovarian cancer Neg Hx    Colon cancer Neg Hx    Heart disease Neg Hx     Social History   Tobacco Use   Smoking status: Former Smoker    Packs/day: 1.00    Years: 22.00    Pack years: 22.00    Types: Cigarettes    Start date: 02/21/1988    Quit date: 02/20/2010    Years since quitting: 10.3   Smokeless tobacco: Never Used  Vaping Use   Vaping Use: Never used  Substance Use Topics   Alcohol use: No   Drug use: No     Allergies  Allergen Reactions   Tramadol Rash    Health Maintenance  Topic Date Due   OPHTHALMOLOGY EXAM  Never done   COVID-19 Vaccine (2 - Moderna 3-dose series)  12/11/2019   HEMOGLOBIN A1C  08/17/2020   INFLUENZA VACCINE  09/20/2020   FOOT EXAM  03/16/2021   URINE MICROALBUMIN  03/16/2021   MAMMOGRAM  03/23/2021   PAP SMEAR-Modifier  02/17/2023   COLONOSCOPY (Pts 45-58yrs Insurance coverage will need to be confirmed)  05/16/2027   TETANUS/TDAP  08/19/2029   PNEUMOCOCCAL POLYSACCHARIDE VACCINE AGE 64-64 HIGH RISK  Completed   Hepatitis C Screening  Completed   HIV Screening  Completed   HPV VACCINES  Aged Out    Chart Review Today: I personally reviewed active problem list, medication list, allergies, family history, social history, health maintenance, notes from last encounter, lab results, imaging with the patient/caregiver today.   Review of Systems  Constitutional: Negative.   HENT: Negative.    Eyes: Negative.   Respiratory: Negative.    Cardiovascular: Negative.   Gastrointestinal: Negative.   Endocrine: Negative.   Genitourinary: Negative.   Musculoskeletal: Negative.   Skin: Negative.   Allergic/Immunologic: Negative.   Neurological: Negative.   Hematological: Negative.   Psychiatric/Behavioral: Negative.    All other systems reviewed and are negative.   Objective:   Vitals:   06/15/20 1342  BP: 120/64  Pulse: 96  Resp: 14  Temp: 98.2 F (36.8 C)  SpO2: 96%  Weight: 183 lb 1.6 oz (83.1 kg)  Height: 5\' 3"  (1.6 m)    Body mass index is 32.43 kg/m.  Physical Exam Vitals and nursing note reviewed.  Constitutional:      General: She is not in acute distress.    Appearance: Normal appearance. She is obese. She is not ill-appearing, toxic-appearing or diaphoretic.  HENT:     Head: Normocephalic and atraumatic.     Right Ear: External ear normal.     Left Ear: External ear normal.  Eyes:     General:        Right eye: No discharge.        Left eye: No discharge.     Conjunctiva/sclera: Conjunctivae normal.  Cardiovascular:     Rate and Rhythm: Normal rate and regular rhythm.     Pulses: Normal pulses.      Heart sounds: Normal heart sounds.  Pulmonary:     Effort: Pulmonary effort is normal.     Breath sounds: Normal breath sounds.  Abdominal:     General: Bowel sounds are normal. There is no distension.     Palpations: Abdomen is soft.     Tenderness: There is no abdominal tenderness.  Musculoskeletal:     Right lower leg: No edema.     Left lower leg: No edema.  Skin:    General: Skin is  warm and dry.     Capillary Refill: Capillary refill takes less than 2 seconds.     Coloration: Skin is not jaundiced or pale.     Findings: No lesion or rash.  Neurological:     Mental Status: She is alert.     Gait: Gait normal.  Psychiatric:        Mood and Affect: Mood normal.        Assessment & Plan:     ICD-10-CM   1. New onset type 2 diabetes mellitus (HCC)  E11.9 POCT HgB A1C   working on diet with new onset DM, recheck A1C if above goal will start metformin, on lipitor, needs acei/arb, foot exam done, due for dm eye exam    2. Mixed hyperlipidemia  E78.2    on statin, good compliance, labs recently rechecked and at goal, continue lipitor 20 mg    3. Gastroesophageal reflux disease, unspecified whether esophagitis present  K21.9    improved GI sx with ppi - wean off, continue to work on diet/lifestyle efforts, can use pepcid bid prn        Return for needs to establish with PCP covered by insurance.   Danelle Berry, PA-C 06/15/20 2:00 PM

## 2020-06-15 NOTE — Patient Instructions (Signed)
I referred you to an eye doctor back in January to get a diabetic eye exam done   Please contact your insurance to find what primary care or internal medicine practices are in network and accepting new patients - we would love to continue to care for you, but do not want to cause you to have high medical bills.  Health Maintenance  Topic Date Due  . Eye exam for diabetics  Never done  . COVID-19 Vaccine (2 - Moderna 3-dose series) 12/11/2019  . Hemoglobin A1C  08/17/2020  . Flu Shot  09/20/2020  . Complete foot exam   03/16/2021  . Urine Protein Check  03/16/2021  . Mammogram  03/23/2021  . Pap Smear  02/17/2023  . Colon Cancer Screening  05/16/2027  . Tetanus Vaccine  08/19/2029  . Pneumococcal vaccine  Completed  .  Hepatitis C: One time screening is recommended by Center for Disease Control  (CDC) for  adults born from [birth year] through 1963.   Completed  . HIV Screening  Completed  . HPV Vaccine  Aged Out    Type 2 Diabetes Mellitus, Self-Care, Adult When you have type 2 diabetes (type 2 diabetes mellitus), you must make sure your blood sugar (glucose) stays in a healthy range. You can do this with:  Nutrition.  Exercise.  Lifestyle changes.  Medicines or insulin, if needed.  Support from your doctors and others. What are the risks? Having diabetes can raise your risk for other long-term (chronic) health problems. You may get medicines to help prevent these problems. How to stay aware of blood sugar  Check your blood sugar level every day, as often as told.  Have your A1C (hemoglobin A1C) level checked two or more times a year. Have it checked more often if told.  Your doctor will set personal treatment goals for you. In general, you should have these blood sugar levels: ? Before meals: 80-130 mg/dL (4.4-7.2 mmol/L). ? After meals: below 180 mg/dL (10 mmol/L). ? A1C: less than 7%.   How to manage high and low blood sugar Symptoms of high blood sugar High blood sugar is  also called hyperglycemia. Know the symptoms of high blood sugar. These may include:  More thirst.  Hunger.  Feeling very tired.  Needing to pee (urinate) more often than normal.  Seeing things blurry. Symptoms of low blood sugar Low blood sugar is also called hypoglycemia. This is when blood sugar is at or below 70 mg/dL (3.9 mmol/L). Symptoms may include:  Hunger.  Feeling worried or nervous (anxious).  Feeling sweaty and cold to the touch (clammy).  Being dizzy or light-headed.  Feeling sleepy.  A fast heartbeat.  Feeling grouchy (irritable).  Tingling or loss of feeling (numbness) around your mouth, lips, or tongue.  Restless sleep. Diabetes medicines can cause low blood sugar. You are more at risk:  While you exercise.  After exercise.  During sleep.  When you are sick.  When you skip meals or do not eat for a long time. Treating low blood sugar If you think you have low blood sugar, eat or drink something sugary right away. Keep 15 grams of a fast-acting carb (carbohydrate) with you all the time. Make sure your family and friends know how to treat you if you cannot treat yourself. Treating very low blood sugar Severe hypoglycemia is when your blood sugar is at or below 54 mg/dL (3 mmol/L). Severe hypoglycemia is an emergency. Do not wait to see if the symptoms will  go away. Get medical help right away. Call your local emergency services (911 in the [place] Do not drive yourself to the hospital. You may need a glucagon shot if you have very low blood sugar and you cannot eat or drink. Have a family member or friend learn how to check your blood sugar and how to give you a glucagon shot. Ask your doctor if you should have a kit for glucagon shots. Follow these instructions at home: Medicines  Take diabetes medicines as told. If your doctor prescribed insulin or diabetes medicines, take them each day.  Do not run out of insulin or other medicines. Plan  ahead.  If you use insulin, change the amount you take based on how active you are and what foods you eat. Your doctor will tell you how to do this.  Take over-the-counter and prescription medicines only as told by your doctor. Eating and drinking  Eat healthy foods. These include: ? Low-fat (lean) proteins. ? Complex carbs, such as whole grains. ? Fresh fruits and vegetables. ? Low-fat dairy products. ? Healthy fats.  Meet with a food expert (dietitian) to make an eating plan.  Follow instructions from your doctor about what you cannot eat or drink.  Drink enough fluid to keep your pee (urine) pale yellow.  Keep track of carbs that you eat. Read food labels and learn serving sizes of foods.  Follow your sick-day plan when you cannot eat or drink as normal. Make this plan with your doctor so it is ready to use.   Activity  Exercise as told by your doctor. You may need to: ? Do stretching and strength exercises 2 or more times a week. ? Do 150 minutes or more of exercise each week that makes your heart beat faster and makes you sweat.  Spread out your exercise over 3 or more days a week.  Do not go more than 2 days in a row without exercise.  Talk with your doctor before you start a new exercise. Your doctor may tell you to change: ? How much insulin or medicines you take. ? How much food you eat. Lifestyle  Do not use any products that contain nicotine or tobacco, such as cigarettes, e-cigarettes, and chewing tobacco. If you need help quitting, ask your doctor.  If you drink alcohol and your doctor says alcohol is safe for you: ? Limit how much you use to:  0-1 drink a day for women who are not pregnant.  0-2 drinks a day for men. ? Be aware of how much alcohol is in your drink. In the [place] one drink equals one 12 oz bottle of beer (355 mL), one 5 oz glass of wine (148 mL), or one 1 oz glass of hard liquor (44 mL).  Learn to deal with stress. If you need help, ask  your doctor. Body care  Stay up to date with your shots (immunizations).  Have your eyes and feet checked by a doctor as often as told.  Check your skin and feet every day. Check for cuts, bruises, redness, blisters, or sores.  Brush your teeth and gums two times a day. Floss one or more times a day.  Go to the dentist one or more times every 6 months.  Stay at a healthy weight.   General instructions  Share your diabetes care plan with: ? Your work or school. ? People you live with.  Carry a card or wear jewelry that says you have diabetes.  Keep all follow-up visits as told by your doctor. This is important. Questions to ask your doctor  Do I need to meet with a certified expert in diabetes education and care?  Where can I find a support group? Where to find more information  American Diabetes Association: www.diabetes.org  American Association of Diabetes Care and Education Specialists: www.diabeteseducator.org  International Diabetes Federation: MemberVerification.ca Summary  When you have type 2 diabetes, you must make sure your blood sugar (glucose) stays in a healthy range. You can do this with nutrition, exercise, medicines and insulin, and support from doctors and others.  Check your blood sugar every day, or as often as told.  Having diabetes can raise your risk for other long-term health problems. You may get medicines to help prevent these problems.  Share your diabetes management plan with people at work, school, and home.  Keep all follow-up visits as told by your doctor. This is important. This information is not intended to replace advice given to you by your health care provider. Make sure you discuss any questions you have with your health care provider. Document Revised: 09/10/2019 Document Reviewed: 09/10/2019 Elsevier Patient Education  [place].

## 2020-09-06 ENCOUNTER — Ambulatory Visit: Payer: Medicaid Other | Admitting: Family Medicine

## 2020-09-07 ENCOUNTER — Encounter: Payer: Self-pay | Admitting: Family Medicine

## 2020-09-07 ENCOUNTER — Other Ambulatory Visit: Payer: Self-pay

## 2020-09-07 ENCOUNTER — Ambulatory Visit (INDEPENDENT_AMBULATORY_CARE_PROVIDER_SITE_OTHER): Payer: 59 | Admitting: Family Medicine

## 2020-09-07 ENCOUNTER — Telehealth: Payer: Self-pay

## 2020-09-07 VITALS — BP 122/78 | HR 90 | Temp 98.2°F | Resp 16 | Ht 62.0 in | Wt 178.3 lb

## 2020-09-07 DIAGNOSIS — E119 Type 2 diabetes mellitus without complications: Secondary | ICD-10-CM | POA: Diagnosis not present

## 2020-09-07 LAB — POCT GLYCOSYLATED HEMOGLOBIN (HGB A1C): Hemoglobin A1C: 6.5 % — AB (ref 4.0–5.6)

## 2020-09-07 LAB — BASIC METABOLIC PANEL
BUN: 20 mg/dL (ref 7–25)
CO2: 27 mmol/L (ref 20–32)
Calcium: 10.1 mg/dL (ref 8.6–10.4)
Chloride: 105 mmol/L (ref 98–110)
Creat: 0.86 mg/dL (ref 0.50–1.03)
Glucose, Bld: 101 mg/dL — ABNORMAL HIGH (ref 65–99)
Potassium: 4.5 mmol/L (ref 3.5–5.3)
Sodium: 140 mmol/L (ref 135–146)

## 2020-09-07 MED ORDER — METFORMIN HCL ER 750 MG PO TB24: 750.0000 mg | ORAL_TABLET | Freq: Every day | ORAL | 3 refills | Status: DC

## 2020-09-07 MED ORDER — BLOOD GLUCOSE METER KIT: PACK | 0 refills | Status: AC

## 2020-09-07 NOTE — Patient Instructions (Addendum)
It was great to see you!  Our plans for today:  - We are starting you on a medication to help lower your blood sugar. If you don't tolerate this, let us know.  - We are referring you for a diabetic educator. Let us know if you don't hear about an appointment in the next few weeks.   We are checking some labs today, we will release these results to your MyChart.  Take care and seek immediate care sooner if you develop any concerns.   Dr. Linwood Dibbles   Diet Recommendations for Diabetes   1. Eat at least 3 meals and 1-2 snacks per day. Never go more than 4-5 hours while awake without eating. Eat breakfast within the first hour of getting up.   2. Limit starchy foods to TWO per meal and ONE per snack. ONE portion of a starchy  food is equal to the following:   - ONE slice of bread (or its equivalent, such as half of a hamburger bun).   - 1/2 cup of a "scoopable" starchy food such as potatoes or rice.   - 15 grams of Total Carbohydrate as shown on food label.  3. Include at every meal: a protein food, a carb food, and vegetables and/or fruit.   - Obtain twice the volume of vegetables as protein or carbohydrate foods for both lunch and dinner.   - Fresh or frozen vegetables are best.   - Keep frozen vegetables on hand for a quick vegetable serving.       Starchy (carb) foods: Bread, rice, pasta, potatoes, corn, cereal, grits, crackers, bagels, muffins, all baked goods.  (Fruits, milk, and yogurt also have carbohydrate, but most of these foods will not spike your blood sugar as most starchy foods will.)  A few fruits do cause high blood sugars; use small portions of bananas (limit to 1/2 at a time), grapes, watermelon, oranges, and most tropical fruits.    Protein foods: Meat, fish, poultry, eggs, dairy foods, and beans such as pinto and kidney beans (beans also provide carbohydrate  Here is an example of what a healthy plate looks like:    ? Make half your plate fruits and vegetables.     ?  Focus on whole fruits.     ? Vary your veggies.  ? Make half your grains whole grains. -     ? Look for the word "whole" at the beginning of the ingredients list    ? Some whole-grain ingredients include whole oats, whole-wheat flour, whole-grain corn, whole-grain brown rice, and whole rye.  ? Move to low-fat and fat-free milk or yogurt.  ? Vary your protein routine. - Meat, fish, poultry (chicken, [place]), eggs, beans (kidney, pinto), dairy.  ? Drink and eat less sodium, saturated fat, and added sugars.

## 2020-09-07 NOTE — Assessment & Plan Note (Signed)
A1c improved to 6.5. will add metformin to aid in weight loss and insulin sensitivity. Rx for blood sugar meter sent (has been using friend's). Referred for diabetic education. Much counseling today done regarding diabetic progression, complications, diabetic recommendations in diabetes. F/u in 3 months.

## 2020-09-07 NOTE — Progress Notes (Signed)
   SUBJECTIVE:   CHIEF COMPLAINT / HPI:   Diabetes, Type 2 - Last A1c 6.9 05/2020 - Medications: none - Compliance: n/a - Checking BG at home: only if symptomatic - +FH - brother with diabetes. - Diet: ~2-3 meals per day. Yesterday: Baked potato with pork chop, chicken.  - Exercise: none - Eye exam: UTD - Foot exam: UTD - Microalbumin: UTD - Statin: yes - Denies symptoms of hypoglycemia, numbness extremities, foot ulcers/trauma - Having polyuria and polydipsia recently.    OBJECTIVE:   BP 122/78   Pulse 90   Temp 98.2 F (36.8 C)   Resp 16   Ht 5\' 2"  (1.575 m)   Wt 178 lb 4.8 oz (80.9 kg)   BMI 32.61 kg/m   Gen: overweight, in NAD Card: RRR Lungs: CTAB Ext: WWP, no edema  ASSESSMENT/PLAN:   New onset type 2 diabetes mellitus (HCC) A1c improved to 6.5. will add metformin to aid in weight loss and insulin sensitivity. Rx for blood sugar meter sent (has been using friend's). Referred for diabetic education. Much counseling today done regarding diabetic progression, complications, diabetic recommendations in diabetes. F/u in 3 months.     , DO

## 2020-09-07 NOTE — Telephone Encounter (Signed)
Copied from CRM [id number]. Topic: Referral - Request for Referral >> Sep 07, 2020  4:09 PM Elliot Gault wrote: Patient requesting a referral to a podiatrist for toenail clipping and experiencing numbness in her toes, patient states she forgot to mention at today appointment.

## 2020-09-08 ENCOUNTER — Other Ambulatory Visit: Payer: Self-pay | Admitting: Family Medicine

## 2020-09-08 DIAGNOSIS — E119 Type 2 diabetes mellitus without complications: Secondary | ICD-10-CM

## 2020-09-09 NOTE — Progress Notes (Signed)
Patient notified

## 2020-09-17 ENCOUNTER — Telehealth: Payer: Self-pay | Admitting: Family Medicine

## 2020-09-17 NOTE — Telephone Encounter (Signed)
Pt would like a one step glucometer / with test strips / Pt states the lancets are hard to get blood from her finger / Pt wanted to let provider know that she has a hard time getting blood when pricking her finger and wanted to ask if that was a sign for low blood / please advise

## 2020-09-17 NOTE — Telephone Encounter (Signed)
Left vm that she may not be doing finger stick correctly to get enough blood.  Possible not correct location or not pressing hard enough.  Told patient she could go to local pharmacy or come her to make sure she is doing correctly.

## 2020-09-17 NOTE — Telephone Encounter (Signed)
Pts next appt is 12/09/20

## 2020-09-22 ENCOUNTER — Other Ambulatory Visit: Payer: Self-pay | Admitting: Family Medicine

## 2020-09-22 NOTE — Telephone Encounter (Signed)
Pt would like a one touch meter and strips sent to pharmacy asap/ pt called and asked for one on 7.29.22/ please advise   She received the kit with lancets but doesn't like using that   [place] ([facility] [place] [address]  [address] [place] ([place]) [place] [address]  Phone:  [phone]  Fax:  [phone]

## 2020-09-23 ENCOUNTER — Ambulatory Visit: Payer: Medicaid Other | Admitting: Podiatry

## 2020-09-23 ENCOUNTER — Telehealth: Payer: Self-pay | Admitting: Family Medicine

## 2020-09-23 ENCOUNTER — Ambulatory Visit: Payer: 59 | Admitting: Podiatry

## 2020-09-23 NOTE — Telephone Encounter (Signed)
Pt has an appt for next Friday

## 2020-09-23 NOTE — Telephone Encounter (Addendum)
Pt just confused, She thinks the one touch is different from accu chek she thinks the one touch pricks finger and gives reading all at same time hince the name.  I explained to patient they were the same device.

## 2020-09-23 NOTE — Telephone Encounter (Signed)
Copied from CRM [id number]. Topic: Referral - Request for Referral >> Sep 23, 2020  1:32 PM Jaquita Rector A wrote: Has patient seen PCP for this complaint? No *If NO, is insurance requiring patient see PCP for this issue before PCP can refer them? Referral for which specialty: GYN Preferred provider/office: none chosen Reason for referral: Pain in vaginal area

## 2020-09-29 ENCOUNTER — Ambulatory Visit (INDEPENDENT_AMBULATORY_CARE_PROVIDER_SITE_OTHER): Payer: 59 | Admitting: Podiatry

## 2020-09-29 ENCOUNTER — Other Ambulatory Visit: Payer: Self-pay

## 2020-09-29 DIAGNOSIS — M79674 Pain in right toe(s): Secondary | ICD-10-CM | POA: Diagnosis not present

## 2020-09-29 DIAGNOSIS — M79675 Pain in left toe(s): Secondary | ICD-10-CM | POA: Diagnosis not present

## 2020-09-29 DIAGNOSIS — B351 Tinea unguium: Secondary | ICD-10-CM | POA: Diagnosis not present

## 2020-09-29 DIAGNOSIS — E119 Type 2 diabetes mellitus without complications: Secondary | ICD-10-CM

## 2020-09-29 NOTE — Progress Notes (Signed)
  Subjective:  Patient ID: Brenda Ellison, female    DOB: [birth year],  MRN: [id number]  Chief Complaint  Patient presents with   Nail Problem     BIL foot pain, nail /trim pain, pt is diabetic     60 y.o. female presents with the above complaint. History confirmed with patient.  Nails are thickened elongated and painful  Objective:  Physical Exam: warm, good capillary refill, no trophic changes or ulcerative lesions, normal DP and PT pulses, and normal sensory exam. Left Foot: dystrophic yellowed discolored nail plates with subungual debris Right Foot: dystrophic yellowed discolored nail plates with subungual debris  Assessment:   1. Pain due to onychomycosis of toenails of both feet   2. New onset type 2 diabetes mellitus (HCC)      Plan:  Patient was evaluated and treated and all questions answered.  Patient educated on diabetes. Discussed proper diabetic foot care and discussed risks and complications of disease. Educated patient in depth on reasons to return to the office immediately should he/she discover anything concerning or new on the feet. All questions answered. Discussed proper shoes as well.   Discussed the etiology and treatment options for the condition in detail with the patient. Educated patient on the topical and oral treatment options for mycotic nails. Recommended debridement of the nails today. Sharp and mechanical debridement performed of all painful and mycotic nails today. Nails debrided in length and thickness using a nail nipper to level of comfort. Discussed treatment options including appropriate shoe gear. Follow up as needed for painful nails.    Return in about 3 months (around 12/30/2020) for at risk diabetic foot care.

## 2020-10-01 ENCOUNTER — Other Ambulatory Visit: Payer: Self-pay

## 2020-10-01 ENCOUNTER — Encounter: Payer: Self-pay | Admitting: Family Medicine

## 2020-10-01 ENCOUNTER — Ambulatory Visit (INDEPENDENT_AMBULATORY_CARE_PROVIDER_SITE_OTHER): Payer: 59 | Admitting: Family Medicine

## 2020-10-01 VITALS — BP 130/80 | HR 84 | Temp 98.1°F | Resp 14 | Ht 62.0 in | Wt 179.0 lb

## 2020-10-01 DIAGNOSIS — R197 Diarrhea, unspecified: Secondary | ICD-10-CM

## 2020-10-01 DIAGNOSIS — E782 Mixed hyperlipidemia: Secondary | ICD-10-CM | POA: Diagnosis not present

## 2020-10-01 DIAGNOSIS — E119 Type 2 diabetes mellitus without complications: Secondary | ICD-10-CM

## 2020-10-01 DIAGNOSIS — R1031 Right lower quadrant pain: Secondary | ICD-10-CM | POA: Diagnosis not present

## 2020-10-01 DIAGNOSIS — R1030 Lower abdominal pain, unspecified: Secondary | ICD-10-CM

## 2020-10-01 DIAGNOSIS — R87810 Cervical high risk human papillomavirus (HPV) DNA test positive: Secondary | ICD-10-CM

## 2020-10-01 DIAGNOSIS — K219 Gastro-esophageal reflux disease without esophagitis: Secondary | ICD-10-CM | POA: Diagnosis not present

## 2020-10-01 LAB — POCT URINALYSIS DIPSTICK
Bilirubin, UA: NEGATIVE
Blood, UA: NEGATIVE
Glucose, UA: NEGATIVE
Ketones, UA: NEGATIVE
Leukocytes, UA: NEGATIVE
Nitrite, UA: NEGATIVE
Protein, UA: POSITIVE — AB
Spec Grav, UA: 1.025 (ref 1.010–1.025)
Urobilinogen, UA: 0.2 E.U./dL
pH, UA: 5 (ref 5.0–8.0)

## 2020-10-01 MED ORDER — ATORVASTATIN CALCIUM 20 MG PO TABS: 20.0000 mg | ORAL_TABLET | Freq: Every day | ORAL | 3 refills | Status: DC

## 2020-10-01 MED ORDER — METFORMIN HCL ER 750 MG PO TB24: 750.0000 mg | ORAL_TABLET | Freq: Every day | ORAL | 3 refills | Status: DC

## 2020-10-01 MED ORDER — PANTOPRAZOLE SODIUM 20 MG PO TBEC: 20.0000 mg | DELAYED_RELEASE_TABLET | ORAL | 2 refills | Status: DC

## 2020-10-01 NOTE — Progress Notes (Signed)
  Patient ID: Brenda Ellison, female    DOB: 09/12/1960, 60 y.o.   MRN: [id number]  PCP: Tapia, Leisa, PA-C  Chief Complaint  Patient presents with   Abdominal Pain    Bottom af abdomen    Subjective:   Brenda Ellison is a 60 y.o. female, presents to clinic with CC of the following:  Abdominal Pain This is a new problem. The current episode started 1 to 4 weeks ago. The onset quality is gradual. The problem occurs intermittently. The problem has been unchanged. The pain is located in the suprapubic region and RLQ. The pain is mild. The abdominal pain does not radiate. Associated symptoms include diarrhea. Pertinent negatives include no anorexia, arthralgias, belching, constipation, dysuria, fever, flatus, frequency, headaches, hematochezia, hematuria, melena, myalgias, nausea, vomiting or weight loss. Nothing aggravates the pain. The pain is relieved by Nothing. She has tried nothing for the symptoms. The treatment provided no relief. Her past medical history is significant for GERD. There is no history of abdominal surgery, colon cancer, gallstones or pancreatitis.   Patient states the pain has been intermittent very mild and coming and going for several weeks.  Over the last week she has had some looser watery stools that she thinks is from something she ate GERD - worse ran out of meds, prior trial of protonix   T2DM Lab Results  Component Value Date   HGBA1C 6.5 (A) 09/07/2020  On metformin 750 daily  Hyperlipidemia: Currently treated with atorvastatin 20 mg, pt reports she has not gotten her medications but she does not know why Last Lipids: Lab Results  Component Value Date   CHOL 176 02/17/2020   HDL 57 02/17/2020   LDLCALC 95 02/17/2020   TRIG 138 02/17/2020   CHOLHDL 3.1 02/17/2020   - Denies: Chest pain, shortness of breath, myalgias, claudication    Patient Active Problem List   Diagnosis Date Noted   Gastroesophageal reflux disease 03/18/2020   New onset  type 2 diabetes mellitus (HCC) 02/19/2020   Allergic rhinitis 02/17/2020   Thoracic aortic aneurysm without rupture (HCC) 09/23/2019   Small airways disease 05/13/2019   Class 1 obesity with serious comorbidity and body mass index (BMI) of 31.0 to 31.9 in adult 05/13/2019   Family history of early CAD 05/13/2019   Lung nodule 04/01/2019   Wheeze 04/01/2019   Exertional shortness of breath 04/01/2019   Mediastinal lymphadenopathy 04/01/2019   Hilar lymphadenopathy 04/01/2019   Cervical radiculopathy 10/25/2018   Stable angina pectoris (HCC) 09/30/2018   Overweight (BMI 25.0-29.9) 04/23/2018   DDD (degenerative disc disease), lumbar 11/06/2017   Cardiac murmur 03/16/2017   Lumbar radiculopathy 03/16/2017   Osteoarthritis 06/07/2015   Hyperlipidemia 06/01/2015   Urticaria 11/30/2014   Degenerative arthritis of hip 10/19/2014   Arthritis of hand, degenerative 10/05/2014   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   Moderate mitral insufficiency 05/01/2014      Current Outpatient Medications:    albuterol (VENTOLIN HFA) 108 (90 Base) MCG/ACT inhaler, Inhale 2 puffs into the lungs every 4 (four) hours as needed for wheezing or shortness of breath., Disp: 18 g, Rfl: 3   atorvastatin (LIPITOR) 20 MG tablet, Take 1 tablet (20 mg total) by mouth at bedtime., Disp: 90 tablet, Rfl: 3   celecoxib (CELEBREX) 100 MG capsule, Take 100 mg by mouth 2 (two) times daily., Disp: , Rfl:    Cholecalciferol (VITAMIN D3) 2000 UNITS capsule, Take 2,000 Units by mouth daily., Disp: , Rfl:    metFORMIN (GLUCOPHAGE XR)   750 MG 24 hr tablet, Take 1 tablet (750 mg total) by mouth daily with breakfast., Disp: 90 tablet, Rfl: 3   blood glucose meter kit and supplies, Dispense based on patient and insurance preference. Use up to four times daily as directed. (FOR ICD-10 E10.9, E11.9). (Patient not taking: Reported on 10/01/2020), Disp: 1 each, Rfl: 0   pantoprazole (PROTONIX) 20 MG tablet, Take 1 tablet (20 mg total) by mouth every  morning. One hour before breakfast, Disp: 30 tablet, Rfl: 0   Allergies  Allergen Reactions   Tramadol Rash     Social History   Tobacco Use   Smoking status: Former    Packs/day: 1.00    Years: 22.00    Pack years: 22.00    Types: Cigarettes    Start date: 02/21/1988    Quit date: 02/20/2010    Years since quitting: 10.6   Smokeless tobacco: Never  Vaping Use   Vaping Use: Never used  Substance Use Topics   Alcohol use: No   Drug use: No      Chart Review Today: I personally reviewed active problem list, medication list, allergies, family history, social history, health maintenance, notes from last encounter, lab results, imaging with the patient/caregiver today.   Review of Systems  Constitutional: Negative.  Negative for fever and weight loss.  HENT: Negative.    Eyes: Negative.   Respiratory: Negative.    Cardiovascular: Negative.   Gastrointestinal:  Positive for abdominal pain and diarrhea. Negative for anorexia, constipation, flatus, hematochezia, melena, nausea and vomiting.  Endocrine: Negative.   Genitourinary: Negative.  Negative for dysuria, frequency and hematuria.  Musculoskeletal: Negative.  Negative for arthralgias and myalgias.  Skin: Negative.   Allergic/Immunologic: Negative.   Neurological: Negative.  Negative for headaches.  Hematological: Negative.   Psychiatric/Behavioral: Negative.    All other systems reviewed and are negative.     Objective:   Vitals:   10/01/20 1050  BP: 130/80  Pulse: 84  Resp: 14  Temp: 98.1 F (36.7 C)  TempSrc: Oral  SpO2: 97%  Weight: 179 lb (81.2 kg)  Height: 5' 2" (1.575 m)    Body mass index is 32.74 kg/m.  Physical Exam Vitals and nursing note reviewed.  Constitutional:      General: She is not in acute distress.    Appearance: She is well-developed. She is obese. She is not ill-appearing, toxic-appearing or diaphoretic.     Comments: Well-appearing female with truncal and abdominal obesity   HENT:     Head: Normocephalic and atraumatic.     Right Ear: External ear normal.     Left Ear: External ear normal.     Mouth/Throat:     Mouth: Mucous membranes are moist.  Eyes:     General: No scleral icterus.       Right eye: No discharge.        Left eye: No discharge.     Conjunctiva/sclera: Conjunctivae normal.  Cardiovascular:     Rate and Rhythm: Normal rate and regular rhythm.     Pulses: Normal pulses.     Heart sounds: Normal heart sounds.  Pulmonary:     Effort: Pulmonary effort is normal. No respiratory distress.     Breath sounds: Normal breath sounds. No stridor. No wheezing, rhonchi or rales.  Abdominal:     General: Abdomen is protuberant. Bowel sounds are normal. There is no distension.     Palpations: Abdomen is soft. There is no hepatomegaly, mass or pulsatile mass.  Tenderness: There is no abdominal tenderness. There is no right CVA tenderness, left CVA tenderness, guarding or rebound. Negative signs include Murphy's sign and McBurney's sign.  Musculoskeletal:     Cervical back: Normal range of motion. No rigidity.     Right lower leg: No edema.     Left lower leg: No edema.  Lymphadenopathy:     Cervical: No cervical adenopathy.  Skin:    General: Skin is warm and dry.     Coloration: Skin is not jaundiced or pale.     Findings: No lesion or rash.  Neurological:     Mental Status: She is alert. Mental status is at baseline.     Gait: Gait normal.  Psychiatric:        Mood and Affect: Mood normal.        Behavior: Behavior normal.     Results for orders placed or performed in visit on 09/07/20  Basic Metabolic Panel (BMET)  Result Value Ref Range   Glucose, Bld 101 (H) 65 - 99 mg/dL   BUN 20 7 - 25 mg/dL   Creat 0.86 0.50 - 1.03 mg/dL   BUN/Creatinine Ratio NOT APPLICABLE 6 - 22 (calc)   Sodium 140 135 - 146 mmol/L   Potassium 4.5 3.5 - 5.3 mmol/L   Chloride 105 98 - 110 mmol/L   CO2 27 20 - 32 mmol/L   Calcium 10.1 8.6 - 10.4 mg/dL   POCT HgB A1C  Result Value Ref Range   Hemoglobin A1C 6.5 (A) 4.0 - 5.6 %   HbA1c POC (<> result, manual entry)     HbA1c, POC (prediabetic range)     HbA1c, POC (controlled diabetic range)         Assessment & Plan:     ICD-10-CM   1. Lower abdominal pain  R10.30 CBC with Differential/Platelet    COMPLETE METABOLIC PANEL WITH GFR    POCT urinalysis dipstick    Urine Culture    Ambulatory referral to Gynecology   onset couple weeks, intermittent like a "light strain" diarrhea this past week, no other alleviating or aggravating factors, no fever   She states pain is located to her suprapubic and right lower quadrant area, unremarkable abdominal exam Unclear etiology No tenderness on exam today I do not feel any imaging is currently indicated - encouraged her to f/up if worsening for recheck  RLQ could be eval with US or CT - pt is very worried about her ovaries and asks for GYN referral      2. Mixed hyperlipidemia  E78.2 atorvastatin (LIPITOR) 20 MG tablet    COMPLETE METABOLIC PANEL WITH GFR   Refill on statin    3. Type 2 diabetes mellitus without complication, without long-term current use of insulin (HCC)  E11.9 metFORMIN (GLUCOPHAGE XR) 750 MG 24 hr tablet    COMPLETE METABOLIC PANEL WITH GFR   Refill on her medications which she should not be out of we cannot yet check her A1c, tolerating metformin    4. Gastroesophageal reflux disease, unspecified whether esophagitis present  K21.9 pantoprazole (PROTONIX) 20 MG tablet   Refill on Protonix patient only took for about 1 month and did not follow-up    5. Diarrhea, unspecified type  R19.7 COMPLETE METABOLIC PANEL WITH GFR   Patient appears well-hydrated, no abdominal tenderness, encourage supportive diet and pushing fluids    6. Cervical high risk HPV (human papillomavirus) test positive  R87.810 Ambulatory referral to Gynecology   Pap negative but   positive for high risk HPV, patient does request OB/GYN today referral  entered          Leisa Tapia, PA-C 10/01/20 11:01 AM   

## 2020-10-02 LAB — COMPLETE METABOLIC PANEL WITH GFR
AG Ratio: 1.6 (calc) (ref 1.0–2.5)
ALT: 31 U/L — ABNORMAL HIGH (ref 6–29)
AST: 23 U/L (ref 10–35)
Albumin: 4.3 g/dL (ref 3.6–5.1)
Alkaline phosphatase (APISO): 112 U/L (ref 37–153)
BUN: 17 mg/dL (ref 7–25)
CO2: 31 mmol/L (ref 20–32)
Calcium: 9.5 mg/dL (ref 8.6–10.4)
Chloride: 104 mmol/L (ref 98–110)
Creat: 0.83 mg/dL (ref 0.50–1.03)
Globulin: 2.7 g/dL (calc) (ref 1.9–3.7)
Glucose, Bld: 97 mg/dL (ref 65–139)
Potassium: 4.5 mmol/L (ref 3.5–5.3)
Sodium: 140 mmol/L (ref 135–146)
Total Bilirubin: 0.5 mg/dL (ref 0.2–1.2)
Total Protein: 7 g/dL (ref 6.1–8.1)
eGFR: 82 mL/min/{1.73_m2} (ref >=60)

## 2020-10-02 LAB — CBC WITH DIFFERENTIAL/PLATELET
Absolute Monocytes: 593 cells/uL (ref 200–950)
Basophils Absolute: 48 cells/uL (ref 0–200)
Basophils Relative: 0.7 %
Eosinophils Absolute: 179 cells/uL (ref 15–500)
Eosinophils Relative: 2.6 %
HCT: 38.4 % (ref 35.0–45.0)
Hemoglobin: 12.8 g/dL (ref 11.7–15.5)
Lymphs Abs: 3457 cells/uL (ref 850–3900)
MCH: 29.7 pg (ref 27.0–33.0)
MCHC: 33.3 g/dL (ref 32.0–36.0)
MCV: 89.1 fL (ref 80.0–100.0)
MPV: 10.7 fL (ref 7.5–12.5)
Monocytes Relative: 8.6 %
Neutro Abs: 2622 cells/uL (ref 1500–7800)
Neutrophils Relative %: 38 %
Platelets: 248 10*3/uL (ref 140–400)
RBC: 4.31 10*6/uL (ref 3.80–5.10)
RDW: 12.5 % (ref 11.0–15.0)
Total Lymphocyte: 50.1 %
WBC: 6.9 10*3/uL (ref 3.8–10.8)

## 2020-10-02 LAB — URINE CULTURE
MICRO NUMBER:: 12237290
SPECIMEN QUALITY:: ADEQUATE

## 2020-10-05 ENCOUNTER — Other Ambulatory Visit: Payer: Self-pay

## 2020-10-05 ENCOUNTER — Encounter: Payer: Self-pay | Admitting: *Deleted

## 2020-10-05 ENCOUNTER — Encounter: Payer: Medicaid Other | Attending: Family Medicine | Admitting: *Deleted

## 2020-10-05 VITALS — BP 100/68 | Ht 64.0 in | Wt 182.7 lb

## 2020-10-05 DIAGNOSIS — E119 Type 2 diabetes mellitus without complications: Secondary | ICD-10-CM | POA: Insufficient documentation

## 2020-10-05 NOTE — Patient Instructions (Addendum)
Check blood sugars 1 x day before breakfast or 2 hrs after one meal 3 x week Bring blood sugar records to the next appointment  Exercise: Begin walking  for   5-10  minutes   3  days a week and gradually increase   Eat 3 meals day,   1-2  snacks a day Space meals 4-6 hours apart Don't skip meals - eat at least 1 protein and 1 carbohydrate serving Continue to avoid sugar sweetened drinks (soda)  Return for appointment on:   Tuesday October 26, 2020 at 3:30 pm with Velna Hatchet (nurse)

## 2020-10-06 NOTE — Progress Notes (Signed)
Diabetes Self-Management Education  Visit Type: First/Initial  Appt. Start Time: 1545 Appt. End Time: 1655  10/05/2020  [name], identified by name and date of birth, is a 60 y.o. female with a diagnosis of Diabetes: Type 2.   ASSESSMENT  Blood pressure 100/68, height 5\' 4"  (1.626 m), weight 182 lb 11.2 oz (82.9 kg). Body mass index is 31.36 kg/m.   Diabetes Self-Management Education - 10/05/20 1659       Visit Information   Visit Type First/Initial      Initial Visit   Diabetes Type Type 2    Are you currently following a meal plan? Yes    What type of meal plan do you follow? "stopped sugar sodas and decreased fried foods"    Are you taking your medications as prescribed? Yes    Date Diagnosed 2 months ago      Health Coping   How would you rate your overall health? Fair      Psychosocial Assessment   Patient Belief/Attitude about Diabetes Other (comment)   "fair"   Self-care barriers None    Self-management support Doctor's office;Family    Patient Concerns Nutrition/Meal planning;Glycemic Control;Weight Control;Healthy Lifestyle    Special Needs None    Preferred Learning Style Auditory;Visual;Hands on    Learning Readiness Change in progress    How often do you need to have someone help you when you read instructions, pamphlets, or other written materials from your doctor or pharmacy? 1 - Never    What is the last grade level you completed in school? 12      Pre-Education Assessment   Patient understands the diabetes disease and treatment process. Needs Instruction    Patient understands incorporating nutritional management into lifestyle. Needs Instruction    Patient undertands incorporating physical activity into lifestyle. Needs Instruction    Patient understands using medications safely. Needs Instruction    Patient understands monitoring blood glucose, interpreting and using results Needs Review    Patient understands prevention, detection, and  treatment of acute complications. Needs Instruction    Patient understands prevention, detection, and treatment of chronic complications. Needs Instruction    Patient understands how to develop strategies to address psychosocial issues. Needs Instruction    Patient understands how to develop strategies to promote health/change behavior. Needs Instruction      Complications   Last HgB A1C per patient/outside source 6.5 %   08/27/2020   How often do you check your blood sugar? 1-2 times/day    Fasting Blood glucose range (mg/dL) 10/28/2020   She reports FBG's of 130's mg/dL   Postprandial Blood glucose range (mg/dL) 174-944   She reports pp's 140-150's mg/dL   Have you had a dilated eye exam in the past 12 months? No   appt this month   Have you had a dental exam in the past 12 months? No   reports no teeth   Are you checking your feet? No      Dietary Intake   Breakfast bacon, eggs, grits, wheat toast    Snack (morning) reports 0-1 snacks - peanut butter crackers    Lunch skips or has sandwich, yogurt, burger, fries    Dinner sometimes skips - eats beef, pork, fish, chicken; potatoes, peas, beans, corn, rice, green beans, greens, broccoli, cauliflower, brussels sprouts, greens beans, asparagus    Beverage(s) water, diet soda      Exercise   Exercise Type ADL's      Patient Education   Previous  Diabetes Education No    Disease state  Definition of diabetes, type 1 and 2, and the diagnosis of diabetes    Nutrition management  Role of diet in the treatment of diabetes and the relationship between the three main macronutrients and blood glucose level;Food label reading, portion sizes and measuring food.;Reviewed blood glucose goals for pre and post meals and how to evaluate the patients' food intake on their blood glucose level.    Physical activity and exercise  Role of exercise on diabetes management, blood pressure control and cardiac health.    Medications Reviewed patients medication for  diabetes, action, purpose, timing of dose and side effects.    Monitoring Purpose and frequency of SMBG.;Taught/discussed recording of test results and interpretation of SMBG.;Identified appropriate SMBG and/or A1C goals.    Chronic complications Relationship between chronic complications and blood glucose control    Psychosocial adjustment Identified and addressed patients feelings and concerns about diabetes      Individualized Goals (developed by patient)   Reducing Risk Other (comment)   improve blood sugars, lose weight, lead a healthier lifestyle, become more fit     Outcomes   Expected Outcomes Demonstrated interest in learning. Expect positive outcomes    Future DMSE Other (comment)   3 weeks            Individualized Plan for Diabetes Self-Management Training:   Learning Objective:  Patient will have a greater understanding of diabetes self-management. Patient education plan is to attend individual and/or group sessions per assessed needs and concerns.   Plan:   Patient Instructions  Check blood sugars 1 x day before breakfast or 2 hrs after one meal 3 x week Bring blood sugar records to the next appointment  Exercise: Begin walking  for   5-10  minutes   3  days a week and gradually increase   Eat 3 meals day,   1-2  snacks a day Space meals 4-6 hours apart Don't skip meals - eat at least 1 protein and 1 carbohydrate serving Continue to avoid sugar sweetened drinks (soda)  Return for appointment on:   Tuesday October 26, 2020 at 3:30 pm with Velna Hatchet (nurse)  Expected Outcomes:  Demonstrated interest in learning. Expect positive outcomes  Education material provided:  General Meal Planning Guidelines Simple Meal Plan Smart Snacking  If problems or questions, patient to contact team via:   Sharion Settler, RN, CCM, CDCES [phone]  Future DSME appointment:  (3 weeks) October 26, 2020 with the nurse

## 2020-10-19 ENCOUNTER — Encounter: Payer: 59 | Admitting: Obstetrics & Gynecology

## 2020-10-26 ENCOUNTER — Ambulatory Visit: Payer: Medicaid Other | Admitting: *Deleted

## 2020-11-02 ENCOUNTER — Encounter: Payer: 59 | Attending: Family Medicine | Admitting: *Deleted

## 2020-11-02 ENCOUNTER — Other Ambulatory Visit: Payer: Self-pay

## 2020-11-02 ENCOUNTER — Encounter: Payer: Self-pay | Admitting: *Deleted

## 2020-11-02 VITALS — BP 114/72 | Wt 185.8 lb

## 2020-11-02 DIAGNOSIS — E119 Type 2 diabetes mellitus without complications: Secondary | ICD-10-CM | POA: Insufficient documentation

## 2020-11-02 NOTE — Patient Instructions (Addendum)
Check blood sugars before breakfast and 2 hours after one meal 3 week Bring blood sugar records to MD appointment  Exercise: Begin walking for    5-10  minutes   3  days a week and gradually increase  Eat 3 meals day,   1-2  snacks a day Space meals 4-6 hours apart Don't skip meals  Eat a smaller snack at bedtime instead of leftovers  Make an eye doctor appointment

## 2020-11-02 NOTE — Progress Notes (Signed)
Diabetes Self-Management Education  Visit Type: Follow-up  Appt. Start Time: 1500 Appt. End Time: 1535  11/02/2020  [name], identified by name and date of birth, is a 60 y.o. female with a diagnosis of Diabetes: Type 2   ASSESSMENT  Blood pressure 114/72, weight 185 lb 12.8 oz (84.3 kg). Body mass index is 31.89 kg/m.   Diabetes Self-Management Education - 11/02/20 1600       Visit Information   Visit Type Follow-up      Complications   How often do you check your blood sugar? 3-4 times/day    Fasting Blood glucose range (mg/dL) 409-735;329-924   FBG's 132-187 mg/dL   Postprandial Blood glucose range (mg/dL) 26-834;196-222;979-892;>119   most pp's 104-183 mg/dL with 2 readings of 417 and 200 mg/dL   Have you had a dilated eye exam in the past 12 months? No   had to cancel appt - would take her insurance   Have you had a dental exam in the past 12 months? No    Are you checking your feet? No      Dietary Intake   Breakfast 2 meals and 0-1 snacks/day    Beverage(s) water, diet soda      Exercise   Exercise Type ADL's      Individualized Goals (developed by patient)   Nutrition Follow meal plan discussed    Physical Activity Exercise 3-5 times per week;15 minutes per day    Monitoring  test my blood glucose as discussed   3 x week instead of 3 x day   Reducing Risk examine blood glucose patterns      Post-Education Assessment   Patient understands the diabetes disease and treatment process. Demonstrates understanding / competency    Patient understands incorporating nutritional management into lifestyle. Needs Review    Patient undertands incorporating physical activity into lifestyle. Needs Review    Patient understands using medications safely. Demonstrates understanding / competency    Patient understands monitoring blood glucose, interpreting and using results Demonstrates understanding / competency    Patient understands prevention, detection, and  treatment of acute complications. Needs Review    Patient understands prevention, detection, and treatment of chronic complications. Demonstrates understanding / competency    Patient understands how to develop strategies to address psychosocial issues. Demonstrates understanding / competency    Patient understands how to develop strategies to promote health/change behavior. Demonstrates understanding / competency      Outcomes   Expected Outcomes Demonstrated interest in learning. Expect positive outcomes    Program Status Completed      Subsequent Visit   Since your last visit have you continued or begun to take your medications as prescribed? Yes    Since your last visit have you had your blood pressure checked? No    Since your last visit have you experienced any weight changes? Gain    Weight Gain (lbs) 3.1    Since your last visit, are you checking your blood glucose at least once a day? Yes        Individualized Plan for Diabetes Self-Management Training:   Learning Objective:  Patient will have a greater understanding of diabetes self-management. Patient education plan is to attend individual and/or group sessions per assessed needs and concerns.   Plan:   Patient Instructions  Check blood sugars before breakfast and 2 hours after one meal 3 week Bring blood sugar records to MD appointment  Exercise: Begin walking for    5-10  minutes  3  days a week and gradually increase  Eat 3 meals day,   1-2  snacks a day Space meals 4-6 hours apart Don't skip meals  Eat a smaller snack at bedtime instead of leftovers  Make an eye doctor appointment  Expected Outcomes:  Demonstrated interest in learning. Expect positive outcomes  Education material provided:  Planning a Balanced Meal Plate Method (ADA)  If problems or questions, patient to contact team via:   Sharion Settler, RN, CCM, CDCES [phone]  Future DSME appointment: PRN

## 2020-11-11 ENCOUNTER — Ambulatory Visit: Payer: 59 | Admitting: Obstetrics & Gynecology

## 2020-11-15 ENCOUNTER — Ambulatory Visit: Payer: 59 | Admitting: Family Medicine

## 2020-11-16 ENCOUNTER — Ambulatory Visit: Payer: 59 | Admitting: Family Medicine

## 2020-11-16 ENCOUNTER — Telehealth: Payer: 59 | Admitting: Family Medicine

## 2020-11-29 ENCOUNTER — Other Ambulatory Visit (HOSPITAL_COMMUNITY)
Admission: RE | Admit: 2020-11-29 | Discharge: 2020-11-29 | Disposition: A | Discharge status: Home / Self Care | Payer: 59 | Source: Ambulatory Visit | Attending: Obstetrics & Gynecology | Admitting: Obstetrics & Gynecology

## 2020-11-29 ENCOUNTER — Encounter: Payer: Self-pay | Admitting: Obstetrics & Gynecology

## 2020-11-29 ENCOUNTER — Other Ambulatory Visit: Payer: Self-pay

## 2020-11-29 ENCOUNTER — Ambulatory Visit (INDEPENDENT_AMBULATORY_CARE_PROVIDER_SITE_OTHER): Payer: 59 | Admitting: Obstetrics & Gynecology

## 2020-11-29 VITALS — BP 120/80 | Ht 64.0 in | Wt 183.0 lb

## 2020-11-29 DIAGNOSIS — R102 Pelvic and perineal pain: Secondary | ICD-10-CM

## 2020-11-29 DIAGNOSIS — Z124 Encounter for screening for malignant neoplasm of cervix: Secondary | ICD-10-CM

## 2020-11-29 NOTE — Progress Notes (Signed)
Gynecology Pelvic Pain Evaluation   Chief Complaint  Patient presents with   Pelvic Pain   History of Present Illness:   Patient is a 60 y.o. [id number] who LMP was No LMP recorded. Patient is postmenopausal., presents today for a problem visit.  She complains of pain.   Her pain is localized to the suprapubic and vaginal (deep)  area (also has some peri-clitoral pain), described as intermittent, sharp, and stabbing, began several months ago and its severity is described as moderate. The pain radiates to the  Non-radiating. She has these associated symptoms which include none. Patient has these modifiers which include nothing that make it better and unable to associate with any factor that make it worse.  Context includes: spontaneous.  She says the pains are here and there, no warning, no inciting activity, and quickly resolve.  Pt is not sexually active.  Works as Copywriter, advertising.  Previous evaluation: none. Prior Diagnosis: none. Previous Treatment: none.  Pt is menopausal for several years, has mild sx's of hot flashes, not a bother.  No bleeding for years.  Prior NSVD x3 many years ago.  S/p Lap BTL.  PMHx: She  has a past medical history of Arthritis pain, Bilateral leg pain, Chronic nasal congestion, Diabetes mellitus without complication ([place]), Dizziness of unknown cause, Heart murmur previously undiagnosed, Osteoarthritis, hand, and Prediabetes (07/12/2016). Also,  has a past surgical history that includes Hand reconstruction (Bilateral) and Colonoscopy with propofol (N/A, 05/15/2017)., family history includes Diabetes in her brother; Heart attack in her mother.,  reports that she quit smoking about 10 years ago. Her smoking use included cigarettes. She started smoking about 32 years ago. She has a 22.00 pack-year smoking history. She has never used smokeless tobacco. She reports that she does not drink alcohol and does not use drugs.  She has a current medication list which includes the  following prescription(s): albuterol, atorvastatin, blood glucose meter kit and supplies, celecoxib, vitamin d3, metformin, and pantoprazole. Also, is allergic to tramadol.  Review of Systems  Constitutional:  Negative for chills, fever and malaise/fatigue.  HENT:  Negative for congestion, sinus pain and sore throat.   Eyes:  Negative for blurred vision and pain.  Respiratory:  Negative for cough and wheezing.   Cardiovascular:  Negative for chest pain and leg swelling.  Gastrointestinal:  Positive for abdominal pain. Negative for constipation, diarrhea, heartburn, nausea and vomiting.  Genitourinary:  Negative for dysuria, frequency, hematuria and urgency.  Musculoskeletal:  Negative for back pain, joint pain, myalgias and neck pain.  Skin:  Negative for itching and rash.  Neurological:  Negative for dizziness, tremors and weakness.  Endo/Heme/Allergies:  Does not bruise/bleed easily.  Psychiatric/Behavioral:  Negative for depression. The patient is not nervous/anxious and does not have insomnia.    Objective: BP 120/80   Ht 5' 4"  (1.626 m)   Wt 183 lb (83 kg)   BMI 31.41 kg/m  Physical Exam Constitutional:      General: She is not in acute distress.    Appearance: She is well-developed.  Genitourinary:     Right Labia: No rash or tenderness.    Left Labia: No tenderness or rash.    No vaginal erythema or bleeding.      Right Adnexa: not tender and no mass present.    Left Adnexa: not tender and no mass present.    Adnexa exam comments: Difficult to assess due to obesity NT exam.     No cervical motion tenderness, discharge, polyp  or nabothian cyst.     Uterus is not enlarged.     No uterine mass detected.    Pelvic exam was performed with patient in the lithotomy position.  HENT:     Head: Normocephalic and atraumatic.     Nose: Nose normal.  Abdominal:     General: There is no distension.     Palpations: Abdomen is soft.     Tenderness: There is generalized abdominal  tenderness.     Comments: Very min T on exam today  Musculoskeletal:        General: Normal range of motion.  Neurological:     Mental Status: She is alert and oriented to person, place, and time.     Cranial Nerves: No cranial nerve deficit.  Skin:    General: Skin is warm and dry.  Psychiatric:        Attention and Perception: Attention normal.        Mood and Affect: Mood and affect normal.        Speech: Speech normal.        Behavior: Behavior normal.        Thought Content: Thought content normal.        Judgment: Judgment normal.    Female chaperone present for pelvic portion of the physical exam  Assessment: 60 y.o. [id number] with  Pelvic pain .  1. Pelvic pain Discussed GYN etiologies, and we will check [place] for cyst or fibroid.  Pt is menopausal and has no bleeding.   No concern for appendicitis or other urgent diagnoses today. - US PELVIC COMPLETE WITH TRANSVAGINAL; Future  2. Screening for cervical cancer - Cytology - PAP  Harn Applebaum, MD, [place], [facility] 11/29/2020  2:30 PM

## 2020-12-02 ENCOUNTER — Other Ambulatory Visit: Payer: Self-pay | Admitting: Obstetrics & Gynecology

## 2020-12-02 DIAGNOSIS — R102 Pelvic and perineal pain: Secondary | ICD-10-CM

## 2020-12-02 LAB — CYTOLOGY - PAP
Comment: NEGATIVE
Diagnosis: NEGATIVE
High risk HPV: NEGATIVE

## 2020-12-08 ENCOUNTER — Telehealth: Payer: Self-pay

## 2020-12-08 NOTE — Telephone Encounter (Signed)
Called pt and stated Brenda Ellison, did put in the referral for her. Pt confirmed she already has an appointment scheduled.

## 2020-12-09 ENCOUNTER — Ambulatory Visit: Payer: 59 | Admitting: Family Medicine

## 2020-12-09 ENCOUNTER — Ambulatory Visit (INDEPENDENT_AMBULATORY_CARE_PROVIDER_SITE_OTHER): Payer: 59

## 2020-12-09 ENCOUNTER — Other Ambulatory Visit: Payer: Self-pay

## 2020-12-09 ENCOUNTER — Other Ambulatory Visit: Payer: Self-pay | Admitting: Obstetrics & Gynecology

## 2020-12-09 DIAGNOSIS — R102 Pelvic and perineal pain: Secondary | ICD-10-CM

## 2020-12-13 ENCOUNTER — Ambulatory Visit (INDEPENDENT_AMBULATORY_CARE_PROVIDER_SITE_OTHER): Payer: 59 | Admitting: Obstetrics & Gynecology

## 2020-12-13 ENCOUNTER — Encounter: Payer: Self-pay | Admitting: Obstetrics & Gynecology

## 2020-12-13 ENCOUNTER — Other Ambulatory Visit: Payer: Self-pay

## 2020-12-13 VITALS — Ht 64.0 in | Wt 184.0 lb

## 2020-12-13 DIAGNOSIS — R102 Pelvic and perineal pain: Secondary | ICD-10-CM | POA: Diagnosis not present

## 2020-12-13 DIAGNOSIS — N838 Other noninflammatory disorders of ovary, fallopian tube and broad ligament: Secondary | ICD-10-CM

## 2020-12-13 NOTE — Progress Notes (Signed)
Virtual Visit via Telephone Note  I connected with Brenda Ellison on 12/13/20 at  2:10 PM EDT by telephone and verified that I am speaking with the correct person using two identifiers.  Location: Patient: In car Provider: Office   I discussed the limitations, risks, security and privacy concerns of performing an evaluation and management service by telephone and the availability of in person appointments. I also discussed with the patient that there may be a patient responsible charge related to this service. The patient expressed understanding and agreed to proceed.   History of Present Illness: Pt is a 60 yo w recent lower abdominal pains and bloating, improved over the last week (minor sx's currently).  She feels they may be more "nerve related".  [place] last week, as detailed below and in report.  Ultrasound demonstrates enlarged right ovary, no cyst. Normal uterus Right Ovary measures 3.6 x 1.3 x 2.2 cm, yielding a volume of 5.3 centimeters cubed. Left Ovary measures 1.9 x 2.4 x 1.8 cm. It is normal in appearance. Survey of the adnexa demonstrates no adnexal masses. There is no free fluid in the cul de sac. Impression: 1. Right Ovary volume is atypical for postmenopausal patient, however no mass is identified. The ovary is not hyperemic. This finding is of unknown significance based on ultrasound alone.  PMHx: She  has a past medical history of Arthritis pain, Bilateral leg pain, Chronic nasal congestion, Diabetes mellitus without complication ([place]), Dizziness of unknown cause, Heart murmur previously undiagnosed, Osteoarthritis, hand, and Prediabetes (07/12/2016). Also,  has a past surgical history that includes Hand reconstruction (Bilateral) and Colonoscopy with propofol (N/A, 05/15/2017)., family history includes Diabetes in her brother; Heart attack in her mother.,  reports that she quit smoking about 10 years ago. Her smoking use included cigarettes. She started smoking about 32 years  ago. She has a 22.00 pack-year smoking history. She has never used smokeless tobacco. She reports that she does not drink alcohol and does not use drugs.  She has a current medication list which includes the following prescription(s): albuterol, atorvastatin, blood glucose meter kit and supplies, celecoxib, vitamin d3, metformin, and pantoprazole. Also, is allergic to tramadol.  Review of Systems  All other systems reviewed and are negative.  Objective: Ht _0  (1.626 m)   Wt 184 lb (83.5 kg)   BMI 31.58 kg/m    Observations/Objective: No exam today, due to telephone eVisit due to [facility] virus restriction on elective visits and procedures.  Prior visits reviewed along with ultrasounds/labs as indicated.  Assessment and Plan:   ICD-10-CM   1. Pelvic pain  R10.2 Ovarian Malignancy Risk-ROMA    2. Enlarged ovary  N83.8 Ovarian Malignancy Risk-ROMA  Likely incidental finding; will check ROMA/CA125 As pain is better, will monitor for now (if neg)  Follow Up Instructions: Labs   I discussed the assessment and treatment plan with the patient. The patient was provided an opportunity to ask questions and all were answered. The patient agreed with the plan and demonstrated an understanding of the instructions.   The patient was advised to call back or seek an in-person evaluation if the symptoms worsen or if the condition fails to improve as anticipated.  I provided 15 minutes of non-face-to-face time during this encounter.   Hoyt Koch, MD

## 2020-12-14 ENCOUNTER — Other Ambulatory Visit: Payer: 59

## 2020-12-14 DIAGNOSIS — N838 Other noninflammatory disorders of ovary, fallopian tube and broad ligament: Secondary | ICD-10-CM

## 2020-12-14 DIAGNOSIS — R102 Pelvic and perineal pain: Secondary | ICD-10-CM

## 2020-12-15 LAB — OVARIAN MALIGNANCY RISK-ROMA
Cancer Antigen (CA) 125: 20 U/mL (ref 0.0–38.1)
HE4: 43.3 pmol/L (ref 0.0–105.2)
Postmenopausal ROMA: 1.22
Premenopausal ROMA: 0.55

## 2020-12-15 LAB — POSTMENOPAUSAL INTERP: LOW

## 2020-12-15 LAB — PREMENOPAUSAL INTERP: LOW

## 2020-12-16 NOTE — Progress Notes (Signed)
Let pt know blood test is negative indicating low risk that ultrasound findings are related to cancer.  Have her follow up for any changes in her pain symptoms.

## 2020-12-16 NOTE — Progress Notes (Signed)
Pt aware of results and states she is having the same pain. States she thinks its arthritis or her age.

## 2020-12-30 ENCOUNTER — Encounter: Payer: Self-pay | Admitting: Podiatry

## 2020-12-30 ENCOUNTER — Other Ambulatory Visit: Payer: Self-pay

## 2020-12-30 ENCOUNTER — Ambulatory Visit: Payer: 59 | Admitting: Podiatry

## 2020-12-30 DIAGNOSIS — M79675 Pain in left toe(s): Secondary | ICD-10-CM

## 2020-12-30 DIAGNOSIS — E119 Type 2 diabetes mellitus without complications: Secondary | ICD-10-CM | POA: Diagnosis not present

## 2020-12-30 DIAGNOSIS — B351 Tinea unguium: Secondary | ICD-10-CM | POA: Diagnosis not present

## 2020-12-30 DIAGNOSIS — M79674 Pain in right toe(s): Secondary | ICD-10-CM

## 2020-12-30 NOTE — Progress Notes (Signed)
This patient returns to my office for at risk foot care.  This patient requires this care by a professional since this patient will be at risk due to having type 2 diabetes. This patient is unable to cut nails herself since the patient cannot reach her nails.These nails are painful walking and wearing shoes.  This patient presents for at risk foot care today.  General Appearance  Alert, conversant and in no acute stress.  Vascular  Dorsalis pedis and posterior tibial  pulses are palpable  bilaterally.  Capillary return is within normal limits  bilaterally. Temperature is within normal limits  bilaterally.  Neurologic  Senn-Weinstein monofilament wire test within normal limits  bilaterally. Muscle power within normal limits bilaterally.  Nails Thick disfigured discolored nails with subungual debris  from hallux to fifth toes bilaterally. No evidence of bacterial infection or drainage bilaterally.  Orthopedic  No limitations of motion  feet .  No crepitus or effusions noted.  No bony pathology or digital deformities noted.  Skin  normotropic skin with no porokeratosis noted bilaterally.  No signs of infections or ulcers noted.     Onychomycosis  Pain in right toes  Pain in left toes  Consent was obtained for treatment procedures.   Mechanical debridement of nails 1-5  bilaterally performed with a nail nipper.  Filed with dremel without incident.    Return office visit    3 months                  Told patient to return for periodic foot care and evaluation due to potential at risk complications.   Shirrell Solinger DPM   

## 2021-01-19 ENCOUNTER — Ambulatory Visit: Payer: Self-pay

## 2021-01-19 NOTE — Telephone Encounter (Signed)
Pt experiencing vaginal burning/itching. No appointments available in patients time frame.  Please advice.   Pt. Started having vaginal burning and itching last week. No discharge. No urinary symptoms. States "maybe I used too much soap when I bathed." Requests appointment. Please advise pt.    Answer Assessment - Initial Assessment Questions 1. SYMPTOM: "What's the main symptom you're concerned about?" (e.g., pain, itching, dryness)     Burning, itching 2. LOCATION: "Where is the  symptoms located?" (e.g., inside/outside, left/right)     Inside and outside 3. ONSET: "When did the  symptoms  start?"     Last week  4. PAIN: "Is there any pain?" If Yes, ask: "How bad is it?" (Scale: 1-10; mild, moderate, severe)     No 5. ITCHING: "Is there any itching?" If Yes, ask: "How bad is it?" (Scale: 1-10; mild, moderate, severe)     Moderate 6. CAUSE: "What do you think is causing the discharge?" "Have you had the same problem before? What happened then?"     Unsure 7. OTHER SYMPTOMS: "Do you have any other symptoms?" (e.g., fever, itching, vaginal bleeding, pain with urination, injury to genital area, vaginal foreign body)     No 8. PREGNANCY: "Is there any chance you are pregnant?" "When was your last menstrual period?"     No  Protocols used: Vaginal Symptoms-A-AH

## 2021-01-19 NOTE — Telephone Encounter (Signed)
Returned pt call and had to lvm to sch appt. Miel did okay for pt to see Denny Peon

## 2021-01-31 ENCOUNTER — Telehealth: Payer: Self-pay

## 2021-01-31 DIAGNOSIS — Z780 Asymptomatic menopausal state: Secondary | ICD-10-CM

## 2021-01-31 NOTE — Telephone Encounter (Signed)
Copied from CRM [id number]. Topic: General - Other >> Jan 31, 2021  4:29 PM Wyonia Hough E wrote: Reason for CRM: Pt called and stated that [facility] needs an order/referral from [facility] for bone density scan / please advise

## 2021-02-01 NOTE — Telephone Encounter (Signed)
Pt was informed.

## 2021-02-15 ENCOUNTER — Other Ambulatory Visit: Payer: Self-pay | Admitting: Family Medicine

## 2021-02-15 DIAGNOSIS — Z1231 Encounter for screening mammogram for malignant neoplasm of breast: Secondary | ICD-10-CM

## 2021-03-21 ENCOUNTER — Other Ambulatory Visit: Payer: Self-pay

## 2021-03-21 ENCOUNTER — Ambulatory Visit
Admission: RE | Admit: 2021-03-21 | Discharge: 2021-03-21 | Disposition: A | Discharge status: Home / Self Care | Payer: 59 | Source: Ambulatory Visit | Attending: Family Medicine | Admitting: Family Medicine

## 2021-03-21 DIAGNOSIS — Z780 Asymptomatic menopausal state: Secondary | ICD-10-CM | POA: Insufficient documentation

## 2021-03-28 ENCOUNTER — Other Ambulatory Visit: Payer: Self-pay

## 2021-03-28 ENCOUNTER — Ambulatory Visit
Admission: RE | Admit: 2021-03-28 | Discharge: 2021-03-28 | Disposition: A | Discharge status: Home / Self Care | Payer: 59 | Source: Ambulatory Visit | Attending: Family Medicine | Admitting: Family Medicine

## 2021-03-28 DIAGNOSIS — Z1231 Encounter for screening mammogram for malignant neoplasm of breast: Secondary | ICD-10-CM | POA: Insufficient documentation

## 2021-03-31 ENCOUNTER — Other Ambulatory Visit: Payer: Self-pay

## 2021-03-31 ENCOUNTER — Ambulatory Visit (INDEPENDENT_AMBULATORY_CARE_PROVIDER_SITE_OTHER): Payer: 59 | Admitting: Podiatry

## 2021-03-31 ENCOUNTER — Encounter: Payer: Self-pay | Admitting: Podiatry

## 2021-03-31 DIAGNOSIS — B351 Tinea unguium: Secondary | ICD-10-CM | POA: Diagnosis not present

## 2021-03-31 DIAGNOSIS — E119 Type 2 diabetes mellitus without complications: Secondary | ICD-10-CM

## 2021-03-31 DIAGNOSIS — M79674 Pain in right toe(s): Secondary | ICD-10-CM

## 2021-03-31 DIAGNOSIS — M79675 Pain in left toe(s): Secondary | ICD-10-CM | POA: Diagnosis not present

## 2021-03-31 NOTE — Progress Notes (Signed)
This patient returns to my office for at risk foot care.  This patient requires this care by a professional since this patient will be at risk due to having type 2 diabetes. This patient is unable to cut nails herself since the patient cannot reach her nails.These nails are painful walking and wearing shoes.  This patient presents for at risk foot care today.  General Appearance  Alert, conversant and in no acute stress.  Vascular  Dorsalis pedis and posterior tibial  pulses are palpable  bilaterally.  Capillary return is within normal limits  bilaterally. Temperature is within normal limits  bilaterally.  Neurologic  Senn-Weinstein monofilament wire test within normal limits  bilaterally. Muscle power within normal limits bilaterally.  Nails Thick disfigured discolored nails with subungual debris  from hallux to fifth toes bilaterally. No evidence of bacterial infection or drainage bilaterally.  Orthopedic  No limitations of motion  feet .  No crepitus or effusions noted.  No bony pathology or digital deformities noted.  Skin  normotropic skin with no porokeratosis noted bilaterally.  No signs of infections or ulcers noted.     Onychomycosis  Pain in right toes  Pain in left toes  Consent was obtained for treatment procedures.   Mechanical debridement of nails 1-5  bilaterally performed with a nail nipper.  Filed with dremel without incident.    Return office visit    3 months                  Told patient to return for periodic foot care and evaluation due to potential at risk complications.   Rody Keadle DPM   

## 2021-06-06 ENCOUNTER — Encounter: Payer: Self-pay | Admitting: Family Medicine

## 2021-06-06 ENCOUNTER — Ambulatory Visit (INDEPENDENT_AMBULATORY_CARE_PROVIDER_SITE_OTHER): Payer: 59 | Admitting: Family Medicine

## 2021-06-06 VITALS — BP 128/78 | HR 86 | Temp 97.6°F | Resp 16 | Ht 64.0 in | Wt 188.8 lb

## 2021-06-06 DIAGNOSIS — M25512 Pain in left shoulder: Secondary | ICD-10-CM

## 2021-06-06 DIAGNOSIS — J209 Acute bronchitis, unspecified: Secondary | ICD-10-CM | POA: Diagnosis not present

## 2021-06-06 DIAGNOSIS — J302 Other seasonal allergic rhinitis: Secondary | ICD-10-CM | POA: Diagnosis not present

## 2021-06-06 DIAGNOSIS — J984 Other disorders of lung: Secondary | ICD-10-CM | POA: Diagnosis not present

## 2021-06-06 DIAGNOSIS — D860 Sarcoidosis of lung: Secondary | ICD-10-CM

## 2021-06-06 MED ORDER — BENZONATATE 100 MG PO CAPS: 100.0000 mg | ORAL_CAPSULE | Freq: Three times a day (TID) | ORAL | 0 refills | Status: DC | PRN

## 2021-06-06 MED ORDER — PREDNISONE 20 MG PO TABS: ORAL_TABLET | ORAL | 0 refills | Status: DC

## 2021-06-06 MED ORDER — LORATADINE 10 MG PO TABS: 10.0000 mg | ORAL_TABLET | Freq: Every day | ORAL | 11 refills | Status: DC

## 2021-06-06 MED ORDER — ALBUTEROL SULFATE HFA 108 (90 BASE) MCG/ACT IN AERS: 2.0000 | INHALATION_SPRAY | RESPIRATORY_TRACT | 3 refills | Status: DC | PRN

## 2021-06-06 NOTE — Progress Notes (Signed)
? ? ?Patient ID: Brenda Ellison, female    DOB: 02/27/1960, 61 y.o.   MRN: [id number] ? ?PCP: Delsa Grana, PA-C ? ?Chief Complaint  ?Patient presents with  ? Cough  ?  Onset for a month, coughing up phlegm. At night times pt feels she wheezes . Cough has got worst since it started.  ? ? ?Subjective:  ? ?Brenda Ellison is a 61 y.o. female, presents to clinic with CC of the following: ? ?HPI  ?Left shoulder pain acute pain after holding heavy groceries at the store - hurts to lateral and posterior aspect of left shoulder ? ?Allergies and postnasal drainage and cough getting worse over the past 2-3 weeks, cough and wheeze worsening. ?She gets this yearly start of spring. ?She is out of inhalers and has not had any luck getting ahold of pulm at [facility]. ?Hx of bronchitis/reactive airway and sarcoidosis - she has not been good at her routine f/up with [facility] - also looks like she often sees different providers there as well ?Shes tried otc cough meds and mucinex w/o much relief ?Sx are much worse at nighttime - with more frequent cough and audible wheeze ?She denies CP, SOB, DOE, fever, sweats, chills, fatigue ? ? ? ? ?Patient Active Problem List  ? Diagnosis Date Noted  ? Pelvic pain 11/29/2020  ? Gastroesophageal reflux disease 03/18/2020  ? New onset type 2 diabetes mellitus ([place]) 02/19/2020  ? Allergic rhinitis 02/17/2020  ? Thoracic aortic aneurysm without rupture ([place]) 09/23/2019  ? Small airways disease 05/13/2019  ? Class 1 obesity with serious comorbidity and body mass index (BMI) of 31.0 to 31.9 in adult 05/13/2019  ? Family history of early CAD 05/13/2019  ? Lung nodule 04/01/2019  ? Wheeze 04/01/2019  ? Exertional shortness of breath 04/01/2019  ? Mediastinal lymphadenopathy 04/01/2019  ? Hilar lymphadenopathy 04/01/2019  ? Cervical radiculopathy 10/25/2018  ? Stable angina pectoris ([place]) 09/30/2018  ? Overweight (BMI 25.0-29.9) 04/23/2018  ? DDD (degenerative disc disease), lumbar 11/06/2017  ? Cardiac murmur  03/16/2017  ? Lumbar radiculopathy 03/16/2017  ? Osteoarthritis 06/07/2015  ? Hyperlipidemia 06/01/2015  ? Urticaria 11/30/2014  ? Degenerative arthritis of hip 10/19/2014  ? Arthritis of hand, degenerative 10/05/2014  ? 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 Moderate mitral insufficiency 05/01/2014  ? ? ? ? ?Current Outpatient Medications:  ?  albuterol (VENTOLIN HFA) 108 (90 Base) MCG/ACT inhaler, Inhale 2 puffs into the lungs every 4 (four) hours as needed for wheezing or shortness of breath., Disp: 18 g, Rfl: 3 ?  atorvastatin (LIPITOR) 20 MG tablet, Take 1 tablet (20 mg total) by mouth at bedtime., Disp: 90 tablet, Rfl: 3 ?  blood glucose meter kit and supplies, Dispense based on patient and insurance preference. Use up to four times daily as directed. (FOR ICD-10 E10.9, E11.9)., Disp: 1 each, Rfl: 0 ?  celecoxib (CELEBREX) 100 MG capsule, Take 100 mg by mouth 2 (two) times daily., Disp: , Rfl:  ?  Cholecalciferol (VITAMIN D3) 2000 UNITS capsule, Take 2,000 Units by mouth daily., Disp: , Rfl:  ?  gabapentin (NEURONTIN) 100 MG capsule, Take 100 mg by mouth 2 (two) times daily., Disp: , Rfl:  ?  metFORMIN (GLUCOPHAGE XR) 750 MG 24 hr tablet, Take 1 tablet (750 mg total) by mouth daily with breakfast., Disp: 90 tablet, Rfl: 3 ?  pantoprazole (PROTONIX) 20 MG tablet, Take by mouth., Disp: , Rfl:  ?  pantoprazole (PROTONIX) 20 MG tablet, Take 1 tablet (20 mg total)  by mouth every morning. One hour before breakfast, Disp: 30 tablet, Rfl: 2 ? ? ?Allergies  ?Allergen Reactions  ? Tramadol Rash  ? ? ? ?Social History  ? ?Tobacco Use  ? Smoking status: Former  ?  Packs/day: 1.00  ?  Years: 22.00  ?  Pack years: 22.00  ?  Types: Cigarettes  ?  Start date: 02/21/1988  ?  Quit date: 02/20/2010  ?  Years since quitting: 11.2  ? Smokeless tobacco: Never  ?Vaping Use  ? Vaping Use: Never used  ?Substance Use Topics  ? Alcohol use: No  ? Drug use: No  ?  ? ? ?Chart Review Today: ?I personally reviewed active problem list, medication list,  allergies, family history, social history, health maintenance, notes from last encounter, lab results, imaging with the patient/caregiver today. ? ? ?Review of Systems  ?Constitutional: Negative.   ?HENT: Negative.    ?Eyes: Negative.   ?Respiratory: Negative.    ?Cardiovascular: Negative.   ?Gastrointestinal: Negative.   ?Endocrine: Negative.   ?Genitourinary: Negative.   ?Musculoskeletal: Negative.   ?Skin: Negative.   ?Allergic/Immunologic: Negative.   ?Neurological: Negative.   ?Hematological: Negative.   ?Psychiatric/Behavioral: Negative.    ?All other systems reviewed and are negative. ? ?   ?Objective:  ? ?Vitals:  ? 06/06/21 1346  ?BP: 128/78  ?Pulse: 86  ?Resp: 16  ?Temp: 97.6 ?F (36.4 ?C)  ?TempSrc: Oral  ?SpO2: 97%  ?Weight: 188 lb 12.8 oz (85.6 kg)  ?Height: $RemoveB'5\' 4"'YQLsuqNz$  (1.626 m)  ?  ?Body mass index is 32.41 kg/m?. ? ?Physical Exam ?Vitals and nursing note reviewed.  ?Constitutional:   ?   General: She is not in acute distress. ?   Appearance: Normal appearance. She is well-developed. She is obese. She is not ill-appearing, toxic-appearing or diaphoretic.  ?   Interventions: Face mask in place.  ?HENT:  ?   Head: Normocephalic and atraumatic.  ?   Right Ear: External ear normal.  ?   Left Ear: External ear normal.  ?Eyes:  ?   General: Lids are normal. No scleral icterus.    ?   Right eye: No discharge.     ?   Left eye: No discharge.  ?   Conjunctiva/sclera: Conjunctivae normal.  ?Neck:  ?   Trachea: Phonation normal. No tracheal deviation.  ?Cardiovascular:  ?   Rate and Rhythm: Normal rate and regular rhythm.  ?   Pulses: Normal pulses.     ?     Radial pulses are 2+ on the right side and 2+ on the left side.  ?     Posterior tibial pulses are 2+ on the right side and 2+ on the left side.  ?   Heart sounds: Normal heart sounds. No murmur heard. ?  No friction rub. No gallop.  ?Pulmonary:  ?   Effort: Pulmonary effort is normal. No respiratory distress.  ?   Breath sounds: No stridor. Rhonchi present. No  wheezing or rales.  ?Chest:  ?   Chest wall: No tenderness.  ?Abdominal:  ?   General: Bowel sounds are normal. There is no distension.  ?   Palpations: Abdomen is soft.  ?Musculoskeletal:  ?   Left shoulder: Tenderness present. No swelling, deformity, bony tenderness or crepitus. Decreased range of motion. Normal strength. Normal pulse.  ?   Right lower leg: No edema.  ?   Left lower leg: No edema.  ?Skin: ?   General: Skin is warm and dry.  ?  Coloration: Skin is not jaundiced or pale.  ?   Findings: No rash.  ?Neurological:  ?   Mental Status: She is alert.  ?   Motor: No abnormal muscle tone.  ?   Gait: Gait normal.  ?Psychiatric:     ?   Mood and Affect: Mood normal.     ?   Speech: Speech normal.     ?   Behavior: Behavior normal.  ?  ? ?Results for orders placed or performed in visit on 12/14/20  ?Ovarian Malignancy Risk-ROMA  ?Result Value Ref Range  ? Cancer Antigen (CA) 125 20.0 0.0 - 38.1 U/mL  ? HE4 43.3 0.0 - 105.2 pmol/L  ? Premenopausal ROMA 0.55 See below  ? Postmenopausal ROMA 1.22 See below  ? Comment Comment   ?Premenopausal Interp: LOW  ?Result Value Ref Range  ? Premenopausal Interp: LOW Comment   ?Postmenopausal Interp: LOW  ?Result Value Ref Range  ? Postmenopausal Interp: LOW Comment   ? ? ?   ?Assessment & Plan:  ? ?1. Acute bronchitis, unspecified organism ?Patient with increasing cough, chest congestion, largely nonproductive, associated rattling and wheeze especially when lying down at night.  Her symptoms have been ongoing for 2 to 3 weeks.  She is out of her inhaler, has used a few over-the-counter medications without much improvement. ?We will treat for acute bronchitis explained how this is viral and reactive in nature and we will use a steroid taper, albuterol and Mucinex and other over-the-counter cough medications to manage symptoms.  She is not having any fever sweats chills fatigue, pleuritic chest pain.  Her lungs have no crackles but scattered rhonchi and when trying to take a  deep breath and when laughing in clinic she did have some coughing fits which I suspect are bronchospasms ?I expect her to see some improvement in the next week and have encouraged her to follow-up here if any

## 2021-06-06 NOTE — Patient Instructions (Addendum)
Please call your lung specialists and get a follow up appointment. ? ?For your cough, wheeze and allergies - start a daily allergy pill (loratidine), take the prednisone taper, use your albuterol inhaler and get and take mucinex daily for the next week.  ? ?Acute Bronchitis, Adult ? ?Acute bronchitis is when air tubes in the lungs (bronchi) suddenly get swollen. The condition can make it hard for you to breathe. In adults, acute bronchitis usually goes away within 2 weeks. A cough caused by bronchitis may last up to 3 weeks. Smoking, allergies, and asthma can make the condition worse. ?What are the causes? ?Germs that cause cold and flu (viruses). The most common cause of this condition is the virus that causes the common cold. ?Bacteria. ?Substances that bother (irritate) the lungs, including: ?Smoke from cigarettes and other types of tobacco. ?Dust and pollen. ?Fumes from chemicals, gases, or burned fuel. ?Indoor or outdoor air pollution. ?What increases the risk? ?A weak body's defense system. This is also called the immune system. ?Any condition that affects your lungs and breathing, such as asthma. ?What are the signs or symptoms? ?A cough. ?Coughing up clear, yellow, or green mucus. ?Making high-pitched whistling sounds when you breathe, most often when you breathe out (wheezing). ?Runny or stuffy nose. ?Having too much mucus in your lungs (chest congestion). ?Shortness of breath. ?Body aches. ?A sore throat. ?How is this treated? ?Acute bronchitis may go away over time without treatment. Your doctor may tell you to: ?Drink more fluids. This will help thin your mucus so it is easier to cough up. ?Use a device that gets medicine into your lungs (inhaler). ?Use a vaporizer or a humidifier. These are machines that add water to the air. This helps with coughing and poor breathing. ?Take a medicine that thins mucus and helps clear it from your lungs. ?Take a medicine that prevents or stops coughing. ?It is not  common to take an antibiotic medicine for this condition. ?Follow these instructions at home: ? ?Take over-the-counter and prescription medicines only as told by your doctor. ?Use an inhaler, vaporizer, or humidifier as told by your doctor. ?Take two teaspoons (10 mL) of honey at bedtime. This helps lessen your coughing at night. ?Drink enough fluid to keep your pee (urine) pale yellow. ?Do not smoke or use any products that contain nicotine or tobacco. If you need help quitting, ask your doctor. ?Get a lot of rest. ?Return to your normal activities when your doctor says that it is safe. ?Keep all follow-up visits. ?How is this prevented? ? ?Wash your hands often with soap and water for at least 20 seconds. If you cannot use soap and water, use hand sanitizer. ?Avoid contact with people who have cold symptoms. ?Try not to touch your mouth, nose, or eyes with your hands. ?Avoid breathing in smoke or chemical fumes. ?Make sure to get the flu shot every year. ?Contact a doctor if: ?Your symptoms do not get better in 2 weeks. ?You have trouble coughing up the mucus. ?Your cough keeps you awake at night. ?You have a fever. ?Get help right away if: ?You cough up blood. ?You have chest pain. ?You have very bad shortness of breath. ?You faint or keep feeling like you are going to faint. ?You have a very bad headache. ?Your fever or chills get worse. ?These symptoms may be an emergency. Get help right away. Call your local emergency services (911 in the [place] ?Do not wait to see if the symptoms will go  away. ?Do not drive yourself to the hospital. ?Summary ?Acute bronchitis is when air tubes in the lungs (bronchi) suddenly get swollen. In adults, acute bronchitis usually goes away within 2 weeks. ?Drink more fluids. This will help thin your mucus so it is easier to cough up. ?Take over-the-counter and prescription medicines only as told by your doctor. ?Contact a doctor if your symptoms do not improve after 2 weeks of  treatment. ?This information is not intended to replace advice given to you by your health care provider. Make sure you discuss any questions you have with your health care provider. ?Document Revised: 06/09/2020 Document Reviewed: 06/09/2020 ?Elsevier Patient Education ? 2023 Elsevier Inc. ? ?

## 2021-06-15 ENCOUNTER — Ambulatory Visit
Admission: RE | Admit: 2021-06-15 | Discharge: 2021-06-15 | Disposition: A | Discharge status: Home / Self Care | Payer: 59 | Source: Ambulatory Visit | Attending: Nurse Practitioner | Admitting: Nurse Practitioner

## 2021-06-15 ENCOUNTER — Encounter: Payer: Self-pay | Admitting: Nurse Practitioner

## 2021-06-15 ENCOUNTER — Ambulatory Visit
Admission: RE | Admit: 2021-06-15 | Discharge: 2021-06-15 | Disposition: A | Discharge status: Home / Self Care | Payer: 59 | Attending: Nurse Practitioner | Admitting: Nurse Practitioner

## 2021-06-15 ENCOUNTER — Ambulatory Visit (INDEPENDENT_AMBULATORY_CARE_PROVIDER_SITE_OTHER): Payer: 59 | Admitting: Nurse Practitioner

## 2021-06-15 ENCOUNTER — Ambulatory Visit: Payer: Self-pay

## 2021-06-15 ENCOUNTER — Other Ambulatory Visit: Payer: Self-pay

## 2021-06-15 VITALS — BP 118/72 | HR 95 | Temp 99.1°F | Resp 16 | Ht 64.0 in | Wt 186.6 lb

## 2021-06-15 DIAGNOSIS — R053 Chronic cough: Secondary | ICD-10-CM

## 2021-06-15 MED ORDER — BUDESONIDE-FORMOTEROL FUMARATE 160-4.5 MCG/ACT IN AERO: 2.0000 | INHALATION_SPRAY | Freq: Two times a day (BID) | RESPIRATORY_TRACT | 0 refills | Status: DC

## 2021-06-15 MED ORDER — HYDROCOD POLI-CHLORPHE POLI ER 10-8 MG/5ML PO SUER: 5.0000 mL | Freq: Two times a day (BID) | ORAL | 0 refills | Status: DC | PRN

## 2021-06-15 NOTE — Telephone Encounter (Signed)
?  Chief Complaint: cough ?Symptoms: frequent productive cough, wheezing and crackles heard ?Frequency: over a month ?Pertinent Negatives: Patient denies hemoptysis, chest pain,fever ?Disposition: [] ED /[] Urgent Care (no appt availability in office) / [x] Appointment(In office/virtual)/ []  [place] [facility]/ [] Home Care/ [] Refused Recommended Disposition /[] [place] [facility]/ []  Follow-up with PCP ?Additional Notes: Pt has appt with [facility] pulmonary May 31.  ?Disposition was for pt to be seen within 24 hours. No available appts with PCP until May. ?Called office and FC made appt to  see Serafina Royals NP today at 1:00.   ?Called pt and informed to come to our office at 1245 for a 1300 appt . Pt verbalized understanding. ? ? ? ? ? ? ?Reason for Disposition ? [1] Known COPD or other severe lung disease (i.e., bronchiectasis, cystic fibrosis, lung surgery) AND [2] worsening symptoms (i.e., increased sputum purulence or amount, increased breathing difficulty ? ?Answer Assessment - Initial Assessment Questions ?1. ONSET: "When did the cough begin?"  ?    1 month ago ?2. SEVERITY: "How bad is the cough today?"  ?    Frequent ?3. SPUTUM: "Describe the color of your sputum" (none, dry cough; clear, white, yellow, green) ?    clear ?4. HEMOPTYSIS: "Are you coughing up any blood?" If so ask: "How much?" (flecks, streaks, tablespoons, etc.) ?    no ?5. DIFFICULTY BREATHING: "Are you having difficulty breathing?" If Yes, ask: "How bad is it?" (e.g., mild, moderate, severe)  ?  - MILD: No SOB at rest, mild SOB with walking, speaks normally in sentences, can lie down, no retractions, pulse < 100.  ?  - MODERATE: SOB at rest, SOB with minimal exertion and prefers to sit, cannot lie down flat, speaks in phrases, mild retractions, audible wheezing, pulse 100-120.  ?  - SEVERE: Very SOB at rest, speaks in single words, struggling to breathe, sitting hunched forward, retractions, pulse > 120  ?    mild ?6. FEVER: "Do you have a  fever?" If Yes, ask: "What is your temperature, how was it measured, and when did it start?" ?    no ?7. CARDIAC HISTORY: "Do you have any history of heart disease?" (e.g., heart attack, congestive heart failure)  ?    Heart murmur ?8. LUNG HISTORY: "Do you have any history of lung disease?"  (e.g., pulmonary embolus, asthma, emphysema) ?    sarcoidosis ?9. PE RISK FACTORS: "Do you have a history of blood clots?" (or: recent major surgery, recent prolonged travel, bedridden) ?    no ?10. OTHER SYMPTOMS: "Do you have any other symptoms?" (e.g., runny nose, wheezing, chest pain) ?      Tightness in chest and tessalon Perles, loratidine ?11. PREGNANCY: "Is there any chance you are pregnant?" "When was your last menstrual period?" ?      *No Answer* ?12. TRAVEL: "Have you traveled out of the country in the last month?" (e.g., travel history, exposures) ?      *No Answer* ? ?Protocols used: Cough - Acute Productive-A-AH ? ?

## 2021-06-15 NOTE — Progress Notes (Signed)
? ?BP 118/72   Pulse 95   Temp 99.1 ?F (37.3 ?C) (Oral)   Resp 16   Ht 5\' 4"  (1.626 m)   Wt 186 lb 9.6 oz (84.6 kg)   SpO2 96%   BMI 32.03 kg/m?   ? ?Subjective:  ? ? Patient ID: , female    DOB: [birth year], 61 y.o.   MRN: [id number] ? ?HPI: ?[name] is a 61 y.o. female ? ?Chief Complaint  ?Patient presents with  ? Follow-up  ?  Still have a cough  ? ?Persistent cough: Patient was seen by 46, PA-C on 06/06/2021 for acute bronchitis.  Patient was treated with Tessalon Perles albuterol and prednisone steroid pack.  Patient reports she still has a cough.  Patient denies any shortness of breath or fever.  However patient's temperature today was 99.1.  Discussed continuing albuterol inhaler as needed, will send in prescription for Symbicort, and tussinex.  Will get chest x-ray to rule out infection.  She does have an appointment coming up with pulmonology. ? ?Relevant past medical, surgical, family and social history reviewed and updated as indicated. Interim medical history since our last visit reviewed. ?Allergies and medications reviewed and updated. ? ?Review of Systems ? ?Constitutional: Negative for fever or weight change.  ?Respiratory: positive for cough, negative shortness of breath.   ?Cardiovascular: Negative for chest pain or palpitations.  ?Gastrointestinal: Negative for abdominal pain, no bowel changes.  ?Musculoskeletal: Negative for gait problem or joint swelling.  ?Skin: Negative for rash.  ?Neurological: Negative for dizziness or headache.  ?No other specific complaints in a complete review of systems (except as listed in HPI above).  ? ?   ?Objective:  ?  ?BP 118/72   Pulse 95   Temp 99.1 ?F (37.3 ?C) (Oral)   Resp 16   Ht 5\' 4"  (1.626 m)   Wt 186 lb 9.6 oz (84.6 kg)   SpO2 96%   BMI 32.03 kg/m?   ?Wt Readings from Last 3 Encounters:  ?06/15/21 186 lb 9.6 oz (84.6 kg)  ?06/06/21 188 lb 12.8 oz (85.6 kg)  ?12/13/20 184 lb (83.5 kg)  ?  ?Physical  Exam ? ?Constitutional: Patient appears well-developed and well-nourished. Obese  No distress.  ?HEENT: head atraumatic, normocephalic, pupils equal and reactive to light, neck supple ?Cardiovascular: Normal rate, regular rhythm and normal heart sounds.  No murmur heard. No BLE edema. ?Pulmonary/Chest: Effort normal and breath sounds rhonchi. No respiratory distress. ?Abdominal: Soft.  There is no tenderness. ?Psychiatric: Patient has a normal mood and affect. behavior is normal. Judgment and thought content normal.  ? ?Results for orders placed or performed in visit on 12/14/20  ?Ovarian Malignancy Risk-ROMA  ?Result Value Ref Range  ? Cancer Antigen (CA) 125 20.0 0.0 - 38.1 U/mL  ? HE4 43.3 0.0 - 105.2 pmol/L  ? Premenopausal ROMA 0.55 See below  ? Postmenopausal ROMA 1.22 See below  ? Comment Comment   ?Premenopausal Interp: LOW  ?Result Value Ref Range  ? Premenopausal Interp: LOW Comment   ?Postmenopausal Interp: LOW  ?Result Value Ref Range  ? Postmenopausal Interp: LOW Comment   ? ?   ?Assessment & Plan:  ? ?1. Persistent cough ?Continue to use albuterol as needed ?Appointment with pulmonology ?- DG Chest 2 View; Future ?- chlorpheniramine-HYDROcodone (TUSSIONEX PENNKINETIC ER) 10-8 MG/5ML; Take 5 mLs by mouth every 12 (twelve) hours as needed for cough.  Dispense: 115 mL; Refill: 0 ?- budesonide-formoterol (SYMBICORT) 160-4.5 MCG/ACT inhaler; Inhale 2  puffs into the lungs 2 (two) times daily.  Dispense: 1 each; Refill: 0  ? ?Follow up plan: ?Return if symptoms worsen or fail to improve. ? ? ? ? ? ?

## 2021-06-15 NOTE — Telephone Encounter (Signed)
Appt made for today 06-15-2021 ay 1pm with Della Goo

## 2021-06-17 ENCOUNTER — Ambulatory Visit: Payer: 59 | Admitting: Family Medicine

## 2021-06-22 ENCOUNTER — Ambulatory Visit: Payer: Self-pay

## 2021-06-22 NOTE — Telephone Encounter (Signed)
Patient called, left VM to return the call to the office to discuss symptoms with a nurse. ? ?Summary: advice - cough  ? Pt was seen on 4/26 and given cough medication, pt stated she is still coughing and believes she needs more medication, pt needed advice  ?  ? ?

## 2021-06-22 NOTE — Telephone Encounter (Signed)
Pt called again, LVMTCB, will route to practice since pt is requesting more medications.  ?

## 2021-06-23 ENCOUNTER — Encounter: Payer: Self-pay | Admitting: Family Medicine

## 2021-06-23 ENCOUNTER — Ambulatory Visit (INDEPENDENT_AMBULATORY_CARE_PROVIDER_SITE_OTHER): Payer: 59 | Admitting: Family Medicine

## 2021-06-23 VITALS — BP 110/68 | HR 87 | Resp 16 | Ht 64.0 in | Wt 188.0 lb

## 2021-06-23 DIAGNOSIS — J310 Chronic rhinitis: Secondary | ICD-10-CM

## 2021-06-23 DIAGNOSIS — E669 Obesity, unspecified: Secondary | ICD-10-CM

## 2021-06-23 DIAGNOSIS — J329 Chronic sinusitis, unspecified: Secondary | ICD-10-CM

## 2021-06-23 DIAGNOSIS — E782 Mixed hyperlipidemia: Secondary | ICD-10-CM

## 2021-06-23 DIAGNOSIS — Z5181 Encounter for therapeutic drug level monitoring: Secondary | ICD-10-CM

## 2021-06-23 DIAGNOSIS — R053 Chronic cough: Secondary | ICD-10-CM

## 2021-06-23 DIAGNOSIS — D860 Sarcoidosis of lung: Secondary | ICD-10-CM

## 2021-06-23 DIAGNOSIS — Z6832 Body mass index (BMI) 32.0-32.9, adult: Secondary | ICD-10-CM

## 2021-06-23 DIAGNOSIS — E119 Type 2 diabetes mellitus without complications: Secondary | ICD-10-CM | POA: Diagnosis not present

## 2021-06-23 DIAGNOSIS — I340 Nonrheumatic mitral (valve) insufficiency: Secondary | ICD-10-CM

## 2021-06-23 DIAGNOSIS — J302 Other seasonal allergic rhinitis: Secondary | ICD-10-CM | POA: Diagnosis not present

## 2021-06-23 DIAGNOSIS — J984 Other disorders of lung: Secondary | ICD-10-CM

## 2021-06-23 DIAGNOSIS — I208 Other forms of angina pectoris: Secondary | ICD-10-CM

## 2021-06-23 DIAGNOSIS — R059 Cough, unspecified: Secondary | ICD-10-CM | POA: Diagnosis not present

## 2021-06-23 DIAGNOSIS — I712 Thoracic aortic aneurysm, without rupture, unspecified: Secondary | ICD-10-CM

## 2021-06-23 DIAGNOSIS — R0609 Other forms of dyspnea: Secondary | ICD-10-CM

## 2021-06-23 MED ORDER — PROMETHAZINE-DM 6.25-15 MG/5ML PO SYRP: 2.5000 mL | ORAL_SOLUTION | Freq: Three times a day (TID) | ORAL | 1 refills | Status: DC | PRN

## 2021-06-23 MED ORDER — MONTELUKAST SODIUM 10 MG PO TABS: 10.0000 mg | ORAL_TABLET | Freq: Every day | ORAL | 3 refills | Status: DC

## 2021-06-23 MED ORDER — BENZONATATE 100 MG PO CAPS: 100.0000 mg | ORAL_CAPSULE | Freq: Three times a day (TID) | ORAL | 1 refills | Status: DC | PRN

## 2021-06-23 MED ORDER — LEVOCETIRIZINE DIHYDROCHLORIDE 5 MG PO TABS: 5.0000 mg | ORAL_TABLET | Freq: Every evening | ORAL | 1 refills | Status: DC

## 2021-06-23 NOTE — Progress Notes (Signed)
? ?Name: Brenda Ellison   MRN: [id number]    DOB: Nov 15, 1960   Date:06/23/2021 ? ?     Progress Note ? ?Chief Complaint  ?Patient presents with  ? Follow-up  ? ? ? ?Subjective:  ? ?Brenda Ellison is a 61 y.o. female, presents to clinic for cough f/up and has other routine f/up overdue: ? ?Cough - multiple visits recently, is here asking for tussionex cough syrup refill ?She has not been taking her allergy meds, cough is worse at night, she is not really coughing anything up she is having fits of coughing sometimes with activity, temperature changes or being outside and sometimes at night when lying down.  She did recently restart a daily maintenance inhaler she has history of small airway disease, sarcoidosis, was previously seeing pulmonary at [facility] -tends to have chronic dyspnea and is getting evaluated by cardiology at [facility] ?She denies any fever, sweats, chills, chest pain, fatigue, lower extremity edema, palpitations ? ? ?DM: Previously well controlled, recent onset of type 2 diabetes in the last 2 years, she did not come for her care as often as instructed due to insurance not covering our clinic. ?Pt managing DM with metformin 750 mg once daily ?Reports good  med compliance ?Pt has no SE from meds. ?Blood sugars  -she reports checking them recently and having blood sugars in the morning between 160 and 300 ?Denies: Polyuria, polydipsia, vision changes, neuropathy, hypoglycemia ?Recent pertinent labs: ?Lab Results  ?Component Value Date  ? HGBA1C 6.5 (A) 09/07/2020  ? HGBA1C 6.9 (A) 06/15/2020  ? HGBA1C 7.1 (H) 02/17/2020  ? ?Lab Results  ?Component Value Date  ? MICROALBUR 1.0 03/16/2020  ? [place] 95 02/17/2020  ? CREATININE 0.83 10/01/2020  ? ?Standard of care and health maintenance: ?Urine Microalbumin:  due ?Foot exam:  done/utd ?DM eye exam:  due ?ACEI/ARB:  no - BP low normal, ACEI/ARB not started ?Statin:  yes ? ? ?BP: ?Well controlled w/o meds  ?BP Readings from Last 3 Encounters:  ?06/23/21 110/68   ?06/15/21 118/72  ?06/06/21 128/78  ? ?Pt denies CP, SOB, exertional sx, LE edema, palpitation, Ha's, visual disturbances, lightheadedness, hypotension, syncope. ?Dietary efforts for BP?  None right now  ? ?Hyperlipidemia: ?Currently treated with lipitor 20 mg, pt reports good med compliance ?Last Lipids: ?Lab Results  ?Component Value Date  ? CHOL 176 02/17/2020  ? HDL 57 02/17/2020  ? [place] 95 02/17/2020  ? TRIG 138 02/17/2020  ? CHOLHDL 3.1 02/17/2020  ? ?- Denies: Chest pain, myalgias, claudication ? ?Seeing cardiology at [facility] and ECG recently done - waiting for results from visit this week. ?Per care everywhere last OV with Nehemiah Massed was 06/07/2021 -for moderate mitral insufficiency, thoracic aortic aneurysm without rupture, and dyspnea on exertion ?Echo was completed 06/21/2021 ?61 y.o. female with  ?Encounter Diagnoses  ?Name Primary?  ? Benign essential HTN  ? Frequent PVCs  ? Moderate mitral insufficiency  ? Thoracic aortic aneurysm without rupture, unspecified part (CMS-HCC) Yes  ? Mixed hyperlipidemia  ? SOBOE (shortness of breath on exertion)  ? ?Plan  ? ?1. Thoracic aortic aneurysm without rupture: ?-Repeat echocardiogram for further evaluation for any changes ? ?2. Hypertension: Mildly elevated at 142/80 with no previous elevated office readings ?-No changes to current management ? ?3. Frequent PVCs: With no complaints of worsening palpitations or irregular heartbeats over the last year ?-No changes to current management ? ?4. Hyperlipidemia: Currently on atorvastatin 20 mg daily with no reported side effects  to management at this time ?-Continue atorvastatin with no changes ? ?Orders Placed This Encounter  ?Procedures  ? ECG 12-lead  ? Echo complete  ? ?No follow-ups on file. ? ?Attestation Statement:  ? ?I personally performed the service, non-incident to. [facility])  ? ?[place], [place] ? ? ? ?Current Outpatient Medications:  ?  albuterol (VENTOLIN HFA) 108 (90 Base) MCG/ACT inhaler, Inhale 2  puffs into the lungs every 4 (four) hours as needed for wheezing or shortness of breath., Disp: 18 g, Rfl: 3 ?  atorvastatin (LIPITOR) 20 MG tablet, Take 1 tablet (20 mg total) by mouth at bedtime., Disp: 90 tablet, Rfl: 3 ?  blood glucose meter kit and supplies, Dispense based on patient and insurance preference. Use up to four times daily as directed. (FOR ICD-10 E10.9, E11.9)., Disp: 1 each, Rfl: 0 ?  budesonide-formoterol (SYMBICORT) 160-4.5 MCG/ACT inhaler, Inhale 2 puffs into the lungs 2 (two) times daily., Disp: 1 each, Rfl: 0 ?  celecoxib (CELEBREX) 100 MG capsule, Take 100 mg by mouth 2 (two) times daily., Disp: , Rfl:  ?  Cholecalciferol (VITAMIN D3) 2000 UNITS capsule, Take 2,000 Units by mouth daily., Disp: , Rfl:  ?  gabapentin (NEURONTIN) 100 MG capsule, Take 100 mg by mouth 2 (two) times daily., Disp: , Rfl:  ?  levocetirizine (XYZAL) 5 MG tablet, Take 1 tablet (5 mg total) by mouth every evening., Disp: 90 tablet, Rfl: 1 ?  metFORMIN (GLUCOPHAGE XR) 750 MG 24 hr tablet, Take 1 tablet (750 mg total) by mouth daily with breakfast., Disp: 90 tablet, Rfl: 3 ?  montelukast (SINGULAIR) 10 MG tablet, Take 1 tablet (10 mg total) by mouth at bedtime., Disp: 30 tablet, Rfl: 3 ?  pantoprazole (PROTONIX) 20 MG tablet, Take by mouth., Disp: , Rfl:  ?  promethazine-dextromethorphan (PROMETHAZINE-DM) 6.25-15 MG/5ML syrup, Take 2.5-5 mLs by mouth 3 (three) times daily as needed for cough., Disp: 118 mL, Rfl: 1 ?  benzonatate (TESSALON) 100 MG capsule, Take 1-2 capsules (100-200 mg total) by mouth 3 (three) times daily as needed for cough., Disp: 30 capsule, Rfl: 1 ?  pantoprazole (PROTONIX) 20 MG tablet, Take 1 tablet (20 mg total) by mouth every morning. One hour before breakfast, Disp: 30 tablet, Rfl: 2 ? ?Patient Active Problem List  ? Diagnosis Date Noted  ? Sarcoidosis of lung ([place]) 06/06/2021  ? Pelvic pain 11/29/2020  ? Gastroesophageal reflux disease 03/18/2020  ? New onset type 2 diabetes mellitus ([place])  02/19/2020  ? Allergic rhinitis 02/17/2020  ? Thoracic aortic aneurysm without rupture ([place]) 09/23/2019  ? Small airways disease 05/13/2019  ? Class 1 obesity with serious comorbidity and body mass index (BMI) of 31.0 to 31.9 in adult 05/13/2019  ? Family history of early CAD 05/13/2019  ? Lung nodule 04/01/2019  ? Wheeze 04/01/2019  ? Exertional shortness of breath 04/01/2019  ? Mediastinal lymphadenopathy 04/01/2019  ? Hilar lymphadenopathy 04/01/2019  ? Cervical radiculopathy 10/25/2018  ? Stable angina pectoris ([place]) 09/30/2018  ? Overweight (BMI 25.0-29.9) 04/23/2018  ? DDD (degenerative disc disease), lumbar 11/06/2017  ? Cardiac murmur 03/16/2017  ? Lumbar radiculopathy 03/16/2017  ? Osteoarthritis 06/07/2015  ? Hyperlipidemia 06/01/2015  ? Seasonal allergies 01/12/2015  ? Urticaria 11/30/2014  ? Degenerative arthritis of hip 10/19/2014  ? Arthritis of hand, degenerative 10/05/2014  ? 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 Moderate mitral insufficiency 05/01/2014  ? ? ?Past Surgical History:  ?Procedure Laterality Date  ? COLONOSCOPY WITH PROPOFOL N/A 05/15/2017  ? Procedure: COLONOSCOPY WITH  PROPOFOL;  Surgeon: Jonathon Bellows, MD;  Location: [facility] ENDOSCOPY;  Service: Gastroenterology;  Laterality: N/A;  ? HAND RECONSTRUCTION Bilateral   ? as a child  ? ? ?Family History  ?Problem Relation Age of Onset  ? Diabetes Brother   ? Heart attack Mother   ? Breast cancer Neg Hx   ? Ovarian cancer Neg Hx   ? Colon cancer Neg Hx   ? Heart disease Neg Hx   ? ? ?Social History  ? ?Tobacco Use  ? Smoking status: Former  ?  Packs/day: 1.00  ?  Years: 22.00  ?  Pack years: 22.00  ?  Types: Cigarettes  ?  Start date: 02/21/1988  ?  Quit date: 02/20/2010  ?  Years since quitting: 11.3  ? Smokeless tobacco: Never  ?Vaping Use  ? Vaping Use: Never used  ?Substance Use Topics  ? Alcohol use: No  ? Drug use: No  ?  ? ?Allergies  ?Allergen Reactions  ? Tramadol Rash  ? ? ?Health Maintenance  ?Topic Date Due  ? OPHTHALMOLOGY EXAM  Never done  ?  Zoster Vaccines- Shingrix (1 of 2) Never done  ? COVID-19 Vaccine (2 - Moderna series) 12/11/2019  ? HEMOGLOBIN A1C  03/10/2021  ? URINE MICROALBUMIN  03/16/2021  ? INFLUENZA VACCINE  09/20/2021  ? MAMMOGRAM  02/06

## 2021-06-23 NOTE — Telephone Encounter (Signed)
You saw her last please advise ?

## 2021-06-23 NOTE — Telephone Encounter (Signed)
Pt called back. ? ?Chief Complaint: Cough ?Symptoms: ibid ?Frequency: over 1 month ?Pertinent Negatives: Patient denies sob ?Disposition: [] ED /[] Urgent Care (no appt availability in office) / [x] Appointment(In office/virtual)/ []  [place] [facility]/ [] Home Care/ [] Refused Recommended Disposition /[] [place] [facility]/ []  Follow-up with PCP ?Additional Notes: Pt is still experiencing cough - the same as on 06/15/2021. Note from that OV states the pt should have follow up ov if unresolved. Pt called to get more cough medicine. ? ?Answer Assessment - Initial Assessment Questions ?1. ONSET: "When did the cough begin?"  ?    Over 1 month ?2. SEVERITY: "How bad is the cough today?"  ?    moderate ?3. SPUTUM: "Describe the color of your sputum" (none, dry cough; clear, white, yellow, green) ?     ?4. HEMOPTYSIS: "Are you coughing up any blood?" If so ask: "How much?" (flecks, streaks, tablespoons, etc.) ?     ?5. DIFFICULTY BREATHING: "Are you having difficulty breathing?" If Yes, ask: "How bad is it?" (e.g., mild, moderate, severe)  ?  - MILD: No SOB at rest, mild SOB with walking, speaks normally in sentences, can lie down, no retractions, pulse < 100.  ?  - MODERATE: SOB at rest, SOB with minimal exertion and prefers to sit, cannot lie down flat, speaks in phrases, mild retractions, audible wheezing, pulse 100-120.  ?  - SEVERE: Very SOB at rest, speaks in single words, struggling to breathe, sitting hunched forward, retractions, pulse > 120  ?    mild ?6. FEVER: "Do you have a fever?" If Yes, ask: "What is your temperature, how was it measured, and when did it start?" ?    no ?7. CARDIAC HISTORY: "Do you have any history of heart disease?" (e.g., heart attack, congestive heart failure)  ?     ?8. LUNG HISTORY: "Do you have any history of lung disease?"  (e.g., pulmonary embolus, asthma, emphysema) ?     ?9. PE RISK FACTORS: "Do you have a history of blood clots?" (or: recent major surgery, recent prolonged  travel, bedridden) ?     ?10. OTHER SYMPTOMS: "Do you have any other symptoms?" (e.g., runny nose, wheezing, chest pain) ?       ?11. PREGNANCY: "Is there any chance you are pregnant?" "When was your last menstrual period?" ?      na ?12. TRAVEL: "Have you traveled out of the country in the last month?" (e.g., travel history, exposures) ?      Na ? ?Protocols used: Cough - Acute Productive-A-AH ? ?

## 2021-06-24 LAB — HEMOGLOBIN A1C
Hgb A1c MFr Bld: 8 % of total Hgb — ABNORMAL HIGH (ref <5.7)
Mean Plasma Glucose: 183 mg/dL
eAG (mmol/L): 10.1 mmol/L

## 2021-06-24 LAB — COMPLETE METABOLIC PANEL WITH GFR
AG Ratio: 1.6 (calc) (ref 1.0–2.5)
ALT: 35 U/L — ABNORMAL HIGH (ref 6–29)
AST: 20 U/L (ref 10–35)
Albumin: 4.1 g/dL (ref 3.6–5.1)
Alkaline phosphatase (APISO): 113 U/L (ref 37–153)
BUN: 18 mg/dL (ref 7–25)
CO2: 30 mmol/L (ref 20–32)
Calcium: 9.8 mg/dL (ref 8.6–10.4)
Chloride: 104 mmol/L (ref 98–110)
Creat: 0.87 mg/dL (ref 0.50–1.03)
Globulin: 2.6 g/dL (calc) (ref 1.9–3.7)
Glucose, Bld: 259 mg/dL — ABNORMAL HIGH (ref 65–99)
Potassium: 3.9 mmol/L (ref 3.5–5.3)
Sodium: 140 mmol/L (ref 135–146)
Total Bilirubin: 0.5 mg/dL (ref 0.2–1.2)
Total Protein: 6.7 g/dL (ref 6.1–8.1)
eGFR: 77 mL/min/{1.73_m2} (ref >=60)

## 2021-06-24 LAB — LIPID PANEL
Cholesterol: 151 mg/dL (ref <200)
HDL: 47 mg/dL — ABNORMAL LOW (ref >=50)
LDL Cholesterol (Calc): 69 mg/dL (calc)
Non-HDL Cholesterol (Calc): 104 mg/dL (calc) (ref <130)
Total CHOL/HDL Ratio: 3.2 (calc) (ref <5.0)
Triglycerides: 263 mg/dL — ABNORMAL HIGH (ref <150)

## 2021-06-24 LAB — CBC WITH DIFFERENTIAL/PLATELET
Absolute Monocytes: 593 cells/uL (ref 200–950)
Basophils Absolute: 40 cells/uL (ref 0–200)
Basophils Relative: 0.5 %
Eosinophils Absolute: 158 cells/uL (ref 15–500)
Eosinophils Relative: 2 %
HCT: 37.8 % (ref 35.0–45.0)
Hemoglobin: 12.4 g/dL (ref 11.7–15.5)
Lymphs Abs: 2757 cells/uL (ref 850–3900)
MCH: 29.8 pg (ref 27.0–33.0)
MCHC: 32.8 g/dL (ref 32.0–36.0)
MCV: 90.9 fL (ref 80.0–100.0)
MPV: 11.9 fL (ref 7.5–12.5)
Monocytes Relative: 7.5 %
Neutro Abs: 4353 cells/uL (ref 1500–7800)
Neutrophils Relative %: 55.1 %
Platelets: 209 10*3/uL (ref 140–400)
RBC: 4.16 10*6/uL (ref 3.80–5.10)
RDW: 13 % (ref 11.0–15.0)
Total Lymphocyte: 34.9 %
WBC: 7.9 10*3/uL (ref 3.8–10.8)

## 2021-06-24 LAB — MICROALBUMIN / CREATININE URINE RATIO
Creatinine, Urine: 126 mg/dL (ref 20–275)
Microalb Creat Ratio: 11 mcg/mg creat (ref <30)
Microalb, Ur: 1.4 mg/dL

## 2021-06-27 ENCOUNTER — Other Ambulatory Visit: Payer: Self-pay | Admitting: Family Medicine

## 2021-06-27 DIAGNOSIS — E119 Type 2 diabetes mellitus without complications: Secondary | ICD-10-CM

## 2021-06-27 MED ORDER — METFORMIN HCL ER (MOD) 1000 MG PO TB24: 1000.0000 mg | ORAL_TABLET | Freq: Two times a day (BID) | ORAL | 1 refills | Status: DC

## 2021-06-28 ENCOUNTER — Other Ambulatory Visit: Payer: Self-pay

## 2021-06-28 ENCOUNTER — Telehealth: Payer: Self-pay | Admitting: Family Medicine

## 2021-06-28 NOTE — Telephone Encounter (Signed)
Copied from CRM [id number]. Topic: General - Other ?>> Jun 28, 2021  3:16 PM Jaquita Rector A wrote: ?Reason for CRM: Lurena Joiner with [facility] called in to inform Danelle Berry that  metFORMIN (GLUMETZA) 1000 MG (MOD) 24 hr tablet is not covered and is $21000 asking for a new Rx for metFORMIN 500 ER tablet twice a day Please advise and call [facility] pharmacy ?

## 2021-06-29 MED ORDER — METFORMIN HCL ER 500 MG PO TB24: 1000.0000 mg | ORAL_TABLET | Freq: Two times a day (BID) | ORAL | 1 refills | Status: DC

## 2021-06-30 ENCOUNTER — Ambulatory Visit (INDEPENDENT_AMBULATORY_CARE_PROVIDER_SITE_OTHER): Payer: 59 | Admitting: Podiatry

## 2021-06-30 ENCOUNTER — Encounter: Payer: Self-pay | Admitting: Podiatry

## 2021-06-30 DIAGNOSIS — M79675 Pain in left toe(s): Secondary | ICD-10-CM | POA: Diagnosis not present

## 2021-06-30 DIAGNOSIS — M79674 Pain in right toe(s): Secondary | ICD-10-CM | POA: Diagnosis not present

## 2021-06-30 DIAGNOSIS — E119 Type 2 diabetes mellitus without complications: Secondary | ICD-10-CM | POA: Insufficient documentation

## 2021-06-30 DIAGNOSIS — B351 Tinea unguium: Secondary | ICD-10-CM

## 2021-06-30 NOTE — Progress Notes (Signed)
This patient returns to my office for at risk foot care.  This patient requires this care by a professional since this patient will be at risk due to having type 2 diabetes. This patient is unable to cut nails herself since the patient cannot reach her nails.These nails are painful walking and wearing shoes.  This patient presents for at risk foot care today.  General Appearance  Alert, conversant and in no acute stress.  Vascular  Dorsalis pedis and posterior tibial  pulses are palpable  bilaterally.  Capillary return is within normal limits  bilaterally. Temperature is within normal limits  bilaterally.  Neurologic  Senn-Weinstein monofilament wire test within normal limits  bilaterally. Muscle power within normal limits bilaterally.  Nails Thick disfigured discolored nails with subungual debris  from hallux to fifth toes bilaterally. No evidence of bacterial infection or drainage bilaterally.  Orthopedic  No limitations of motion  feet .  No crepitus or effusions noted.  No bony pathology or digital deformities noted.  Skin  normotropic skin with no porokeratosis noted bilaterally.  No signs of infections or ulcers noted.     Onychomycosis  Pain in right toes  Pain in left toes  Consent was obtained for treatment procedures.   Mechanical debridement of nails 1-5  bilaterally performed with a nail nipper.  Filed with dremel without incident.    Return office visit    3 months                  Told patient to return for periodic foot care and evaluation due to potential at risk complications.   Lorena Benham DPM   

## 2021-07-11 ENCOUNTER — Ambulatory Visit: Payer: 59 | Admitting: *Deleted

## 2021-08-29 ENCOUNTER — Ambulatory Visit: Payer: Self-pay

## 2021-08-29 NOTE — Telephone Encounter (Signed)
  Chief Complaint: glucose Symptoms: none Frequency: na Pertinent Negatives: Patient denies symptoms Disposition: [] ED /[] Urgent Care (no appt availability in office) / [] Appointment(In office/virtual)/ []  [place] [facility]/ [] Home Care/ [] Refused Recommended Disposition /[] [place] [facility]/ [x]  Follow-up with PCP Additional Notes:  Pt called back now that she has medication straightened out. She states she took her glucose and it was 35. Stated she had no symptoms and ate two sandwiches two hours ago.Had her retake her glucose and it was 190. I did tell pt signs of hypoglycemia and what to do if it occurs. Reason for Disposition . Low blood sugar definition and treatment, questions about  Answer Assessment - Initial Assessment Questions 1. SYMPTOMS: "What symptoms are you concerned about?"     No symptoms 2. ONSET:  "When did the symptoms start?"     No symptoms 3. BLOOD GLUCOSE: "What is your blood glucose level?"      Pt stated 35, had her re-check it due to her not having symptoms, second check was 190. States she ate two sandwiches at 2pm 4. USUAL RANGE: "What is your blood glucose level usually?" (e.g., usual fasting morning value, usual evening value)     High, just increased dose of Metformin. Had pt recheck glucose level and it was not 35, it was 190.  Protocols used: Diabetes - Low Blood Sugar-A-AH

## 2021-08-29 NOTE — Telephone Encounter (Signed)
Summary: discuss medication   Patient inquiring how many mg of metformin she should take, if patient can not be reached on mobile please call [phone]   Please advise     Unable to [facility]

## 2021-08-29 NOTE — Telephone Encounter (Signed)
Pt called back, she wanted to know what dosage of metformin she is to be taking. She was on 750mg  at one time but she said [place] increased dosage and pt wasn't sure if she was to keep taking the 750mg  or not so she has just started the 500mg  (2 x BID) this past Saturday. I advised pt just continue taking the 500mg  and put the 750mg  bottle that she has up so she doesn't get dosage mixed up again. Pt verbalized understanding.   Summary: discuss medication    Patient inquiring how many mg of metformin she should take, if patient can not be reached on mobile please call [phone]       Reason for Disposition  Caller has medicine question only, adult not sick, AND triager answers question  Answer Assessment - Initial Assessment Questions 1. NAME of MEDICATION: "What medicine are you calling about?"     Metformin  2. QUESTION: "What is your question?" (e.g., double dose of medicine, side effect)     What dosage is she suppose to be taking  3. PRESCRIBING HCP: "Who prescribed it?" Reason: if prescribed by specialist, call should be referred to that group.     , PA  Protocols used: Medication Question Call-A-AH

## 2021-09-02 ENCOUNTER — Ambulatory Visit: Payer: Self-pay

## 2021-09-02 NOTE — Telephone Encounter (Signed)
   Chief Complaint: High BP reading Symptoms: 1 high blood pressure reading - sometimes feels lightheaded. Frequency: today Pertinent Negatives: Patient denies chest pain, neurological deficit Disposition: [] ED /[] Urgent Care (no appt availability in office) / [x] Appointment(In office/virtual)/ []  [place] [facility]/ [] Home Care/ [] Refused Recommended Disposition /[] [place] [facility]/ []  Follow-up with PCP Additional Notes: Pt called after reading of 149/93. Pt was standing during reading. Pt retook BP after instructions for proper procedure and BP was 120/78 with a HR of 72. PT will take BP 2 times daily and record for upcoming OV.  Summary: elevated bp   Reason for CRM: Patient states her bp reading is 149/93 pulse 83   Patient inquiring if the reading is high   Please advise      Reason for Disposition  [1] Systolic BP  >= 130 OR Diastolic >= 80 AND [2] not taking BP medications  Answer Assessment - Initial Assessment Questions 1. BLOOD PRESSURE: "What is the blood pressure?" "Did you take at least two measurements 5 minutes apart?"     149/93 no 2. ONSET: "When did you take your blood pressure?"     Just now 3. HOW: "How did you obtain the blood pressure?" (e.g., visiting nurse, automatic home BP monitor)     Home monitor - standing 4. HISTORY: "Do you have a history of high blood pressure?"     Not that she knows of. 5. MEDICATIONS: "Are you taking any medications for blood pressure?" "Have you missed any doses recently?"     no 6. OTHER SYMPTOMS: "Do you have any symptoms?" (e.g., headache, chest pain, blurred vision, difficulty breathing, weakness)     dizziness 7. PREGNANCY: "Is there any chance you are pregnant?" "When was your last menstrual period?"     no  Protocols used: Blood Pressure - High-A-AH

## 2021-09-12 ENCOUNTER — Ambulatory Visit: Payer: 59 | Admitting: Family Medicine

## 2021-09-14 ENCOUNTER — Ambulatory Visit: Payer: Self-pay | Admitting: *Deleted

## 2021-09-14 NOTE — Telephone Encounter (Signed)
Summary: Dizzy, Lightheaded and stuffy   Patient called in requesting if it with be ok to take Loratadine 10mg  because she has been dizzy, lightheaded and very stuffy nose. Is it ok if she takes this made. It was prescribed to her in April but she never started taking it. Please assist patient further.      Reason for Disposition  [1] MODERATE dizziness (e.g., interferes with normal activities) AND [2] has NOT been evaluated by doctor (or NP/PA) for this  (Exception: Dizziness caused by heat exposure, sudden standing, or poor fluid intake.)  Answer Assessment - Initial Assessment Questions 1. DESCRIPTION: "Describe your dizziness."     lightheaded 2. LIGHTHEADED: "Do you feel lightheaded?" (e.g., somewhat faint, woozy, weak upon standing)     Woozy 3. VERTIGO: "Do you feel like either you or the room is spinning or tilting?" (i.e. vertigo)     sometimes 4. SEVERITY: "How bad is it?"  "Do you feel like you are going to faint?" "Can you stand and walk?"   - MILD: Feels slightly dizzy, but walking normally.   - MODERATE: Feels unsteady when walking, but not falling; interferes with normal activities (e.g., school, work).   - SEVERE: Unable to walk without falling, or requires assistance to walk without falling; feels like passing out now.      Mild/moderate 5. ONSET:  "When did the dizziness begin?"     Over 1 month 6. AGGRAVATING FACTORS: "Does anything make it worse?" (e.g., standing, change in head position)     movement 7. HEART RATE: "Can you tell me your heart rate?" "How many beats in 15 seconds?"  (Note: not all patients can do this)       normal 8. CAUSE: "What do you think is causing the dizziness?"     Unsure- thought related to diabetes- now thinks sinus 9. RECURRENT SYMPTOM: "Have you had dizziness before?" If Yes, ask: "When was the last time?" "What happened that time?"     no 10. OTHER SYMPTOMS: "Do you have any other symptoms?" (e.g., fever, chest pain, vomiting, diarrhea,  bleeding)       Nasal congestion, diarrhea 11. PREGNANCY: "Is there any chance you are pregnant?" "When was your last menstrual period?"  Protocols used: Dizziness - Lightheadedness-A-AH

## 2021-09-14 NOTE — Telephone Encounter (Signed)
  Chief Complaint: dizziness Symptoms: dizziness, nasal congestion Frequency: 1 month Pertinent Negatives: Patient denies fever, chest pain, vomiting, bleeding Disposition: [] ED /[] Urgent Care (no appt availability in office) / [] Appointment(In office/virtual)/ []  [place] [facility]/ [] Home Care/ [x] Refused Recommended Disposition /[] [place] [facility]/ []  Follow-up with PCP Additional Notes: Patient is calling to report dizziness- has not been evaluated. Patient also wants to know why PCP told her not to take Loratadine. ( Could not find notes regarding this in chart) No open appointment and patient declines UC

## 2021-09-15 ENCOUNTER — Ambulatory Visit
Admission: EM | Admit: 2021-09-15 | Discharge: 2021-09-15 | Disposition: A | Discharge status: Home / Self Care | Payer: 59 | Attending: Emergency Medicine | Admitting: Emergency Medicine

## 2021-09-15 ENCOUNTER — Encounter: Payer: Self-pay | Admitting: Emergency Medicine

## 2021-09-15 DIAGNOSIS — H6123 Impacted cerumen, bilateral: Secondary | ICD-10-CM

## 2021-09-15 DIAGNOSIS — R42 Dizziness and giddiness: Secondary | ICD-10-CM

## 2021-09-15 LAB — POCT FASTING CBG KUC MANUAL ENTRY: POCT Glucose (KUC): 127 mg/dL — AB (ref 70–99)

## 2021-09-15 MED ORDER — AMOXICILLIN-POT CLAVULANATE 875-125 MG PO TABS: 1.0000 | ORAL_TABLET | Freq: Two times a day (BID) | ORAL | 0 refills | Status: AC

## 2021-09-15 MED ORDER — MECLIZINE HCL 12.5 MG PO TABS: 12.5000 mg | ORAL_TABLET | Freq: Three times a day (TID) | ORAL | 0 refills | Status: AC | PRN

## 2021-09-15 MED ORDER — ONDANSETRON HCL 4 MG/2ML IJ SOLN
4.0000 mg | Freq: Once | INTRAMUSCULAR | Status: AC
  Administered 2021-09-15: 4 mg via INTRAMUSCULAR

## 2021-09-15 NOTE — ED Triage Notes (Signed)
Patient c/o sinus pressure x " a few months".   Patient c/o lightheadedness x " a few months".   Patient endorses lightheadedness is constant.   Patient denies Chest Pain, Headache, or Palpitations.   Patient denies fever. Patient denies ear pain. Patient denies sore throat.   History of DM.

## 2021-09-15 NOTE — Discharge Instructions (Addendum)
On exam your ears are clogged with wax you have also described some tenderness within the sinus cavity and therefore we will help provide coverage for both which can contribute to your dizziness and lightheadedness  Your ears have been cleaned thoroughly with water irrigation by nursing staff in office today moving forward you may use over-the-counter Debrox drops for cleaning, avoid use of Q-tips  Begin use of Augmentin every morning and every evening for the next 10 days to clear any bacteria from the sinus passages  Also begin over-the-counter Flonase which is a steroid nasal bradycardia to help clear reduce swelling within the sinuses  You may use meclizine every 8 hours as needed for dizziness and lightheadedness  Ensure that you are drinking adequate amount of water throughout the day  To help stabilize the body weight at least 10 seconds before changes in movement  Please follow-up with your primary doctor within the next 1 to 2 weeks for reevaluation of symptoms

## 2021-09-15 NOTE — ED Provider Notes (Signed)
Brenda Ellison    CSN: [id number] Arrival date & time: 09/15/21  1004      History   Chief Complaint Chief Complaint  Patient presents with   Facial Pain   Dizziness    HPI Brenda Ellison is a 61 y.o. female.   Patient presents with intermittent dizziness and lightheadedness for 1 month that has become constant.  Feels as if the room is moving and there is a sensation of fullness to the head.  Associated tenderness along the nasal cavity.  Tolerating food and liquid.  No known sick contacts.  Has not attempted treatment of symptoms.  Denies fever, chills, body aches, URI symptoms, memory or speech changes, generalized weakness, headaches, visual disturbance.  Past Medical History:  Diagnosis Date   Arthritis pain    Bilateral leg pain    Chronic nasal congestion    Diabetes mellitus without complication (HCC)    Dizziness of unknown cause    Heart murmur previously undiagnosed    Osteoarthritis, hand    Prediabetes 07/12/2016    Patient Active Problem List   Diagnosis Date Noted   Diabetes mellitus without complication ([place]) 41/93/7902   Sarcoidosis of lung ([place]) 06/06/2021   Pelvic pain 11/29/2020   Gastroesophageal reflux disease 03/18/2020   New onset type 2 diabetes mellitus ([place]) 02/19/2020   Allergic rhinitis 02/17/2020   Thoracic aortic aneurysm without rupture ([place]) 09/23/2019   Small airways disease 05/13/2019   Class 1 obesity with serious comorbidity and body mass index (BMI) of 31.0 to 31.9 in adult 05/13/2019   Family history of early CAD 05/13/2019   Lung nodule 04/01/2019   Wheeze 04/01/2019   Exertional shortness of breath 04/01/2019   Mediastinal lymphadenopathy 04/01/2019   Hilar lymphadenopathy 04/01/2019   Cervical radiculopathy 10/25/2018   Stable angina pectoris ([place]) 09/30/2018   Overweight (BMI 25.0-29.9) 04/23/2018   DDD (degenerative disc disease), lumbar 11/06/2017   Cardiac murmur 03/16/2017   Lumbar radiculopathy 03/16/2017    Osteoarthritis 06/07/2015   Hyperlipidemia 06/01/2015   Seasonal allergies 01/12/2015   Urticaria 11/30/2014   Degenerative arthritis of hip 10/19/2014   Arthritis of hand, degenerative 10/05/2014   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   Moderate mitral insufficiency 05/01/2014    Past Surgical History:  Procedure Laterality Date   COLONOSCOPY WITH PROPOFOL N/A 05/15/2017   Procedure: COLONOSCOPY WITH PROPOFOL;  Surgeon: Jonathon Bellows, MD;  Location[facility] ENDOSCOPY;  Service: Gastroenterology;  Laterality: N/A;   HAND RECONSTRUCTION Bilateral    as a child    OB History     Gravida  3   Para  3   Term  3   Preterm      AB      Living  3      SAB      IAB      Ectopic      Multiple      Live Births  3            Home Medications    Prior to Admission medications   Medication Sig Start Date End Date Taking? Authorizing Provider  amoxicillin-clavulanate (AUGMENTIN) 875-125 MG tablet Take 1 tablet by mouth every 12 (twelve) hours for 10 days. 09/15/21 09/25/21 Yes Monique Hefty R, NP  atorvastatin (LIPITOR) 20 MG tablet Take 1 tablet (20 mg total) by mouth at bedtime. 10/01/20  Yes Delsa Grana, PA-C  budesonide-formoterol (SYMBICORT) 160-4.5 MCG/ACT inhaler Inhale 2 puffs into the lungs 2 (two) times daily. 06/15/21  Yes Pender,  Myna Hidalgo, FNP  celecoxib (CELEBREX) 100 MG capsule Take 100 mg by mouth 2 (two) times daily. 02/14/20  Yes [provider]  Cholecalciferol (VITAMIN D3) 2000 UNITS capsule Take 2,000 Units by mouth daily.   Yes [provider]  gabapentin (NEURONTIN) 100 MG capsule Take 100 mg by mouth 2 (two) times daily. 03/28/21  Yes [provider]  meclizine (ANTIVERT) 12.5 MG tablet Take 1 tablet (12.5 mg total) by mouth 3 (three) times daily as needed for dizziness. 09/15/21  Yes Lief Palmatier, Leitha Schuller, NP  metFORMIN (GLUCOPHAGE-XR) 500 MG 24 hr tablet Take 2 tablets (1,000 mg total) by mouth 2 (two) times daily with a meal. 06/29/21  Yes  Tapia, Leisa, PA-C  pantoprazole (PROTONIX) 20 MG tablet Take by mouth. 10/01/20  Yes [provider]  albuterol (VENTOLIN HFA) 108 (90 Base) MCG/ACT inhaler Inhale 2 puffs into the lungs every 4 (four) hours as needed for wheezing or shortness of breath. 06/06/21   Delsa Grana, PA-C  benzonatate (TESSALON) 100 MG capsule Take 1-2 capsules (100-200 mg total) by mouth 3 (three) times daily as needed for cough. 06/23/21   Delsa Grana, PA-C  blood glucose meter kit and supplies Dispense based on patient and insurance preference. Use up to four times daily as directed. (FOR ICD-10 E10.9, E11.9). 09/07/20   Myles Gip, DO  levocetirizine (XYZAL) 5 MG tablet Take 1 tablet (5 mg total) by mouth every evening. 06/23/21   Delsa Grana, PA-C  montelukast (SINGULAIR) 10 MG tablet Take 1 tablet (10 mg total) by mouth at bedtime. 06/23/21   Delsa Grana, PA-C  pantoprazole (PROTONIX) 20 MG tablet Take 1 tablet (20 mg total) by mouth every morning. One hour before breakfast 10/01/20 10/31/20  Delsa Grana, PA-C  promethazine-dextromethorphan (PROMETHAZINE-DM) 6.25-15 MG/5ML syrup Take 2.5-5 mLs by mouth 3 (three) times daily as needed for cough. 06/23/21   Delsa Grana, PA-C    Family History Family History  Problem Relation Age of Onset   Diabetes Brother    Heart attack Mother    Breast cancer Neg Hx    Ovarian cancer Neg Hx    Colon cancer Neg Hx    Heart disease Neg Hx     Social History Social History   Tobacco Use   Smoking status: Former    Packs/day: 1.00    Years: 22.00    Total pack years: 22.00    Types: Cigarettes    Start date: 02/21/1988    Quit date: 02/20/2010    Years since quitting: 11.5   Smokeless tobacco: Never  Vaping Use   Vaping Use: Never used  Substance Use Topics   Alcohol use: No   Drug use: No     Allergies   Tramadol   Review of Systems Review of Systems Defer to HPI   Physical Exam Triage Vital Signs ED Triage Vitals  Enc Vitals Group     BP  09/15/21 1011 124/84     Pulse Rate 09/15/21 1011 73     Resp 09/15/21 1011 16     Temp 09/15/21 1011 98 F (36.7 C)     Temp Source 09/15/21 1011 Oral     SpO2 09/15/21 1011 97 %     Weight --      Height --      Head Circumference --      Peak Flow --      Pain Score 09/15/21 1033 10     Pain Loc --  Pain Edu? --      Excl. in [place]? --    No data found.  Updated Vital Signs BP 124/84 (BP Location: Left Arm)   Pulse 73   Temp 98 F (36.7 C) (Oral)   Resp 16   SpO2 97%   Visual Acuity Right Eye Distance:   Left Eye Distance:   Bilateral Distance:    Right Eye Near:   Left Eye Near:    Bilateral Near:     Physical Exam Constitutional:      Appearance: Normal appearance.  HENT:     Head: Normocephalic.     Right Ear: There is impacted cerumen.     Left Ear: There is impacted cerumen.     Ears:     Comments: Partial occlusion to the right ear, only able to visualize 40% of the tympanic membrane    Mouth/Throat:     Mouth: Mucous membranes are moist.     Pharynx: Oropharynx is clear.  Eyes:     Extraocular Movements: Extraocular movements intact.     Conjunctiva/sclera: Conjunctivae normal.     Pupils: Pupils are equal, round, and reactive to light.  Cardiovascular:     Rate and Rhythm: Normal rate and regular rhythm.     Pulses: Normal pulses.     Heart sounds: Normal heart sounds.  Pulmonary:     Effort: Pulmonary effort is normal.     Breath sounds: Normal breath sounds.  Neurological:     General: No focal deficit present.     Mental Status: She is alert and oriented to person, place, and time. Mental status is at baseline.     Cranial Nerves: No cranial nerve deficit.     Sensory: No sensory deficit.     Motor: No weakness.     Gait: Gait normal.      UC Treatments / Results  Labs (all labs ordered are listed, but only abnormal results are displayed) Labs Reviewed  POCT FASTING CBG [facility] MANUAL ENTRY - Abnormal; Notable for the following  components:      Result Value   POCT Glucose ([facility]) 127 (*)    All other components within normal limits    EKG   Radiology No results found.  Procedures Procedures (including critical care time)  Medications Ordered in UC Medications - No data to display  Initial Impression / Assessment and Plan / UC Course  I have reviewed the triage vital signs and the nursing notes.  Pertinent labs & imaging results that were available during my care of the patient were reviewed by me and considered in my medical decision making (see chart for details).  Impacted cerumen of both ears, lightheadedness, dizziness  Vital signs are stable and patient is in no signs of distress, as above ears are occluded with cerumen, ear irrigation has been completed and office, successful, on irrigation of the left ear patient had become dizzy causing her to vomit, 4 mg of Zofran given, vitals are stable at that time systolic blood pressure in the 150s, patient endorses feeling better after vomiting, prescribed Augmentin as there is description of sinus tenderness therefore we will provide bacterial coverage as this may be aiding to symptoms, meclizine prescribed for treatment of dizziness, advised increase fluid intake the use of water and changing positions slowly waiting at least 10 seconds,  recommended follow-up with PCP in 1 week for reevaluation Final Clinical Impressions(s) / UC Diagnoses   Final diagnoses:  Lightheadedness  Dizziness  Impacted  cerumen of both ears     Discharge Instructions      On exam your ears are clogged with wax you have also described some tenderness within the sinus cavity and therefore we will help provide coverage for both which can contribute to your dizziness and lightheadedness  Your ears have been cleaned thoroughly with water irrigation by nursing staff in office today moving forward you may use over-the-counter Debrox drops for cleaning, avoid use of Q-tips  Begin  use of Augmentin every morning and every evening for the next 10 days to clear any bacteria from the sinus passages  Also begin over-the-counter Flonase which is a steroid nasal bradycardia to help clear reduce swelling within the sinuses  You may use meclizine every 8 hours as needed for dizziness and lightheadedness  Ensure that you are drinking adequate amount of water throughout the day  To help stabilize the body weight at least 10 seconds before changes in movement  Please follow-up with your primary doctor within the next 1 to 2 weeks for reevaluation of symptoms   ED Prescriptions     Medication Sig Dispense Auth. Provider   amoxicillin-clavulanate (AUGMENTIN) 875-125 MG tablet Take 1 tablet by mouth every 12 (twelve) hours for 10 days. 20 tablet Harriet Bollen, Vincente Liberty R, NP   meclizine (ANTIVERT) 12.5 MG tablet Take 1 tablet (12.5 mg total) by mouth 3 (three) times daily as needed for dizziness. 30 tablet Hans Eden, NP      PDMP not reviewed this encounter.   Hans Eden, [place] 09/15/21 [phone]

## 2021-09-19 ENCOUNTER — Other Ambulatory Visit: Payer: Self-pay

## 2021-09-19 ENCOUNTER — Ambulatory Visit (INDEPENDENT_AMBULATORY_CARE_PROVIDER_SITE_OTHER): Payer: 59 | Admitting: Nurse Practitioner

## 2021-09-19 ENCOUNTER — Encounter: Payer: Self-pay | Admitting: Nurse Practitioner

## 2021-09-19 VITALS — BP 128/78 | HR 95 | Temp 97.9°F | Resp 16 | Ht 64.0 in | Wt 180.3 lb

## 2021-09-19 DIAGNOSIS — Z09 Encounter for follow-up examination after completed treatment for conditions other than malignant neoplasm: Secondary | ICD-10-CM

## 2021-09-19 DIAGNOSIS — J0140 Acute pansinusitis, unspecified: Secondary | ICD-10-CM | POA: Diagnosis not present

## 2021-09-19 DIAGNOSIS — J3089 Other allergic rhinitis: Secondary | ICD-10-CM

## 2021-09-19 DIAGNOSIS — H6123 Impacted cerumen, bilateral: Secondary | ICD-10-CM

## 2021-09-19 DIAGNOSIS — M545 Low back pain, unspecified: Secondary | ICD-10-CM

## 2021-09-19 DIAGNOSIS — R42 Dizziness and giddiness: Secondary | ICD-10-CM

## 2021-09-19 MED ORDER — FLUTICASONE PROPIONATE 50 MCG/ACT NA SUSP: 2.0000 | Freq: Every day | NASAL | 6 refills | Status: DC

## 2021-09-19 MED ORDER — KETOROLAC TROMETHAMINE 60 MG/2ML IM SOLN: 60.0000 mg | Freq: Once | INTRAMUSCULAR | Status: DC

## 2021-09-19 MED ORDER — TIZANIDINE HCL 4 MG PO TABS: 4.0000 mg | ORAL_TABLET | Freq: Three times a day (TID) | ORAL | 2 refills | Status: DC | PRN

## 2021-09-19 MED ORDER — KETOROLAC TROMETHAMINE 60 MG/2ML IM SOLN
60.0000 mg | Freq: Once | INTRAMUSCULAR | Status: AC
  Administered 2021-09-19: 60 mg via INTRAMUSCULAR

## 2021-09-19 NOTE — Addendum Note (Signed)
Addended by: Davene Costain on: 09/19/2021 03:04 PM   Modules accepted: Orders

## 2021-09-19 NOTE — Progress Notes (Signed)
BP 128/78   Pulse 95   Temp 97.9 F (36.6 C) (Oral)   Resp 16   Ht 5\' 4"  (1.626 m)   Wt 180 lb 4.8 oz (81.8 kg)   SpO2 97%   BMI 30.95 kg/m    Subjective:    Patient ID: , female    DOB: 10/30/1960, 61 y.o.   MRN: [id number]  HPI: Brenda Ellison is a 61 y.o. female  Chief Complaint  Patient presents with   Follow-up    Urgent care   Urgent care follow-up/dizziness/impacted cerumen/sinus infection: Patient was seen at urgent care on 09/15/2021.  She went in due to dizziness.  Upon assessment they noted that she had impacted cerumen of both ears.  They were able to clean out her ears.  She did have the irrigation of her left ear she did become dizzy and vomited they gave her 4mg  of Zofran.  Also prescribed Augmentin due to sinus tenderness and meclizine for dizziness.  States she has been taking her Augmentin.  Patient reports she has not been taking allergy medication.  Discussed that at her last visit with 09/17/2021, PA that she was supposed to start Xyzal.  Also recommended patient's start taking Flonase.  Patient states she is feeling a little bit better but she still feels a lot of pressure in her head.  Discussed ways to improve this.  In agreement with plan.  Low back pain: Patient reports she is got left-sided lower back pain.  Patient states has been going on for couple days.  Patient denies any urinary complaints, fever, incontinence, or numbness or tingling down her legs.  Patient denies any trauma.  Patient states she typically sleeps on the couch.  We will give patient prescription for muscle relaxer and give her a shot of Toradol.  Heat therapy and massage.  Relevant past medical, surgical, family and social history reviewed and updated as indicated. Interim medical history since our last visit reviewed. Allergies and medications reviewed and updated.  Review of Systems  Constitutional: Negative for fever or weight change.  HEENT: Positive for nasal  congestion. Respiratory: Negative for cough and shortness of breath.   Cardiovascular: Negative for chest pain or palpitations.  Gastrointestinal: Negative for abdominal pain, no bowel changes.  Musculoskeletal: Negative for gait problem or joint swelling.  Positive for left-sided low back pain Skin: Negative for rash.  Neurological: positive for dizziness, positive for headache.  No other specific complaints in a complete review of systems (except as listed in HPI above).      Objective:    BP 128/78   Pulse 95   Temp 97.9 F (36.6 C) (Oral)   Resp 16   Ht 5\' 4"  (1.626 m)   Wt 180 lb 4.8 oz (81.8 kg)   SpO2 97%   BMI 30.95 kg/m   Wt Readings from Last 3 Encounters:  09/19/21 180 lb 4.8 oz (81.8 kg)  06/23/21 188 lb (85.3 kg)  06/15/21 186 lb 9.6 oz (84.6 kg)    Physical Exam  Constitutional: Patient appears well-developed and well-nourished. Obese  No distress.  HEENT: head atraumatic, normocephalic, pupils equal and reactive to light, ears TMs clear, neck supple, throat within normal limits Cardiovascular: Normal rate, regular rhythm and normal heart sounds.  No murmur heard. No BLE edema. Pulmonary/Chest: Effort normal and breath sounds normal. No respiratory distress. Abdominal: Soft.  There is no tenderness. MSK: left side lower back tenderness, no CVA tenderness Psychiatric: Patient has  a normal mood and affect. behavior is normal. Judgment and thought content normal.  Results for orders placed or performed during the hospital encounter of 09/15/21  POCT CBG (manual entry)  Result Value Ref Range   POCT Glucose ([facility]) 127 (A) 70 - 99 mg/dL      Assessment & Plan:   Problem List Items Addressed This Visit       Respiratory   Allergic rhinitis   Relevant Medications   fluticasone (FLONASE) 50 MCG/ACT nasal spray   Other Visit Diagnoses     Dizziness    -  Primary   Proving continue taking antibiotics, take your allergy medication and add Flonase.   Impacted  cerumen of both ears       TMs clear today.  Continue using allergy medication and Flonase.   Acute non-recurrent pansinusitis       Proving continue taking antibiotics, take your allergy medication and add Flonase.   Relevant Medications   fluticasone (FLONASE) 50 MCG/ACT nasal spray   Follow-up exam after treatment       improving, contimue antibioitics   Acute left-sided low back pain without sciatica       Will give injection of toradol, prescription for muscle relaxer, use heat therapy and massage   Relevant Medications   tiZANidine (ZANAFLEX) 4 MG tablet   ketorolac (TORADOL) injection 60 mg        Follow up plan: Return if symptoms worsen or fail to improve.

## 2021-09-28 ENCOUNTER — Encounter: Payer: Self-pay | Admitting: Family Medicine

## 2021-09-28 ENCOUNTER — Ambulatory Visit (INDEPENDENT_AMBULATORY_CARE_PROVIDER_SITE_OTHER): Payer: 59 | Admitting: Family Medicine

## 2021-09-28 VITALS — BP 124/80 | HR 97 | Temp 98.0°F | Resp 18 | Ht 64.0 in | Wt 178.7 lb

## 2021-09-28 DIAGNOSIS — E119 Type 2 diabetes mellitus without complications: Secondary | ICD-10-CM | POA: Diagnosis not present

## 2021-09-28 NOTE — Progress Notes (Unsigned)
Name: Brenda Ellison   MRN: [id number]    DOB: 04-May-1960   Date:09/28/2021       Progress Note  Chief Complaint  Patient presents with   Follow-up   Diabetes     Subjective:   Brenda Ellison is a 61 y.o. female, presents to clinic for DM follow up  Blood sugars highest 140-150, lowest 117, improved since increasing metformin and working on diet  Some weight loss Wt Readings from Last 5 Encounters:  09/28/21 178 lb 11.2 oz (81.1 kg)  09/19/21 180 lb 4.8 oz (81.8 kg)  06/23/21 188 lb (85.3 kg)  06/15/21 186 lb 9.6 oz (84.6 kg)  06/06/21 188 lb 12.8 oz (85.6 kg)   BMI Readings from Last 5 Encounters:  09/28/21 30.67 kg/m  09/19/21 30.95 kg/m  06/23/21 32.27 kg/m  06/15/21 32.03 kg/m  06/06/21 32.41 kg/m   Metformin dose increased and tolerated 1000 mg BID   DM diagnosed a few years ago and up until May was well controlled  Denies: Polyuria, polydipsia, vision changes, neuropathy, hypoglycemia Recent pertinent labs: Lab Results  Component Value Date   HGBA1C 8.0 (H) 06/23/2021   HGBA1C 6.5 (A) 09/07/2020   HGBA1C 6.9 (A) 06/15/2020   Lab Results  Component Value Date   MICROALBUR 1.4 06/23/2021   LDLCALC 69 06/23/2021   CREATININE 0.87 06/23/2021        Current Outpatient Medications:    albuterol (VENTOLIN HFA) 108 (90 Base) MCG/ACT inhaler, Inhale 2 puffs into the lungs every 4 (four) hours as needed for wheezing or shortness of breath., Disp: 18 g, Rfl: 3   atorvastatin (LIPITOR) 20 MG tablet, Take 1 tablet (20 mg total) by mouth at bedtime., Disp: 90 tablet, Rfl: 3   budesonide-formoterol (SYMBICORT) 160-4.5 MCG/ACT inhaler, Inhale 2 puffs into the lungs 2 (two) times daily., Disp: 1 each, Rfl: 0   celecoxib (CELEBREX) 100 MG capsule, Take 100 mg by mouth 2 (two) times daily., Disp: , Rfl:    Cholecalciferol (VITAMIN D3) 2000 UNITS capsule, Take 2,000 Units by mouth daily., Disp: , Rfl:    fluticasone (FLONASE) 50 MCG/ACT nasal spray, Place 2 sprays  into both nostrils daily., Disp: 16 g, Rfl: 6   gabapentin (NEURONTIN) 100 MG capsule, Take 100 mg by mouth 2 (two) times daily., Disp: , Rfl:    levocetirizine (XYZAL) 5 MG tablet, Take 1 tablet (5 mg total) by mouth every evening., Disp: 90 tablet, Rfl: 1   meclizine (ANTIVERT) 12.5 MG tablet, Take 1 tablet (12.5 mg total) by mouth 3 (three) times daily as needed for dizziness., Disp: 30 tablet, Rfl: 0   metFORMIN (GLUCOPHAGE-XR) 500 MG 24 hr tablet, Take 2 tablets (1,000 mg total) by mouth 2 (two) times daily with a meal., Disp: 360 tablet, Rfl: 1   montelukast (SINGULAIR) 10 MG tablet, Take 1 tablet (10 mg total) by mouth at bedtime., Disp: 30 tablet, Rfl: 3   pantoprazole (PROTONIX) 20 MG tablet, Take by mouth., Disp: , Rfl:    promethazine-dextromethorphan (PROMETHAZINE-DM) 6.25-15 MG/5ML syrup, Take 2.5-5 mLs by mouth 3 (three) times daily as needed for cough., Disp: 118 mL, Rfl: 1   tiZANidine (ZANAFLEX) 4 MG tablet, Take 1 tablet (4 mg total) by mouth every 8 (eight) hours as needed for muscle spasms (muscle tightness)., Disp: 90 tablet, Rfl: 2   benzonatate (TESSALON) 100 MG capsule, Take 1-2 capsules (100-200 mg total) by mouth 3 (three) times daily as needed for cough. (Patient not taking: Reported on  09/19/2021), Disp: 30 capsule, Rfl: 1   blood glucose meter kit and supplies, Dispense based on patient and insurance preference. Use up to four times daily as directed. (FOR ICD-10 E10.9, E11.9). (Patient not taking: Reported on 09/19/2021), Disp: 1 each, Rfl: 0   pantoprazole (PROTONIX) 20 MG tablet, Take 1 tablet (20 mg total) by mouth every morning. One hour before breakfast, Disp: 30 tablet, Rfl: 2  Patient Active Problem List   Diagnosis Date Noted   Diabetes mellitus without complication ([place]) 00/92/3300   Sarcoidosis of lung ([place]) 06/06/2021   Pelvic pain 11/29/2020   Gastroesophageal reflux disease 03/18/2020   New onset type 2 diabetes mellitus ([place]) 02/19/2020   Allergic  rhinitis 02/17/2020   Thoracic aortic aneurysm without rupture ([place]) 09/23/2019   Small airways disease 05/13/2019   Class 1 obesity with serious comorbidity and body mass index (BMI) of 31.0 to 31.9 in adult 05/13/2019   Family history of early CAD 05/13/2019   Lung nodule 04/01/2019   Wheeze 04/01/2019   Exertional shortness of breath 04/01/2019   Mediastinal lymphadenopathy 04/01/2019   Hilar lymphadenopathy 04/01/2019   Cervical radiculopathy 10/25/2018   Stable angina pectoris ([place]) 09/30/2018   Overweight (BMI 25.0-29.9) 04/23/2018   DDD (degenerative disc disease), lumbar 11/06/2017   Cardiac murmur 03/16/2017   Lumbar radiculopathy 03/16/2017   Osteoarthritis 06/07/2015   Hyperlipidemia 06/01/2015   Seasonal allergies 01/12/2015   Urticaria 11/30/2014   Degenerative arthritis of hip 10/19/2014   Arthritis of hand, degenerative 10/05/2014   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   Moderate mitral insufficiency 05/01/2014    Past Surgical History:  Procedure Laterality Date   COLONOSCOPY WITH PROPOFOL N/A 05/15/2017   Procedure: COLONOSCOPY WITH PROPOFOL;  Surgeon: Jonathon Bellows, MD;  Location: [facility] ENDOSCOPY;  Service: Gastroenterology;  Laterality: N/A;   HAND RECONSTRUCTION Bilateral    as a child    Family History  Problem Relation Age of Onset   Diabetes Brother    Heart attack Mother    Breast cancer Neg Hx    Ovarian cancer Neg Hx    Colon cancer Neg Hx    Heart disease Neg Hx     Social History   Tobacco Use   Smoking status: Former    Packs/day: 1.00    Years: 22.00    Total pack years: 22.00    Types: Cigarettes    Start date: 02/21/1988    Quit date: 02/20/2010    Years since quitting: 11.6   Smokeless tobacco: Never  Vaping Use   Vaping Use: Never used  Substance Use Topics   Alcohol use: No   Drug use: No     Allergies  Allergen Reactions   Tramadol Rash    Health Maintenance  Topic Date Due   OPHTHALMOLOGY EXAM  Never done   INFLUENZA VACCINE   09/20/2021   COVID-19 Vaccine (2 - Moderna series) 10/05/2021 (Originally 01/08/2020)   Zoster Vaccines- Shingrix (1 of 2) 12/20/2021 (Originally 04/28/2012)   HEMOGLOBIN A1C  12/24/2021   MAMMOGRAM  03/28/2022   FOOT EXAM  03/31/2022   URINE MICROALBUMIN  06/24/2022   PAP SMEAR-Modifier  11/30/2023   COLONOSCOPY (Pts 45-54yrs Insurance coverage will need to be confirmed)  05/16/2027   TETANUS/TDAP  08/19/2029   Hepatitis C Screening  Completed   HIV Screening  Completed   HPV VACCINES  Aged Out    Chart Review Today: I personally reviewed active problem list, medication list, allergies, family history, social history, health maintenance, notes from last  encounter, lab results, imaging with the patient/caregiver today.   Review of Systems  Constitutional: Negative.   HENT: Negative.    Eyes: Negative.   Respiratory: Negative.    Cardiovascular: Negative.   Gastrointestinal: Negative.   Endocrine: Negative.   Genitourinary: Negative.   Musculoskeletal: Negative.   Skin: Negative.   Allergic/Immunologic: Negative.   Neurological: Negative.   Hematological: Negative.   Psychiatric/Behavioral: Negative.    All other systems reviewed and are negative.    Objective:   Vitals:   09/28/21 1450  BP: 124/80  Pulse: 97  Resp: 18  Temp: 98 F (36.7 C)  TempSrc: Oral  SpO2: 95%  Weight: 178 lb 11.2 oz (81.1 kg)  Height: 5' 4"  (1.626 m)    Body mass index is 30.67 kg/m.  Physical Exam Vitals and nursing note reviewed.  Constitutional:      General: She is not in acute distress.    Appearance: Normal appearance. She is well-developed. She is obese. She is not ill-appearing, toxic-appearing or diaphoretic.     Comments: Pleasant, alert, eating a burrito throughout visit  HENT:     Head: Normocephalic and atraumatic.     Nose: Nose normal.  Eyes:     General:        Right eye: No discharge.        Left eye: No discharge.     Conjunctiva/sclera: Conjunctivae normal.   Neck:     Trachea: No tracheal deviation.  Cardiovascular:     Rate and Rhythm: Normal rate and regular rhythm.     Pulses: Normal pulses.     Heart sounds: Normal heart sounds. No murmur heard.    No friction rub. No gallop.  Pulmonary:     Effort: Pulmonary effort is normal. No respiratory distress.     Breath sounds: Normal breath sounds. No stridor.  Abdominal:     General: Bowel sounds are normal. There is no distension.     Palpations: Abdomen is soft.  Skin:    General: Skin is warm and dry.     Findings: No rash.  Neurological:     Mental Status: She is alert. Mental status is at baseline.     Motor: No abnormal muscle tone.     Coordination: Coordination normal.  Psychiatric:        Mood and Affect: Mood normal.        Behavior: Behavior normal.         Assessment & Plan:   Problem List Items Addressed This Visit       Endocrine   Type 2 diabetes mellitus without complication, without long-term current use of insulin ([place]) - Primary    Dx a few years ago, but since then well controlled until last OV Metformin was increased She states shes been working on diet, some weight loss, reported CBGs 117-150, would be improved or well controlled A1C Labs due today to recheck Referral for DM education /nutritionist consult put in again Tx plan may change pending results      Relevant Orders   Hemoglobin A1c (Completed)   Referral to Nutrition and Diabetes Services     Return in about 3 months (around 12/29/2021) for Routine follow-up.   Delsa Grana, PA-C 09/28/21 3:07 PM

## 2021-09-29 ENCOUNTER — Encounter: Payer: Self-pay | Admitting: Family Medicine

## 2021-09-29 LAB — HEMOGLOBIN A1C
Hgb A1c MFr Bld: 8.3 % of total Hgb — ABNORMAL HIGH (ref <5.7)
Mean Plasma Glucose: 192 mg/dL
eAG (mmol/L): 10.6 mmol/L

## 2021-09-29 NOTE — Assessment & Plan Note (Signed)
Dx a few years ago, but since then well controlled until last OV Metformin was increased She states shes been working on diet, some weight loss, reported CBGs 117-150, would be improved or well controlled A1C Labs due today to recheck Referral for DM education /nutritionist consult put in again Tx plan may change pending results

## 2021-10-06 ENCOUNTER — Ambulatory Visit (INDEPENDENT_AMBULATORY_CARE_PROVIDER_SITE_OTHER): Payer: 59 | Admitting: Podiatry

## 2021-10-06 ENCOUNTER — Encounter: Payer: Self-pay | Admitting: Podiatry

## 2021-10-06 DIAGNOSIS — E119 Type 2 diabetes mellitus without complications: Secondary | ICD-10-CM | POA: Diagnosis not present

## 2021-10-06 DIAGNOSIS — B351 Tinea unguium: Secondary | ICD-10-CM | POA: Diagnosis not present

## 2021-10-06 DIAGNOSIS — M79675 Pain in left toe(s): Secondary | ICD-10-CM | POA: Diagnosis not present

## 2021-10-06 DIAGNOSIS — M79674 Pain in right toe(s): Secondary | ICD-10-CM | POA: Diagnosis not present

## 2021-10-06 NOTE — Progress Notes (Signed)
This patient returns to my office for at risk foot care.  This patient requires this care by a professional since this patient will be at risk due to having type 2 diabetes. This patient is unable to cut nails herself since the patient cannot reach her nails.These nails are painful walking and wearing shoes.  This patient presents for at risk foot care today.  General Appearance  Alert, conversant and in no acute stress.  Vascular  Dorsalis pedis and posterior tibial  pulses are palpable  bilaterally.  Capillary return is within normal limits  bilaterally. Temperature is within normal limits  bilaterally.  Neurologic  Senn-Weinstein monofilament wire test within normal limits  bilaterally. Muscle power within normal limits bilaterally.  Nails Thick disfigured discolored nails with subungual debris  from hallux to fifth toes bilaterally. No evidence of bacterial infection or drainage bilaterally.  Orthopedic  No limitations of motion  feet .  No crepitus or effusions noted.  No bony pathology or digital deformities noted.  Skin  normotropic skin with no porokeratosis noted bilaterally.  No signs of infections or ulcers noted.     Onychomycosis  Pain in right toes  Pain in left toes  Consent was obtained for treatment procedures.   Mechanical debridement of nails 1-5  bilaterally performed with a nail nipper.  Filed with dremel without incident.    Return office visit    3 months                  Told patient to return for periodic foot care and evaluation due to potential at risk complications.   Delrae Hagey DPM   

## 2021-10-17 ENCOUNTER — Encounter: Payer: 59 | Attending: Family Medicine | Admitting: *Deleted

## 2021-10-17 ENCOUNTER — Encounter: Payer: Self-pay | Admitting: *Deleted

## 2021-10-17 VITALS — BP 110/70 | Ht 64.0 in | Wt 178.7 lb

## 2021-10-17 DIAGNOSIS — E119 Type 2 diabetes mellitus without complications: Secondary | ICD-10-CM | POA: Insufficient documentation

## 2021-10-17 DIAGNOSIS — Z713 Dietary counseling and surveillance: Secondary | ICD-10-CM | POA: Diagnosis not present

## 2021-10-17 NOTE — Patient Instructions (Signed)
Check blood sugars before breakfast or 2 hrs after supper every day Bring blood sugar records to the next appointment  Exercise:  Begin walking for    10  minutes   3  days a week and gradually increase to 30 minutes 5 x week  Eat 3 meals day,   1  snack a day Space meals 4-6 hours apart Don't skip meals - eat 1 protein and 1 carbohydrate serving Eat 1 serving of protein when eating fruit for a snack or meal Avoid sugar sweetened drinks (juices)  Return for appointment on:  Monday December 26, 2021 at 2:45 pm with Velna Hatchet (nurse)

## 2021-10-17 NOTE — Progress Notes (Signed)
Diabetes Self-Management Education  Visit Type: First/Initial  Appt. Start Time: 0930 Appt. End Time: 1030  10/17/2021  [name], identified by name and date of birth, is a 61 y.o. female with a diagnosis of Diabetes: Type 2.   ASSESSMENT  Blood pressure 110/70, height 5\' 4"  (1.626 m), weight 178 lb 11.2 oz (81.1 kg). Body mass index is 30.67 kg/m.   Diabetes Self-Management Education - 10/17/21 1122       Visit Information   Visit Type First/Initial      Initial Visit   Diabetes Type Type 2    Date Diagnosed 1 year ago    Are you currently following a meal plan? No    Are you taking your medications as prescribed? Yes      Health Coping   How would you rate your overall health? Excellent;Good      Psychosocial Assessment   Patient Belief/Attitude about Diabetes Other (comment)   "not good"   What is the hardest part about your diabetes right now, causing you the most concern, or is the most worrisome to you about your diabetes?   Making healty food and beverage choices    Self-care barriers None    Self-management support Doctor's office;Family    Patient Concerns Nutrition/Meal planning;Medication;Weight Control    Special Needs None    Preferred Learning Style Auditory;Visual;Hands on    Learning Readiness Ready    How often do you need to have someone help you when you read instructions, pamphlets, or other written materials from your doctor or pharmacy? 1 - Never    What is the last grade level you completed in school? 12      Pre-Education Assessment   Patient understands the diabetes disease and treatment process. Needs Instruction    Patient understands incorporating nutritional management into lifestyle. Needs Instruction    Patient undertands incorporating physical activity into lifestyle. Needs Instruction    Patient understands using medications safely. Needs Instruction    Patient understands monitoring blood glucose, interpreting and using results  Needs Review    Patient understands prevention, detection, and treatment of acute complications. Needs Instruction    Patient understands prevention, detection, and treatment of chronic complications. Needs Instruction    Patient understands how to develop strategies to address psychosocial issues. Needs Instruction    Patient understands how to develop strategies to promote health/change behavior. Needs Instruction      Complications   Last HgB A1C per patient/outside source 8.3 %   08/092023   How often do you check your blood sugar? 1-2 times/day    Fasting Blood glucose range (mg/dL) 10/918   She reports fasting blood sugars 110-130's mg/dL with reading of 40-981;191-478 mg/dLtoday.   Postprandial Blood glucose range (mg/dL) 295   She reports pp's now less than 140's mg/dL.   Have you had a dilated eye exam in the past 12 months? Yes    Have you had a dental exam in the past 12 months? No    Are you checking your feet? No      Dietary Intake   Breakfast bologna and cheese sandwich; cereal and milk    Snack (morning) reports no snacks    Lunch skips or eats fruit (plums, peaches, pineapple)    Dinner ribs, chicken, fish, pork, beef; chef salad; beans, green beans, greens, broccoli, cauliflower, brussel sprouts, asparagus, squash    Beverage(s) water, juice, diet soda      Activity / Exercise   Activity /  Exercise Type ADL's      Patient Education   Previous Diabetes Education Yes (please comment)   last year in our clinic   Disease Pathophysiology Explored patient's options for treatment of their diabetes    Healthy Eating Role of diet in the treatment of diabetes and the relationship between the three main macronutrients and blood glucose level;Food label reading, portion sizes and measuring food.;Reviewed blood glucose goals for pre and post meals and how to evaluate the patients' food intake on their blood glucose level.;Meal timing in regards to the patients' current  diabetes medication.    Being Active Role of exercise on diabetes management, blood pressure control and cardiac health.    Medications Reviewed patients medication for diabetes, action, purpose, timing of dose and side effects.    Monitoring Purpose and frequency of SMBG.;Taught/discussed recording of test results and interpretation of SMBG.;Identified appropriate SMBG and/or A1C goals.    Chronic complications Relationship between chronic complications and blood glucose control    Diabetes Stress and Support Identified and addressed patients feelings and concerns about diabetes;Role of stress on diabetes      Individualized Goals (developed by patient)   Reducing Risk Other (comment)   improve blood sugars, decrease medications, lose weight     Outcomes   Expected Outcomes Demonstrated interest in learning. Expect positive outcomes    Future DMSE 3-4 months             Individualized Plan for Diabetes Self-Management Training:   Learning Objective:  Patient will have a greater understanding of diabetes self-management. Patient education plan is to attend individual and/or group sessions per assessed needs and concerns.   Plan:   Patient Instructions  Check blood sugars before breakfast or 2 hrs after supper every day Bring blood sugar records to the next appointment  Exercise:  Begin walking for    10  minutes   3  days a week and gradually increase to 30 minutes 5 x week  Eat 3 meals day,   1  snack a day Space meals 4-6 hours apart Don't skip meals - eat 1 protein and 1 carbohydrate serving Eat 1 serving of protein when eating fruit for a snack or meal Avoid sugar sweetened drinks (juices)  Return for appointment on:  Monday December 26, 2021 at 2:45 pm with Velna Hatchet (nurse)   Expected Outcomes:  Demonstrated interest in learning. Expect positive outcomes  Education material provided:  General Meal Planning Guidelines Simple Meal Plan  If problems or questions, patient  to contact team via:   Sharion Settler, RN, CCM, CDCES [phone]  Future DSME appointment: 3-4 months December 26, 2021 with this educator

## 2021-10-25 ENCOUNTER — Telehealth: Payer: Self-pay | Admitting: Family Medicine

## 2021-10-25 NOTE — Telephone Encounter (Signed)
Copied from CRM [id number]. Topic: General - Other >> Oct 25, 2021 12:43 PM Clide Dales wrote: Patient is requesting Sheliah Mends ive her a call back regarding her CT appointment.

## 2021-10-25 NOTE — Telephone Encounter (Signed)
Called pt no answer went straight to vm, left vm to call back office

## 2021-11-14 ENCOUNTER — Other Ambulatory Visit: Payer: Self-pay | Admitting: Family Medicine

## 2021-11-14 DIAGNOSIS — E782 Mixed hyperlipidemia: Secondary | ICD-10-CM

## 2021-11-15 NOTE — Telephone Encounter (Signed)
Requested Prescriptions  Pending Prescriptions Disp Refills  . atorvastatin (LIPITOR) 20 MG tablet [Pharmacy Med Name: Atorvastatin Calcium 20 MG Oral Tablet] 30 tablet 0    Sig: TAKE 1 TABLET BY MOUTH AT BEDTIME     Cardiovascular:  Antilipid - Statins Failed - 11/14/2021  2:13 PM      Failed - Lipid Panel in normal range within the last 12 months    Cholesterol  Date Value Ref Range Status  06/23/2021 151 <200 mg/dL Final   LDL Cholesterol (Calc)  Date Value Ref Range Status  06/23/2021 69 mg/dL (calc) Final    Comment:    Reference range: <100 . Desirable range <100 mg/dL for primary prevention;   <70 mg/dL for patients with CHD or diabetic patients  with > or = 2 CHD risk factors. Marland Kitchen LDL-C is now calculated using the Martin-Hopkins  calculation, which is a validated novel method providing  better accuracy than the Friedewald equation in the  estimation of LDL-C.  [name]. 5102;585(27): 2061-2068  (http://education.[facility].com/faq/FAQ164)    HDL  Date Value Ref Range Status  06/23/2021 47 (L) > OR = 50 mg/dL Final   Triglycerides  Date Value Ref Range Status  06/23/2021 263 (H) <150 mg/dL Final    Comment:    . If a non-fasting specimen was collected, consider repeat triglyceride testing on a fasting specimen if clinically indicated.  [name] 7824;2:353-614. Marland Kitchen          Passed - Patient is not pregnant      Passed - Valid encounter within last 12 months    Recent Outpatient Visits          1 month ago Type 2 diabetes mellitus without complication, without long-term current use of insulin [facility])   [facility] Delsa Grana, PA-C   1 month ago [facility] Serafina Royals F, FNP   4 months ago Type 2 diabetes mellitus without complication, without long-term current use of insulin [facility])   [facility] Delsa Grana, PA-C   5 months ago Persistent  cough   [place], FNP   5 months ago Acute bronchitis, unspecified organism   [facility] Delsa Grana, PA-C      Future Appointments            In 1 month Delsa Grana, PA-C [facility], [place]

## 2021-12-01 ENCOUNTER — Ambulatory Visit (INDEPENDENT_AMBULATORY_CARE_PROVIDER_SITE_OTHER): Payer: Self-pay | Admitting: Family Medicine

## 2021-12-01 DIAGNOSIS — E1165 Type 2 diabetes mellitus with hyperglycemia: Secondary | ICD-10-CM

## 2021-12-01 DIAGNOSIS — R197 Diarrhea, unspecified: Secondary | ICD-10-CM

## 2021-12-01 NOTE — Progress Notes (Signed)
Name: Brenda Ellison   MRN: [id number]    DOB: 05-24-1960   Date:12/01/2021       Progress Note  Subjective:    Chief Complaint  Chief Complaint  Patient presents with   Diarrhea   Abdominal Pain    Pt believes related to taking Metformin, loss of appetite as well. Pt would like medication change. No vital signs provided    I connected with  Brenda Ellison on 12/01/21 at  9:20 AM EDT by telephone and verified that I am speaking with the correct person using two identifiers.   I discussed the limitations, risks, security and privacy concerns of performing an evaluation and management service by telephone and the availability of in person appointments. Staff also discussed with the patient that there may be a patient responsible charge related to this service.  Patient verbalized understanding and agreed to proceed with encounter. Patient Location: home Provider Location: [facility] clinic office Additional Individuals present: none  Diarrhea  Associated symptoms include abdominal pain.  Abdominal Pain Associated symptoms include diarrhea.    Pt had increased A1C with last OV, her metformin dose was increased and she was instructed previously to slowly increase med dose waiting 1 week + between doses Last OV August she was up to 1000 mg and tolerating it fine Since then she's tried 1000 mg BID and since then reports watery diarrhea, no vomiting  She is due to follow up in about 3 weeks  Lab Results  Component Value Date   HGBA1C 8.3 (H) 09/28/2021   Today she is going to have coffee and a donut for breakfast She was referred to DM education/nutrition consult but it did not get scheduled or done for some reason.    Patient Active Problem List   Diagnosis Date Noted   Type 2 diabetes mellitus without complication, without long-term current use of insulin ([place]) 06/30/2021   Sarcoidosis of lung ([place]) 06/06/2021   Pelvic pain 11/29/2020   Gastroesophageal reflux disease 03/18/2020    New onset type 2 diabetes mellitus ([place]) 02/19/2020   Allergic rhinitis 02/17/2020   Thoracic aortic aneurysm without rupture ([place]) 09/23/2019   Small airways disease 05/13/2019   Class 1 obesity with serious comorbidity and body mass index (BMI) of 31.0 to 31.9 in adult 05/13/2019   Family history of early CAD 05/13/2019   Lung nodule 04/01/2019   Wheeze 04/01/2019   Exertional shortness of breath 04/01/2019   Mediastinal lymphadenopathy 04/01/2019   Hilar lymphadenopathy 04/01/2019   Cervical radiculopathy 10/25/2018   Stable angina pectoris 09/30/2018   Overweight (BMI 25.0-29.9) 04/23/2018   DDD (degenerative disc disease), lumbar 11/06/2017   Cardiac murmur 03/16/2017   Lumbar radiculopathy 03/16/2017   Osteoarthritis 06/07/2015   Hyperlipidemia 06/01/2015   Seasonal allergies 01/12/2015   Urticaria 11/30/2014   Degenerative arthritis of hip 10/19/2014   Arthritis of hand, degenerative 10/05/2014   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   Moderate mitral insufficiency 05/01/2014    Social History   Tobacco Use   Smoking status: Former    Packs/day: 1.00    Years: 22.00    Total pack years: 22.00    Types: Cigarettes    Start date: 02/21/1988    Quit date: 02/20/2010    Years since quitting: 11.7   Smokeless tobacco: Never  Substance Use Topics   Alcohol use: No     Current Outpatient Medications:    albuterol (VENTOLIN HFA) 108 (90 Base) MCG/ACT inhaler, Inhale 2 puffs into the lungs every  4 (four) hours as needed for wheezing or shortness of breath., Disp: 18 g, Rfl: 3   atorvastatin (LIPITOR) 20 MG tablet, TAKE 1 TABLET BY MOUTH AT BEDTIME, Disp: 30 tablet, Rfl: 0   benzonatate (TESSALON) 100 MG capsule, Take 1-2 capsules (100-200 mg total) by mouth 3 (three) times daily as needed for cough., Disp: 30 capsule, Rfl: 1   blood glucose meter kit and supplies, Dispense based on patient and insurance preference. Use up to four times daily as directed. (FOR ICD-10 E10.9, E11.9).,  Disp: 1 each, Rfl: 0   budesonide-formoterol (SYMBICORT) 160-4.5 MCG/ACT inhaler, Inhale 2 puffs into the lungs 2 (two) times daily., Disp: 1 each, Rfl: 0   celecoxib (CELEBREX) 100 MG capsule, Take 100 mg by mouth 2 (two) times daily as needed., Disp: , Rfl:    Cholecalciferol (VITAMIN D3) 2000 UNITS capsule, Take 2,000 Units by mouth daily., Disp: , Rfl:    fluticasone (FLONASE) 50 MCG/ACT nasal spray, Place 2 sprays into both nostrils daily., Disp: 16 g, Rfl: 6   gabapentin (NEURONTIN) 100 MG capsule, Take 100 mg by mouth 2 (two) times daily., Disp: , Rfl:    levocetirizine (XYZAL) 5 MG tablet, Take 1 tablet (5 mg total) by mouth every evening. (Patient taking differently: Take 5 mg by mouth daily as needed.), Disp: 90 tablet, Rfl: 1   meclizine (ANTIVERT) 12.5 MG tablet, Take 1 tablet (12.5 mg total) by mouth 3 (three) times daily as needed for dizziness., Disp: 30 tablet, Rfl: 0   metFORMIN (GLUCOPHAGE-XR) 500 MG 24 hr tablet, Take 2 tablets (1,000 mg total) by mouth 2 (two) times daily with a meal., Disp: 360 tablet, Rfl: 1   montelukast (SINGULAIR) 10 MG tablet, Take 1 tablet (10 mg total) by mouth at bedtime., Disp: 30 tablet, Rfl: 3   pantoprazole (PROTONIX) 20 MG tablet, Take 20 mg by mouth daily., Disp: , Rfl:    promethazine-dextromethorphan (PROMETHAZINE-DM) 6.25-15 MG/5ML syrup, Take 2.5-5 mLs by mouth 3 (three) times daily as needed for cough., Disp: 118 mL, Rfl: 1   tiZANidine (ZANAFLEX) 4 MG tablet, Take 1 tablet (4 mg total) by mouth every 8 (eight) hours as needed for muscle spasms (muscle tightness)., Disp: 90 tablet, Rfl: 2  Allergies  Allergen Reactions   Tramadol Rash    Chart Review: I personally reviewed active problem list, medication list, allergies, family history, social history, health maintenance, notes from last encounter, lab results, imaging with the patient/caregiver today.   Review of Systems  Constitutional: Negative.   HENT: Negative.    Eyes: Negative.    Respiratory: Negative.    Cardiovascular: Negative.   Gastrointestinal:  Positive for abdominal pain and diarrhea.  Endocrine: Negative.   Genitourinary: Negative.   Musculoskeletal: Negative.   Skin: Negative.   Allergic/Immunologic: Negative.   Neurological: Negative.   Hematological: Negative.   Psychiatric/Behavioral: Negative.    All other systems reviewed and are negative.    Objective:    Virtual encounter, vitals limited, only able to obtain the following There were no vitals filed for this visit. There is no height or weight on file to calculate BMI. Nursing Note and Vital Signs reviewed.  Physical Exam Vitals and nursing note reviewed.  Pulmonary:     Comments: Speaking in full and complete sentences, no audible wheeze or stridor Neurological:     Mental Status: She is alert.     PE limited by telephone encounter  No results found for this or any previous visit (from the  past 72 hour(s)).  Assessment and Plan:     ICD-10-CM   1. Uncontrolled diabetes mellitus with hyperglycemia, without long-term current use of insulin (HCC)  E11.65 Referral to Nutrition and Diabetes Services   Pt tolerated 1000 mg metformin fine, but A1C still uncontrolled, dose 1000 mg BID has associated watery diarrhea, go back to 500 mg BID with food    2. Diarrhea, unspecified type  R19.7    metformin dose increase may be causing - see plan above    Pt encouraged to work on diet and avoid sweets and foods like donuts often, she asked a lot of nutrition questions - reviewed with her that we referred her to DM education and nutrition 2x and that would be helpful if she will answer their calls and set an appointment.   She has a f/up appt in about 3 weeks  - I discussed the assessment and treatment plan with the patient. The patient was provided an opportunity to ask questions and all were answered. The patient agreed with the plan and demonstrated an understanding of the  instructions.  - The patient was advised to call back or seek an in-person evaluation if the symptoms worsen or if the condition fails to improve as anticipated.  I provided 20 minutes of non-face-to-face time during this encounter.  Delsa Grana, PA-C 12/01/21 9:51 AM

## 2021-12-12 ENCOUNTER — Encounter: Payer: Self-pay | Attending: Family Medicine | Admitting: *Deleted

## 2021-12-12 ENCOUNTER — Encounter: Payer: Self-pay | Admitting: *Deleted

## 2021-12-12 ENCOUNTER — Other Ambulatory Visit: Payer: Self-pay

## 2021-12-12 DIAGNOSIS — Z5189 Encounter for other specified aftercare: Secondary | ICD-10-CM | POA: Insufficient documentation

## 2021-12-12 DIAGNOSIS — D869 Sarcoidosis, unspecified: Secondary | ICD-10-CM | POA: Insufficient documentation

## 2021-12-12 NOTE — Progress Notes (Signed)
Virtual Visit completed. Patient informed on EP and RD appointment and 6 Minute walk test. Patient also informed of patient health questionnaires on My Chart. Patient Verbalizes understanding. Visit diagnosis can be found in [facility] 11/09/2021.  

## 2021-12-15 VITALS — Ht 64.0 in | Wt 174.3 lb

## 2021-12-15 DIAGNOSIS — D869 Sarcoidosis, unspecified: Secondary | ICD-10-CM

## 2021-12-15 NOTE — Progress Notes (Signed)
Pulmonary Individual Treatment Plan  Patient Details  Name: Brenda Ellison MRN: [id number] Date of Birth: 04-Jul-1960 Referring Provider:   Flowsheet Row Pulmonary Rehab from 12/15/2021 in [facility] Cardiac and Pulmonary Rehab  Referring Provider Thad Ranger, MD       Initial Encounter Date:  Flowsheet Row Pulmonary Rehab from 12/15/2021 in [facility] Cardiac and Pulmonary Rehab  Date 12/15/21       Visit Diagnosis: Sarcoidosis  Patient's Home Medications on Admission:  Current Outpatient Medications:    albuterol (VENTOLIN HFA) 108 (90 Base) MCG/ACT inhaler, Inhale 2 puffs into the lungs every 4 (four) hours as needed for wheezing or shortness of breath., Disp: 18 g, Rfl: 3   albuterol (VENTOLIN HFA) 108 (90 Base) MCG/ACT inhaler, 1 puff as needed Inhalation every 4 hrs (Patient not taking: Reported on 12/12/2021), Disp: , Rfl:    atorvastatin (LIPITOR) 20 MG tablet, TAKE 1 TABLET BY MOUTH AT BEDTIME, Disp: 30 tablet, Rfl: 0   benzonatate (TESSALON) 100 MG capsule, Take 1-2 capsules (100-200 mg total) by mouth 3 (three) times daily as needed for cough., Disp: 30 capsule, Rfl: 1   blood glucose meter kit and supplies, Dispense based on patient and insurance preference. Use up to four times daily as directed. (FOR ICD-10 E10.9, E11.9)., Disp: 1 each, Rfl: 0   budesonide-formoterol (SYMBICORT) 160-4.5 MCG/ACT inhaler, Inhale 2 puffs into the lungs 2 (two) times daily., Disp: 1 each, Rfl: 0   celecoxib (CELEBREX) 100 MG capsule, Take 100 mg by mouth 2 (two) times daily as needed. (Patient not taking: Reported on 12/12/2021), Disp: , Rfl:    celecoxib (CELEBREX) 100 MG capsule, Take 1 capsule by mouth 2 (two) times daily., Disp: , Rfl:    cetirizine (ZYRTEC ALLERGY) 10 MG tablet, 1 tablet Orally Once a day for 30 day(s), Disp: , Rfl:    Cholecalciferol (VITAMIN D3) 2000 UNITS capsule, Take 2,000 Units by mouth daily., Disp: , Rfl:    fluticasone (FLONASE) 50 MCG/ACT nasal spray, Place 2 sprays  into both nostrils daily., Disp: 16 g, Rfl: 6   gabapentin (NEURONTIN) 100 MG capsule, Take 100 mg by mouth 2 (two) times daily., Disp: , Rfl:    levocetirizine (XYZAL) 5 MG tablet, Take 1 tablet (5 mg total) by mouth every evening., Disp: 90 tablet, Rfl: 1   meclizine (ANTIVERT) 12.5 MG tablet, Take 1 tablet (12.5 mg total) by mouth 3 (three) times daily as needed for dizziness., Disp: 30 tablet, Rfl: 0   metFORMIN (GLUCOPHAGE-XR) 500 MG 24 hr tablet, Take 2 tablets (1,000 mg total) by mouth 2 (two) times daily with a meal., Disp: 360 tablet, Rfl: 1   montelukast (SINGULAIR) 10 MG tablet, Take 1 tablet (10 mg total) by mouth at bedtime., Disp: 30 tablet, Rfl: 3   pantoprazole (PROTONIX) 20 MG tablet, Take 20 mg by mouth daily., Disp: , Rfl:    promethazine-dextromethorphan (PROMETHAZINE-DM) 6.25-15 MG/5ML syrup, Take 2.5-5 mLs by mouth 3 (three) times daily as needed for cough. (Patient not taking: Reported on 12/12/2021), Disp: 118 mL, Rfl: 1   tiZANidine (ZANAFLEX) 4 MG tablet, Take 1 tablet (4 mg total) by mouth every 8 (eight) hours as needed for muscle spasms (muscle tightness)., Disp: 90 tablet, Rfl: 2  Past Medical History: Past Medical History:  Diagnosis Date   Arthritis pain    Bilateral leg pain    Chronic nasal congestion    Diabetes mellitus without complication (HCC)    Dizziness of unknown cause    Heart  murmur previously undiagnosed    Osteoarthritis, hand    Prediabetes 07/12/2016    Tobacco Use: Social History   Tobacco Use  Smoking Status Former   Packs/day: 1.00   Years: 22.00   Total pack years: 22.00   Types: Cigarettes   Start date: 02/21/1988   Quit date: 02/20/2010   Years since quitting: 11.8  Smokeless Tobacco Never    Labs: Review Flowsheet  More data exists      Latest Ref Rng & Units 02/17/2020 06/15/2020 09/07/2020 06/23/2021 09/28/2021  Labs for ITP Cardiac and Pulmonary Rehab  Cholestrol <200 mg/dL 176  - - 151  -  LDL (calc) mg/dL (calc) 95  - -  69  -  HDL-C > OR = 50 mg/dL 57  - - 47  -  Trlycerides <150 mg/dL 138  - - 263  -  Hemoglobin A1c <5.7 % of total Hgb 7.1  6.9  6.5  8.0  8.3      Pulmonary Assessment Scores:  Pulmonary Assessment Scores     Row Name 12/15/21 1423         ADL UCSD   SOB Score total 56     Rest 0     Walk 3     Stairs 5     Bath 3     Dress 3     Shop 4       CAT Score   CAT Score 15       mMRC Score   mMRC Score 1              UCSD: Self-administered rating of dyspnea associated with activities of daily living (ADLs) 6-point scale (0 = "not at all" to 5 = "maximal or unable to do because of breathlessness")  Scoring Scores range from 0 to 120.  Minimally important difference is 5 units  CAT: CAT can identify the health impairment of COPD patients and is better correlated with disease progression.  CAT has a scoring range of zero to 40. The CAT score is classified into four groups of low (less than 10), medium (10 - 20), high (21-30) and very high (31-40) based on the impact level of disease on health status. A CAT score over 10 suggests significant symptoms.  A worsening CAT score could be explained by an exacerbation, poor medication adherence, poor inhaler technique, or progression of COPD or comorbid conditions.  CAT MCID is 2 points  mMRC: mMRC (Modified Medical Research Council) Dyspnea Scale is used to assess the degree of baseline functional disability in patients of respiratory disease due to dyspnea. No minimal important difference is established. A decrease in score of 1 point or greater is considered a positive change.   Pulmonary Function Assessment:  Pulmonary Function Assessment - 12/12/21 1118       Breath   Shortness of Breath Yes;Limiting activity             Exercise Target Goals: Exercise Program Goal: Individual exercise prescription set using results from initial 6 min walk test and THRR while considering  patient's activity barriers and safety.    Exercise Prescription Goal: Initial exercise prescription builds to 30-45 minutes a day of aerobic activity, 2-3 days per week.  Home exercise guidelines will be given to patient during program as part of exercise prescription that the participant will acknowledge.  Education: Aerobic Exercise: - Group verbal and visual presentation on the components of exercise prescription. Introduces F.I.T.T principle from ACSM for exercise prescriptions.  Reviews F.I.T.T. principles of aerobic exercise including progression. Written material given at graduation. Flowsheet Row Pulmonary Rehab from 12/15/2021 in [facility] Cardiac and Pulmonary Rehab  Education need identified 12/15/21       Education: Resistance Exercise: - Group verbal and visual presentation on the components of exercise prescription. Introduces F.I.T.T principle from ACSM for exercise prescriptions  Reviews F.I.T.T. principles of resistance exercise including progression. Written material given at graduation.    Education: Exercise & Equipment Safety: - Individual verbal instruction and demonstration of equipment use and safety with use of the equipment. Flowsheet Row Pulmonary Rehab from 12/15/2021 in [facility] Cardiac and Pulmonary Rehab  Date 12/12/21  Educator [facility]  Instruction Review Code 1- Verbalizes Understanding       Education: Exercise Physiology & General Exercise Guidelines: - Group verbal and written instruction with models to review the exercise physiology of the cardiovascular system and associated critical values. Provides general exercise guidelines with specific guidelines to those with heart or lung disease.    Education: Flexibility, Balance, Mind/Body Relaxation: - Group verbal and visual presentation with interactive activity on the components of exercise prescription. Introduces F.I.T.T principle from ACSM for exercise prescriptions. Reviews F.I.T.T. principles of flexibility and balance exercise training including  progression. Also discusses the mind body connection.  Reviews various relaxation techniques to help reduce and manage stress (i.e. Deep breathing, progressive muscle relaxation, and visualization). Balance handout provided to take home. Written material given at graduation.   Activity Barriers & Risk Stratification:  Activity Barriers & Cardiac Risk Stratification - 12/15/21 1456       Activity Barriers & Cardiac Risk Stratification   Activity Barriers Deconditioning;Shortness of Breath             6 Minute Walk:  6 Minute Walk     Row Name 12/15/21 1448         6 Minute Walk   Distance 1080 feet     Walk Time 6 minutes     # of Rest Breaks 0     MPH 2.05     METS 2.9     RPE 11     Perceived Dyspnea  2     VO2 Peak 10.15     Symptoms Yes (comment)     Comments SOB, Fatigue     Resting HR 60 bpm     Resting BP 118/62     Resting Oxygen Saturation  95 %     Exercise Oxygen Saturation  during 6 min walk 92 %     Max Ex. HR 96 bpm     Max Ex. BP 132/84     2 Minute Post BP 120/70       Interval HR   1 Minute HR 83     2 Minute HR 91     3 Minute HR 91     4 Minute HR 93     5 Minute HR 96     6 Minute HR 95     2 Minute Post HR 67     Interval Heart Rate? Yes       Interval Oxygen   Interval Oxygen? Yes     Baseline Oxygen Saturation % 95 %     1 Minute Oxygen Saturation % 95 %     1 Minute Liters of Oxygen 0 L  RA     2 Minute Oxygen Saturation % 92 %     2 Minute Liters of Oxygen 0 L  RA  3 Minute Oxygen Saturation % 94 %     3 Minute Liters of Oxygen 0 L  RA     4 Minute Oxygen Saturation % 95 %     4 Minute Liters of Oxygen 0 L  RA     5 Minute Oxygen Saturation % 94 %     5 Minute Liters of Oxygen 0 L  RA     6 Minute Oxygen Saturation % 92 %     6 Minute Liters of Oxygen 0 L  RA     2 Minute Post Oxygen Saturation % 97 %     2 Minute Post Liters of Oxygen 0 L  RA             Oxygen Initial Assessment:  Oxygen Initial Assessment -  12/12/21 1117       Home Oxygen   Home Oxygen Device None    Sleep Oxygen Prescription None    Home Exercise Oxygen Prescription None    Home Resting Oxygen Prescription None      Initial 6 min Walk   Oxygen Used None      Program Oxygen Prescription   Program Oxygen Prescription None      Intervention   Short Term Goals To learn and understand importance of monitoring SPO2 with pulse oximeter and demonstrate accurate use of the pulse oximeter.;To learn and understand importance of maintaining oxygen saturations>88%;To learn and demonstrate proper pursed lip breathing techniques or other breathing techniques. ;To learn and demonstrate proper use of respiratory medications;To learn and exhibit compliance with exercise, home and travel O2 prescription    Long  Term Goals Verbalizes importance of monitoring SPO2 with pulse oximeter and return demonstration;Exhibits compliance with exercise, home  and travel O2 prescription;Maintenance of O2 saturations>88%;Exhibits proper breathing techniques, such as pursed lip breathing or other method taught during program session;Compliance with respiratory medication;Demonstrates proper use of MDI's             Oxygen Re-Evaluation:   Oxygen Discharge (Final Oxygen Re-Evaluation):   Initial Exercise Prescription:  Initial Exercise Prescription - 12/15/21 1400       Date of Initial Exercise RX and Referring Provider   Date 12/15/21    Referring Provider Thad Ranger, MD      Oxygen   Maintain Oxygen Saturation 88% or higher      Treadmill   MPH 1.8    Grade 0.5    Minutes 15    METs 2.5      Recumbant Bike   Level 1    RPM 50    Watts 22    Minutes 15    METs 2.9      NuStep   Level 2    SPM 80    Minutes 15    METs 2.9      T5 Nustep   Level 1    SPM 80    Minutes 15    METs 2.9      Prescription Details   Frequency (times per week) 2    Duration Progress to 30 minutes of continuous aerobic without  signs/symptoms of physical distress      Intensity   THRR 40-80% of Max Heartrate 100-140    Ratings of Perceived Exertion 11-13    Perceived Dyspnea 0-4      Progression   Progression Continue to progress workloads to maintain intensity without signs/symptoms of physical distress.      Resistance Training   Training Prescription Yes  Weight 3 lb    Reps 10-15             Perform Capillary Blood Glucose checks as needed.  Exercise Prescription Changes:   Exercise Prescription Changes     Row Name 12/15/21 1400             Response to Exercise   Blood Pressure (Admit) 118/62       Blood Pressure (Exercise) 132/84       Blood Pressure (Exit) 120/70       Heart Rate (Admit) 60 bpm       Heart Rate (Exercise) 96 bpm       Heart Rate (Exit) 67 bpm       Oxygen Saturation (Admit) 95 %       Oxygen Saturation (Exercise) 92 %       Oxygen Saturation (Exit) 97 %       Rating of Perceived Exertion (Exercise) 11       Perceived Dyspnea (Exercise) 2       Symptoms SOB, Fatigue       Comments 6MWT Results                Exercise Comments:   Exercise Goals and Review:   Exercise Goals     Row Name 12/15/21 1501             Exercise Goals   Increase Physical Activity Yes       Intervention Provide advice, education, support and counseling about physical activity/exercise needs.;Develop an individualized exercise prescription for aerobic and resistive training based on initial evaluation findings, risk stratification, comorbidities and participant's personal goals.       Expected Outcomes Short Term: Attend rehab on a regular basis to increase amount of physical activity.;Long Term: Add in home exercise to make exercise part of routine and to increase amount of physical activity.;Long Term: Exercising regularly at least 3-5 days a week.       Increase Strength and Stamina Yes       Intervention Provide advice, education, support and counseling about physical  activity/exercise needs.;Develop an individualized exercise prescription for aerobic and resistive training based on initial evaluation findings, risk stratification, comorbidities and participant's personal goals.       Expected Outcomes Short Term: Increase workloads from initial exercise prescription for resistance, speed, and METs.;Long Term: Improve cardiorespiratory fitness, muscular endurance and strength as measured by increased METs and functional capacity (6MWT);Short Term: Perform resistance training exercises routinely during rehab and add in resistance training at home       Able to understand and use rate of perceived exertion (RPE) scale Yes       Intervention Provide education and explanation on how to use RPE scale       Expected Outcomes Short Term: Able to use RPE daily in rehab to express subjective intensity level;Long Term:  Able to use RPE to guide intensity level when exercising independently       Able to understand and use Dyspnea scale Yes       Intervention Provide education and explanation on how to use Dyspnea scale       Expected Outcomes Long Term: Able to use Dyspnea scale to guide intensity level when exercising independently;Short Term: Able to use Dyspnea scale daily in rehab to express subjective sense of shortness of breath during exertion       Knowledge and understanding of Target Heart Rate Range (THRR) Yes  Intervention Provide education and explanation of THRR including how the numbers were predicted and where they are located for reference       Expected Outcomes Short Term: Able to state/look up THRR;Long Term: Able to use THRR to govern intensity when exercising independently;Short Term: Able to use daily as guideline for intensity in rehab       Able to check pulse independently Yes       Intervention Provide education and demonstration on how to check pulse in carotid and radial arteries.;Review the importance of being able to check your own pulse for  safety during independent exercise       Expected Outcomes Short Term: Able to explain why pulse checking is important during independent exercise;Long Term: Able to check pulse independently and accurately       Understanding of Exercise Prescription Yes       Intervention Provide education, explanation, and written materials on patient's individual exercise prescription       Expected Outcomes Short Term: Able to explain program exercise prescription;Long Term: Able to explain home exercise prescription to exercise independently                Exercise Goals Re-Evaluation :   Discharge Exercise Prescription (Final Exercise Prescription Changes):  Exercise Prescription Changes - 12/15/21 1400       Response to Exercise   Blood Pressure (Admit) 118/62    Blood Pressure (Exercise) 132/84    Blood Pressure (Exit) 120/70    Heart Rate (Admit) 60 bpm    Heart Rate (Exercise) 96 bpm    Heart Rate (Exit) 67 bpm    Oxygen Saturation (Admit) 95 %    Oxygen Saturation (Exercise) 92 %    Oxygen Saturation (Exit) 97 %    Rating of Perceived Exertion (Exercise) 11    Perceived Dyspnea (Exercise) 2    Symptoms SOB, Fatigue    Comments 6MWT Results             Nutrition:  Target Goals: Understanding of nutrition guidelines, daily intake of sodium <15101m, cholesterol <2062m calories 30% from fat and 7% or less from saturated fats, daily to have 5 or more servings of fruits and vegetables.  Education: All About Nutrition: -Group instruction provided by verbal, written material, interactive activities, discussions, models, and posters to present general guidelines for heart healthy nutrition including fat, fiber, MyPlate, the role of sodium in heart healthy nutrition, utilization of the nutrition label, and utilization of this knowledge for meal planning. Follow up email sent as well. Written material given at graduation.   Biometrics:  Pre Biometrics - 12/15/21 1501       Pre  Biometrics   Height 5' 4" (1.626 m)    Weight 174 lb 4.8 oz (79.1 kg)    Waist Circumference 44.5 inches    Hip Circumference 42.5 inches    Waist to Hip Ratio 1.05 %    BMI (Calculated) 29.9    Single Leg Stand 11.9 seconds   R             Nutrition Therapy Plan and Nutrition Goals:  Nutrition Therapy & Goals - 12/15/21 1158       Nutrition Therapy   Diet Heart healthy, low Na, T2DM MNT    Drug/Food Interactions Statins/Certain Fruits    Protein (specify units) 90-100g    Fiber 25 grams    Whole Grain Foods 3 servings    Saturated Fats 14 max. grams    Fruits and  Vegetables 8 servings/day    Sodium 2 grams      Personal Nutrition Goals   Nutrition Goal ST: read over materials, practice MyPlate guidelines, include balanced snacks during the day LT: Achieve and maintain A1C <7, eat consistent meals/snacks during the day (about 1-2 servings of CHO per snack and 3-5 servings of CHO per meals), include protein, fiber, and healthy fat at most meals/snacks    Comments 61 y.o. F admitted to pulmonary rehab for sarcoidosis. PMHx includes T2DM, GERD, DDD, HLD, osteoarthritis, former smoker. Relevant medications includes lipitor, vit D3, meclizine, metformin, pantoprazole, tizanidine. Last A1C 09/28/21 was 8.3. Verdelle had previously seen some regarding diabetes in nutrition this past summer, but has not been back. Discussed general heart healthy eating and T2DM MNT include pairing foods, quality and quantity of CHOs, eating consistently, hydration, and exercise. Geri feels that she does not eat very consistently right now - discussed some snacks that she could include like an apple with peanut butter.      Intervention Plan   Intervention Prescribe, educate and counsel regarding individualized specific dietary modifications aiming towards targeted core components such as weight, hypertension, lipid management, diabetes, heart failure and other comorbidities.;Nutrition handout(s) given to  patient.    Expected Outcomes Short Term Goal: Understand basic principles of dietary content, such as calories, fat, sodium, cholesterol and nutrients.;Short Term Goal: A plan has been developed with personal nutrition goals set during dietitian appointment.;Long Term Goal: Adherence to prescribed nutrition plan.             Nutrition Assessments:  MEDIFICTS Score Key: ?70 Need to make dietary changes  40-70 Heart Healthy Diet ? 40 Therapeutic Level Cholesterol Diet  Flowsheet Row Pulmonary Rehab from 12/15/2021 in [facility] Cardiac and Pulmonary Rehab  Picture Your Plate Total Score on Admission 71      Picture Your Plate Scores: <06 Unhealthy dietary pattern with much room for improvement. 41-50 Dietary pattern unlikely to meet recommendations for good health and room for improvement. 51-60 More healthful dietary pattern, with some room for improvement.  >60 Healthy dietary pattern, although there may be some specific behaviors that could be improved.   Nutrition Goals Re-Evaluation:   Nutrition Goals Discharge (Final Nutrition Goals Re-Evaluation):   Psychosocial: Target Goals: Acknowledge presence or absence of significant depression and/or stress, maximize coping skills, provide positive support system. Participant is able to verbalize types and ability to use techniques and skills needed for reducing stress and depression.   Education: Stress, Anxiety, and Depression - Group verbal and visual presentation to define topics covered.  Reviews how body is impacted by stress, anxiety, and depression.  Also discusses healthy ways to reduce stress and to treat/manage anxiety and depression.  Written material given at graduation.   Education: Sleep Hygiene -Provides group verbal and written instruction about how sleep can affect your health.  Define sleep hygiene, discuss sleep cycles and impact of sleep habits. Review good sleep hygiene tips.    Initial Review & Psychosocial  Screening:  Initial Psych Review & Screening - 12/12/21 1119       Initial Review   Current issues with None Identified      Family Dynamics   Good Support System? Yes    Comments Estephania can look to her sister inlaw and sons for support. She has church family and pastor for support as well. The normal stresses of daily life are her only stressors.      Barriers   Psychosocial barriers to participate in  program The patient should benefit from training in stress management and relaxation.      Screening Interventions   Interventions Encouraged to exercise;To provide support and resources with identified psychosocial needs;Provide feedback about the scores to participant    Expected Outcomes Short Term goal: Utilizing psychosocial counselor, staff and physician to assist with identification of specific Stressors or current issues interfering with healing process. Setting desired goal for each stressor or current issue identified.;Long Term Goal: Stressors or current issues are controlled or eliminated.;Short Term goal: Identification and review with participant of any Quality of Life or Depression concerns found by scoring the questionnaire.;Long Term goal: The participant improves quality of Life and PHQ9 Scores as seen by post scores and/or verbalization of changes             Quality of Life Scores:  Scores of 19 and below usually indicate a poorer quality of life in these areas.  A difference of  2-3 points is a clinically meaningful difference.  A difference of 2-3 points in the total score of the Quality of Life Index has been associated with significant improvement in overall quality of life, self-image, physical symptoms, and general health in studies assessing change in quality of life.  PHQ-9: Review Flowsheet  More data exists      12/15/2021 12/01/2021 10/17/2021 09/28/2021 09/19/2021  Depression screen PHQ 2/9  Decreased Interest 3 0 0 0 0  Down, Depressed, Hopeless 0 0 0 0 0   PHQ - 2 Score 3 0 0 0 0  Altered sleeping 3 0 - 0 -  Tired, decreased energy 3 0 - 0 -  Change in appetite 3 0 - 0 -  Feeling bad or failure about yourself  0 0 - 0 -  Trouble concentrating 0 0 - 0 -  Moving slowly or fidgety/restless 3 0 - 0 -  Suicidal thoughts 0 0 - 0 -  PHQ-9 Score 15 0 - 0 -  Difficult doing work/chores Not difficult at all Not difficult at all - Not difficult at all -   Interpretation of Total Score  Total Score Depression Severity:  1-4 = Minimal depression, 5-9 = Mild depression, 10-14 = Moderate depression, 15-19 = Moderately severe depression, 20-27 = Severe depression   Psychosocial Evaluation and Intervention:  Psychosocial Evaluation - 12/12/21 1122       Psychosocial Evaluation & Interventions   Interventions Encouraged to exercise with the program and follow exercise prescription;Relaxation education;Stress management education    Comments Ximenna can look to her sister inlaw and sons for support. She has church family and pastor for support as well. The normal stresses of daily life are her only stressors.    Expected Outcomes Short: Start [facility] to help with mood. Long: Maintain a healthy mental state.    Continue Psychosocial Services  Follow up required by staff             Psychosocial Re-Evaluation:   Psychosocial Discharge (Final Psychosocial Re-Evaluation):   Education: Education Goals: Education classes will be provided on a weekly basis, covering required topics. Participant will state understanding/return demonstration of topics presented.  Learning Barriers/Preferences:  Learning Barriers/Preferences - 12/12/21 1118       Learning Barriers/Preferences   Learning Barriers None    Learning Preferences None             General Pulmonary Education Topics:  Infection Prevention: - Provides verbal and written material to individual with discussion of infection control including proper hand washing  and proper equipment  cleaning during exercise session. Flowsheet Row Pulmonary Rehab from 12/15/2021 in [facility] Cardiac and Pulmonary Rehab  Date 12/12/21  Educator [facility]  Instruction Review Code 1- Verbalizes Understanding       Falls Prevention: - Provides verbal and written material to individual with discussion of falls prevention and safety. Flowsheet Row Pulmonary Rehab from 12/15/2021 in [facility] Cardiac and Pulmonary Rehab  Date 12/12/21  Educator [facility]  Instruction Review Code 1- Verbalizes Understanding       Chronic Lung Disease Review: - Group verbal instruction with posters, models, PowerPoint presentations and videos,  to review new updates, new respiratory medications, new advancements in procedures and treatments. Providing information on websites and "800" numbers for continued self-education. Includes information about supplement oxygen, available portable oxygen systems, continuous and intermittent flow rates, oxygen safety, concentrators, and Medicare reimbursement for oxygen. Explanation of Pulmonary Drugs, including class, frequency, complications, importance of spacers, rinsing mouth after steroid MDI's, and proper cleaning methods for nebulizers. Review of basic lung anatomy and physiology related to function, structure, and complications of lung disease. Review of risk factors. Discussion about methods for diagnosing sleep apnea and types of masks and machines for OSA. Includes a review of the use of types of environmental controls: home humidity, furnaces, filters, dust mite/pet prevention, HEPA vacuums. Discussion about weather changes, air quality and the benefits of nasal washing. Instruction on Warning signs, infection symptoms, calling MD promptly, preventive modes, and value of vaccinations. Review of effective airway clearance, coughing and/or vibration techniques. Emphasizing that all should Create an Action Plan. Written material given at graduation. Flowsheet Row Pulmonary Rehab from  12/15/2021 in [facility] Cardiac and Pulmonary Rehab  Education need identified 12/15/21       AED/CPR: - Group verbal and written instruction with the use of models to demonstrate the basic use of the AED with the basic ABC's of resuscitation.    Anatomy and Cardiac Procedures: - Group verbal and visual presentation and models provide information about basic cardiac anatomy and function. Reviews the testing methods done to diagnose heart disease and the outcomes of the test results. Describes the treatment choices: Medical Management, Angioplasty, or Coronary Bypass Surgery for treating various heart conditions including Myocardial Infarction, Angina, Valve Disease, and Cardiac Arrhythmias.  Written material given at graduation.   Medication Safety: - Group verbal and visual instruction to review commonly prescribed medications for heart and lung disease. Reviews the medication, class of the drug, and side effects. Includes the steps to properly store meds and maintain the prescription regimen.  Written material given at graduation.   Other: -Provides group and verbal instruction on various topics (see comments)   Knowledge Questionnaire Score:  Knowledge Questionnaire Score - 12/15/21 1422       Knowledge Questionnaire Score   Pre Score 14/18              Core Components/Risk Factors/Patient Goals at Admission:  Personal Goals and Risk Factors at Admission - 12/15/21 1427       Core Components/Risk Factors/Patient Goals on Admission    Weight Management Yes;Weight Loss    Intervention Weight Management: Develop a combined nutrition and exercise program designed to reach desired caloric intake, while maintaining appropriate intake of nutrient and fiber, sodium and fats, and appropriate energy expenditure required for the weight goal.;Weight Management: Provide education and appropriate resources to help participant work on and attain dietary goals.;Weight Management/Obesity:  Establish reasonable short term and long term weight goals.    Admit Weight  174 lb 4.8 oz (79.1 kg)    Goal Weight: Short Term 170 lb (77.1 kg)    Goal Weight: Long Term 164 lb (74.4 kg)    Expected Outcomes Short Term: Continue to assess and modify interventions until short term weight is achieved;Long Term: Adherence to nutrition and physical activity/exercise program aimed toward attainment of established weight goal;Weight Loss: Understanding of general recommendations for a balanced deficit meal plan, which promotes 1-2 lb weight loss per week and includes a negative energy balance of [phone] kcal/d;Understanding recommendations for meals to include 15-35% energy as protein, 25-35% energy from fat, 35-60% energy from carbohydrates, less than $RemoveB'200mg'[id number]$  of dietary cholesterol, 20-35 gm of total fiber daily;Understanding of distribution of calorie intake throughout the day with the consumption of 4-5 meals/snacks    Improve shortness of breath with ADL's Yes    Intervention Provide education, individualized exercise plan and daily activity instruction to help decrease symptoms of SOB with activities of daily living.    Expected Outcomes Short Term: Improve cardiorespiratory fitness to achieve a reduction of symptoms when performing ADLs;Long Term: Be able to perform more ADLs without symptoms or delay the onset of symptoms    Diabetes Yes    Intervention Provide education about signs/symptoms and action to take for hypo/hyperglycemia.;Provide education about proper nutrition, including hydration, and aerobic/resistive exercise prescription along with prescribed medications to achieve blood glucose in normal ranges: Fasting glucose 65-99 mg/dL    Expected Outcomes Short Term: Participant verbalizes understanding of the signs/symptoms and immediate care of hyper/hypoglycemia, proper foot care and importance of medication, aerobic/resistive exercise and nutrition plan for blood glucose control.;Long Term:  Attainment of HbA1C < 7%.             Education:Diabetes - Individual verbal and written instruction to review signs/symptoms of diabetes, desired ranges of glucose level fasting, after meals and with exercise. Acknowledge that pre and post exercise glucose checks will be done for 3 sessions at entry of program. Flowsheet Row Pulmonary Rehab from 12/15/2021 in [facility] Cardiac and Pulmonary Rehab  Date 12/12/21  Educator [facility]  Instruction Review Code 1- Verbalizes Understanding       Know Your Numbers and Heart Failure: - Group verbal and visual instruction to discuss disease risk factors for cardiac and pulmonary disease and treatment options.  Reviews associated critical values for Overweight/Obesity, Hypertension, Cholesterol, and Diabetes.  Discusses basics of heart failure: signs/symptoms and treatments.  Introduces Heart Failure Zone chart for action plan for heart failure.  Written material given at graduation.   Core Components/Risk Factors/Patient Goals Review:    Core Components/Risk Factors/Patient Goals at Discharge (Final Review):    ITP Comments:  ITP Comments     Row Name 12/12/21 1116 12/15/21 1421         ITP Comments Virtual Visit completed. Patient informed on EP and RD appointment and 6 Minute walk test. Patient also informed of patient health questionnaires on My Chart. Patient Verbalizes understanding. Visit diagnosis can be found in [facility] 11/09/2021. Completed 6MWT and gym orientation. Initial ITP created and sent for review to Dr Ottie Glazier, Medical Director.               Comments: Initial ITP

## 2021-12-15 NOTE — Patient Instructions (Signed)
Patient Instructions  Patient Details  Name: Brenda Ellison MRN: [id number] Date of Birth: Nov 26, 1960 Referring Provider:  Thad Ranger, MD  Below are your personal goals for exercise, nutrition, and risk factors. Our goal is to help you stay on track towards obtaining and maintaining these goals. We will be discussing your progress on these goals with you throughout the program.  Initial Exercise Prescription:  Initial Exercise Prescription - 12/15/21 1400       Date of Initial Exercise RX and Referring Provider   Date 12/15/21    Referring Provider Thad Ranger, MD      Oxygen   Maintain Oxygen Saturation 88% or higher      Treadmill   MPH 1.8    Grade 0.5    Minutes 15    METs 2.5      Recumbant Bike   Level 1    RPM 50    Watts 22    Minutes 15    METs 2.9      NuStep   Level 2    SPM 80    Minutes 15    METs 2.9      T5 Nustep   Level 1    SPM 80    Minutes 15    METs 2.9      Prescription Details   Frequency (times per week) 2    Duration Progress to 30 minutes of continuous aerobic without signs/symptoms of physical distress      Intensity   THRR 40-80% of Max Heartrate 100-140    Ratings of Perceived Exertion 11-13    Perceived Dyspnea 0-4      Progression   Progression Continue to progress workloads to maintain intensity without signs/symptoms of physical distress.      Resistance Training   Training Prescription Yes    Weight 3 lb    Reps 10-15             Exercise Goals: Frequency: Be able to perform aerobic exercise two to three times per week in program working toward 2-5 days per week of home exercise.  Intensity: Work with a perceived exertion of 11 (fairly light) - 15 (hard) while following your exercise prescription.  We will make changes to your prescription with you as you progress through the program.   Duration: Be able to do 30 to 45 minutes of continuous aerobic exercise in addition to a 5 minute warm-up and a 5 minute  cool-down routine.   Nutrition Goals: Your personal nutrition goals will be established when you do your nutrition analysis with the dietician.  The following are general nutrition guidelines to follow: Cholesterol < 200mg /day Sodium < 1500mg /day Fiber: Women over 50 yrs - 21 grams per day  Personal Goals:  Personal Goals and Risk Factors at Admission - 12/15/21 1427       Core Components/Risk Factors/Patient Goals on Admission    Weight Management Yes;Weight Loss    Intervention Weight Management: Develop a combined nutrition and exercise program designed to reach desired caloric intake, while maintaining appropriate intake of nutrient and fiber, sodium and fats, and appropriate energy expenditure required for the weight goal.;Weight Management: Provide education and appropriate resources to help participant work on and attain dietary goals.;Weight Management/Obesity: Establish reasonable short term and long term weight goals.    Admit Weight 174 lb 4.8 oz (79.1 kg)    Goal Weight: Short Term 170 lb (77.1 kg)    Goal Weight: Long Term 164 lb (74.4 kg)  Expected Outcomes Short Term: Continue to assess and modify interventions until short term weight is achieved;Long Term: Adherence to nutrition and physical activity/exercise program aimed toward attainment of established weight goal;Weight Loss: Understanding of general recommendations for a balanced deficit meal plan, which promotes 1-2 lb weight loss per week and includes a negative energy balance of [phone] kcal/d;Understanding recommendations for meals to include 15-35% energy as protein, 25-35% energy from fat, 35-60% energy from carbohydrates, less than 200mg  of dietary cholesterol, 20-35 gm of total fiber daily;Understanding of distribution of calorie intake throughout the day with the consumption of 4-5 meals/snacks    Improve shortness of breath with ADL's Yes    Intervention Provide education, individualized exercise plan and daily  activity instruction to help decrease symptoms of SOB with activities of daily living.    Expected Outcomes Short Term: Improve cardiorespiratory fitness to achieve a reduction of symptoms when performing ADLs;Long Term: Be able to perform more ADLs without symptoms or delay the onset of symptoms    Diabetes Yes    Intervention Provide education about signs/symptoms and action to take for hypo/hyperglycemia.;Provide education about proper nutrition, including hydration, and aerobic/resistive exercise prescription along with prescribed medications to achieve blood glucose in normal ranges: Fasting glucose 65-99 mg/dL    Expected Outcomes Short Term: Participant verbalizes understanding of the signs/symptoms and immediate care of hyper/hypoglycemia, proper foot care and importance of medication, aerobic/resistive exercise and nutrition plan for blood glucose control.;Long Term: Attainment of HbA1C < 7%.             Tobacco Use Initial Evaluation: Social History   Tobacco Use  Smoking Status Former   Packs/day: 1.00   Years: 22.00   Total pack years: 22.00   Types: Cigarettes   Start date: 02/21/1988   Quit date: 02/20/2010   Years since quitting: 11.8  Smokeless Tobacco Never    Exercise Goals and Review:  Exercise Goals     Row Name 12/15/21 1501             Exercise Goals   Increase Physical Activity Yes       Intervention Provide advice, education, support and counseling about physical activity/exercise needs.;Develop an individualized exercise prescription for aerobic and resistive training based on initial evaluation findings, risk stratification, comorbidities and participant's personal goals.       Expected Outcomes Short Term: Attend rehab on a regular basis to increase amount of physical activity.;Long Term: Add in home exercise to make exercise part of routine and to increase amount of physical activity.;Long Term: Exercising regularly at least 3-5 days a week.        Increase Strength and Stamina Yes       Intervention Provide advice, education, support and counseling about physical activity/exercise needs.;Develop an individualized exercise prescription for aerobic and resistive training based on initial evaluation findings, risk stratification, comorbidities and participant's personal goals.       Expected Outcomes Short Term: Increase workloads from initial exercise prescription for resistance, speed, and METs.;Long Term: Improve cardiorespiratory fitness, muscular endurance and strength as measured by increased METs and functional capacity (6MWT);Short Term: Perform resistance training exercises routinely during rehab and add in resistance training at home       Able to understand and use rate of perceived exertion (RPE) scale Yes       Intervention Provide education and explanation on how to use RPE scale       Expected Outcomes Short Term: Able to use RPE daily in rehab to  express subjective intensity level;Long Term:  Able to use RPE to guide intensity level when exercising independently       Able to understand and use Dyspnea scale Yes       Intervention Provide education and explanation on how to use Dyspnea scale       Expected Outcomes Long Term: Able to use Dyspnea scale to guide intensity level when exercising independently;Short Term: Able to use Dyspnea scale daily in rehab to express subjective sense of shortness of breath during exertion       Knowledge and understanding of Target Heart Rate Range (THRR) Yes       Intervention Provide education and explanation of THRR including how the numbers were predicted and where they are located for reference       Expected Outcomes Short Term: Able to state/look up THRR;Long Term: Able to use THRR to govern intensity when exercising independently;Short Term: Able to use daily as guideline for intensity in rehab       Able to check pulse independently Yes       Intervention Provide education and demonstration  on how to check pulse in carotid and radial arteries.;Review the importance of being able to check your own pulse for safety during independent exercise       Expected Outcomes Short Term: Able to explain why pulse checking is important during independent exercise;Long Term: Able to check pulse independently and accurately       Understanding of Exercise Prescription Yes       Intervention Provide education, explanation, and written materials on patient's individual exercise prescription       Expected Outcomes Short Term: Able to explain program exercise prescription;Long Term: Able to explain home exercise prescription to exercise independently

## 2021-12-21 ENCOUNTER — Encounter: Payer: Self-pay | Admitting: *Deleted

## 2021-12-21 DIAGNOSIS — D869 Sarcoidosis, unspecified: Secondary | ICD-10-CM

## 2021-12-21 NOTE — Progress Notes (Signed)
Pulmonary Individual Treatment Plan  Patient Details  Name: Brenda Ellison MRN: [id number] Date of Birth: 1960/05/04 Referring Provider:   Flowsheet Row Pulmonary Rehab from 12/15/2021 in [facility] Cardiac and Pulmonary Rehab  Referring Provider Thad Ranger, MD       Initial Encounter Date:  Flowsheet Row Pulmonary Rehab from 12/15/2021 in [facility] Cardiac and Pulmonary Rehab  Date 12/15/21       Visit Diagnosis: Sarcoidosis  Patient's Home Medications on Admission:  Current Outpatient Medications:    albuterol (VENTOLIN HFA) 108 (90 Base) MCG/ACT inhaler, Inhale 2 puffs into the lungs every 4 (four) hours as needed for wheezing or shortness of breath., Disp: 18 g, Rfl: 3   albuterol (VENTOLIN HFA) 108 (90 Base) MCG/ACT inhaler, 1 puff as needed Inhalation every 4 hrs (Patient not taking: Reported on 12/12/2021), Disp: , Rfl:    atorvastatin (LIPITOR) 20 MG tablet, TAKE 1 TABLET BY MOUTH AT BEDTIME, Disp: 30 tablet, Rfl: 0   benzonatate (TESSALON) 100 MG capsule, Take 1-2 capsules (100-200 mg total) by mouth 3 (three) times daily as needed for cough., Disp: 30 capsule, Rfl: 1   blood glucose meter kit and supplies, Dispense based on patient and insurance preference. Use up to four times daily as directed. (FOR ICD-10 E10.9, E11.9)., Disp: 1 each, Rfl: 0   budesonide-formoterol (SYMBICORT) 160-4.5 MCG/ACT inhaler, Inhale 2 puffs into the lungs 2 (two) times daily., Disp: 1 each, Rfl: 0   celecoxib (CELEBREX) 100 MG capsule, Take 100 mg by mouth 2 (two) times daily as needed. (Patient not taking: Reported on 12/12/2021), Disp: , Rfl:    celecoxib (CELEBREX) 100 MG capsule, Take 1 capsule by mouth 2 (two) times daily., Disp: , Rfl:    cetirizine (ZYRTEC ALLERGY) 10 MG tablet, 1 tablet Orally Once a day for 30 day(s), Disp: , Rfl:    Cholecalciferol (VITAMIN D3) 2000 UNITS capsule, Take 2,000 Units by mouth daily., Disp: , Rfl:    fluticasone (FLONASE) 50 MCG/ACT nasal spray, Place 2 sprays  into both nostrils daily., Disp: 16 g, Rfl: 6   gabapentin (NEURONTIN) 100 MG capsule, Take 100 mg by mouth 2 (two) times daily., Disp: , Rfl:    levocetirizine (XYZAL) 5 MG tablet, Take 1 tablet (5 mg total) by mouth every evening., Disp: 90 tablet, Rfl: 1   meclizine (ANTIVERT) 12.5 MG tablet, Take 1 tablet (12.5 mg total) by mouth 3 (three) times daily as needed for dizziness., Disp: 30 tablet, Rfl: 0   metFORMIN (GLUCOPHAGE-XR) 500 MG 24 hr tablet, Take 2 tablets (1,000 mg total) by mouth 2 (two) times daily with a meal., Disp: 360 tablet, Rfl: 1   montelukast (SINGULAIR) 10 MG tablet, Take 1 tablet (10 mg total) by mouth at bedtime., Disp: 30 tablet, Rfl: 3   pantoprazole (PROTONIX) 20 MG tablet, Take 20 mg by mouth daily., Disp: , Rfl:    promethazine-dextromethorphan (PROMETHAZINE-DM) 6.25-15 MG/5ML syrup, Take 2.5-5 mLs by mouth 3 (three) times daily as needed for cough. (Patient not taking: Reported on 12/12/2021), Disp: 118 mL, Rfl: 1   tiZANidine (ZANAFLEX) 4 MG tablet, Take 1 tablet (4 mg total) by mouth every 8 (eight) hours as needed for muscle spasms (muscle tightness)., Disp: 90 tablet, Rfl: 2  Past Medical History: Past Medical History:  Diagnosis Date   Arthritis pain    Bilateral leg pain    Chronic nasal congestion    Diabetes mellitus without complication (HCC)    Dizziness of unknown cause    Heart  murmur previously undiagnosed    Osteoarthritis, hand    Prediabetes 07/12/2016    Tobacco Use: Social History   Tobacco Use  Smoking Status Former   Packs/day: 1.00   Years: 22.00   Total pack years: 22.00   Types: Cigarettes   Start date: 02/21/1988   Quit date: 02/20/2010   Years since quitting: 11.8  Smokeless Tobacco Never    Labs: Review Flowsheet  More data exists      Latest Ref Rng & Units 02/17/2020 06/15/2020 09/07/2020 06/23/2021 09/28/2021  Labs for ITP Cardiac and Pulmonary Rehab  Cholestrol <200 mg/dL 176  - - 151  -  LDL (calc) mg/dL (calc) 95  - -  69  -  HDL-C > OR = 50 mg/dL 57  - - 47  -  Trlycerides <150 mg/dL 138  - - 263  -  Hemoglobin A1c <5.7 % of total Hgb 7.1  6.9  6.5  8.0  8.3      Pulmonary Assessment Scores:  Pulmonary Assessment Scores     Row Name 12/15/21 1423         ADL UCSD   SOB Score total 56     Rest 0     Walk 3     Stairs 5     Bath 3     Dress 3     Shop 4       CAT Score   CAT Score 15       mMRC Score   mMRC Score 1              UCSD: Self-administered rating of dyspnea associated with activities of daily living (ADLs) 6-point scale (0 = "not at all" to 5 = "maximal or unable to do because of breathlessness")  Scoring Scores range from 0 to 120.  Minimally important difference is 5 units  CAT: CAT can identify the health impairment of COPD patients and is better correlated with disease progression.  CAT has a scoring range of zero to 40. The CAT score is classified into four groups of low (less than 10), medium (10 - 20), high (21-30) and very high (31-40) based on the impact level of disease on health status. A CAT score over 10 suggests significant symptoms.  A worsening CAT score could be explained by an exacerbation, poor medication adherence, poor inhaler technique, or progression of COPD or comorbid conditions.  CAT MCID is 2 points  mMRC: mMRC (Modified Medical Research Council) Dyspnea Scale is used to assess the degree of baseline functional disability in patients of respiratory disease due to dyspnea. No minimal important difference is established. A decrease in score of 1 point or greater is considered a positive change.   Pulmonary Function Assessment:  Pulmonary Function Assessment - 12/12/21 1118       Breath   Shortness of Breath Yes;Limiting activity             Exercise Target Goals: Exercise Program Goal: Individual exercise prescription set using results from initial 6 min walk test and THRR while considering  patient's activity barriers and safety.    Exercise Prescription Goal: Initial exercise prescription builds to 30-45 minutes a day of aerobic activity, 2-3 days per week.  Home exercise guidelines will be given to patient during program as part of exercise prescription that the participant will acknowledge.  Education: Aerobic Exercise: - Group verbal and visual presentation on the components of exercise prescription. Introduces F.I.T.T principle from ACSM for exercise prescriptions.  Reviews F.I.T.T. principles of aerobic exercise including progression. Written material given at graduation. Flowsheet Row Pulmonary Rehab from 12/15/2021 in [facility] Cardiac and Pulmonary Rehab  Education need identified 12/15/21       Education: Resistance Exercise: - Group verbal and visual presentation on the components of exercise prescription. Introduces F.I.T.T principle from ACSM for exercise prescriptions  Reviews F.I.T.T. principles of resistance exercise including progression. Written material given at graduation.    Education: Exercise & Equipment Safety: - Individual verbal instruction and demonstration of equipment use and safety with use of the equipment. Flowsheet Row Pulmonary Rehab from 12/15/2021 in [facility] Cardiac and Pulmonary Rehab  Date 12/12/21  Educator [facility]  Instruction Review Code 1- Verbalizes Understanding       Education: Exercise Physiology & General Exercise Guidelines: - Group verbal and written instruction with models to review the exercise physiology of the cardiovascular system and associated critical values. Provides general exercise guidelines with specific guidelines to those with heart or lung disease.    Education: Flexibility, Balance, Mind/Body Relaxation: - Group verbal and visual presentation with interactive activity on the components of exercise prescription. Introduces F.I.T.T principle from ACSM for exercise prescriptions. Reviews F.I.T.T. principles of flexibility and balance exercise training including  progression. Also discusses the mind body connection.  Reviews various relaxation techniques to help reduce and manage stress (i.e. Deep breathing, progressive muscle relaxation, and visualization). Balance handout provided to take home. Written material given at graduation.   Activity Barriers & Risk Stratification:  Activity Barriers & Cardiac Risk Stratification - 12/15/21 1456       Activity Barriers & Cardiac Risk Stratification   Activity Barriers Deconditioning;Shortness of Breath             6 Minute Walk:  6 Minute Walk     Row Name 12/15/21 1448         6 Minute Walk   Distance 1080 feet     Walk Time 6 minutes     # of Rest Breaks 0     MPH 2.05     METS 2.9     RPE 11     Perceived Dyspnea  2     VO2 Peak 10.15     Symptoms Yes (comment)     Comments SOB, Fatigue     Resting HR 60 bpm     Resting BP 118/62     Resting Oxygen Saturation  95 %     Exercise Oxygen Saturation  during 6 min walk 92 %     Max Ex. HR 96 bpm     Max Ex. BP 132/84     2 Minute Post BP 120/70       Interval HR   1 Minute HR 83     2 Minute HR 91     3 Minute HR 91     4 Minute HR 93     5 Minute HR 96     6 Minute HR 95     2 Minute Post HR 67     Interval Heart Rate? Yes       Interval Oxygen   Interval Oxygen? Yes     Baseline Oxygen Saturation % 95 %     1 Minute Oxygen Saturation % 95 %     1 Minute Liters of Oxygen 0 L  RA     2 Minute Oxygen Saturation % 92 %     2 Minute Liters of Oxygen 0 L  RA  3 Minute Oxygen Saturation % 94 %     3 Minute Liters of Oxygen 0 L  RA     4 Minute Oxygen Saturation % 95 %     4 Minute Liters of Oxygen 0 L  RA     5 Minute Oxygen Saturation % 94 %     5 Minute Liters of Oxygen 0 L  RA     6 Minute Oxygen Saturation % 92 %     6 Minute Liters of Oxygen 0 L  RA     2 Minute Post Oxygen Saturation % 97 %     2 Minute Post Liters of Oxygen 0 L  RA             Oxygen Initial Assessment:  Oxygen Initial Assessment -  12/12/21 1117       Home Oxygen   Home Oxygen Device None    Sleep Oxygen Prescription None    Home Exercise Oxygen Prescription None    Home Resting Oxygen Prescription None      Initial 6 min Walk   Oxygen Used None      Program Oxygen Prescription   Program Oxygen Prescription None      Intervention   Short Term Goals To learn and understand importance of monitoring SPO2 with pulse oximeter and demonstrate accurate use of the pulse oximeter.;To learn and understand importance of maintaining oxygen saturations>88%;To learn and demonstrate proper pursed lip breathing techniques or other breathing techniques. ;To learn and demonstrate proper use of respiratory medications;To learn and exhibit compliance with exercise, home and travel O2 prescription    Long  Term Goals Verbalizes importance of monitoring SPO2 with pulse oximeter and return demonstration;Exhibits compliance with exercise, home  and travel O2 prescription;Maintenance of O2 saturations>88%;Exhibits proper breathing techniques, such as pursed lip breathing or other method taught during program session;Compliance with respiratory medication;Demonstrates proper use of MDI's             Oxygen Re-Evaluation:   Oxygen Discharge (Final Oxygen Re-Evaluation):   Initial Exercise Prescription:  Initial Exercise Prescription - 12/15/21 1400       Date of Initial Exercise RX and Referring Provider   Date 12/15/21    Referring Provider Thad Ranger, MD      Oxygen   Maintain Oxygen Saturation 88% or higher      Treadmill   MPH 1.8    Grade 0.5    Minutes 15    METs 2.5      Recumbant Bike   Level 1    RPM 50    Watts 22    Minutes 15    METs 2.9      NuStep   Level 2    SPM 80    Minutes 15    METs 2.9      T5 Nustep   Level 1    SPM 80    Minutes 15    METs 2.9      Prescription Details   Frequency (times per week) 2    Duration Progress to 30 minutes of continuous aerobic without  signs/symptoms of physical distress      Intensity   THRR 40-80% of Max Heartrate 100-140    Ratings of Perceived Exertion 11-13    Perceived Dyspnea 0-4      Progression   Progression Continue to progress workloads to maintain intensity without signs/symptoms of physical distress.      Resistance Training   Training Prescription Yes  Weight 3 lb    Reps 10-15             Perform Capillary Blood Glucose checks as needed.  Exercise Prescription Changes:   Exercise Prescription Changes     Row Name 12/15/21 1400             Response to Exercise   Blood Pressure (Admit) 118/62       Blood Pressure (Exercise) 132/84       Blood Pressure (Exit) 120/70       Heart Rate (Admit) 60 bpm       Heart Rate (Exercise) 96 bpm       Heart Rate (Exit) 67 bpm       Oxygen Saturation (Admit) 95 %       Oxygen Saturation (Exercise) 92 %       Oxygen Saturation (Exit) 97 %       Rating of Perceived Exertion (Exercise) 11       Perceived Dyspnea (Exercise) 2       Symptoms SOB, Fatigue       Comments 6MWT Results                Exercise Comments:   Exercise Goals and Review:   Exercise Goals     Row Name 12/15/21 1501             Exercise Goals   Increase Physical Activity Yes       Intervention Provide advice, education, support and counseling about physical activity/exercise needs.;Develop an individualized exercise prescription for aerobic and resistive training based on initial evaluation findings, risk stratification, comorbidities and participant's personal goals.       Expected Outcomes Short Term: Attend rehab on a regular basis to increase amount of physical activity.;Long Term: Add in home exercise to make exercise part of routine and to increase amount of physical activity.;Long Term: Exercising regularly at least 3-5 days a week.       Increase Strength and Stamina Yes       Intervention Provide advice, education, support and counseling about physical  activity/exercise needs.;Develop an individualized exercise prescription for aerobic and resistive training based on initial evaluation findings, risk stratification, comorbidities and participant's personal goals.       Expected Outcomes Short Term: Increase workloads from initial exercise prescription for resistance, speed, and METs.;Long Term: Improve cardiorespiratory fitness, muscular endurance and strength as measured by increased METs and functional capacity (6MWT);Short Term: Perform resistance training exercises routinely during rehab and add in resistance training at home       Able to understand and use rate of perceived exertion (RPE) scale Yes       Intervention Provide education and explanation on how to use RPE scale       Expected Outcomes Short Term: Able to use RPE daily in rehab to express subjective intensity level;Long Term:  Able to use RPE to guide intensity level when exercising independently       Able to understand and use Dyspnea scale Yes       Intervention Provide education and explanation on how to use Dyspnea scale       Expected Outcomes Long Term: Able to use Dyspnea scale to guide intensity level when exercising independently;Short Term: Able to use Dyspnea scale daily in rehab to express subjective sense of shortness of breath during exertion       Knowledge and understanding of Target Heart Rate Range (THRR) Yes  Intervention Provide education and explanation of THRR including how the numbers were predicted and where they are located for reference       Expected Outcomes Short Term: Able to state/look up THRR;Long Term: Able to use THRR to govern intensity when exercising independently;Short Term: Able to use daily as guideline for intensity in rehab       Able to check pulse independently Yes       Intervention Provide education and demonstration on how to check pulse in carotid and radial arteries.;Review the importance of being able to check your own pulse for  safety during independent exercise       Expected Outcomes Short Term: Able to explain why pulse checking is important during independent exercise;Long Term: Able to check pulse independently and accurately       Understanding of Exercise Prescription Yes       Intervention Provide education, explanation, and written materials on patient's individual exercise prescription       Expected Outcomes Short Term: Able to explain program exercise prescription;Long Term: Able to explain home exercise prescription to exercise independently                Exercise Goals Re-Evaluation :   Discharge Exercise Prescription (Final Exercise Prescription Changes):  Exercise Prescription Changes - 12/15/21 1400       Response to Exercise   Blood Pressure (Admit) 118/62    Blood Pressure (Exercise) 132/84    Blood Pressure (Exit) 120/70    Heart Rate (Admit) 60 bpm    Heart Rate (Exercise) 96 bpm    Heart Rate (Exit) 67 bpm    Oxygen Saturation (Admit) 95 %    Oxygen Saturation (Exercise) 92 %    Oxygen Saturation (Exit) 97 %    Rating of Perceived Exertion (Exercise) 11    Perceived Dyspnea (Exercise) 2    Symptoms SOB, Fatigue    Comments 6MWT Results             Nutrition:  Target Goals: Understanding of nutrition guidelines, daily intake of sodium <15101m, cholesterol <2062m calories 30% from fat and 7% or less from saturated fats, daily to have 5 or more servings of fruits and vegetables.  Education: All About Nutrition: -Group instruction provided by verbal, written material, interactive activities, discussions, models, and posters to present general guidelines for heart healthy nutrition including fat, fiber, MyPlate, the role of sodium in heart healthy nutrition, utilization of the nutrition label, and utilization of this knowledge for meal planning. Follow up email sent as well. Written material given at graduation.   Biometrics:  Pre Biometrics - 12/15/21 1501       Pre  Biometrics   Height 5' 4" (1.626 m)    Weight 174 lb 4.8 oz (79.1 kg)    Waist Circumference 44.5 inches    Hip Circumference 42.5 inches    Waist to Hip Ratio 1.05 %    BMI (Calculated) 29.9    Single Leg Stand 11.9 seconds   R             Nutrition Therapy Plan and Nutrition Goals:  Nutrition Therapy & Goals - 12/15/21 1158       Nutrition Therapy   Diet Heart healthy, low Na, T2DM MNT    Drug/Food Interactions Statins/Certain Fruits    Protein (specify units) 90-100g    Fiber 25 grams    Whole Grain Foods 3 servings    Saturated Fats 14 max. grams    Fruits and  Vegetables 8 servings/day    Sodium 2 grams      Personal Nutrition Goals   Nutrition Goal ST: read over materials, practice MyPlate guidelines, include balanced snacks during the day LT: Achieve and maintain A1C <7, eat consistent meals/snacks during the day (about 1-2 servings of CHO per snack and 3-5 servings of CHO per meals), include protein, fiber, and healthy fat at most meals/snacks    Comments 61 y.o. F admitted to pulmonary rehab for sarcoidosis. PMHx includes T2DM, GERD, DDD, HLD, osteoarthritis, former smoker. Relevant medications includes lipitor, vit D3, meclizine, metformin, pantoprazole, tizanidine. Last A1C 09/28/21 was 8.3. Verdelle had previously seen some regarding diabetes in nutrition this past summer, but has not been back. Discussed general heart healthy eating and T2DM MNT include pairing foods, quality and quantity of CHOs, eating consistently, hydration, and exercise. Geri feels that she does not eat very consistently right now - discussed some snacks that she could include like an apple with peanut butter.      Intervention Plan   Intervention Prescribe, educate and counsel regarding individualized specific dietary modifications aiming towards targeted core components such as weight, hypertension, lipid management, diabetes, heart failure and other comorbidities.;Nutrition handout(s) given to  patient.    Expected Outcomes Short Term Goal: Understand basic principles of dietary content, such as calories, fat, sodium, cholesterol and nutrients.;Short Term Goal: A plan has been developed with personal nutrition goals set during dietitian appointment.;Long Term Goal: Adherence to prescribed nutrition plan.             Nutrition Assessments:  MEDIFICTS Score Key: ?70 Need to make dietary changes  40-70 Heart Healthy Diet ? 40 Therapeutic Level Cholesterol Diet  Flowsheet Row Pulmonary Rehab from 12/15/2021 in [facility] Cardiac and Pulmonary Rehab  Picture Your Plate Total Score on Admission 71      Picture Your Plate Scores: <06 Unhealthy dietary pattern with much room for improvement. 41-50 Dietary pattern unlikely to meet recommendations for good health and room for improvement. 51-60 More healthful dietary pattern, with some room for improvement.  >60 Healthy dietary pattern, although there may be some specific behaviors that could be improved.   Nutrition Goals Re-Evaluation:   Nutrition Goals Discharge (Final Nutrition Goals Re-Evaluation):   Psychosocial: Target Goals: Acknowledge presence or absence of significant depression and/or stress, maximize coping skills, provide positive support system. Participant is able to verbalize types and ability to use techniques and skills needed for reducing stress and depression.   Education: Stress, Anxiety, and Depression - Group verbal and visual presentation to define topics covered.  Reviews how body is impacted by stress, anxiety, and depression.  Also discusses healthy ways to reduce stress and to treat/manage anxiety and depression.  Written material given at graduation.   Education: Sleep Hygiene -Provides group verbal and written instruction about how sleep can affect your health.  Define sleep hygiene, discuss sleep cycles and impact of sleep habits. Review good sleep hygiene tips.    Initial Review & Psychosocial  Screening:  Initial Psych Review & Screening - 12/12/21 1119       Initial Review   Current issues with None Identified      Family Dynamics   Good Support System? Yes    Comments Estephania can look to her sister inlaw and sons for support. She has church family and pastor for support as well. The normal stresses of daily life are her only stressors.      Barriers   Psychosocial barriers to participate in  program The patient should benefit from training in stress management and relaxation.      Screening Interventions   Interventions Encouraged to exercise;To provide support and resources with identified psychosocial needs;Provide feedback about the scores to participant    Expected Outcomes Short Term goal: Utilizing psychosocial counselor, staff and physician to assist with identification of specific Stressors or current issues interfering with healing process. Setting desired goal for each stressor or current issue identified.;Long Term Goal: Stressors or current issues are controlled or eliminated.;Short Term goal: Identification and review with participant of any Quality of Life or Depression concerns found by scoring the questionnaire.;Long Term goal: The participant improves quality of Life and PHQ9 Scores as seen by post scores and/or verbalization of changes             Quality of Life Scores:  Scores of 19 and below usually indicate a poorer quality of life in these areas.  A difference of  2-3 points is a clinically meaningful difference.  A difference of 2-3 points in the total score of the Quality of Life Index has been associated with significant improvement in overall quality of life, self-image, physical symptoms, and general health in studies assessing change in quality of life.  PHQ-9: Review Flowsheet  More data exists      12/15/2021 12/01/2021 10/17/2021 09/28/2021 09/19/2021  Depression screen PHQ 2/9  Decreased Interest 3 0 0 0 0  Down, Depressed, Hopeless 0 0 0 0 0   PHQ - 2 Score 3 0 0 0 0  Altered sleeping 3 0 - 0 -  Tired, decreased energy 3 0 - 0 -  Change in appetite 3 0 - 0 -  Feeling bad or failure about yourself  0 0 - 0 -  Trouble concentrating 0 0 - 0 -  Moving slowly or fidgety/restless 3 0 - 0 -  Suicidal thoughts 0 0 - 0 -  PHQ-9 Score 15 0 - 0 -  Difficult doing work/chores Not difficult at all Not difficult at all - Not difficult at all -   Interpretation of Total Score  Total Score Depression Severity:  1-4 = Minimal depression, 5-9 = Mild depression, 10-14 = Moderate depression, 15-19 = Moderately severe depression, 20-27 = Severe depression   Psychosocial Evaluation and Intervention:  Psychosocial Evaluation - 12/12/21 1122       Psychosocial Evaluation & Interventions   Interventions Encouraged to exercise with the program and follow exercise prescription;Relaxation education;Stress management education    Comments Ximenna can look to her sister inlaw and sons for support. She has church family and pastor for support as well. The normal stresses of daily life are her only stressors.    Expected Outcomes Short: Start [facility] to help with mood. Long: Maintain a healthy mental state.    Continue Psychosocial Services  Follow up required by staff             Psychosocial Re-Evaluation:   Psychosocial Discharge (Final Psychosocial Re-Evaluation):   Education: Education Goals: Education classes will be provided on a weekly basis, covering required topics. Participant will state understanding/return demonstration of topics presented.  Learning Barriers/Preferences:  Learning Barriers/Preferences - 12/12/21 1118       Learning Barriers/Preferences   Learning Barriers None    Learning Preferences None             General Pulmonary Education Topics:  Infection Prevention: - Provides verbal and written material to individual with discussion of infection control including proper hand washing  and proper equipment  cleaning during exercise session. Flowsheet Row Pulmonary Rehab from 12/15/2021 in [facility] Cardiac and Pulmonary Rehab  Date 12/12/21  Educator [facility]  Instruction Review Code 1- Verbalizes Understanding       Falls Prevention: - Provides verbal and written material to individual with discussion of falls prevention and safety. Flowsheet Row Pulmonary Rehab from 12/15/2021 in [facility] Cardiac and Pulmonary Rehab  Date 12/12/21  Educator [facility]  Instruction Review Code 1- Verbalizes Understanding       Chronic Lung Disease Review: - Group verbal instruction with posters, models, PowerPoint presentations and videos,  to review new updates, new respiratory medications, new advancements in procedures and treatments. Providing information on websites and "800" numbers for continued self-education. Includes information about supplement oxygen, available portable oxygen systems, continuous and intermittent flow rates, oxygen safety, concentrators, and Medicare reimbursement for oxygen. Explanation of Pulmonary Drugs, including class, frequency, complications, importance of spacers, rinsing mouth after steroid MDI's, and proper cleaning methods for nebulizers. Review of basic lung anatomy and physiology related to function, structure, and complications of lung disease. Review of risk factors. Discussion about methods for diagnosing sleep apnea and types of masks and machines for OSA. Includes a review of the use of types of environmental controls: home humidity, furnaces, filters, dust mite/pet prevention, HEPA vacuums. Discussion about weather changes, air quality and the benefits of nasal washing. Instruction on Warning signs, infection symptoms, calling MD promptly, preventive modes, and value of vaccinations. Review of effective airway clearance, coughing and/or vibration techniques. Emphasizing that all should Create an Action Plan. Written material given at graduation. Flowsheet Row Pulmonary Rehab from  12/15/2021 in [facility] Cardiac and Pulmonary Rehab  Education need identified 12/15/21       AED/CPR: - Group verbal and written instruction with the use of models to demonstrate the basic use of the AED with the basic ABC's of resuscitation.    Anatomy and Cardiac Procedures: - Group verbal and visual presentation and models provide information about basic cardiac anatomy and function. Reviews the testing methods done to diagnose heart disease and the outcomes of the test results. Describes the treatment choices: Medical Management, Angioplasty, or Coronary Bypass Surgery for treating various heart conditions including Myocardial Infarction, Angina, Valve Disease, and Cardiac Arrhythmias.  Written material given at graduation.   Medication Safety: - Group verbal and visual instruction to review commonly prescribed medications for heart and lung disease. Reviews the medication, class of the drug, and side effects. Includes the steps to properly store meds and maintain the prescription regimen.  Written material given at graduation.   Other: -Provides group and verbal instruction on various topics (see comments)   Knowledge Questionnaire Score:  Knowledge Questionnaire Score - 12/15/21 1422       Knowledge Questionnaire Score   Pre Score 14/18              Core Components/Risk Factors/Patient Goals at Admission:  Personal Goals and Risk Factors at Admission - 12/15/21 1427       Core Components/Risk Factors/Patient Goals on Admission    Weight Management Yes;Weight Loss    Intervention Weight Management: Develop a combined nutrition and exercise program designed to reach desired caloric intake, while maintaining appropriate intake of nutrient and fiber, sodium and fats, and appropriate energy expenditure required for the weight goal.;Weight Management: Provide education and appropriate resources to help participant work on and attain dietary goals.;Weight Management/Obesity:  Establish reasonable short term and long term weight goals.    Admit Weight  174 lb 4.8 oz (79.1 kg)    Goal Weight: Short Term 170 lb (77.1 kg)    Goal Weight: Long Term 164 lb (74.4 kg)    Expected Outcomes Short Term: Continue to assess and modify interventions until short term weight is achieved;Long Term: Adherence to nutrition and physical activity/exercise program aimed toward attainment of established weight goal;Weight Loss: Understanding of general recommendations for a balanced deficit meal plan, which promotes 1-2 lb weight loss per week and includes a negative energy balance of [id number] kcal/d;Understanding recommendations for meals to include 15-35% energy as protein, 25-35% energy from fat, 35-60% energy from carbohydrates, less than 223m of dietary cholesterol, 20-35 gm of total fiber daily;Understanding of distribution of calorie intake throughout the day with the consumption of 4-5 meals/snacks    Improve shortness of breath with ADL's Yes    Intervention Provide education, individualized exercise plan and daily activity instruction to help decrease symptoms of SOB with activities of daily living.    Expected Outcomes Short Term: Improve cardiorespiratory fitness to achieve a reduction of symptoms when performing ADLs;Long Term: Be able to perform more ADLs without symptoms or delay the onset of symptoms    Diabetes Yes    Intervention Provide education about signs/symptoms and action to take for hypo/hyperglycemia.;Provide education about proper nutrition, including hydration, and aerobic/resistive exercise prescription along with prescribed medications to achieve blood glucose in normal ranges: Fasting glucose 65-99 mg/dL    Expected Outcomes Short Term: Participant verbalizes understanding of the signs/symptoms and immediate care of hyper/hypoglycemia, proper foot care and importance of medication, aerobic/resistive exercise and nutrition plan for blood glucose control.;Long Term:  Attainment of HbA1C < 7%.             Education:Diabetes - Individual verbal and written instruction to review signs/symptoms of diabetes, desired ranges of glucose level fasting, after meals and with exercise. Acknowledge that pre and post exercise glucose checks will be done for 3 sessions at entry of program. [facility]  Date 12/12/21  Educator [facility] Instruction Review Code 1- Verbalizes Understanding       Know Your Numbers and Heart Failure: - Group verbal and visual instruction to discuss disease risk factors for cardiac and pulmonary disease and treatment options.  Reviews associated critical values for Overweight/Obesity, Hypertension, Cholesterol, and Diabetes.  Discusses basics of heart failure: signs/symptoms and treatments.  Introduces Heart Failure Zone chart for action plan for heart failure.  Written material given at graduation.   Core Components/Risk Factors/Patient Goals Review:    Core Components/Risk Factors/Patient Goals at Discharge (Final Review):    ITP Comments:  ITP Comments     Row Name 12/12/21 1116 12/15/21 1421 12/21/21 0852       ITP Comments Virtual Visit completed. Patient informed on EP and RD appointment and 6 Minute walk test. Patient also informed of patient health questionnaires on My Chart. Patient Verbalizes understanding. [facility]. Completed 6MWT and gym orientation. Initial ITP created and sent for review to Dr FOttie Glazier Medical Director. 30 Day review completed. Medical Director ITP review done, changes made as directed, and signed approval by Medical Director.    NEW TO PROGRAM              Comments:

## 2021-12-22 ENCOUNTER — Encounter: Payer: Self-pay | Attending: Family Medicine | Admitting: *Deleted

## 2021-12-22 DIAGNOSIS — D869 Sarcoidosis, unspecified: Secondary | ICD-10-CM | POA: Insufficient documentation

## 2021-12-23 ENCOUNTER — Other Ambulatory Visit: Payer: Self-pay | Admitting: Family Medicine

## 2021-12-23 DIAGNOSIS — E782 Mixed hyperlipidemia: Secondary | ICD-10-CM

## 2021-12-26 ENCOUNTER — Ambulatory Visit (INDEPENDENT_AMBULATORY_CARE_PROVIDER_SITE_OTHER): Payer: Self-pay | Admitting: Family Medicine

## 2021-12-26 ENCOUNTER — Ambulatory Visit: Payer: 59 | Admitting: Family Medicine

## 2021-12-26 ENCOUNTER — Encounter: Payer: Self-pay | Admitting: Family Medicine

## 2021-12-26 ENCOUNTER — Ambulatory Visit: Payer: Self-pay | Admitting: *Deleted

## 2021-12-26 VITALS — BP 110/80 | HR 71 | Temp 98.4°F | Resp 16 | Ht 64.0 in | Wt 177.3 lb

## 2021-12-26 DIAGNOSIS — D860 Sarcoidosis of lung: Secondary | ICD-10-CM

## 2021-12-26 DIAGNOSIS — E1165 Type 2 diabetes mellitus with hyperglycemia: Secondary | ICD-10-CM

## 2021-12-26 DIAGNOSIS — E119 Type 2 diabetes mellitus without complications: Secondary | ICD-10-CM

## 2021-12-26 LAB — POCT GLYCOSYLATED HEMOGLOBIN (HGB A1C): Hemoglobin A1C: 6.1 % — AB (ref 4.0–5.6)

## 2021-12-26 LAB — BASIC METABOLIC PANEL
BUN/Creatinine Ratio: 14 (calc) (ref 6–22)
BUN: 19 mg/dL (ref 7–25)
CO2: 30 mmol/L (ref 20–32)
Calcium: 9.9 mg/dL (ref 8.6–10.4)
Chloride: 105 mmol/L (ref 98–110)
Creat: 1.36 mg/dL — ABNORMAL HIGH (ref 0.50–1.03)
Glucose, Bld: 92 mg/dL (ref 65–99)
Potassium: 4.1 mmol/L (ref 3.5–5.3)
Sodium: 143 mmol/L (ref 135–146)

## 2021-12-26 MED ORDER — EMPAGLIFLOZIN 10 MG PO TABS: 10.0000 mg | ORAL_TABLET | Freq: Every day | ORAL | 0 refills | Status: DC

## 2021-12-26 MED ORDER — METFORMIN HCL ER 500 MG PO TB24: 500.0000 mg | ORAL_TABLET | Freq: Two times a day (BID) | ORAL | 1 refills | Status: DC

## 2021-12-26 NOTE — Patient Instructions (Addendum)
  Diet Recommendations for Diabetes   1. Eat at least 3 meals and 1-2 snacks per day. Never go more than 4-5 hours while awake without eating. Eat breakfast within the first hour of getting up.   2. Limit starchy foods to TWO per meal and ONE per snack. ONE portion of a starchy  food is equal to the following:   - ONE slice of bread (or its equivalent, such as half of a hamburger bun).   - 1/2 cup of a "scoopable" starchy food such as potatoes or rice.   - 15 grams of Total Carbohydrate as shown on food label.  3. Include at every meal: a protein food, a carb food, and vegetables and/or fruit.   - Obtain twice the volume of vegetables as protein or carbohydrate foods for both lunch and dinner.   - Fresh or frozen vegetables are best.   - Keep frozen vegetables on hand for a quick vegetable serving.       Starchy (carb) foods: Bread, rice, pasta, potatoes, corn, cereal, grits, crackers, bagels, muffins, all baked goods.  (Fruits, milk, and yogurt also have carbohydrate, but most of these foods will not spike your blood sugar as most starchy foods will.)  A few fruits do cause high blood sugars; use small portions of bananas (limit to 1/2 at a time), grapes, watermelon, oranges, and most tropical fruits.    Protein foods: Meat, fish, poultry, eggs, dairy foods, and beans such as pinto and kidney beans (beans also provide carbohydrate).      Here is an example of what a healthy plate looks like:    ? Make half your plate fruits and vegetables.     ? Focus on whole fruits.     ? Vary your veggies.  ? Make half your grains whole grains. -     ? Look for the word "whole" at the beginning of the ingredients list    ? Some whole-grain ingredients include whole oats, whole-wheat flour,        whole-grain corn, whole-grain brown rice, and whole rye.  ? Move to low-fat and fat-free milk or yogurt.  ? Vary your protein routine. - Meat, fish, poultry (chicken, turkey), eggs, beans (kidney,  pinto), dairy.  ? Drink and eat less sodium, saturated fat, and added sugars.  

## 2021-12-26 NOTE — Progress Notes (Signed)
   SUBJECTIVE:   CHIEF COMPLAINT / HPI:   Diabetes, Type 2 - Last A1c 8.3 09/2021 - Medications: metformin - Compliance: good. Not having as much diarrhea but still with abdominal cramps and loose stools. - Checking BG at home: yes, fasting this morning 130. - Diet: 24 hour recall: beef stew, potatoes, canned beans for dinner. This morning oatmeal. Sometimes skips a meal if not hungry. Drinking diet sodas. - Exercise: none - Eye exam: due - Foot exam: UTD - Microalbumin: UTD - Statin: yes - Denies symptoms of hypoglycemia, polyuria, polydipsia, numbness extremities, foot ulcers/trauma  Sarcoidosis - follows with [facility]. Looking at doing lung biopsy in Jan/Feb. Doing pulmonary rehab starting next week.   OBJECTIVE:   BP 110/80 (BP Location: Right Arm, Patient Position: Sitting, Cuff Size: Normal)   Pulse 71   Temp 98.4 F (36.9 C) (Oral)   Resp 16   Ht 5\' 4"  (1.626 m) Comment: per patient  Wt 177 lb 4.8 oz (80.4 kg)   SpO2 98%   BMI 30.43 kg/m   Gen: well appearing, in NAD Card: Reg rate Lungs: Comfortable WOB on RA Ext: WWP, no edema   ASSESSMENT/PLAN:   Type 2 diabetes mellitus without complication, without long-term current use of insulin (HCC) Improved, likely secondary to not eating as much when not hungry. A1c 6.1 today. Much discussion had regarding diabetic diet, seeing nutrition. With ongoing loose stools and abdominal discomfort, will trial switching metformin to jardiance. Obtaining labs. Encouraged updating eye exam. F/u 4 weeks after starting jardiance.   Sarcoidosis of lung ([place]) Continue to follow with Pulm. Agree with pulm rehab, getting active will likely continue to help diabetes as well.     Myles Gip, DO

## 2021-12-26 NOTE — Assessment & Plan Note (Signed)
Improved, likely secondary to not eating as much when not hungry. A1c 6.1 today. Much discussion had regarding diabetic diet, seeing nutrition. With ongoing loose stools and abdominal discomfort, will trial switching metformin to jardiance. Obtaining labs. Encouraged updating eye exam. F/u 4 weeks after starting jardiance.

## 2021-12-26 NOTE — Assessment & Plan Note (Signed)
Continue to follow with Pulm. Agree with pulm rehab, getting active will likely continue to help diabetes as well.

## 2021-12-27 ENCOUNTER — Encounter: Payer: Self-pay | Admitting: *Deleted

## 2021-12-27 DIAGNOSIS — D869 Sarcoidosis, unspecified: Secondary | ICD-10-CM

## 2021-12-27 LAB — GLUCOSE, CAPILLARY
Glucose-Capillary: 145 mg/dL — ABNORMAL HIGH (ref 70–99)
Glucose-Capillary: 118 mg/dL — ABNORMAL HIGH (ref 70–99)

## 2021-12-27 NOTE — Progress Notes (Signed)
Daily Session Note  Patient Details  Name: Brenda [place] MRN: [id number] Date of Birth: 16-Jan-1961 Referring Provider:   Flowsheet Row Pulmonary Rehab from 12/15/2021 in [facility] Cardiac and Pulmonary Rehab  Referring Provider Thad Ranger, MD       Encounter Date: 12/27/2021  Check In:  Session Check In - 12/27/21 1038       Check-In   Supervising physician immediately available to respond to emergencies See telemetry face sheet for immediately available ER MD    Location [facility]-Cardiac & Pulmonary Rehab    Staff Present Heath Lark, RN, BSN, CCRP;Jessica [place], MA, RCEP, CCRP, Marylynn Pearson, MS, ASCM CEP, Exercise Physiologist    Virtual Visit No    Medication changes reported     No    Fall or balance concerns reported    No    Warm-up and Cool-down Performed on first and last piece of equipment    Resistance Training Performed Yes    VAD Patient? No    PAD/SET Patient? No      Pain Assessment   Currently in Pain? No/denies                Social History   Tobacco Use  Smoking Status Former   Packs/day: 1.00   Years: 22.00   Total pack years: 22.00   Types: Cigarettes   Start date: 02/21/1988   Quit date: 02/20/2010   Years since quitting: 11.8  Smokeless Tobacco Never    Goals Met:  Proper associated with RPD/PD & O2 Sat Exercise tolerated well Personal goals reviewed No report of concerns or symptoms today  Goals Unmet:  Not Applicable  Comments: First full day of exercise!  Patient was oriented to gym and equipment including functions, settings, policies, and procedures.  Patient's individual exercise prescription and treatment plan were reviewed.  All starting workloads were established based on the results of the 6 minute walk test done at initial orientation visit.  The plan for exercise progression was also introduced and progression will be customized based on patient's performance and goals.    Dr. Emily Filbert is Medical Director for  [place].  Dr. Ottie Glazier is Medical Director for [facility] Pulmonary Rehabilitation.

## 2021-12-29 ENCOUNTER — Encounter: Payer: Medicaid Other | Admitting: *Deleted

## 2021-12-29 ENCOUNTER — Ambulatory Visit: Payer: Self-pay | Admitting: *Deleted

## 2021-12-29 ENCOUNTER — Ambulatory Visit: Payer: 59 | Admitting: Family Medicine

## 2021-12-29 DIAGNOSIS — D869 Sarcoidosis, unspecified: Secondary | ICD-10-CM

## 2021-12-29 LAB — GLUCOSE, CAPILLARY
Glucose-Capillary: 110 mg/dL — ABNORMAL HIGH (ref 70–99)
Glucose-Capillary: 80 mg/dL (ref 70–99)

## 2021-12-29 NOTE — Progress Notes (Signed)
Daily Session Note  Patient Details  Name: LAURINE KUYPER MRN: [id number] Date of Birth: Oct 18, 1960 Referring Provider:   Flowsheet Row Pulmonary Rehab from 12/15/2021 in [facility] Cardiac and Pulmonary Rehab  Referring Provider Thad Ranger, MD       Encounter Date: 12/29/2021  Check In:  Session Check In - 12/29/21 1040       Check-In   Supervising physician immediately available to respond to emergencies See telemetry face sheet for immediately available ER MD    Location [facility]-Cardiac & Pulmonary Rehab    Staff Present Coralie Keens, MS, ASCM CEP, Exercise Physiologist;Meredith Sherryll Burger, RN BSN;Joseph Rosebud Poles, RN, [place]    Virtual Visit No    Medication changes reported     No    Fall or balance concerns reported    No    Warm-up and Cool-down Performed on first and last piece of equipment    Resistance Training Performed Yes    VAD Patient? No    PAD/SET Patient? No      Pain Assessment   Currently in Pain? No/denies                Social History   Tobacco Use  Smoking Status Former   Packs/day: 1.00   Years: 22.00   Total pack years: 22.00   Types: Cigarettes   Start date: 02/21/1988   Quit date: 02/20/2010   Years since quitting: 11.8  Smokeless Tobacco Never    Goals Met:  Independence with exercise equipment Exercise tolerated well No report of concerns or symptoms today Strength training completed today  Goals Unmet:  Not Applicable  Comments: Pt able to follow exercise prescription today without complaint.  Will continue to monitor for progression.    Dr. Emily Filbert is Medical Director for [place].  Dr. Ottie Glazier is Medical Director for [facility] Pulmonary Rehabilitation.

## 2022-01-03 ENCOUNTER — Encounter: Payer: Medicaid Other | Admitting: *Deleted

## 2022-01-03 DIAGNOSIS — D869 Sarcoidosis, unspecified: Secondary | ICD-10-CM

## 2022-01-03 LAB — GLUCOSE, CAPILLARY
Glucose-Capillary: 148 mg/dL — ABNORMAL HIGH (ref 70–99)
Glucose-Capillary: 132 mg/dL — ABNORMAL HIGH (ref 70–99)

## 2022-01-03 NOTE — Progress Notes (Signed)
Daily Session Note  Patient Details  Name: Brenda [place] MRN: [id number] Date of Birth: September 23, 1960 Referring Provider:   Flowsheet Row Pulmonary Rehab from 12/15/2021 in [facility] Cardiac and Pulmonary Rehab  Referring Provider Thad Ranger, MD       Encounter Date: 01/03/2022  Check In:  Session Check In - 01/03/22 1044       Check-In   Supervising physician immediately available to respond to emergencies See telemetry face sheet for immediately available ER MD    Location [facility]-Cardiac & Pulmonary Rehab    Staff Present Heath Lark, RN, BSN, CCRP;Jessica [place], MA, RCEP, CCRP, Marylynn Pearson, MS, ASCM CEP, Exercise Physiologist    Virtual Visit No    Medication changes reported     No    Fall or balance concerns reported    No    Warm-up and Cool-down Performed on first and last piece of equipment    Resistance Training Performed Yes    VAD Patient? No    PAD/SET Patient? No      Pain Assessment   Currently in Pain? No/denies                Social History   Tobacco Use  Smoking Status Former   Packs/day: 1.00   Years: 22.00   Total pack years: 22.00   Types: Cigarettes   Start date: 02/21/1988   Quit date: 02/20/2010   Years since quitting: 11.8  Smokeless Tobacco Never    Goals Met:  Proper associated with RPD/PD & O2 Sat Exercise tolerated well No report of concerns or symptoms today  Goals Unmet:  Not Applicable  Comments: Pt able to follow exercise prescription today without complaint.  Will continue to monitor for progression.    Dr. Emily Filbert is Medical Director for [place].  Dr. Ottie Glazier is Medical Director for [facility] Pulmonary Rehabilitation.

## 2022-01-05 ENCOUNTER — Encounter: Payer: Medicaid Other | Admitting: *Deleted

## 2022-01-05 DIAGNOSIS — D869 Sarcoidosis, unspecified: Secondary | ICD-10-CM

## 2022-01-05 NOTE — Progress Notes (Signed)
Daily Session Note  Patient Details  Name: Brenda [place] MRN: [id number] Date of Birth: [birth year] Referring Provider:   Flowsheet Row Pulmonary Rehab from 12/15/2021 in [facility] Cardiac and Pulmonary Rehab  Referring Provider Thad Ranger, MD       Encounter Date: 01/05/2022  Check In:  Session Check In - 01/05/22 1124       Check-In   Supervising physician immediately available to respond to emergencies See telemetry face sheet for immediately available ER MD    Location [facility]-Cardiac & Pulmonary Rehab    Staff Present Alberteen Sam, MA, RCEP, CCRP, CCET;Joseph [place], Ernestina Patches, RN, [place]    Virtual Visit No    Medication changes reported     No    Fall or balance concerns reported    No    Warm-up and Cool-down Performed on first and last piece of equipment    Resistance Training Performed Yes    VAD Patient? No    PAD/SET Patient? No      Pain Assessment   Currently in Pain? No/denies                Social History   Tobacco Use  Smoking Status Former   Packs/day: 1.00   Years: 22.00   Total pack years: 22.00   Types: Cigarettes   Start date: 02/21/1988   Quit date: 02/20/2010   Years since quitting: 11.8  Smokeless Tobacco Never    Goals Met:  Independence with exercise equipment Exercise tolerated well No report of concerns or symptoms today Strength training completed today  Goals Unmet:  Not Applicable  Comments: Pt able to follow exercise prescription today without complaint.  Will continue to monitor for progression.    Dr. Emily Filbert is Medical Director for [place].  Dr. Ottie Glazier is Medical Director for [facility] Pulmonary Rehabilitation.

## 2022-01-06 ENCOUNTER — Telehealth: Payer: Self-pay | Admitting: Family Medicine

## 2022-01-06 ENCOUNTER — Other Ambulatory Visit: Payer: Self-pay | Admitting: Family Medicine

## 2022-01-06 MED ORDER — DAPAGLIFLOZIN PROPANEDIOL 5 MG PO TABS: 5.0000 mg | ORAL_TABLET | Freq: Every day | ORAL | 5 refills | Status: DC

## 2022-01-06 NOTE — Telephone Encounter (Signed)
Yes [facility].

## 2022-01-06 NOTE — Telephone Encounter (Signed)
She just recently seen Dr.Rumball, do you nee her to do a visit with you?

## 2022-01-06 NOTE — Progress Notes (Unsigned)
Changing jardiance to farxiga to see if price is better for pt Brenda Berry, PA-C

## 2022-01-06 NOTE — Telephone Encounter (Signed)
No answer from pt left vm to call pharmacy for medication has been sent.

## 2022-01-06 NOTE — Telephone Encounter (Signed)
The patient called in stating Dr Linwood Dibbles prescribed [place] Pepper but that it is too expensive and she is wondering if she can get a prescription for Marcelline Deist if that is a reasonable price. She goes through the marketplace for her insurance with [employer] silver plan. Please assist patient further.

## 2022-01-17 ENCOUNTER — Encounter: Payer: Medicaid Other | Admitting: *Deleted

## 2022-01-17 DIAGNOSIS — D869 Sarcoidosis, unspecified: Secondary | ICD-10-CM

## 2022-01-17 NOTE — Progress Notes (Signed)
Daily Session Note  Patient Details  Name: Brenda [place] MRN: [id number] Date of Birth: 04/07/1960 Referring Provider:   Flowsheet Row Pulmonary Rehab from 12/15/2021 in [facility] Cardiac and Pulmonary Rehab  Referring Provider Thad Ranger, MD       Encounter Date: 01/17/2022  Check In:  Session Check In - 01/17/22 0955       Check-In   Supervising physician immediately available to respond to emergencies See telemetry face sheet for immediately available ER MD    Location [facility]-Cardiac & Pulmonary Rehab    Staff Present Renita Papa, RN Margurite Auerbach, MS, ASCM CEP, Exercise Physiologist;Jessica Luan [place], MA, RCEP, CCRP, CCET    Virtual Visit No    Medication changes reported     No    Fall or balance concerns reported    No    Warm-up and Cool-down Performed on first and last piece of equipment    Resistance Training Performed Yes    VAD Patient? No    PAD/SET Patient? No      Pain Assessment   Currently in Pain? No/denies                Social History   Tobacco Use  Smoking Status Former   Packs/day: 1.00   Years: 22.00   Total pack years: 22.00   Types: Cigarettes   Start date: 02/21/1988   Quit date: 02/20/2010   Years since quitting: 11.9  Smokeless Tobacco Never    Goals Met:  Independence with exercise equipment Exercise tolerated well No report of concerns or symptoms today Strength training completed today  Goals Unmet:  Not Applicable  Comments: Pt able to follow exercise prescription today without complaint.  Will continue to monitor for progression.    Dr. Emily Filbert is Medical Director for [place].  Dr. Ottie Glazier is Medical Director for [facility] Pulmonary Rehabilitation.

## 2022-01-18 ENCOUNTER — Encounter: Payer: Self-pay | Admitting: *Deleted

## 2022-01-18 DIAGNOSIS — D869 Sarcoidosis, unspecified: Secondary | ICD-10-CM

## 2022-01-18 NOTE — Progress Notes (Signed)
Pulmonary Individual Treatment Plan  Patient Details  Name: Brenda Ellison MRN: [id number] Date of Birth: 05/24/1960 Referring Provider:   Flowsheet Row Pulmonary Rehab from 12/15/2021 in [facility] Cardiac and Pulmonary Rehab  Referring Provider Thad Ranger, MD       Initial Encounter Date:  Flowsheet Row Pulmonary Rehab from 12/15/2021 in [facility] Cardiac and Pulmonary Rehab  Date 12/15/21       Visit Diagnosis: Sarcoidosis  Patient's Home Medications on Admission:  Current Outpatient Medications:    albuterol (VENTOLIN HFA) 108 (90 Base) MCG/ACT inhaler, Inhale 2 puffs into the lungs every 4 (four) hours as needed for wheezing or shortness of breath., Disp: 18 g, Rfl: 3   albuterol (VENTOLIN HFA) 108 (90 Base) MCG/ACT inhaler, 1 puff as needed Inhalation every 4 hrs (Patient not taking: Reported on 12/12/2021), Disp: , Rfl:    atorvastatin (LIPITOR) 20 MG tablet, TAKE 1 TABLET BY MOUTH AT BEDTIME, Disp: 30 tablet, Rfl: 8   benzonatate (TESSALON) 100 MG capsule, Take 1-2 capsules (100-200 mg total) by mouth 3 (three) times daily as needed for cough., Disp: 30 capsule, Rfl: 1   blood glucose meter kit and supplies, Dispense based on patient and insurance preference. Use up to four times daily as directed. (FOR ICD-10 E10.9, E11.9)., Disp: 1 each, Rfl: 0   budesonide-formoterol (SYMBICORT) 160-4.5 MCG/ACT inhaler, Inhale 2 puffs into the lungs 2 (two) times daily., Disp: 1 each, Rfl: 0   celecoxib (CELEBREX) 100 MG capsule, Take 1 capsule by mouth 2 (two) times daily., Disp: , Rfl:    cetirizine (ZYRTEC ALLERGY) 10 MG tablet, 1 tablet Orally Once a day for 30 day(s), Disp: , Rfl:    Cholecalciferol (VITAMIN D3) 2000 UNITS capsule, Take 2,000 Units by mouth daily., Disp: , Rfl:    dapagliflozin propanediol (FARXIGA) 5 MG TABS tablet, Take 1 tablet (5 mg total) by mouth daily before breakfast., Disp: 30 tablet, Rfl: 5   fluticasone (FLONASE) 50 MCG/ACT nasal spray, Place 2 sprays into both  nostrils daily., Disp: 16 g, Rfl: 6   gabapentin (NEURONTIN) 100 MG capsule, Take 100 mg by mouth 2 (two) times daily., Disp: , Rfl:    levocetirizine (XYZAL) 5 MG tablet, Take 1 tablet (5 mg total) by mouth every evening., Disp: 90 tablet, Rfl: 1   meclizine (ANTIVERT) 12.5 MG tablet, Take 1 tablet (12.5 mg total) by mouth 3 (three) times daily as needed for dizziness., Disp: 30 tablet, Rfl: 0   montelukast (SINGULAIR) 10 MG tablet, Take 1 tablet (10 mg total) by mouth at bedtime. (Patient not taking: Reported on 12/26/2021), Disp: 30 tablet, Rfl: 3   pantoprazole (PROTONIX) 20 MG tablet, Take 20 mg by mouth daily., Disp: , Rfl:    promethazine-dextromethorphan (PROMETHAZINE-DM) 6.25-15 MG/5ML syrup, Take 2.5-5 mLs by mouth 3 (three) times daily as needed for cough. (Patient not taking: Reported on 12/12/2021), Disp: 118 mL, Rfl: 1   tiZANidine (ZANAFLEX) 4 MG tablet, Take 1 tablet (4 mg total) by mouth every 8 (eight) hours as needed for muscle spasms (muscle tightness)., Disp: 90 tablet, Rfl: 2  Past Medical History: Past Medical History:  Diagnosis Date   Arthritis pain    Bilateral leg pain    Chronic nasal congestion    Diabetes mellitus without complication (HCC)    Dizziness of unknown cause    Heart murmur previously undiagnosed    Osteoarthritis, hand    Prediabetes 07/12/2016    Tobacco Use: Social History   Tobacco Use  Smoking  Status Former   Packs/day: 1.00   Years: 22.00   Total pack years: 22.00   Types: Cigarettes   Start date: 02/21/1988   Quit date: 02/20/2010   Years since quitting: 11.9  Smokeless Tobacco Never    Labs: Review Flowsheet  More data exists      Latest Ref Rng & Units 06/15/2020 09/07/2020 06/23/2021 09/28/2021 12/26/2021  Labs for ITP Cardiac and Pulmonary Rehab  Cholestrol <200 mg/dL - - 151  - -  LDL (calc) mg/dL (calc) - - 69  - -  HDL-C > OR = 50 mg/dL - - 47  - -  Trlycerides <150 mg/dL - - 263  - -  Hemoglobin A1c 4.0 - 5.6 % 6.9  6.5  8.0   8.3  6.1      Pulmonary Assessment Scores:  Pulmonary Assessment Scores     Row Name 12/15/21 1423         ADL UCSD   SOB Score total 56     Rest 0     Walk 3     Stairs 5     Bath 3     Dress 3     Shop 4       CAT Score   CAT Score 15       mMRC Score   mMRC Score 1              UCSD: Self-administered rating of dyspnea associated with activities of daily living (ADLs) 6-point scale (0 = "not at all" to 5 = "maximal or unable to do because of breathlessness")  Scoring Scores range from 0 to 120.  Minimally important difference is 5 units  CAT: CAT can identify the health impairment of COPD patients and is better correlated with disease progression.  CAT has a scoring range of zero to 40. The CAT score is classified into four groups of low (less than 10), medium (10 - 20), high (21-30) and very high (31-40) based on the impact level of disease on health status. A CAT score over 10 suggests significant symptoms.  A worsening CAT score could be explained by an exacerbation, poor medication adherence, poor inhaler technique, or progression of COPD or comorbid conditions.  CAT MCID is 2 points  mMRC: mMRC (Modified Medical Research Council) Dyspnea Scale is used to assess the degree of baseline functional disability in patients of respiratory disease due to dyspnea. No minimal important difference is established. A decrease in score of 1 point or greater is considered a positive change.   Pulmonary Function Assessment:  Pulmonary Function Assessment - 12/12/21 1118       Breath   Shortness of Breath Yes;Limiting activity             Exercise Target Goals: Exercise Program Goal: Individual exercise prescription set using results from initial 6 min walk test and THRR while considering  patient's activity barriers and safety.   Exercise Prescription Goal: Initial exercise prescription builds to 30-45 minutes a day of aerobic activity, 2-3 days per week.  Home  exercise guidelines will be given to patient during program as part of exercise prescription that the participant will acknowledge.  Education: Aerobic Exercise: - Group verbal and visual presentation on the components of exercise prescription. Introduces F.I.T.T principle from ACSM for exercise prescriptions.  Reviews F.I.T.T. principles of aerobic exercise including progression. Written material given at graduation. Flowsheet Row Pulmonary Rehab from 01/05/2022 in [facility] Cardiac and Pulmonary Rehab  Education need identified 12/15/21  Date 12/29/21  Educator [facility]  Instruction Review Code 1- Verbalizes Understanding       Education: Resistance Exercise: - Group verbal and visual presentation on the components of exercise prescription. Introduces F.I.T.T principle from ACSM for exercise prescriptions  Reviews F.I.T.T. principles of resistance exercise including progression. Written material given at graduation.    Education: Exercise & Equipment Safety: - Individual verbal instruction and demonstration of equipment use and safety with use of the equipment. Flowsheet Row Pulmonary Rehab from 01/05/2022 in [facility] Cardiac and Pulmonary Rehab  Date 12/12/21  Educator [facility]  Instruction Review Code 1- Verbalizes Understanding       Education: Exercise Physiology & General Exercise Guidelines: - Group verbal and written instruction with models to review the exercise physiology of the cardiovascular system and associated critical values. Provides general exercise guidelines with specific guidelines to those with heart or lung disease.    Education: Flexibility, Balance, Mind/Body Relaxation: - Group verbal and visual presentation with interactive activity on the components of exercise prescription. Introduces F.I.T.T principle from ACSM for exercise prescriptions. Reviews F.I.T.T. principles of flexibility and balance exercise training including progression. Also discusses the mind body connection.   Reviews various relaxation techniques to help reduce and manage stress (i.e. Deep breathing, progressive muscle relaxation, and visualization). Balance handout provided to take home. Written material given at graduation.   Activity Barriers & Risk Stratification:  Activity Barriers & Cardiac Risk Stratification - 12/15/21 1456       Activity Barriers & Cardiac Risk Stratification   Activity Barriers Deconditioning;Shortness of Breath             6 Minute Walk:  6 Minute Walk     Row Name 12/15/21 1448         6 Minute Walk   Distance 1080 feet     Walk Time 6 minutes     # of Rest Breaks 0     MPH 2.05     METS 2.9     RPE 11     Perceived Dyspnea  2     VO2 Peak 10.15     Symptoms Yes (comment)     Comments SOB, Fatigue     Resting HR 60 bpm     Resting BP 118/62     Resting Oxygen Saturation  95 %     Exercise Oxygen Saturation  during 6 min walk 92 %     Max Ex. HR 96 bpm     Max Ex. BP 132/84     2 Minute Post BP 120/70       Interval HR   1 Minute HR 83     2 Minute HR 91     3 Minute HR 91     4 Minute HR 93     5 Minute HR 96     6 Minute HR 95     2 Minute Post HR 67     Interval Heart Rate? Yes       Interval Oxygen   Interval Oxygen? Yes     Baseline Oxygen Saturation % 95 %     1 Minute Oxygen Saturation % 95 %     1 Minute Liters of Oxygen 0 L  RA     2 Minute Oxygen Saturation % 92 %     2 Minute Liters of Oxygen 0 L  RA     3 Minute Oxygen Saturation % 94 %     3 Minute Liters of  Oxygen 0 L  RA     4 Minute Oxygen Saturation % 95 %     4 Minute Liters of Oxygen 0 L  RA     5 Minute Oxygen Saturation % 94 %     5 Minute Liters of Oxygen 0 L  RA     6 Minute Oxygen Saturation % 92 %     6 Minute Liters of Oxygen 0 L  RA     2 Minute Post Oxygen Saturation % 97 %     2 Minute Post Liters of Oxygen 0 L  RA             Oxygen Initial Assessment:  Oxygen Initial Assessment - 12/27/21 1040       Home Oxygen   Home Oxygen Device  None    Sleep Oxygen Prescription None    Home Exercise Oxygen Prescription None    Home Resting Oxygen Prescription None    Compliance with Home Oxygen Use Yes      Intervention   Short Term Goals To learn and demonstrate proper pursed lip breathing techniques or other breathing techniques.     Long  Term Goals Exhibits proper breathing techniques, such as pursed lip breathing or other method taught during program session             Oxygen Re-Evaluation:  Oxygen Re-Evaluation     [place] Name 12/27/21 1041 01/05/22 1008           Program Oxygen Prescription   Program Oxygen Prescription -- None        Home Oxygen   Home Oxygen Device -- None      Sleep Oxygen Prescription -- None      Home Exercise Oxygen Prescription -- None      Home Resting Oxygen Prescription -- None      Compliance with Home Oxygen Use -- Yes        Goals/Expected Outcomes   Short Term Goals -- To learn and demonstrate proper pursed lip breathing techniques or other breathing techniques.       Long  Term Goals -- Exhibits proper breathing techniques, such as pursed lip breathing or other method taught during program session      Comments Reviewed PLB technique with pt.  Talked about how it works and it's importance in maintaining their exercise saturations. Denym recently started rehab and reports doing well. She reports her shortness of breath will get worse by "doing to much" - she feels this more right now as she is moving next Monday. She does not check her O2 at home - encouraged to use a pulse oximeter. Brenda Ellison reports that she uses PLB and feels this helps      Goals/Expected Outcomes Short: Become more profiecient at using PLB. Long: Become independent at using PLB. Short: Become more profiecient at using PLB, consider getting a pulse oximeter  Long: Become independent at using PLB.               Oxygen Discharge (Final Oxygen Re-Evaluation):  Oxygen Re-Evaluation - 01/05/22 1008       Program  Oxygen Prescription   Program Oxygen Prescription None      Home Oxygen   Home Oxygen Device None    Sleep Oxygen Prescription None    Home Exercise Oxygen Prescription None    Home Resting Oxygen Prescription None    Compliance with Home Oxygen Use Yes      Goals/Expected Outcomes  Short Term Goals To learn and demonstrate proper pursed lip breathing techniques or other breathing techniques.     Long  Term Goals Exhibits proper breathing techniques, such as pursed lip breathing or other method taught during program session    Comments Cherika recently started rehab and reports doing well. She reports her shortness of breath will get worse by "doing to much" - she feels this more right now as she is moving next Monday. She does not check her O2 at home - encouraged to use a pulse oximeter. Brenda Ellison reports that she uses PLB and feels this helps    Goals/Expected Outcomes Short: Become more profiecient at using PLB, consider getting a pulse oximeter  Long: Become independent at using PLB.             Initial Exercise Prescription:  Initial Exercise Prescription - 12/15/21 1400       Date of Initial Exercise RX and Referring Provider   Date 12/15/21    Referring Provider Thad Ranger, MD      Oxygen   Maintain Oxygen Saturation 88% or higher      Treadmill   MPH 1.8    Grade 0.5    Minutes 15    METs 2.5      Recumbant Bike   Level 1    RPM 50    Watts 22    Minutes 15    METs 2.9      NuStep   Level 2    SPM 80    Minutes 15    METs 2.9      T5 Nustep   Level 1    SPM 80    Minutes 15    METs 2.9      Prescription Details   Frequency (times per week) 2    Duration Progress to 30 minutes of continuous aerobic without signs/symptoms of physical distress      Intensity   THRR 40-80% of Max Heartrate 100-140    Ratings of Perceived Exertion 11-13    Perceived Dyspnea 0-4      Progression   Progression Continue to progress workloads to maintain intensity  without signs/symptoms of physical distress.      Resistance Training   Training Prescription Yes    Weight 3 lb    Reps 10-15             Perform Capillary Blood Glucose checks as needed.  Exercise Prescription Changes:   Exercise Prescription Changes     Row Name 12/15/21 1400 01/16/22 1100           Response to Exercise   Blood Pressure (Admit) 118/62 118/72      Blood Pressure (Exercise) 132/84 134/70      Blood Pressure (Exit) 120/70 122/82      Heart Rate (Admit) 60 bpm 69 bpm      Heart Rate (Exercise) 96 bpm 96 bpm      Heart Rate (Exit) 67 bpm 69 bpm      Oxygen Saturation (Admit) 95 % 95 %      Oxygen Saturation (Exercise) 92 % 89 %      Oxygen Saturation (Exit) 97 % 98 %      Rating of Perceived Exertion (Exercise) 11 15      Perceived Dyspnea (Exercise) 2 3      Symptoms SOB, Fatigue SOB      Comments 6MWT Results --      Duration -- Progress to 30 minutes  of  aerobic without signs/symptoms of physical distress      Intensity -- THRR unchanged        Progression   Progression -- Continue to progress workloads to maintain intensity without signs/symptoms of physical distress.      Average METs -- 1.88        Resistance Training   Training Prescription -- Yes      Weight -- 3 lb      Reps -- 10-15        Interval Training   Interval Training -- No        Treadmill   MPH -- 0.6      Grade -- 0.5      Minutes -- 15      METs -- 1.5        Recumbant Bike   Level -- 1      Watts -- 10      Minutes -- 15        NuStep   Level -- 2      Minutes -- 15      METs -- 2.1        T5 Nustep   Level -- 1      Minutes -- 15      METs -- 1.9        Track   Laps -- 19      Minutes -- 15      METs -- 2.03        Oxygen   Maintain Oxygen Saturation -- 88% or higher               Exercise Comments:   Exercise Comments     Row Name 12/27/21 1040           Exercise Comments First full day of exercise!  Patient was oriented to gym  and equipment including functions, settings, policies, and procedures.  Patient's individual exercise prescription and treatment plan were reviewed.  All starting workloads were established based on the results of the 6 minute walk test done at initial orientation visit.  The plan for exercise progression was also introduced and progression will be customized based on patient's performance and goals.                Exercise Goals and Review:   Exercise Goals     Row Name 12/15/21 1501             Exercise Goals   Increase Physical Activity Yes       Intervention Provide advice, education, support and counseling about physical activity/exercise needs.;Develop an individualized exercise prescription for aerobic and resistive training based on initial evaluation findings, risk stratification, comorbidities and participant's personal goals.       Expected Outcomes Short Term: Attend rehab on a regular basis to increase amount of physical activity.;Long Term: Add in home exercise to make exercise part of routine and to increase amount of physical activity.;Long Term: Exercising regularly at least 3-5 days a week.       Increase Strength and Stamina Yes       Intervention Provide advice, education, support and counseling about physical activity/exercise needs.;Develop an individualized exercise prescription for aerobic and resistive training based on initial evaluation findings, risk stratification, comorbidities and participant's personal goals.       Expected Outcomes Short Term: Increase workloads from initial exercise prescription for resistance, speed, and METs.;Long Term: Improve cardiorespiratory fitness, muscular endurance and strength as measured by increased  METs and functional capacity (6MWT);Short Term: Perform resistance training exercises routinely during rehab and add in resistance training at home       Able to understand and use rate of perceived exertion (RPE) scale Yes        Intervention Provide education and explanation on how to use RPE scale       Expected Outcomes Short Term: Able to use RPE daily in rehab to express subjective intensity level;Long Term:  Able to use RPE to guide intensity level when exercising independently       Able to understand and use Dyspnea scale Yes       Intervention Provide education and explanation on how to use Dyspnea scale       Expected Outcomes Long Term: Able to use Dyspnea scale to guide intensity level when exercising independently;Short Term: Able to use Dyspnea scale daily in rehab to express subjective sense of shortness of breath during exertion       Knowledge and understanding of Target Heart Rate Range (THRR) Yes       Intervention Provide education and explanation of THRR including how the numbers were predicted and where they are located for reference       Expected Outcomes Short Term: Able to state/look up THRR;Long Term: Able to use THRR to govern intensity when exercising independently;Short Term: Able to use daily as guideline for intensity in rehab       Able to check pulse independently Yes       Intervention Provide education and demonstration on how to check pulse in carotid and radial arteries.;Review the importance of being able to check your own pulse for safety during independent exercise       Expected Outcomes Short Term: Able to explain why pulse checking is important during independent exercise;Long Term: Able to check pulse independently and accurately       Understanding of Exercise Prescription Yes       Intervention Provide education, explanation, and written materials on patient's individual exercise prescription       Expected Outcomes Short Term: Able to explain program exercise prescription;Long Term: Able to explain home exercise prescription to exercise independently                Exercise Goals Re-Evaluation :  Exercise Goals Re-Evaluation     Row Name 12/27/21 1040 01/05/22 0959  01/16/22 1133         Exercise Goal Re-Evaluation   Exercise Goals Review Able to understand and use rate of perceived exertion (RPE) scale;Knowledge and understanding of Target Heart Rate Range (THRR);Able to understand and use Dyspnea scale;Able to check pulse independently;Understanding of Exercise Prescription Able to understand and use rate of perceived exertion (RPE) scale;Knowledge and understanding of Target Heart Rate Range (THRR);Able to understand and use Dyspnea scale;Able to check pulse independently;Understanding of Exercise Prescription Increase Physical Activity;Increase Strength and Stamina;Understanding of Exercise Prescription     Comments Reviewed RPE scale, THR and program prescription with pt today.  Pt voiced understanding and was given a copy of goals to take home. Brenda Ellison reports doing well in rehab, but reports not working out at home right now becuase she is moving next Monday so she has been active. She has not met with EP yet to go over home exercise. Brenda Ellison has had a good start to rehab the last couple of weeks. She was able to complete 19 laps on the track, she did try the treadmill which was more of a challenge  for her and was not able to pace as fast as she was able to walk at a 0.6 mph speed. If she wants to walk on the treadmill, we hope to see that improve over time. The other machines she worked on she worked at her initial exercise prescription well. Will continue to monitor.     Expected Outcomes Short: Use RPE daily to regulate intensity. Long: Follow program prescription in THR. Short: Meet with EP to go over home exercise Long: Follow program prescription in THR. Short: Continue to increase speed on treadmill Long: Continue to build up overall strength and stamina              Discharge Exercise Prescription (Final Exercise Prescription Changes):  Exercise Prescription Changes - 01/16/22 1100       Response to Exercise   Blood Pressure (Admit) 118/72    Blood  Pressure (Exercise) 134/70    Blood Pressure (Exit) 122/82    Heart Rate (Admit) 69 bpm    Heart Rate (Exercise) 96 bpm    Heart Rate (Exit) 69 bpm    Oxygen Saturation (Admit) 95 %    Oxygen Saturation (Exercise) 89 %    Oxygen Saturation (Exit) 98 %    Rating of Perceived Exertion (Exercise) 15    Perceived Dyspnea (Exercise) 3    Symptoms SOB    Duration Progress to 30 minutes of  aerobic without signs/symptoms of physical distress    Intensity THRR unchanged      Progression   Progression Continue to progress workloads to maintain intensity without signs/symptoms of physical distress.    Average METs 1.88      Resistance Training   Training Prescription Yes    Weight 3 lb    Reps 10-15      Interval Training   Interval Training No      Treadmill   MPH 0.6    Grade 0.5    Minutes 15    METs 1.5      Recumbant Bike   Level 1    Watts 10    Minutes 15      NuStep   Level 2    Minutes 15    METs 2.1      T5 Nustep   Level 1    Minutes 15    METs 1.9      Track   Laps 19    Minutes 15    METs 2.03      Oxygen   Maintain Oxygen Saturation 88% or higher             Nutrition:  Target Goals: Understanding of nutrition guidelines, daily intake of sodium <1585m, cholesterol <2023m calories 30% from fat and 7% or less from saturated fats, daily to have 5 or more servings of fruits and vegetables.  Education: All About Nutrition: -Group instruction provided by verbal, written material, interactive activities, discussions, models, and posters to present general guidelines for heart healthy nutrition including fat, fiber, MyPlate, the role of sodium in heart healthy nutrition, utilization of the nutrition label, and utilization of this knowledge for meal planning. Follow up email sent as well. Written material given at graduation. Flowsheet [facility]  Date [facility]/05/22  [facility]  Review Code 1- Verbalizes Understanding       Biometrics:  Pre Biometrics - 12/15/21 1501       Pre Biometrics   Height _0  (1.626  m)    Weight 174 lb 4.8 oz (79.1 kg)    Waist Circumference 44.5 inches    Hip Circumference 42.5 inches    Waist to Hip Ratio 1.05 %    BMI (Calculated) 29.9    Single Leg Stand 11.9 seconds   R             Nutrition Therapy Plan and Nutrition Goals:  Nutrition Therapy & Goals - 12/15/21 1158       Nutrition Therapy   Diet Heart healthy, low Na, T2DM MNT    Drug/Food Interactions Statins/Certain Fruits    Protein (specify units) 90-100g    Fiber 25 grams    Whole Grain Foods 3 servings    Saturated Fats 14 max. grams    Fruits and Vegetables 8 servings/day    Sodium 2 grams      Personal Nutrition Goals   Nutrition Goal ST: read over materials, practice MyPlate guidelines, include balanced snacks during the day LT: Achieve and maintain A1C <7, eat consistent meals/snacks during the day (about 1-2 servings of CHO per snack and 3-5 servings of CHO per meals), include protein, fiber, and healthy fat at most meals/snacks    Comments 61 y.o. F admitted to pulmonary rehab for sarcoidosis. PMHx includes T2DM, GERD, DDD, HLD, osteoarthritis, former smoker. Relevant medications includes lipitor, vit D3, meclizine, metformin, pantoprazole, tizanidine. Last A1C 09/28/21 was 8.3. Brenda Ellison had previously seen some regarding diabetes in nutrition this past summer, but has not been back. Discussed general heart healthy eating and T2DM MNT include pairing foods, quality and quantity of CHOs, eating consistently, hydration, and exercise. Brenda Ellison feels that she does not eat very consistently right now - discussed some snacks that she could include like an apple with peanut butter.      Intervention Plan   Intervention Prescribe, educate and counsel regarding individualized specific dietary modifications aiming towards targeted core components such as weight,  hypertension, lipid management, diabetes, heart failure and other comorbidities.;Nutrition handout(s) given to patient.    Expected Outcomes Short Term Goal: Understand basic principles of dietary content, such as calories, fat, sodium, cholesterol and nutrients.;Short Term Goal: A plan has been developed with personal nutrition goals set during dietitian appointment.;Long Term Goal: Adherence to prescribed nutrition plan.             Nutrition Assessments:  MEDIFICTS Score Key: ?70 Need to make dietary changes  40-70 Heart Healthy Diet ? 40 Therapeutic Level Cholesterol Diet  Flowsheet Row Pulmonary Rehab from 12/15/2021 in [facility] Cardiac and Pulmonary Rehab  Picture Your Plate Total Score on Admission 71      Picture Your Plate Scores: <82 Unhealthy dietary pattern with much room for improvement. 41-50 Dietary pattern unlikely to meet recommendations for good health and room for improvement. 51-60 More healthful dietary pattern, with some room for improvement.  >60 Healthy dietary pattern, although there may be some specific behaviors that could be improved.   Nutrition Goals Re-Evaluation:  Nutrition Goals Re-Evaluation     [place] Name 01/05/22 1009             Goals   Nutrition Goal ST: read over materials, practice MyPlate guidelines, include balanced snacks during the day LT: Achieve and maintain A1C <7, eat consistent meals/snacks during the day (about 1-2 servings of CHO per snack and 3-5 servings of CHO per meals), include protein, fiber, and healthy fat at most meals/snacks       Comment Brenda Ellison reports doing well with her nutrition;  she reports no changes since the last time we spoke. Encouraged to focus on the short term goals we addressed when we first spoke. Reviewed general heart healthy eating.       Expected Outcome ST: read over materials, practice MyPlate guidelines, include balanced snacks during the day LT: Achieve and maintain A1C <7, eat consistent meals/snacks  during the day (about 1-2 servings of CHO per snack and 3-5 servings of CHO per meals), include protein, fiber, and healthy fat at most meals/snacks                Nutrition Goals Discharge (Final Nutrition Goals Re-Evaluation):  Nutrition Goals Re-Evaluation - 01/05/22 1009       Goals   Nutrition Goal ST: read over materials, practice MyPlate guidelines, include balanced snacks during the day LT: Achieve and maintain A1C <7, eat consistent meals/snacks during the day (about 1-2 servings of CHO per snack and 3-5 servings of CHO per meals), include protein, fiber, and healthy fat at most meals/snacks    Comment Brenda Ellison reports doing well with her nutrition; she reports no changes since the last time we spoke. Encouraged to focus on the short term goals we addressed when we first spoke. Reviewed general heart healthy eating.    Expected Outcome ST: read over materials, practice MyPlate guidelines, include balanced snacks during the day LT: Achieve and maintain A1C <7, eat consistent meals/snacks during the day (about 1-2 servings of CHO per snack and 3-5 servings of CHO per meals), include protein, fiber, and healthy fat at most meals/snacks             Psychosocial: Target Goals: Acknowledge presence or absence of significant depression and/or stress, maximize coping skills, provide positive support system. Participant is able to verbalize types and ability to use techniques and skills needed for reducing stress and depression.   Education: Stress, Anxiety, and Depression - Group verbal and visual presentation to define topics covered.  Reviews how body is impacted by stress, anxiety, and depression.  Also discusses healthy ways to reduce stress and to treat/manage anxiety and depression.  Written material given at graduation.   Education: Sleep Hygiene -Provides group verbal and written instruction about how sleep can affect your health.  Define sleep hygiene, discuss sleep cycles and  impact of sleep habits. Review good sleep hygiene tips.    Initial Review & Psychosocial Screening:  Initial Psych Review & Screening - 12/12/21 1119       Initial Review   Current issues with None Identified      Family Dynamics   Good Support System? Yes    Comments Carollynn can look to her sister inlaw and sons for support. She has church family and pastor for support as well. The normal stresses of daily life are her only stressors.      Barriers   Psychosocial barriers to participate in program The patient should benefit from training in stress management and relaxation.      Screening Interventions   Interventions Encouraged to exercise;To provide support and resources with identified psychosocial needs;Provide feedback about the scores to participant    Expected Outcomes Short Term goal: Utilizing psychosocial counselor, staff and physician to assist with identification of specific Stressors or current issues interfering with healing process. Setting desired goal for each stressor or current issue identified.;Long Term Goal: Stressors or current issues are controlled or eliminated.;Short Term goal: Identification and review with participant of any Quality of Life or Depression concerns found by scoring the  questionnaire.;Long Term goal: The participant improves quality of Life and PHQ9 Scores as seen by post scores and/or verbalization of changes             Quality of Life Scores:  Scores of 19 and below usually indicate a poorer quality of life in these areas.  A difference of  2-3 points is a clinically meaningful difference.  A difference of 2-3 points in the total score of the Quality of Life Index has been associated with significant improvement in overall quality of life, self-image, physical symptoms, and general health in studies assessing change in quality of life.  PHQ-9: Review Flowsheet  More data exists      12/15/2021 12/01/2021 10/17/2021 09/28/2021 09/19/2021   Depression screen PHQ 2/9  Decreased Interest 3 0 0 0 0  Down, Depressed, Hopeless 0 0 0 0 0  PHQ - 2 Score 3 0 0 0 0  Altered sleeping 3 0 - 0 -  Tired, decreased energy 3 0 - 0 -  Change in appetite 3 0 - 0 -  Feeling bad or failure about yourself  0 0 - 0 -  Trouble concentrating 0 0 - 0 -  Moving slowly or fidgety/restless 3 0 - 0 -  Suicidal thoughts 0 0 - 0 -  PHQ-9 Score 15 0 - 0 -  Difficult doing work/chores Not difficult at all Not difficult at all - Not difficult at all -   Interpretation of Total Score  Total Score Depression Severity:  1-4 = Minimal depression, 5-9 = Mild depression, 10-14 = Moderate depression, 15-19 = Moderately severe depression, 20-27 = Severe depression   Psychosocial Evaluation and Intervention:  Psychosocial Evaluation - 12/12/21 1122       Psychosocial Evaluation & Interventions   Interventions Encouraged to exercise with the program and follow exercise prescription;Relaxation education;Stress management education    Comments Brenda Ellison can look to her sister inlaw and sons for support. She has church family and pastor for support as well. The normal stresses of daily life are her only stressors.    Expected Outcomes Short: Start [facility] to help with mood. Long: Maintain a healthy mental state.    Continue Psychosocial Services  Follow up required by staff             Psychosocial Re-Evaluation:  Psychosocial Re-Evaluation     [place] Name 01/05/22 1001             Psychosocial Re-Evaluation   Current issues with Current Stress Concerns       Comments Brenda Ellison reports moving next Monday, she reports no other stressors at this time. She reports that she will sit down and take a minute to breathe to help with stress. Brenda Ellison reports sleeping well.       Expected Outcomes ST: continue to exercise for mental health boost LT: maintain posisitve attitude       Continue Psychosocial Services  Follow up required by staff                 Psychosocial Discharge (Final Psychosocial Re-Evaluation):  Psychosocial Re-Evaluation - 01/05/22 1001       Psychosocial Re-Evaluation   Current issues with Current Stress Concerns    Comments Brenda Ellison reports moving next Monday, she reports no other stressors at this time. She reports that she will sit down and take a minute to breathe to help with stress. Brenda Ellison reports sleeping well.    Expected Outcomes ST: continue to exercise for mental health  boost LT: maintain posisitve attitude    Continue Psychosocial Services  Follow up required by staff             Education: Education Goals: Education classes will be provided on a weekly basis, covering required topics. Participant will state understanding/return demonstration of topics presented.  Learning Barriers/Preferences:  Learning Barriers/Preferences - 12/12/21 1118       Learning Barriers/Preferences   Learning Barriers None    Learning Preferences None             General Pulmonary Education Topics:  Infection Prevention: - Provides verbal and written material to individual with discussion of infection control including proper hand washing and proper equipment cleaning during exercise session. Flowsheet Row Pulmonary Rehab from 01/05/2022 in [facility] Cardiac and Pulmonary Rehab  Date 12/12/21  Educator [facility]  Instruction Review Code 1- Verbalizes Understanding       Falls Prevention: - Provides verbal and written material to individual with discussion of falls prevention and safety. Flowsheet Row Pulmonary Rehab from 01/05/2022 in [facility] Cardiac and Pulmonary Rehab  Date 12/12/21  Educator [facility]  Instruction Review Code 1- Verbalizes Understanding       Chronic Lung Disease Review: - Group verbal instruction with posters, models, PowerPoint presentations and videos,  to review new updates, new respiratory medications, new advancements in procedures and treatments. Providing information on websites and "800" numbers  for continued self-education. Includes information about supplement oxygen, available portable oxygen systems, continuous and intermittent flow rates, oxygen safety, concentrators, and Medicare reimbursement for oxygen. Explanation of Pulmonary Drugs, including class, frequency, complications, importance of spacers, rinsing mouth after steroid MDI's, and proper cleaning methods for nebulizers. Review of basic lung anatomy and physiology related to function, structure, and complications of lung disease. Review of risk factors. Discussion about methods for diagnosing sleep apnea and types of masks and machines for OSA. Includes a review of the use of types of environmental controls: home humidity, furnaces, filters, dust mite/pet prevention, HEPA vacuums. Discussion about weather changes, air quality and the benefits of nasal washing. Instruction on Warning signs, infection symptoms, calling MD promptly, preventive modes, and value of vaccinations. Review of effective airway clearance, coughing and/or vibration techniques. Emphasizing that all should Create an Action Plan. Written material given at graduation. Flowsheet Row Pulmonary Rehab from 01/05/2022 in [facility] Cardiac and Pulmonary Rehab  Education need identified 12/15/21       AED/CPR: - Group verbal and written instruction with the use of models to demonstrate the basic use of the AED with the basic ABC's of resuscitation.    Anatomy and Cardiac Procedures: - Group verbal and visual presentation and models provide information about basic cardiac anatomy and function. Reviews the testing methods done to diagnose heart disease and the outcomes of the test results. Describes the treatment choices: Medical Management, Angioplasty, or Coronary Bypass Surgery for treating various heart conditions including Myocardial Infarction, Angina, Valve Disease, and Cardiac Arrhythmias.  Written material given at graduation.   Medication Safety: - Group verbal  and visual instruction to review commonly prescribed medications for heart and lung disease. Reviews the medication, class of the drug, and side effects. Includes the steps to properly store meds and maintain the prescription regimen.  Written material given at graduation.   Other: -Provides group and verbal instruction on various topics (see comments)   Knowledge Questionnaire Score:  Knowledge Questionnaire Score - 12/15/21 1422       Knowledge Questionnaire Score   Pre Score 14/18  Core Components/Risk Factors/Patient Goals at Admission:  Personal Goals and Risk Factors at Admission - 12/15/21 1427       Core Components/Risk Factors/Patient Goals on Admission    Weight Management Yes;Weight Loss    Intervention Weight Management: Develop a combined nutrition and exercise program designed to reach desired caloric intake, while maintaining appropriate intake of nutrient and fiber, sodium and fats, and appropriate energy expenditure required for the weight goal.;Weight Management: Provide education and appropriate resources to help participant work on and attain dietary goals.;Weight Management/Obesity: Establish reasonable short term and long term weight goals.    Admit Weight 174 lb 4.8 oz (79.1 kg)    Goal Weight: Short Term 170 lb (77.1 kg)    Goal Weight: Long Term 164 lb (74.4 kg)    Expected Outcomes Short Term: Continue to assess and modify interventions until short term weight is achieved;Long Term: Adherence to nutrition and physical activity/exercise program aimed toward attainment of established weight goal;Weight Loss: Understanding of general recommendations for a balanced deficit meal plan, which promotes 1-2 lb weight loss per week and includes a negative energy balance of [phone] kcal/d;Understanding recommendations for meals to include 15-35% energy as protein, 25-35% energy from fat, 35-60% energy from carbohydrates, less than 22m of dietary  cholesterol, 20-35 gm of total fiber daily;Understanding of distribution of calorie intake throughout the day with the consumption of 4-5 meals/snacks    Improve shortness of breath with ADL's Yes    Intervention Provide education, individualized exercise plan and daily activity instruction to help decrease symptoms of SOB with activities of daily living.    Expected Outcomes Short Term: Improve cardiorespiratory fitness to achieve a reduction of symptoms when performing ADLs;Long Term: Be able to perform more ADLs without symptoms or delay the onset of symptoms    Diabetes Yes    Intervention Provide education about signs/symptoms and action to take for hypo/hyperglycemia.;Provide education about proper nutrition, including hydration, and aerobic/resistive exercise prescription along with prescribed medications to achieve blood glucose in normal ranges: Fasting glucose 65-99 mg/dL    Expected Outcomes Short Term: Participant verbalizes understanding of the signs/symptoms and immediate care of hyper/hypoglycemia, proper foot care and importance of medication, aerobic/resistive exercise and nutrition plan for blood glucose control.;Long Term: Attainment of HbA1C < 7%.             Education:Diabetes - Individual verbal and written instruction to review signs/symptoms of diabetes, desired ranges of glucose level fasting, after meals and with exercise. Acknowledge that pre and post exercise glucose checks will be done for 3 sessions at entry of program. [facility]  Date 12/12/21  Educator [facility] Instruction Review Code 1- Verbalizes Understanding       Know Your Numbers and Heart Failure: - Group verbal and visual instruction to discuss disease risk factors for cardiac and pulmonary disease and treatment options.  Reviews associated critical values for Overweight/Obesity, Hypertension, Cholesterol, and Diabetes.  Discusses  basics of heart failure: signs/symptoms and treatments.  Introduces Heart Failure Zone chart for action plan for heart failure.  Written material given at graduation.   Core Components/Risk Factors/Patient Goals Review:   Goals and Risk Factor Review     Row Name 01/05/22 1004             Core Components/Risk Factors/Patient Goals Review   Personal Goals Review Diabetes;Improve shortness of breath with ADL's;Weight Management/Obesity       Review MEvolettrecently started  rehab and reports doing well. She reports her shortness of breath will get worse by "doing to much" - she feels this more right now as she is moving next Monday. She does not check her O2 at home - encouraged to use a pulse oximeter. Brenda Ellison reports that she uses PLB and feels this helps. She reports her BG has been running around 100 at home in the am. She reports not taking her metformin this moring, but usually doing well with taking her medications as prescribed with no issues. Her weight has been stable since starting rehab.       Expected Outcomes ST: consider getting a pulse oximeter to check O2 at home LT: manage risk factors                Core Components/Risk Factors/Patient Goals at Discharge (Final Review):   Goals and Risk Factor Review - 01/05/22 1004       Core Components/Risk Factors/Patient Goals Review   Personal Goals Review Diabetes;Improve shortness of breath with ADL's;Weight Management/Obesity    Review Wai recently started rehab and reports doing well. She reports her shortness of breath will get worse by "doing to much" - she feels this more right now as she is moving next Monday. She does not check her O2 at home - encouraged to use a pulse oximeter. Estefanie reports that she uses PLB and feels this helps. She reports her BG has been running around 100 at home in the am. She reports not taking her metformin this moring, but usually doing well with taking her medications as prescribed with no issues. Her  weight has been stable since starting rehab.    Expected Outcomes ST: consider getting a pulse oximeter to check O2 at home LT: manage risk factors             ITP Comments:  ITP Comments     Row Name 12/12/21 1116 12/15/21 1421 12/21/21 0852 12/27/21 1039 01/18/22 0734   ITP Comments Virtual Visit completed. Patient informed on EP and RD appointment and 6 Minute walk test. Patient also informed of patient health questionnaires on My Chart. Patient Verbalizes understanding. Visit diagnosis can be found in [facility]: Lambert Rhonda W 11/09/2021. Completed 6MWT and gym orientation. Initial ITP created and sent for review to Dr Ottie Glazier, Medical Director. 30 Day review completed. Medical Director ITP review done, changes made as directed, and signed approval by Medical Director.    NEW TO PROGRAM First full day of exercise!  Patient was oriented to gym and equipment including functions, settings, policies, and procedures.  Patient's individual exercise prescription and treatment plan were reviewed.  All starting workloads were established based on the results of the 6 minute walk test done at initial orientation visit.  The plan for exercise progression was also introduced and progression will be customized based on patient's performance and goals. 30 Day review completed. Medical Director ITP review done, changes made as directed, and signed approval by Medical Director.   New to program            Comments:

## 2022-01-19 ENCOUNTER — Encounter: Payer: Medicaid Other | Admitting: *Deleted

## 2022-01-19 ENCOUNTER — Ambulatory Visit: Payer: BLUE CROSS/BLUE SHIELD | Admitting: Podiatry

## 2022-01-19 DIAGNOSIS — D869 Sarcoidosis, unspecified: Secondary | ICD-10-CM

## 2022-01-19 NOTE — Progress Notes (Signed)
Daily Session Note  Patient Details  Name: Brenda Ellison MRN: [id number] Date of Birth: [birth year] Referring Provider:   Flowsheet Row Pulmonary Rehab from 12/15/2021 in [facility] Cardiac and Pulmonary Rehab  Referring Provider Thad Ranger, MD       Encounter Date: 01/19/2022  Check In:  Session Check In - 01/19/22 1041       Check-In   Supervising physician immediately available to respond to emergencies See telemetry face sheet for immediately available ER MD    Location [facility]-Cardiac & Pulmonary Rehab    Staff Present Heath Lark, RN, BSN, CCRP;Noah Tickle, BS, Exercise Physiologist;Melissa [place], RDN, LDN;Jessica [place], MA, RCEP, CCRP, CCET    Virtual Visit No    Medication changes reported     No    Fall or balance concerns reported    No    Warm-up and Cool-down Performed on first and last piece of equipment    Resistance Training Performed Yes    VAD Patient? No    PAD/SET Patient? No      Pain Assessment   Currently in Pain? No/denies                Social History   Tobacco Use  Smoking Status Former   Packs/day: 1.00   Years: 22.00   Total pack years: 22.00   Types: Cigarettes   Start date: 02/21/1988   Quit date: 02/20/2010   Years since quitting: 11.9  Smokeless Tobacco Never    Goals Met:  Proper associated with RPD/PD & O2 Sat [place] with exercise equipment Exercise tolerated well No report of concerns or symptoms today  Goals Unmet:  Not Applicable  Comments: Pt able to follow exercise prescription today without complaint.  Will continue to monitor for progression.    Dr. Emily Filbert is Medical Director for [place].  Dr. Ottie Glazier is Medical Director for [facility] Pulmonary Rehabilitation.

## 2022-01-23 ENCOUNTER — Encounter: Payer: Self-pay | Admitting: Physician Assistant

## 2022-01-23 ENCOUNTER — Ambulatory Visit: Payer: Medicaid Other | Admitting: Physician Assistant

## 2022-01-23 VITALS — BP 118/80 | HR 87 | Temp 97.9°F | Resp 16 | Ht 64.0 in | Wt 173.2 lb

## 2022-01-23 DIAGNOSIS — E119 Type 2 diabetes mellitus without complications: Secondary | ICD-10-CM

## 2022-01-23 NOTE — Patient Instructions (Addendum)
Please take Metformin 500 mg by mouth twice per day with a meal   We will see you back in Feb to recheck your A1c and see if we need to add another medication

## 2022-01-23 NOTE — Progress Notes (Signed)
Established Patient Office Visit  Name: Brenda Ellison   MRN: [id number]    DOB: August 19, 1960   Date:01/23/2022  Today's Provider: Talitha Givens, MHS, PA-C Introduced myself to the patient as a PA-C and provided education on APPs in clinical practice.         Subjective  Chief Complaint  Chief Complaint  Patient presents with   Follow-up   Diabetes    HPI   Diabetes, Type 2 - Last A1c  6.1 - Medications: Metformin 500 mg PO BID,  Farxiga 5 mg PO QD - reports she was not able to start this due to cost  - Compliance: Good  - Checking BG at home: She checks her BG every morning 120s-130s - Diet: Tries to monitor her sugar intake, states she drinks plenty of water and chooses sugar free options often  - Exercise: She is not regularly exercising - wants to start walking more  - Eye exam: She is aware of the importance of yearly eye exams - states she was not able to complete due to unpaid balance right now  - Foot exam: UTD - Microalbumin: UTD - Statin: on statin therapy  Denies upset stomach concerns right now  - Denies symptoms of hypoglycemia, polyuria, polydipsia, numbness extremities, foot ulcers/trauma     Patient Active Problem List   Diagnosis Date Noted   Type 2 diabetes mellitus without complication, without long-term current use of insulin ([place]) 06/30/2021   Sarcoidosis of lung ([place]) 06/06/2021   Pelvic pain 11/29/2020   Gastroesophageal reflux disease 03/18/2020   New onset type 2 diabetes mellitus ([place]) 02/19/2020   Allergic rhinitis 02/17/2020   Thoracic aortic aneurysm without rupture ([place]) 09/23/2019   Small airways disease 05/13/2019   Class 1 obesity with serious comorbidity and body mass index (BMI) of 31.0 to 31.9 in adult 05/13/2019   Family history of early CAD 05/13/2019   Lung nodule 04/01/2019   Wheeze 04/01/2019   Exertional shortness of breath 04/01/2019   Mediastinal lymphadenopathy 04/01/2019   Hilar lymphadenopathy 04/01/2019    Cervical radiculopathy 10/25/2018   Stable angina pectoris 09/30/2018   Overweight (BMI 25.0-29.9) 04/23/2018   DDD (degenerative disc disease), lumbar 11/06/2017   Cardiac murmur 03/16/2017   Lumbar radiculopathy 03/16/2017   Osteoarthritis 06/07/2015   Hyperlipidemia 06/01/2015   Seasonal allergies 01/12/2015   Urticaria 11/30/2014   Degenerative arthritis of hip 10/19/2014   Arthritis of hand, degenerative 10/05/2014   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   Moderate mitral insufficiency 05/01/2014    Past Surgical History:  Procedure Laterality Date   COLONOSCOPY WITH PROPOFOL N/A 05/15/2017   Procedure: COLONOSCOPY WITH PROPOFOL;  Surgeon: Jonathon Bellows, MD;  Location: [facility] ENDOSCOPY;  Service: Gastroenterology;  Laterality: N/A;   HAND RECONSTRUCTION Bilateral    as a child    Family History  Problem Relation Age of Onset   Diabetes Brother    Heart attack Mother    Breast cancer Neg Hx    Ovarian cancer Neg Hx    Colon cancer Neg Hx    Heart disease Neg Hx     Social History   Tobacco Use   Smoking status: Former    Packs/day: 1.00    Years: 22.00    Total pack years: 22.00    Types: Cigarettes    Start date: 02/21/1988    Quit date: 02/20/2010    Years since quitting: 11.9   Smokeless tobacco: Never  Substance Use Topics  Alcohol use: No     Current Outpatient Medications:    albuterol (VENTOLIN HFA) 108 (90 Base) MCG/ACT inhaler, Inhale 2 puffs into the lungs every 4 (four) hours as needed for wheezing or shortness of breath., Disp: 18 g, Rfl: 3   albuterol (VENTOLIN HFA) 108 (90 Base) MCG/ACT inhaler, , Disp: , Rfl:    atorvastatin (LIPITOR) 20 MG tablet, TAKE 1 TABLET BY MOUTH AT BEDTIME, Disp: 30 tablet, Rfl: 8   benzonatate (TESSALON) 100 MG capsule, Take 1-2 capsules (100-200 mg total) by mouth 3 (three) times daily as needed for cough., Disp: 30 capsule, Rfl: 1   blood glucose meter kit and supplies, Dispense based on patient and insurance preference. Use up to  four times daily as directed. (FOR ICD-10 E10.9, E11.9)., Disp: 1 each, Rfl: 0   budesonide-formoterol (SYMBICORT) 160-4.5 MCG/ACT inhaler, Inhale 2 puffs into the lungs 2 (two) times daily., Disp: 1 each, Rfl: 0   celecoxib (CELEBREX) 100 MG capsule, Take 1 capsule by mouth 2 (two) times daily., Disp: , Rfl:    cetirizine (ZYRTEC ALLERGY) 10 MG tablet, 1 tablet Orally Once a day for 30 day(s), Disp: , Rfl:    Cholecalciferol (VITAMIN D3) 2000 UNITS capsule, Take 2,000 Units by mouth daily., Disp: , Rfl:    fluticasone (FLONASE) 50 MCG/ACT nasal spray, Place 2 sprays into both nostrils daily., Disp: 16 g, Rfl: 6   gabapentin (NEURONTIN) 100 MG capsule, Take 100 mg by mouth 2 (two) times daily., Disp: , Rfl:    levocetirizine (XYZAL) 5 MG tablet, Take 1 tablet (5 mg total) by mouth every evening., Disp: 90 tablet, Rfl: 1   meclizine (ANTIVERT) 12.5 MG tablet, Take 1 tablet (12.5 mg total) by mouth 3 (three) times daily as needed for dizziness., Disp: 30 tablet, Rfl: 0   metFORMIN (GLUCOPHAGE) 500 MG tablet, Take 500 mg by mouth 2 (two) times daily with a meal., Disp: , Rfl:    montelukast (SINGULAIR) 10 MG tablet, Take 1 tablet (10 mg total) by mouth at bedtime., Disp: 30 tablet, Rfl: 3   pantoprazole (PROTONIX) 20 MG tablet, Take 20 mg by mouth daily., Disp: , Rfl:    promethazine-dextromethorphan (PROMETHAZINE-DM) 6.25-15 MG/5ML syrup, Take 2.5-5 mLs by mouth 3 (three) times daily as needed for cough., Disp: 118 mL, Rfl: 1   tiZANidine (ZANAFLEX) 4 MG tablet, Take 1 tablet (4 mg total) by mouth every 8 (eight) hours as needed for muscle spasms (muscle tightness)., Disp: 90 tablet, Rfl: 2   dapagliflozin propanediol (FARXIGA) 5 MG TABS tablet, Take 1 tablet (5 mg total) by mouth daily before breakfast. (Patient not taking: Reported on 01/23/2022), Disp: 30 tablet, Rfl: 5  Allergies  Allergen Reactions   Tramadol Rash   Tramadol Hcl Rash    I personally reviewed active problem list, medication  list, allergies, health maintenance, notes from last encounter, lab results with the patient/caregiver today.   Review of Systems  Respiratory:  Positive for shortness of breath.   Genitourinary:  Negative for frequency.  Musculoskeletal:  Negative for falls.  Neurological:  Positive for tingling (at the tips of her toes). Negative for dizziness, tremors, loss of consciousness, weakness and headaches.  Endo/Heme/Allergies:  Negative for polydipsia.      Objective  Vitals:   01/23/22 1322  BP: 118/80  Pulse: 87  Resp: 16  Temp: 97.9 F (36.6 C)  TempSrc: Oral  SpO2: 97%  Weight: 173 lb 3.2 oz (78.6 kg)  Height: _0  (1.626 m)  Body mass index is 29.73 kg/m.  Physical Exam Vitals reviewed.  Constitutional:      General: She is awake.     Appearance: Normal appearance. She is well-developed and well-groomed.  HENT:     Head: Normocephalic and atraumatic.  Pulmonary:     Effort: Pulmonary effort is normal.  Neurological:     General: No focal deficit present.     Mental Status: She is alert and oriented to person, place, and time.     GCS: GCS eye subscore is 4. GCS verbal subscore is 5. GCS motor subscore is 6.  Psychiatric:        Attention and Perception: Attention normal.        Mood and Affect: Mood normal.        Speech: Speech is rapid and pressured.        Behavior: Behavior normal. Behavior is cooperative.        Thought Content: Thought content normal.        Judgment: Judgment normal.      Recent Results (from the past 2160 hour(s))  POCT HgB A1C     Status: Abnormal   Collection Time: 12/26/21  1:58 PM  Result Value Ref Range   Hemoglobin A1C 6.1 (A) 4.0 - 5.6 %   HbA1c POC (<> result, manual entry)     HbA1c, POC (prediabetic range)     HbA1c, POC (controlled diabetic range)    Basic Metabolic Panel (BMET)     Status: Abnormal   Collection Time: 12/26/21  3:18 PM  Result Value Ref Range   Glucose, Bld 92 65 - 99 mg/dL    Comment: .             Fasting reference interval .    BUN 19 7 - 25 mg/dL   Creat 1.36 (H) 0.50 - 1.03 mg/dL   BUN/Creatinine Ratio 14 6 - 22 (calc)   Sodium 143 135 - 146 mmol/L   Potassium 4.1 3.5 - 5.3 mmol/L   Chloride 105 98 - 110 mmol/L   CO2 30 20 - 32 mmol/L   Calcium 9.9 8.6 - 10.4 mg/dL  Glucose, capillary     Status: Abnormal   Collection Time: 12/27/21  9:56 AM  Result Value Ref Range   Glucose-Capillary 145 (H) 70 - 99 mg/dL    Comment: Glucose reference range applies only to samples taken after fasting for at least 8 hours.  Glucose, capillary     Status: Abnormal   Collection Time: 12/27/21 10:49 AM  Result Value Ref Range   Glucose-Capillary 118 (H) 70 - 99 mg/dL    Comment: Glucose reference range applies only to samples taken after fasting for at least 8 hours.  Glucose, capillary     Status: Abnormal   Collection Time: 12/29/21  9:43 AM  Result Value Ref Range   Glucose-Capillary 110 (H) 70 - 99 mg/dL    Comment: Glucose reference range applies only to samples taken after fasting for at least 8 hours.  Glucose, capillary     Status: None   Collection Time: 12/29/21 10:48 AM  Result Value Ref Range   Glucose-Capillary 80 70 - 99 mg/dL    Comment: Glucose reference range applies only to samples taken after fasting for at least 8 hours.  Glucose, capillary     Status: Abnormal   Collection Time: 01/03/22  9:55 AM  Result Value Ref Range   Glucose-Capillary 148 (H) 70 - 99 mg/dL  Comment: Glucose reference range applies only to samples taken after fasting for at least 8 hours.  Glucose, capillary     Status: Abnormal   Collection Time: 01/03/22 10:51 AM  Result Value Ref Range   Glucose-Capillary 132 (H) 70 - 99 mg/dL    Comment: Glucose reference range applies only to samples taken after fasting for at least 8 hours.     PHQ2/9:    01/23/2022    1:18 PM 01/19/2022    9:53 AM 12/15/2021    2:23 PM 12/01/2021    9:22 AM 10/17/2021    9:44 AM  Depression screen PHQ 2/9   Decreased Interest 0 0 3 0 0  Down, Depressed, Hopeless 0 0 0 0 0  PHQ - 2 Score 0 0 3 0 0  Altered sleeping 0 0 3 0   Tired, decreased energy 0 1 3 0   Change in appetite 0 0 3 0   Feeling bad or failure about yourself  0 0 0 0   Trouble concentrating 0 1 0 0   Moving slowly or fidgety/restless 0 0 3 0   Suicidal thoughts 0 0 0 0   PHQ-9 Score 0 2 15 0   Difficult doing work/chores Not difficult at all  Not difficult at all Not difficult at all       Fall Risk:    01/23/2022    1:18 PM 12/12/2021   11:11 AM 12/01/2021    9:22 AM 10/17/2021    9:44 AM 09/28/2021    2:49 PM  [place] in the past year? 0 0 0 0 0  Number falls in past yr: 0 0 0  0  Injury with Fall? 0 0 0  0  Risk for fall due to : No Fall Risks No Fall Risks No Fall Risks  No Fall Risks  Follow up Falls prevention discussed;Education provided;Falls evaluation completed Falls evaluation completed;Education provided;Falls prevention discussed Falls prevention discussed;Education provided  Falls prevention discussed;Education provided      Functional Status Survey: Is the patient deaf or have difficulty hearing?: No Does the patient have difficulty seeing, even when wearing glasses/contacts?: No Does the patient have difficulty concentrating, remembering, or making decisions?: No Does the patient have difficulty walking or climbing stairs?: No Does the patient have difficulty dressing or bathing?: No Does the patient have difficulty doing errands alone such as visiting a doctor's office or shopping?: No    Assessment & Plan  Problem List Items Addressed This Visit       Endocrine   Type 2 diabetes mellitus without complication, without long-term current use of insulin ([place]) - Primary    Chronic, ongoing condition  She reports she has not picked up [place] or [place] scripts due to cost and has been taking Metformin 500 mg PO BID instead  She states she is not having as bad of GI side effects  with the lower dose of Metformin Most recent A1c was 6.1 - recommend recheck in 2 months for monitoring Continue with Metformin 500 mg PO BID for now Provided cost savings information for Farxiga to see if this will improve her ability to use it going forward Follow up in 2 months for monitoring or sooner if concerns arise.         Relevant Medications   metFORMIN (GLUCOPHAGE) 500 MG tablet     Return in about 2 months (around 03/26/2022) for Diabetes follow up.   I, Tyrez Berrios E Elhadji Pecore, PA-C,  have reviewed all documentation for this visit. The documentation on 01/23/22 for the exam, diagnosis, procedures, and orders are all accurate and complete.   Talitha Givens, MHS, PA-C [facility]

## 2022-01-23 NOTE — Assessment & Plan Note (Signed)
Chronic, ongoing condition  She reports she has not picked up [place] or [place] scripts due to cost and has been taking Metformin 500 mg PO BID instead  She states she is not having as bad of GI side effects with the lower dose of Metformin Most recent A1c was 6.1 - recommend recheck in 2 months for monitoring Continue with Metformin 500 mg PO BID for now Provided cost savings information for Marcelline Deist to see if this will improve her ability to use it going forward Follow up in 2 months for monitoring or sooner if concerns arise.

## 2022-01-24 ENCOUNTER — Encounter: Payer: Medicaid Other | Attending: Family Medicine | Admitting: *Deleted

## 2022-01-24 DIAGNOSIS — Z5189 Encounter for other specified aftercare: Secondary | ICD-10-CM | POA: Insufficient documentation

## 2022-01-24 DIAGNOSIS — D869 Sarcoidosis, unspecified: Secondary | ICD-10-CM | POA: Insufficient documentation

## 2022-01-24 NOTE — Progress Notes (Signed)
Daily Session Note  Patient Details  Name: Brenda Ellison MRN: [id number] Date of Birth: [birth year] Referring Provider:   Flowsheet Row Pulmonary Rehab from 12/15/2021 in [facility] Cardiac and Pulmonary Rehab  Referring Provider Thad Ranger, MD       Encounter Date: 01/24/2022  Check In:  Session Check In - 01/24/22 1001       Check-In   Supervising physician immediately available to respond to emergencies See telemetry face sheet for immediately available ER MD    Location [facility]-Cardiac & Pulmonary Rehab    Staff Present Heath Lark, RN, BSN, CCRP;Meredith Sherryll Burger, RN BSN;Jessica Wynot, MA, RCEP, CCRP, Bertram Gala, MS, ACSM CEP, Exercise Physiologist    Virtual Visit No    Medication changes reported     No    Fall or balance concerns reported    No    Warm-up and Cool-down Performed on first and last piece of equipment    Resistance Training Performed Yes    VAD Patient? No    PAD/SET Patient? No      Pain Assessment   Currently in Pain? No/denies                Social History   Tobacco Use  Smoking Status Former   Packs/day: 1.00   Years: 22.00   Total pack years: 22.00   Types: Cigarettes   Start date: 02/21/1988   Quit date: 02/20/2010   Years since quitting: 11.9  Smokeless Tobacco Never    Goals Met:  Proper associated with RPD/PD & O2 Sat Independence with exercise equipment Exercise tolerated well No report of concerns or symptoms today  Goals Unmet:  Not Applicable  Comments: Pt able to follow exercise prescription today without complaint.  Will continue to monitor for progression.    Dr. Emily Filbert is Medical Director for [place].  Dr. Ottie Glazier is Medical Director for [facility] Pulmonary Rehabilitation.

## 2022-01-26 ENCOUNTER — Ambulatory Visit (INDEPENDENT_AMBULATORY_CARE_PROVIDER_SITE_OTHER): Payer: Medicaid Other | Admitting: Family Medicine

## 2022-01-26 ENCOUNTER — Ambulatory Visit: Payer: BLUE CROSS/BLUE SHIELD | Admitting: Podiatry

## 2022-01-26 ENCOUNTER — Encounter: Payer: Medicaid Other | Admitting: *Deleted

## 2022-01-26 ENCOUNTER — Ambulatory Visit: Payer: Medicaid Other | Admitting: Family Medicine

## 2022-01-26 ENCOUNTER — Encounter: Payer: Self-pay | Admitting: Family Medicine

## 2022-01-26 VITALS — BP 130/80 | HR 70 | Temp 97.6°F | Resp 16 | Ht 64.0 in | Wt 174.0 lb

## 2022-01-26 DIAGNOSIS — D869 Sarcoidosis, unspecified: Secondary | ICD-10-CM

## 2022-01-26 DIAGNOSIS — M549 Dorsalgia, unspecified: Secondary | ICD-10-CM

## 2022-01-26 NOTE — Progress Notes (Signed)
Daily Session Note  Patient Details  Name: Brenda Ellison MRN: [id number] Date of Birth: June 18, 1960 Referring Provider:   Flowsheet Row Pulmonary Rehab from 12/15/2021 in [facility] Cardiac and Pulmonary Rehab  Referring Provider Thad Ranger, MD       Encounter Date: 01/26/2022  Check In:  Session Check In - 01/26/22 1008       Check-In   Supervising physician immediately available to respond to emergencies See telemetry face sheet for immediately available ER MD    Location [facility]-Cardiac & Pulmonary Rehab    Staff Present Renita Papa, RN BSN;Jessica Luan Pulling, MA, RCEP, CCRP, Bertram Gala, MS, ACSM CEP, Exercise Physiologist    Virtual Visit No    Medication changes reported     No    Fall or balance concerns reported    No    Warm-up and Cool-down Performed on first and last piece of equipment    Resistance Training Performed Yes    VAD Patient? No    PAD/SET Patient? No      Pain Assessment   Currently in Pain? No/denies                Social History   Tobacco Use  Smoking Status Former   Packs/day: 1.00   Years: 22.00   Total pack years: 22.00   Types: Cigarettes   Start date: 02/21/1988   Quit date: 02/20/2010   Years since quitting: 11.9  Smokeless Tobacco Never    Goals Met:  Independence with exercise equipment Exercise tolerated well No report of concerns or symptoms today Strength training completed today  Goals Unmet:  Not Applicable  Comments: Pt able to follow exercise prescription today without complaint.  Will continue to monitor for progression.    Dr. Emily Filbert is Medical Director for [place].  Dr. Ottie Glazier is Medical Director for [facility] Pulmonary Rehabilitation.

## 2022-01-27 ENCOUNTER — Ambulatory Visit (INDEPENDENT_AMBULATORY_CARE_PROVIDER_SITE_OTHER): Payer: Medicaid Other | Admitting: Family Medicine

## 2022-01-27 ENCOUNTER — Ambulatory Visit
Admission: RE | Admit: 2022-01-27 | Discharge: 2022-01-27 | Disposition: A | Discharge status: Home / Self Care | Payer: Medicaid Other | Source: Ambulatory Visit | Attending: Family Medicine | Admitting: Family Medicine

## 2022-01-27 ENCOUNTER — Encounter: Payer: Self-pay | Admitting: Family Medicine

## 2022-01-27 ENCOUNTER — Ambulatory Visit
Admission: RE | Admit: 2022-01-27 | Discharge: 2022-01-27 | Disposition: A | Discharge status: Home / Self Care | Payer: Medicaid Other | Attending: Family Medicine | Admitting: Family Medicine

## 2022-01-27 VITALS — BP 130/82 | HR 81 | Temp 98.0°F | Resp 16 | Ht 64.0 in | Wt 174.8 lb

## 2022-01-27 DIAGNOSIS — M545 Low back pain, unspecified: Secondary | ICD-10-CM

## 2022-01-27 DIAGNOSIS — K219 Gastro-esophageal reflux disease without esophagitis: Secondary | ICD-10-CM | POA: Diagnosis not present

## 2022-01-27 DIAGNOSIS — M5136 Other intervertebral disc degeneration, lumbar region: Secondary | ICD-10-CM | POA: Diagnosis not present

## 2022-01-27 DIAGNOSIS — M4126 Other idiopathic scoliosis, lumbar region: Secondary | ICD-10-CM | POA: Diagnosis not present

## 2022-01-27 MED ORDER — PANTOPRAZOLE SODIUM 20 MG PO TBEC: 20.0000 mg | DELAYED_RELEASE_TABLET | Freq: Two times a day (BID) | ORAL | 1 refills | Status: DC

## 2022-01-27 MED ORDER — TIZANIDINE HCL 4 MG PO TABS: 4.0000 mg | ORAL_TABLET | Freq: Three times a day (TID) | ORAL | 1 refills | Status: DC | PRN

## 2022-01-27 NOTE — Patient Instructions (Signed)
Heat Therapy Heat therapy can help ease sore, stiff, injured, and tight muscles and joints. Heat relaxes your muscles. This may help ease your pain and muscle spasms. What are the risks? If you have any of the following conditions, do not use heat therapy unless your doctor says it is okay. These conditions include: New bruises. Open wounds. Any of these in the area being treated: Healing wounds. Infected skin. Scarred skin. Problems with how blood moves through your body (circulation). Loss of feeling (numbness) in the part of your body that is being treated. Unusual swelling of the part of the body that is being treated. Blood clots. Diabetes. Heart disease. Cancer. Not being able to communicate pain. This may include young children and people who have problems with their brain function (dementia). How to use heat therapy There are different kinds of heat therapy. These include: Moist heat pack. Hot water bottle. Electric heating pad. Heated gel pack. Heated wrap. Warm water bath. Your doctor will tell you how to use heat therapy. In general, you should: Place a towel between your skin and the heat source. Leave the heat on for 20-30 minutes. Your skin may turn pink. Take off the heat if your skin turns bright red. This is very important. If you cannot feel pain, heat, or cold, you have a greater risk of getting burned. Your doctor may also tell you to take a warm water bath. To do this: Put a non-slip pad in the bathtub to prevent a fall. Fill the bathtub with warm water. Check the water temperature. Soak in the water for 15-20 minutes, or as told by your doctor. Be careful when you stand up after the bath. You may feel dizzy. Pat yourself dry after the bath. Do not rub your skin to dry it. General recommendations for heat therapy Be careful not to burn your skin when using heat therapy. High heat or using heat for a long time can cause burns. Do not sleep while using heat  therapy. Only use heat therapy while you are awake. Check your skin during heat therapy. Do not use heat therapy if you have a new injury, especially if you have swelling on the injured area. Do not use heat therapy on areas of your skin that are already irritated, such as with a rash or sunburn. Do not use heat therapy if your skin turns bright red. Contact a doctor if: You have blisters, redness, swelling, or loss of feeling in the area where you use heat therapy. You have new pain. You have pain that gets worse. Summary Heat therapy is the use of heat to help ease sore, stiff, injured, and tight muscles and joints. There are different types of heat therapy. Your doctor will tell you which one to use. Only use heat therapy while you are awake. Watch your skin to make sure you do not get burned while using heat therapy. This information is not intended to replace advice given to you by your health care provider. Make sure you discuss any questions you have with your health care provider. Document Revised: 12/10/2019 Document Reviewed: 12/10/2019 Elsevier Patient Education  2023 [employer] Inc[employer]   Chronic Back Pain When back pain lasts longer than 3 months, it is called chronic back pain. Pain may get worse at certain times (flare-ups). There are things you can do at home to manage your pain. Follow these instructions at home: Pay attention to any changes in your symptoms. Take these actions to help with your  pain: Managing pain and stiffness     If told, put ice on the painful area. Your doctor may tell you to use ice for 24-48 hours after the flare-up starts. To do this: Put ice in a plastic bag. Place a towel between your skin and the bag. Leave the ice on for 20 minutes, 2-3 times a day. If told, put heat on the painful area. Do this as often as told by your doctor. Use the heat source that your doctor recommends, such as a moist heat pack or a heating pad. Place a towel between  your skin and the heat source. Leave the heat on for 20-30 minutes. Take off the heat if your skin turns bright red. This is especially important if you are unable to feel pain, heat, or cold. You may have a greater risk of getting burned. Soak in a warm bath. This can help relieve pain. Activity  Avoid bending and other activities that make pain worse. When standing: Keep your upper back and neck straight. Keep your shoulders pulled back. Avoid slouching. When sitting: Keep your back straight. Relax your shoulders. Do not round your shoulders or pull them backward. Do not sit or stand in one place for long periods of time. Take short rest breaks during the day. Lying down or standing is usually better than sitting. Resting can help relieve pain. When sitting or lying down for a long time, do some mild activity or stretching. This will help to prevent stiffness and pain. Get regular exercise. Ask your doctor what activities are safe for you. Do not lift anything that is heavier than 10 lb (4.5 kg) or the limit that you are told, until your doctor says that it is safe. To prevent injury when you lift things: Bend your knees. Keep the weight close to your body. Avoid twisting. Sleep on a firm mattress. Try lying on your side with your knees slightly bent. If you lie on your back, put a pillow under your knees. Medicines Treatment may include medicines for pain and swelling taken by mouth or put on the skin, prescription pain medicine, or muscle relaxants. Take over-the-counter and prescription medicines only as told by your doctor. Ask your doctor if the medicine prescribed to you: Requires you to avoid driving or using machinery. Can cause trouble pooping (constipation). You may need to take these actions to prevent or treat trouble pooping: Drink enough fluid to keep your pee (urine) pale yellow. Take over-the-counter or prescription medicines. Eat foods that are high in fiber. These  include beans, whole grains, and fresh fruits and vegetables. Limit foods that are high in fat and sugars. These include fried or sweet foods. General instructions Do not use any products that contain nicotine or tobacco, such as cigarettes, e-cigarettes, and chewing tobacco. If you need help quitting, ask your doctor. Keep all follow-up visits as told by your doctor. This is important. Contact a doctor if: Your pain does not get better with rest or medicine. Your pain gets worse, or you have new pain. You have a high fever. You lose weight very quickly. You have trouble doing your normal activities. Get help right away if: One or both of your legs or feet feel weak. One or both of your legs or feet lose feeling (have numbness). You have trouble controlling when you poop (have a bowel movement) or pee (urinate). You have bad back pain and: You feel like you may vomit (nauseous), or you vomit. You have pain  in your belly (abdomen). You have shortness of breath. You faint. Summary When back pain lasts longer than 3 months, it is called chronic back pain. Pain may get worse at certain times (flare-ups). Use ice and heat as told by your doctor. Your doctor may tell you to use ice after flare-ups. This information is not intended to replace advice given to you by your health care provider. Make sure you discuss any questions you have with your health care provider. Document Revised: 03/19/2019 Document Reviewed: 03/19/2019 Elsevier Patient Education  2023 [employer].

## 2022-01-27 NOTE — Progress Notes (Unsigned)
Patient ID: Brenda Ellison, female    DOB: 1961/01/17, 61 y.o.   MRN: [id number]  PCP: Delsa Grana, PA-C  Chief Complaint  Patient presents with   Back Pain    Lower right side onset for months pt states it seem also associated with her hip    Subjective:   Brenda Ellison is a 61 y.o. female, presents to clinic with CC of the following:  Back Pain This is a recurrent problem. The current episode started more than 1 month ago. The problem occurs intermittently. The problem has been gradually worsening since onset. The pain is present in the sacro-iliac, lumbar spine and gluteal. The quality of the pain is described as burning, shooting and stabbing. The pain does not radiate. The pain is moderate. Pertinent negatives include no abdominal pain, bladder incontinence, bowel incontinence, chest pain, dysuria, fever, headaches, leg pain, numbness, paresis, paresthesias, pelvic pain, perianal numbness, tingling, weakness or weight loss. Risk factors include obesity, sedentary lifestyle and lack of exercise. She has tried nothing for the symptoms. The treatment provided no relief.      Patient Active Problem List   Diagnosis Date Noted   Type 2 diabetes mellitus without complication, without long-term current use of insulin ([place]) 06/30/2021   Sarcoidosis of lung ([place]) 06/06/2021   Pelvic pain 11/29/2020   Gastroesophageal reflux disease 03/18/2020   New onset type 2 diabetes mellitus ([place]) 02/19/2020   Allergic rhinitis 02/17/2020   Thoracic aortic aneurysm without rupture ([place]) 09/23/2019   Small airways disease 05/13/2019   Class 1 obesity with serious comorbidity and body mass index (BMI) of 31.0 to 31.9 in adult 05/13/2019   Family history of early CAD 05/13/2019   Lung nodule 04/01/2019   Wheeze 04/01/2019   Exertional shortness of breath 04/01/2019   Mediastinal lymphadenopathy 04/01/2019   Hilar lymphadenopathy 04/01/2019   Cervical radiculopathy 10/25/2018   Stable angina  pectoris 09/30/2018   Overweight (BMI 25.0-29.9) 04/23/2018   DDD (degenerative disc disease), lumbar 11/06/2017   Cardiac murmur 03/16/2017   Lumbar radiculopathy 03/16/2017   Osteoarthritis 06/07/2015   Hyperlipidemia 06/01/2015   Seasonal allergies 01/12/2015   Urticaria 11/30/2014   Degenerative arthritis of hip 10/19/2014   Arthritis of hand, degenerative 10/05/2014   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   Moderate mitral insufficiency 05/01/2014      Current Outpatient Medications:    albuterol (VENTOLIN HFA) 108 (90 Base) MCG/ACT inhaler, Inhale 2 puffs into the lungs every 4 (four) hours as needed for wheezing or shortness of breath., Disp: 18 g, Rfl: 3   albuterol (VENTOLIN HFA) 108 (90 Base) MCG/ACT inhaler, , Disp: , Rfl:    atorvastatin (LIPITOR) 20 MG tablet, TAKE 1 TABLET BY MOUTH AT BEDTIME, Disp: 30 tablet, Rfl: 8   benzonatate (TESSALON) 100 MG capsule, Take 1-2 capsules (100-200 mg total) by mouth 3 (three) times daily as needed for cough., Disp: 30 capsule, Rfl: 1   blood glucose meter kit and supplies, Dispense based on patient and insurance preference. Use up to four times daily as directed. (FOR ICD-10 E10.9, E11.9)., Disp: 1 each, Rfl: 0   budesonide-formoterol (SYMBICORT) 160-4.5 MCG/ACT inhaler, Inhale 2 puffs into the lungs 2 (two) times daily., Disp: 1 each, Rfl: 0   celecoxib (CELEBREX) 100 MG capsule, Take 1 capsule by mouth 2 (two) times daily., Disp: , Rfl:    cetirizine (ZYRTEC ALLERGY) 10 MG tablet, 1 tablet Orally Once a day for 30 day(s), Disp: , Rfl:    Cholecalciferol (VITAMIN  D3) 2000 UNITS capsule, Take 2,000 Units by mouth daily., Disp: , Rfl:    dapagliflozin propanediol (FARXIGA) 5 MG TABS tablet, Take 1 tablet (5 mg total) by mouth daily before breakfast., Disp: 30 tablet, Rfl: 5   fluticasone (FLONASE) 50 MCG/ACT nasal spray, Place 2 sprays into both nostrils daily., Disp: 16 g, Rfl: 6   gabapentin (NEURONTIN) 100 MG capsule, Take 100 mg by mouth 2 (two)  times daily., Disp: , Rfl:    levocetirizine (XYZAL) 5 MG tablet, Take 1 tablet (5 mg total) by mouth every evening., Disp: 90 tablet, Rfl: 1   meclizine (ANTIVERT) 12.5 MG tablet, Take 1 tablet (12.5 mg total) by mouth 3 (three) times daily as needed for dizziness., Disp: 30 tablet, Rfl: 0   metFORMIN (GLUCOPHAGE) 500 MG tablet, Take 500 mg by mouth 2 (two) times daily with a meal., Disp: , Rfl:    montelukast (SINGULAIR) 10 MG tablet, Take 1 tablet (10 mg total) by mouth at bedtime., Disp: 30 tablet, Rfl: 3   pantoprazole (PROTONIX) 20 MG tablet, Take 20 mg by mouth daily., Disp: , Rfl:    promethazine-dextromethorphan (PROMETHAZINE-DM) 6.25-15 MG/5ML syrup, Take 2.5-5 mLs by mouth 3 (three) times daily as needed for cough., Disp: 118 mL, Rfl: 1   tiZANidine (ZANAFLEX) 4 MG tablet, Take 1 tablet (4 mg total) by mouth every 8 (eight) hours as needed for muscle spasms (muscle tightness)., Disp: 90 tablet, Rfl: 2   Allergies  Allergen Reactions   Tramadol Rash   Tramadol Hcl Rash     Social History   Tobacco Use   Smoking status: Former    Packs/day: 1.00    Years: 22.00    Total pack years: 22.00    Types: Cigarettes    Start date: 02/21/1988    Quit date: 02/20/2010    Years since quitting: 11.9   Smokeless tobacco: Never  Vaping Use   Vaping Use: Never used  Substance Use Topics   Alcohol use: No   Drug use: No      Chart Review Today: I personally reviewed active problem list, medication list, allergies, family history, social history, health maintenance, notes from last encounter, lab results, imaging with the patient/caregiver today.    Review of Systems  Constitutional: Negative.  Negative for fever and weight loss.  HENT: Negative.    Eyes: Negative.   Respiratory: Negative.    Cardiovascular: Negative.  Negative for chest pain.  Gastrointestinal: Negative.  Negative for abdominal pain and bowel incontinence.  Endocrine: Negative.   Genitourinary: Negative.   Negative for bladder incontinence, dysuria and pelvic pain.  Musculoskeletal:  Positive for back pain.  Skin: Negative.   Allergic/Immunologic: Negative.   Neurological: Negative.  Negative for tingling, weakness, numbness, headaches and paresthesias.  Hematological: Negative.   Psychiatric/Behavioral: Negative.    All other systems reviewed and are negative.      Objective:   Vitals:   01/27/22 0950  BP: 130/82  Pulse: 81  Resp: 16  Temp: 98 F (36.7 C)  TempSrc: Oral  SpO2: 96%  Weight: 174 lb 12.8 oz (79.3 kg)  Height: _0  (1.626 m)    Body mass index is 30 kg/m.  Physical Exam Vitals and nursing note reviewed.  Constitutional:      General: She is not in acute distress.    Appearance: She is well-developed. She is obese. She is not ill-appearing, toxic-appearing or diaphoretic.  HENT:     Head: Normocephalic and atraumatic.  Nose: Nose normal.  Eyes:     General:        Right eye: No discharge.        Left eye: No discharge.     Conjunctiva/sclera: Conjunctivae normal.  Neck:     Trachea: No tracheal deviation.  Cardiovascular:     Rate and Rhythm: Normal rate and regular rhythm.  Pulmonary:     Effort: Pulmonary effort is normal. No respiratory distress.     Breath sounds: No stridor.  Musculoskeletal:     Comments: No midline tenderness from cervical to lumbar spine, no step off No paraspinal muscle ttp from cervical to lumbar spine  Grossly normal ROM of back 5/5 strength bilaterally with dorsiflexion, plantarflexion, flexion and extension at knees, and flexion and extension at hips Grossly normal sensation to light touch to bilateral lower extremities Neg SLR bilaterally   Skin:    General: Skin is warm and dry.     Findings: No rash.  Neurological:     Mental Status: She is alert.     Motor: No abnormal muscle tone.     Coordination: Coordination normal.  Psychiatric:        Behavior: Behavior normal.      Results for orders placed or  performed in visit on 01/03/22  Glucose, capillary  Result Value Ref Range   Glucose-Capillary 148 (H) 70 - 99 mg/dL  Glucose, capillary  Result Value Ref Range   Glucose-Capillary 132 (H) 70 - 99 mg/dL       Assessment & Plan:   1. Right-sided low back pain without sciatica, unspecified chronicity Gradually worsening acute on chronic pain since doing her pulmonary rehab and increasing activity Advised NSAIDs, muscle relaxers, heat - PT consult and treatment  - tiZANidine (ZANAFLEX) 4 MG tablet; Take 1 tablet (4 mg total) by mouth every 8 (eight) hours as needed for muscle spasms (muscle tightness/spasms).  Dispense: 30 tablet; Refill: 1 - DG Lumbar Spine Complete; Future - Ambulatory referral to Physical Therapy  2. Gastroesophageal reflux disease, unspecified whether esophagitis present Use PPI BID to protect stomach against NSAIDs  - pantoprazole (PROTONIX) 20 MG tablet; Take 1 tablet (20 mg total) by mouth 2 (two) times daily. For stomach protection with NSAIDs and GERD  Dispense: 60 tablet; Refill: 1       Delsa Grana, PA-C 01/27/22 9:55 AM

## 2022-01-30 ENCOUNTER — Encounter: Payer: Self-pay | Admitting: Family Medicine

## 2022-01-30 NOTE — Progress Notes (Signed)
Patient's appointment was rescheduled for the following day

## 2022-01-31 ENCOUNTER — Encounter: Payer: Medicaid Other | Admitting: *Deleted

## 2022-01-31 DIAGNOSIS — D869 Sarcoidosis, unspecified: Secondary | ICD-10-CM

## 2022-01-31 NOTE — Progress Notes (Signed)
Daily Session Note  Patient Details  Name: Brenda Ellison MRN: [id number] Date of Birth: 31-Jul-1960 Referring Provider:   Flowsheet Row Pulmonary Rehab from 12/15/2021 in [facility] Cardiac and Pul[facility] Rehab  Referring Provider Thad Ranger, MD       Encounter Date: 01/31/2022  Check In:  Session Check In - 01/31/22 0955       Check-In   Supervising physician immediately available to respond to emergencies See telemetry face sheet for immediately available ER MD    Location [facility]-Cardiac & Pulmonary Rehab    Staff Present Renita Papa, RN Abel Presto, MS, ACSM CEP, Exercise Physiologist;Jessica Luan Pulling, MA, RCEP, CCRP, CCET    Virtual Visit No    Medication changes reported     No    Fall or balance concerns reported    No    Warm-up and Cool-down Performed on first and last piece of equipment    Resistance Training Performed Yes    VAD Patient? No    PAD/SET Patient? No      Pain Assessment   Currently in Pain? No/denies                Social History   Tobacco Use  Smoking Status Former   Packs/day: 1.00   Years: 22.00   Total pack years: 22.00   Types: Cigarettes   Start date: 02/21/1988   Quit date: 02/20/2010   Years since quitting: 11.9  Smokeless Tobacco Never    Goals Met:  Independence with exercise equipment Exercise tolerated well No report of concerns or symptoms today Strength training completed today  Goals Unmet:  Not Applicable  Comments: Pt able to follow exercise prescription today without complaint.  Will continue to monitor for progression.    Dr. Emily Filbert is Medical Director for [place].  Dr. Ottie Glazier is Medical Director for [facility] Pulmonary Rehabilitation.

## 2022-02-02 ENCOUNTER — Encounter: Payer: Medicaid Other | Admitting: *Deleted

## 2022-02-02 ENCOUNTER — Telehealth: Payer: Self-pay | Admitting: Family Medicine

## 2022-02-02 DIAGNOSIS — D869 Sarcoidosis, unspecified: Secondary | ICD-10-CM

## 2022-02-02 NOTE — Telephone Encounter (Signed)
Patient called in went to referral appt and was told she has gallstones in her back and she is wanting to know what to do next. Please call back

## 2022-02-02 NOTE — Progress Notes (Signed)
Daily Session Note  Patient Details  Name: Brenda Ellison MRN: [id number] Date of Birth: 09/13/1960 Referring Provider:   Flowsheet Row Pulmonary Rehab from 12/15/2021 in [facility] Cardiac and Pulmonary Rehab  Referring Provider Thad Ranger, MD       Encounter Date: 02/02/2022  Check In:  Session Check In - 02/02/22 1004       Check-In   Supervising physician immediately available to respond to emergencies See telemetry face sheet for immediately available ER MD    Location [facility]-Cardiac & Pulmonary Rehab    Staff Present Darlyne Russian, RN, Viviana Simpler, RDN, LDN;Jessica Luan Pulling, MA, RCEP, CCRP, CCET    Virtual Visit No    Medication changes reported     No    Fall or balance concerns reported    No    Warm-up and Cool-down Performed on first and last piece of equipment    Resistance Training Performed Yes    VAD Patient? No    PAD/SET Patient? No      Pain Assessment   Currently in Pain? No/denies                Social History   Tobacco Use  Smoking Status Former   Packs/day: 1.00   Years: 22.00   Total pack years: 22.00   Types: Cigarettes   Start date: 02/21/1988   Quit date: 02/20/2010   Years since quitting: 11.9  Smokeless Tobacco Never    Goals Met:  Independence with exercise equipment Exercise tolerated well No report of concerns or symptoms today Strength training completed today  Goals Unmet:  Not Applicable  Comments: Pt able to follow exercise prescription today without complaint.  Will continue to monitor for progression.    Dr. Emily Filbert is Medical Director for [place].  Dr. Ottie Glazier is Medical Director for [facility] Pulmonary Rehabilitation.

## 2022-02-03 NOTE — Telephone Encounter (Signed)
Reached out to patient no answer left vm to return call

## 2022-02-06 ENCOUNTER — Ambulatory Visit: Payer: Self-pay | Admitting: *Deleted

## 2022-02-07 ENCOUNTER — Encounter: Payer: Medicaid Other | Admitting: *Deleted

## 2022-02-07 DIAGNOSIS — D869 Sarcoidosis, unspecified: Secondary | ICD-10-CM

## 2022-02-07 NOTE — Progress Notes (Signed)
Daily Session Note  Patient Details  Name: Brenda Ellison MRN: [id number] Date of Birth: Jun 08, 1960 Referring Provider:   Flowsheet Row Pulmonary Rehab from 12/15/2021 in [facility] Cardiac and Pulmonary Rehab  Referring Provider Thad Ranger, MD       Encounter Date: 02/07/2022  Check In:  Session Check In - 02/07/22 1015       Check-In   Supervising physician immediately available to respond to emergencies See telemetry face sheet for immediately available ER MD    Location [facility]-Cardiac & Pulmonary Rehab    Staff Present Heath Lark, RN, BSN, CCRP;Meredith Sherryll Burger, RN BSN;Jessica [place], MA, RCEP, CCRP, Bertram Gala, MS, ACSM CEP, Exercise Physiologist    Virtual Visit No    Medication changes reported     No    Fall or balance concerns reported    No    Warm-up and Cool-down Performed on first and last piece of equipment    Resistance Training Performed Yes    VAD Patient? No    PAD/SET Patient? No      Pain Assessment   Currently in Pain? No/denies               Exercise Prescription Changes - 02/07/22 0900       Home Exercise Plan   Plans to continue exercise at Home (comment)   walking, staff videos   Frequency Add 3 additional days to program exercise sessions.    Initial Home Exercises Provided 02/07/22             Social History   Tobacco Use  Smoking Status Former   Packs/day: 1.00   Years: 22.00   Total pack years: 22.00   Types: Cigarettes   Start date: 02/21/1988   Quit date: 02/20/2010   Years since quitting: 11.9  Smokeless Tobacco Never    Goals Met:  Proper associated with RPD/PD & O2 Sat Independence with exercise equipment Exercise tolerated well No report of concerns or symptoms today  Goals Unmet:  Not Applicable  Comments: Pt able to follow exercise prescription today without complaint.  Will continue to monitor for progression.    Dr. Emily Filbert is Medical Director for [place].  Dr. Ottie Glazier is Medical Director for [facility] Pulmonary Rehabilitation.

## 2022-02-09 ENCOUNTER — Encounter: Payer: Medicaid Other | Admitting: *Deleted

## 2022-02-09 DIAGNOSIS — D869 Sarcoidosis, unspecified: Secondary | ICD-10-CM

## 2022-02-09 NOTE — Progress Notes (Signed)
Daily Session Note  Patient Details  Name: Brenda Ellison MRN: [id number] Date of Birth: [birth year] Referring Provider:   Flowsheet Row Pulmonary Rehab from 12/15/2021 in [facility] Card[facility] and Pulmonary Rehab  Referring Provider Thad Ranger, MD       Encounter Date: 02/09/2022  Check In:  Session Check In - 02/09/22 1012       Check-In   Supervising physician immediately available to respond to emergencies See telemetry face sheet for immediately available ER MD    Location [facility]Cardiac & Pulmonary Rehab    Staff Present Heath Lark, RN, BSN, CCRP;Meredith Sherryll Burger, RN BSN;Joseph Xenia, RCP,RRT,BSRT;Noah Tickle, [place], Exercise Physiologist    Virtual Visit No    Medication changes reported     No    Fall or balance concerns reported    No    Warm-up and Cool-down Performed on first and last piece of equipment    Resistance Training Performed Yes    VAD Patient? No    PAD/SET Patient? No      Pain Assessment   Currently in Pain? No/denies                Social History   Tobacco Use  Smoking Status Former   Packs/day: 1.00   Years: 22.00   Total pack years: 22.00   Types: Cigarettes   Start date: 02/21/1988   Quit date: 02/20/2010   Years since quitting: 11.9  Smokeless Tobacco Never    Goals Met:  Proper associated with RPD/PD & O2 Sat Independence with exercise equipment Exercise tolerated well No report of concerns or symptoms today  Goals Unmet:  Not Applicable  Comments: Pt able to follow exercise prescription today without complaint.  Will continue to monitor for progression.    Dr. Emily Filbert is Medical Director for [place].  Dr. Ottie Glazier is Medical Director for [facility] Pulmonary Rehabilitation.

## 2022-02-14 ENCOUNTER — Encounter: Payer: Medicaid Other | Admitting: *Deleted

## 2022-02-14 DIAGNOSIS — D869 Sarcoidosis, unspecified: Secondary | ICD-10-CM

## 2022-02-14 NOTE — Progress Notes (Signed)
Daily Session Note  Patient Details  Name: Brenda Ellison MRN: [id number] Date of Birth: 1960-06-07 Referring Provider:   Flowsheet Row Pulmonary Rehab from 12/15/2021 in [facility] Cardiac and Pulmonary Rehab  Referring Provider Thad Ranger, MD       Encounter Date: 02/14/2022  Check In:  Session Check In - 02/14/22 1030       Check-In   Supervising physician immediately available to respond to emergencies See telemetry face sheet for immediately available ER MD    Location [facility]-Cardiac & Pulmonary Rehab    Staff Present Heath Lark, RN, BSN, CCRP;Jessica [place], MA, RCEP, CCRP, CCET;Meredith [place], RN BSN;Noah Tickle, BS, Exercise Physiologist    Virtual Visit No    Medication changes reported     No    Fall or balance concerns reported    No    Warm-up and Cool-down Performed on first and last piece of equipment    Resistance Training Performed Yes    VAD Patient? No    PAD/SET Patient? No      Pain Assessment   Currently in Pain? No/denies                Social History   Tobacco Use  Smoking Status Former   Packs/day: 1.00   Years: 22.00   Total pack years: 22.00   Types: Cigarettes   Start date: 02/21/1988   Quit date: 02/20/2010   Years since quitting: 11.9  Smokeless Tobacco Never    Goals Met:  Proper associated with RPD/PD & O2 Sat Independence with exercise equipment Exercise tolerated well No report of concerns or symptoms today  Goals Unmet:  Not Applicable  Comments: Pt able to follow exercise prescription today without complaint.  Will continue to monitor for progression.    Dr. Emily Filbert is Medical Director for [place].  Dr. Ottie Glazier is Medical Director for [facility] Pulmonary Rehabilitation.

## 2022-02-15 ENCOUNTER — Encounter: Payer: Self-pay | Admitting: *Deleted

## 2022-02-15 DIAGNOSIS — D869 Sarcoidosis, unspecified: Secondary | ICD-10-CM

## 2022-02-15 NOTE — Progress Notes (Signed)
Pulmonary Individual Treatment Plan  Patient Details  Name: Brenda Ellison MRN: [id number] Date of Birth: 04/28/1960 Referring Provider:   Flowsheet Row Pulmonary Rehab from 12/15/2021 in [facility] Cardiac and Pulmonary Rehab  Referring Provider Thad Ranger, MD       Initial Encounter Date:  Flowsheet Row Pulmonary Rehab from 12/15/2021 in [facility] Cardiac and Pulmonary Rehab  Date 12/15/21       Visit Diagnosis: Sarcoidosis  Patient's Home Medications on Admission:  Current Outpatient Medications:    albuterol (VENTOLIN HFA) 108 (90 Base) MCG/ACT inhaler, Inhale 2 puffs into the lungs every 4 (four) hours as needed for wheezing or shortness of breath., Disp: 18 g, Rfl: 3   albuterol (VENTOLIN HFA) 108 (90 Base) MCG/ACT inhaler, , Disp: , Rfl:    atorvastatin (LIPITOR) 20 MG tablet, TAKE 1 TABLET BY MOUTH AT BEDTIME, Disp: 30 tablet, Rfl: 8   benzonatate (TESSALON) 100 MG capsule, Take 1-2 capsules (100-200 mg total) by mouth 3 (three) times daily as needed for cough., Disp: 30 capsule, Rfl: 1   blood glucose meter kit and supplies, Dispense based on patient and insurance preference. Use up to four times daily as directed. (FOR ICD-10 E10.9, E11.9)., Disp: 1 each, Rfl: 0   budesonide-formoterol (SYMBICORT) 160-4.5 MCG/ACT inhaler, Inhale 2 puffs into the lungs 2 (two) times daily., Disp: 1 each, Rfl: 0   celecoxib (CELEBREX) 100 MG capsule, Take 1 capsule by mouth 2 (two) times daily., Disp: , Rfl:    cetirizine (ZYRTEC ALLERGY) 10 MG tablet, 1 tablet Orally Once a day for 30 day(s), Disp: , Rfl:    Cholecalciferol (VITAMIN D3) 2000 UNITS capsule, Take 2,000 Units by mouth daily., Disp: , Rfl:    dapagliflozin propanediol (FARXIGA) 5 MG TABS tablet, Take 1 tablet (5 mg total) by mouth daily before breakfast., Disp: 30 tablet, Rfl: 5   fluticasone (FLONASE) 50 MCG/ACT nasal spray, Place 2 sprays into both nostrils daily., Disp: 16 g, Rfl: 6   gabapentin (NEURONTIN) 100 MG capsule, Take 100  mg by mouth 2 (two) times daily., Disp: , Rfl:    levocetirizine (XYZAL) 5 MG tablet, Take 1 tablet (5 mg total) by mouth every evening., Disp: 90 tablet, Rfl: 1   meclizine (ANTIVERT) 12.5 MG tablet, Take 1 tablet (12.5 mg total) by mouth 3 (three) times daily as needed for dizziness., Disp: 30 tablet, Rfl: 0   metFORMIN (GLUCOPHAGE) 500 MG tablet, Take 500 mg by mouth 2 (two) times daily with a meal., Disp: , Rfl:    montelukast (SINGULAIR) 10 MG tablet, Take 1 tablet (10 mg total) by mouth at bedtime., Disp: 30 tablet, Rfl: 3   pantoprazole (PROTONIX) 20 MG tablet, Take 1 tablet (20 mg total) by mouth 2 (two) times daily. For stomach protection with NSAIDs and GERD, Disp: 60 tablet, Rfl: 1   promethazine-dextromethorphan (PROMETHAZINE-DM) 6.25-15 MG/5ML syrup, Take 2.5-5 mLs by mouth 3 (three) times daily as needed for cough., Disp: 118 mL, Rfl: 1   tiZANidine (ZANAFLEX) 4 MG tablet, Take 1 tablet (4 mg total) by mouth every 8 (eight) hours as needed for muscle spasms (muscle tightness/spasms)., Disp: 30 tablet, Rfl: 1  Past Medical History: Past Medical History:  Diagnosis Date   Arthritis pain    Bilateral leg pain    Chronic nasal congestion    Diabetes mellitus without complication (HCC)    Dizziness of unknown cause    Heart murmur previously undiagnosed    Osteoarthritis, hand    Prediabetes 07/12/2016  Tobacco Use: Social History   Tobacco Use  Smoking Status Former   Packs/day: 1.00   Years: 22.00   Total pack years: 22.00   Types: Cigarettes   Start date: 02/21/1988   Quit date: 02/20/2010   Years since quitting: 11.9  Smokeless Tobacco Never    Labs: Review Flowsheet  More data exists      Latest Ref Rng & Units 06/15/2020 09/07/2020 06/23/2021 09/28/2021 12/26/2021  Labs for ITP Cardiac and Pulmonary Rehab  Cholestrol <200 mg/dL - - 151  - -  LDL (calc) mg/dL (calc) - - 69  - -  HDL-C > OR = 50 mg/dL - - 47  - -  Trlycerides <150 mg/dL - - 263  - -  Hemoglobin A1c  4.0 - 5.6 % 6.9  6.5  8.0  8.3  6.1      Pulmonary Assessment Scores:  Pulmonary Assessment Scores     Row Name 12/15/21 1423         ADL UCSD   SOB Score total 56     Rest 0     Walk 3     Stairs 5     Bath 3     Dress 3     Shop 4       CAT Score   CAT Score 15       mMRC Score   mMRC Score 1              UCSD: Self-administered rating of dyspnea associated with activities of daily living (ADLs) 6-point scale (0 = "not at all" to 5 = "maximal or unable to do because of breathlessness")  Scoring Scores range from 0 to 120.  Minimally important difference is 5 units  CAT: CAT can identify the health impairment of COPD patients and is better correlated with disease progression.  CAT has a scoring range of zero to 40. The CAT score is classified into four groups of low (less than 10), medium (10 - 20), high (21-30) and very high (31-40) based on the impact level of disease on health status. A CAT score over 10 suggests significant symptoms.  A worsening CAT score could be explained by an exacerbation, poor medication adherence, poor inhaler technique, or progression of COPD or comorbid conditions.  CAT MCID is 2 points  mMRC: mMRC (Modified Medical Research Council) Dyspnea Scale is used to assess the degree of baseline functional disability in patients of respiratory disease due to dyspnea. No minimal important difference is established. A decrease in score of 1 point or greater is considered a positive change.   Pulmonary Function Assessment:  Pulmonary Function Assessment - 12/12/21 1118       Breath   Shortness of Breath Yes;Limiting activity             Exercise Target Goals: Exercise Program Goal: Individual exercise prescription set using results from initial 6 min walk test and THRR while considering  patient's activity barriers and safety.   Exercise Prescription Goal: Initial exercise prescription builds to 30-45 minutes a day of aerobic  activity, 2-3 days per week.  Home exercise guidelines will be given to patient during program as part of exercise prescription that the participant will acknowledge.  Education: Aerobic Exercise: - Group verbal and visual presentation on the components of exercise prescription. Introduces F.I.T.T principle from ACSM for exercise prescriptions.  Reviews F.I.T.T. principles of aerobic exercise including progression. Written material given at graduation. Flowsheet Row Pulmonary Rehab from 02/02/2022 in  [place] Cardiac and Pulmonary Rehab  Education need identified 12/15/21  Date 12/29/21  Educator [facility]  Instruction Review Code 1- Verbalizes Understanding       Education: Resistance Exercise: - Group verbal and visual presentation on the components of exercise prescription. Introduces F.I.T.T principle from ACSM for exercise prescriptions  Reviews F.I.T.T. principles of resistance exercise including progression. Written material given at graduation.    Education: Exercise & Equipment Safety: - Individual verbal instruction and demonstration of equipment use and safety with use of the equipment. Flowsheet Row Pulmonary Rehab from 02/02/2022 in [facility] Cardiac and Pulmonary Rehab  Date 12/12/21  Educator [facility] [place]  Instruction Review Code 1- Verbalizes Understanding       Education: Exercise Physiology & General Exercise Guidelines: - Group verbal and written instruction with models to review the exercise physiology of the cardiovascular system and associated critical values. Provides general exercise guidelines with specific guidelines to those with heart or lung disease.    Education: Flexibility, Balance, Mind/Body Relaxation: - Group verbal and visual presentation with interactive activity on the components of exercise prescription. Introduces F.I.T.T principle from ACSM for exercise prescriptions. Reviews F.I.T.T. principles of flexibility and balance exercise training including progression. Also  discusses the mind body connection.  Reviews various relaxation techniques to help reduce and manage stress (i.e. Deep breathing, progressive muscle relaxation, and visualization). Balance handout provided to take home. Written material given at graduation.   Activity Barriers & Risk Stratification:  Activity Barriers & Cardiac Risk Stratification - 12/15/21 1456       Activity Barriers & Cardiac Risk Stratification   Activity Barriers Deconditioning;Shortness of Breath             6 Minute Walk:  6 Minute Walk     Row Name 12/15/21 1448         6 Minute Walk   Distance 1080 feet     Walk Time 6 minutes     # of Rest Breaks 0     MPH 2.05     METS 2.9     RPE 11     Perceived Dyspnea  2     VO2 Peak 10.15     Symptoms Yes (comment)     Comments SOB, Fatigue     Resting HR 60 bpm     Resting BP 118/62     Resting Oxygen Saturation  95 %     Exercise Oxygen Saturation  during 6 min walk 92 %     Max Ex. HR 96 bpm     Max Ex. BP 132/84     2 Minute Post BP 120/70       Interval HR   1 Minute HR 83     2 Minute HR 91     3 Minute HR 91     4 Minute HR 93     5 Minute HR 96     6 Minute HR 95     2 Minute Post HR 67     Interval Heart Rate? Yes       Interval Oxygen   Interval Oxygen? Yes     Baseline Oxygen Saturation % 95 %     1 Minute Oxygen Saturation % 95 %     1 Minute Liters of Oxygen 0 L  RA     2 Minute Oxygen Saturation % 92 %     2 Minute Liters of Oxygen 0 L  RA     3 Minute Oxygen Saturation %  94 %     3 Minute Liters of Oxygen 0 L  RA     4 Minute Oxygen Saturation % 95 %     4 Minute Liters of Oxygen 0 L  RA     5 Minute Oxygen Saturation % 94 %     5 Minute Liters of Oxygen 0 L  RA     6 Minute Oxygen Saturation % 92 %     6 Minute Liters of Oxygen 0 L  RA     2 Minute Post Oxygen Saturation % 97 %     2 Minute Post Liters of Oxygen 0 L  RA             Oxygen Initial Assessment:  Oxygen Initial Assessment - 12/27/21 1040        Home Oxygen   Home Oxygen Device None    Sleep Oxygen Prescription None    Home Exercise Oxygen Prescription None    Home Resting Oxygen Prescription None    Compliance with Home Oxygen Use Yes      Intervention   Short Term Goals To learn and demonstrate proper pursed lip breathing techniques or other breathing techniques.     Long  Term Goals Exhibits proper breathing techniques, such as pursed lip breathing or other method taught during program session             Oxygen Re-Evaluation:  Oxygen Re-Evaluation     Crisp Name 12/27/21 1041 01/05/22 1008 02/02/22 1101         Program Oxygen Prescription   Program Oxygen Prescription -- None None       Home Oxygen   Home Oxygen Device -- None None     Sleep Oxygen Prescription -- None None     Home Exercise Oxygen Prescription -- None None     Home Resting Oxygen Prescription -- None None     Compliance with Home Oxygen Use -- Yes No       Goals/Expected Outcomes   Short Term Goals -- To learn and demonstrate proper pursed lip breathing techniques or other breathing techniques.  To learn and demonstrate proper pursed lip breathing techniques or other breathing techniques. ;To learn and understand importance of monitoring SPO2 with pulse oximeter and demonstrate accurate use of the pulse oximeter.;To learn and understand importance of maintaining oxygen saturations>88%     Long  Term Goals -- Exhibits proper breathing techniques, such as pursed lip breathing or other method taught during program session Verbalizes importance of monitoring SPO2 with pulse oximeter and return demonstration;Maintenance of O2 saturations>88%;Exhibits proper breathing techniques, such as pursed lip breathing or other method taught during program session;Compliance with respiratory medication     Comments Reviewed PLB technique with pt.  Talked about how it works and it's importance in maintaining their exercise saturations. Louie recently started rehab and  reports doing well. She reports her shortness of breath will get worse by "doing to much" - she feels this more right now as she is moving next Monday. She does not check her O2 at home - encouraged to use a pulse oximeter. Dalal reports that she uses PLB and feels this helps Iliyah has not been using her inhalers.  She also has not used her nebulizer recently.  We talked about the importance of using her meds the right way.  She said she will work on getting back into the habit.  She has also not been using her PLB so we reviewed  it again.     Goals/Expected Outcomes Short: Become more profiecient at using PLB. Long: Become independent at using PLB. Short: Become more profiecient at using PLB, consider getting a pulse oximeter  Long: Become independent at using PLB. Short: Get back to using meds and PLB Long Continue to improve compliance with meds              Oxygen Discharge (Final Oxygen Re-Evaluation):  Oxygen Re-Evaluation - 02/02/22 1101       Program Oxygen Prescription   Program Oxygen Prescription None      Home Oxygen   Home Oxygen Device None    Sleep Oxygen Prescription None    Home Exercise Oxygen Prescription None    Home Resting Oxygen Prescription None    Compliance with Home Oxygen Use No      Goals/Expected Outcomes   Short Term Goals To learn and demonstrate proper pursed lip breathing techniques or other breathing techniques. ;To learn and understand importance of monitoring SPO2 with pulse oximeter and demonstrate accurate use of the pulse oximeter.;To learn and understand importance of maintaining oxygen saturations>88%    Long  Term Goals Verbalizes importance of monitoring SPO2 with pulse oximeter and return demonstration;Maintenance of O2 saturations>88%;Exhibits proper breathing techniques, such as pursed lip breathing or other method taught during program session;Compliance with respiratory medication    Comments Jamella has not been using her inhalers.  She also has  not used her nebulizer recently.  We talked about the importance of using her meds the right way.  She said she will work on getting back into the habit.  She has also not been using her PLB so we reviewed it again.    Goals/Expected Outcomes Short: Get back to using meds and PLB Long Continue to improve compliance with meds             Initial Exercise Prescription:  Initial Exercise Prescription - 12/15/21 1400       Date of Initial Exercise RX and Referring Provider   Date 12/15/21    Referring Provider Thad Ranger, MD      Oxygen   Maintain Oxygen Saturation 88% or higher      Treadmill   MPH 1.8    Grade 0.5    Minutes 15    METs 2.5      Recumbant Bike   Level 1    RPM 50    Watts 22    Minutes 15    METs 2.9      NuStep   Level 2    SPM 80    Minutes 15    METs 2.9      T5 Nustep   Level 1    SPM 80    Minutes 15    METs 2.9      Prescription Details   Frequency (times per week) 2    Duration Progress to 30 minutes of continuous aerobic without signs/symptoms of physical distress      Intensity   THRR 40-80% of Max Heartrate 100-140    Ratings of Perceived Exertion 11-13    Perceived Dyspnea 0-4      Progression   Progression Continue to progress workloads to maintain intensity without signs/symptoms of physical distress.      Resistance Training   Training Prescription Yes    Weight 3 lb    Reps 10-15             Perform Capillary Blood Glucose checks as needed.  Exercise Prescription Changes:   Exercise Prescription Changes     Row Name 12/15/21 1400 01/16/22 1100 01/30/22 1300 02/07/22 0900       Response to Exercise   Blood Pressure (Admit) 118/62 118/72 122/68 --    Blood Pressure (Exercise) 132/84 134/70 134/82 --    Blood Pressure (Exit) 120/70 122/82 126/64 --    Heart Rate (Admit) 60 bpm 69 bpm 63 bpm --    Heart Rate (Exercise) 96 bpm 96 bpm 94 bpm --    Heart Rate (Exit) 67 bpm 69 bpm 72 bpm --    Oxygen  Saturation (Admit) 95 % 95 % 98 % --    Oxygen Saturation (Exercise) 92 % 89 % 91 % --    Oxygen Saturation (Exit) 97 % 98 % 95 % --    Rating of Perceived Exertion (Exercise) _0 --    Perceived Dyspnea (Exercise) _1 --    Symptoms SOB, Fatigue SOB SOB --    Comments 6MWT Results -- -- --    Duration -- Progress to 30 minutes of  aerobic without signs/symptoms of physical distress Progress to 30 minutes of  aerobic without signs/symptoms of physical distress --    Intensity -- THRR unchanged THRR unchanged --      Progression   Progression -- Continue to progress workloads to maintain intensity without signs/symptoms of physical distress. Continue to progress workloads to maintain intensity without signs/symptoms of physical distress. --    Average METs -- 1.88 2.53 --      Resistance Training   Training Prescription -- Yes Yes --    Weight -- 3 lb 3 lb --    Reps -- 10-15 10-15 --      Interval Training   Interval Training -- No No --      Treadmill   MPH -- 0.6 -- --    Grade -- 0.5 -- --    Minutes -- 15 -- --    METs -- 1.5 -- --      Recumbant Bike   Level -- 1 1 --    Watts -- 10 18 --    Minutes -- 15 15 --      NuStep   Level -- 2 3 --    Minutes -- 15 15 --    METs -- 2.1 2.6 --      T5 Nustep   Level -- 1 1 --    Minutes -- 15 15 --    METs -- 1.9 1.9 --      Track   Laps -- 19 30 --    Minutes -- 15 15 --    METs -- 2.03 2.63 --      Home Exercise Plan   Plans to continue exercise at -- -- -- Home (comment)  walking, staff videos    Frequency -- -- -- Add 3 additional days to program exercise sessions.    Initial Home Exercises Provided -- -- -- 02/07/22      Oxygen   Maintain Oxygen Saturation -- 88% or higher 88% or higher --             Exercise Comments:   Exercise Comments     Row Name 12/27/21 1040           Exercise Comments First full day of exercise!  Patient was oriented to gym and equipment including functions,  settings, policies, and procedures.  Patient's individual exercise prescription and treatment  plan were reviewed.  All starting workloads were established based on the results of the 6 minute walk test done at initial orientation visit.  The plan for exercise progression was also introduced and progression will be customized based on patient's performance and goals.                Exercise Goals and Review:   Exercise Goals     Row Name 12/15/21 1501             Exercise Goals   Increase Physical Activity Yes       Intervention Provide advice, education, support and counseling about physical activity/exercise needs.;Develop an individualized exercise prescription for aerobic and resistive training based on initial evaluation findings, risk stratification, comorbidities and participant's personal goals.       Expected Outcomes Short Term: Attend rehab on a regular basis to increase amount of physical activity.;Long Term: Add in home exercise to make exercise part of routine and to increase amount of physical activity.;Long Term: Exercising regularly at least 3-5 days a week.       Increase Strength and Stamina Yes       Intervention Provide advice, education, support and counseling about physical activity/exercise needs.;Develop an individualized exercise prescription for aerobic and resistive training based on initial evaluation findings, risk stratification, comorbidities and participant's personal goals.       Expected Outcomes Short Term: Increase workloads from initial exercise prescription for resistance, speed, and METs.;Long Term: Improve cardiorespiratory fitness, muscular endurance and strength as measured by increased METs and functional capacity (6MWT);Short Term: Perform resistance training exercises routinely during rehab and add in resistance training at home       Able to understand and use rate of perceived exertion (RPE) scale Yes       Intervention Provide education and  explanation on how to use RPE scale       Expected Outcomes Short Term: Able to use RPE daily in rehab to express subjective intensity level;Long Term:  Able to use RPE to guide intensity level when exercising independently       Able to understand and use Dyspnea scale Yes       Intervention Provide education and explanation on how to use Dyspnea scale       Expected Outcomes Long Term: Able to use Dyspnea scale to guide intensity level when exercising independently;Short Term: Able to use Dyspnea scale daily in rehab to express subjective sense of shortness of breath during exertion       Knowledge and understanding of Target Heart Rate Range (THRR) Yes       Intervention Provide education and explanation of THRR including how the numbers were predicted and where they are located for reference       Expected Outcomes Short Term: Able to state/look up THRR;Long Term: Able to use THRR to govern intensity when exercising independently;Short Term: Able to use daily as guideline for intensity in rehab       Able to check pulse independently Yes       Intervention Provide education and demonstration on how to check pulse in carotid and radial arteries.;Review the importance of being able to check your own pulse for safety during independent exercise       Expected Outcomes Short Term: Able to explain why pulse checking is important during independent exercise;Long Term: Able to check pulse independently and accurately       Understanding of Exercise Prescription Yes  Intervention Provide education, explanation, and written materials on patient's individual exercise prescription       Expected Outcomes Short Term: Able to explain program exercise prescription;Long Term: Able to explain home exercise prescription to exercise independently                Exercise Goals Re-Evaluation :  Exercise Goals Re-Evaluation     Row Name 12/27/21 1040 01/05/22 0959 01/16/22 1133 01/30/22 1356 02/02/22  1048     Exercise Goal Re-Evaluation   Exercise Goals Review Able to understand and use rate of perceived exertion (RPE) scale;Knowledge and understanding of Target Heart Rate Range (THRR);Able to understand and use Dyspnea scale;Able to check pulse independently;Understanding of Exercise Prescription Able to understand and use rate of perceived exertion (RPE) scale;Knowledge and understanding of Target Heart Rate Range (THRR);Able to understand and use Dyspnea scale;Able to check pulse independently;Understanding of Exercise Prescription Increase Physical Activity;Increase Strength and Stamina;Understanding of Exercise Prescription Increase Physical Activity;Increase Strength and Stamina;Understanding of Exercise Prescription Increase Physical Activity;Increase Strength and Stamina;Understanding of Exercise Prescription   Comments Reviewed RPE scale, THR and program prescription with pt today.  Pt voiced understanding and was given a copy of goals to take home. Damyia reports doing well in rehab, but reports not working out at home right now becuase she is moving next Monday so she has been active. She has not met with EP yet to go over home exercise. Larie has had a good start to rehab the last couple of weeks. She was able to complete 19 laps on the track, she did try the treadmill which was more of a challenge for her and was not able to pace as fast as she was able to walk at a 0.6 mph speed. If she wants to walk on the treadmill, we hope to see that improve over time. The other machines she worked on she worked at her initial exercise prescription well. Will continue to monitor. Safia is doing well in rehab. She recently improved her overall average MET level to 2.53 METs. She also increased her number of laps walked on the track to 30 laps. She improved to level 3 on the T4 as well. We will continue to monitor her progress in the program. Lakesha is doing well in rehab.  She is not doing her home exercise yet.   She was waiting til she moved, but now that she has moved she is still not going.  We talked about getting up from TV and go for walk . She does feel liek her strength and stamina are improving.   Expected Outcomes Short: Use RPE daily to regulate intensity. Long: Follow program prescription in THR. Short: Meet with EP to go over home exercise Long: Follow program prescription in THR. Short: Continue to increase speed on treadmill Long: Continue to build up overall strength and stamina Short: Continue to push for more laps on the track. Long: Continue to increase strength and stamina. Short: Start to walk more in rehab Long: Continue to improve stamina    Row Name 02/07/22 0950             Exercise Goal Re-Evaluation   Exercise Goals Review Increase Physical Activity;Increase Strength and Stamina;Understanding of Exercise Prescription;Able to understand and use rate of perceived exertion (RPE) scale;Knowledge and understanding of Target Heart Rate Range (THRR);Able to understand and use Dyspnea scale;Able to check pulse independently       Comments Reviewed home exercise with pt today.  Pt plans  to walk and use staff videos at home for exercise.  Reviewed THR, pulse, RPE, sign and symptoms, pulse oximetery and when to call 911 or MD.  Also discussed weather considerations and indoor options.  Pt voiced understanding.  We also talked about getting a pulse oximeter to monitor at home.       Expected Outcomes Short: Start to add in walking at home and get pulse oximeter Long: Continue to improve stamina                Discharge Exercise Prescription (Final Exercise Prescription Changes):  Exercise Prescription Changes - 02/07/22 0900       Home Exercise Plan   Plans to continue exercise at Home (comment)   walking, staff videos   Frequency Add 3 additional days to program exercise sessions.    Initial Home Exercises Provided 02/07/22             Nutrition:  Target Goals:  Understanding of nutrition guidelines, daily intake of sodium <1586m, cholesterol <2022m calories 30% from fat and 7% or less from saturated fats, daily to have 5 or more servings of fruits and vegetables.  Education: All About Nutrition: -Group instruction provided by verbal, written material, interactive activities, discussions, models, and posters to present general guidelines for heart healthy nutrition including fat, fiber, MyPlate, the role of sodium in heart healthy nutrition, utilization of the nutrition label, and utilization of this knowledge for meal planning. Follow up email sent as well. Written material given at graduation. [facility]  Date 01/19/22  [nutrition 2]  [facility] Review Code 1- Verbalizes Understanding       Biometrics:  Pre Biometrics - 12/15/21 1501       Pre Biometrics   Height _0  (1.626 m)    Weight 174 lb 4.8 oz (79.1 kg)    Waist Circumference 44.5 inches    Hip Circumference 42.5 inches    Waist to Hip Ratio 1.05 %    BMI (Calculated) 29.9    Single Leg Stand 11.9 seconds   R             Nutrition Therapy Plan and Nutrition Goals:  Nutrition Therapy & Goals - 12/15/21 1158       Nutrition Therapy   Diet Heart healthy, low Na, T2DM MNT    Drug/Food Interactions Statins/Certain Fruits    Protein (specify units) 90-100g    Fiber 25 grams    Whole Grain Foods 3 servings    Saturated Fats 14 max. grams    Fruits and Vegetables 8 servings/day    Sodium 2 grams      Personal Nutrition Goals   Nutrition Goal ST: read over materials, practice MyPlate guidelines, include balanced snacks during the day LT: Achieve and maintain A1C <7, eat consistent meals/snacks during the day (about 1-2 servings of CHO per snack and 3-5 servings of CHO per meals), include protein, fiber, and healthy fat at most meals/snacks    Comments [age].o. F admitted to pulmonary rehab  for sarcoidosis. PMHx includes T2DM, GERD, DDD, HLD, osteoarthritis, former smoker. Relevant medications includes lipitor, vit D3, meclizine, metformin, pantoprazole, tizanidine. Last A1C 09/28/21 was 8.3. MaLeshawnad previously seen some regarding diabetes in nutrition this past summer, but has not been back. Discussed general heart healthy eating and T2DM MNT include pairing foods, quality and quantity of CHOs, eating consistently, hydration, and exercise. MaIngraeels that she does  not eat very consistently right now - discussed some snacks that she could include like an apple with peanut butter.      Intervention Plan   Intervention Prescribe, educate and counsel regarding individualized specific dietary modifications aiming towards targeted core components such as weight, hypertension, lipid management, diabetes, heart failure and other comorbidities.;Nutrition handout(s) given to patient.    Expected Outcomes Short Term Goal: Understand basic principles of dietary content, such as calories, fat, sodium, cholesterol and nutrients.;Short Term Goal: A plan has been developed with personal nutrition goals set during dietitian appointment.;Long Term Goal: Adherence to prescribed nutrition plan.             Nutrition Assessments:  MEDIFICTS Score Key: ?70 Need to make dietary changes  40-70 Heart Healthy Diet ? 40 Therapeutic Level Cholesterol Diet  Flowsheet Row Pulmonary Rehab from 12/15/2021 in [facility] Cardiac and Pulmonary Rehab  Picture Your Plate Total Score on Admission 71      Picture Your Plate Scores: <62 Unhealthy dietary pattern with much room for improvement. 41-50 Dietary pattern unlikely to meet recommendations for good health and room for improvement. 51-60 More healthful dietary pattern, with some room for improvement.  >60 Healthy dietary pattern, although there may be some specific behaviors that could be improved.   Nutrition Goals Re-Evaluation:  Nutrition Goals  Re-Evaluation     [place] Name 01/05/22 1009 02/02/22 1051           Goals   Nutrition Goal ST: read over materials, practice MyPlate guidelines, include balanced snacks during the day LT: Achieve and maintain A1C <7, eat consistent meals/snacks during the day (about 1-2 servings of CHO per snack and 3-5 servings of CHO per meals), include protein, fiber, and healthy fat at most meals/snacks ST: read over materials, practice MyPlate guidelines, include balanced snacks during the day LT: Achieve and maintain A1C <7, eat consistent meals/snacks during the day (about 1-2 servings of CHO per snack and 3-5 servings of CHO per meals), include protein, fiber, and healthy fat at most meals/snacks      Comment Monasia reports doing well with her nutrition; she reports no changes since the last time we spoke. Encouraged to focus on the short term goals we addressed when we first spoke. Reviewed general heart healthy eating. Rika is doing well in rehab.  She has been watching her sodium and sugar closely.  She is now using Splenda versus sugar.  She has not been eating fruits and vegetables as she does not like to put her teeth in.  We talked about eating more canned stuff to help but to rinse them off.  She has canned fruit but been avoiding it.  We talked about getting it in fruit juice versus syrup to help with sugar.  We also talked about making sure she balances out with protein. She is also eating frank and beans a lot and we talked about how much sugar is in that.      Expected Outcome ST: read over materials, practice MyPlate guidelines, include balanced snacks during the day LT: Achieve and maintain A1C <7, eat consistent meals/snacks during the day (about 1-2 servings of CHO per snack and 3-5 servings of CHO per meals), include protein, fiber, and healthy fat at most meals/snacks Short: Add back in more fruit that is unsweetened Long: Continue to work on improving diet               Nutrition Goals  Discharge (Final Nutrition Goals Re-Evaluation):  Nutrition  Goals Re-Evaluation - 02/02/22 1051       Goals   Nutrition Goal ST: read over materials, practice MyPlate guidelines, include balanced snacks during the day LT: Achieve and maintain A1C <7, eat consistent meals/snacks during the day (about 1-2 servings of CHO per snack and 3-5 servings of CHO per meals), include protein, fiber, and healthy fat at most meals/snacks    Comment Shanell is doing well in rehab.  She has been watching her sodium and sugar closely.  She is now using Splenda versus sugar.  She has not been eating fruits and vegetables as she does not like to put her teeth in.  We talked about eating more canned stuff to help but to rinse them off.  She has canned fruit but been avoiding it.  We talked about getting it in fruit juice versus syrup to help with sugar.  We also talked about making sure she balances out with protein. She is also eating frank and beans a lot and we talked about how much sugar is in that.    Expected Outcome Short: Add back in more fruit that is unsweetened Long: Continue to work on improving diet             Psychosocial: Target Goals: Acknowledge presence or absence of significant depression and/or stress, maximize coping skills, provide positive support system. Participant is able to verbalize types and ability to use techniques and skills needed for reducing stress and depression.   Education: Stress, Anxiety, and Depression - Group verbal and visual presentation to define topics covered.  Reviews how body is impacted by stress, anxiety, and depression.  Also discusses healthy ways to reduce stress and to treat/manage anxiety and depression.  Written material given at graduation.   Education: Sleep Hygiene -Provides group verbal and written instruction about how sleep can affect your health.  Define sleep hygiene, discuss sleep cycles and impact of sleep habits. Review good sleep hygiene tips.     Initial Review & Psychosocial Screening:  Initial Psych Review & Screening - 12/12/21 1119       Initial Review   Current issues with None Identified      Family Dynamics   Good Support System? Yes    Comments Remmy can look to her sister inlaw and sons for support. She has church family and pastor for support as well. The normal stresses of daily life are her only stressors.      Barriers   Psychosocial barriers to participate in program The patient should benefit from training in stress management and relaxation.      Screening Interventions   Interventions Encouraged to exercise;To provide support and resources with identified psychosocial needs;Provide feedback about the scores to participant    Expected Outcomes Short Term goal: Utilizing psychosocial counselor, staff and physician to assist with identification of specific Stressors or current issues interfering with healing process. Setting desired goal for each stressor or current issue identified.;Long Term Goal: Stressors or current issues are controlled or eliminated.;Short Term goal: Identification and review with participant of any Quality of Life or Depression concerns found by scoring the questionnaire.;Long Term goal: The participant improves quality of Life and PHQ9 Scores as seen by post scores and/or verbalization of changes             Quality of Life Scores:  Scores of 19 and below usually indicate a poorer quality of life in these areas.  A difference of  2-3 points is a clinically meaningful difference.  A difference of 2-3 points in the total score of the Quality of Life Index has been associated with significant improvement in overall quality of life, self-image, physical symptoms, and general health in studies assessing change in quality of life.  PHQ-9: Review Flowsheet  More data exists      01/27/2022 01/26/2022 01/23/2022 01/19/2022 12/15/2021  Depression screen PHQ 2/9  Decreased Interest 0 0 0 0 3   Down, Depressed, Hopeless 0 0 0 0 0  PHQ - 2 Score 0 0 0 0 3  Altered sleeping 0 0 0 0 3  Tired, decreased energy 0 0 0 1 3  Change in appetite 0 0 0 0 3  Feeling bad or failure about yourself  0 0 0 0 0  Trouble concentrating 0 0 0 1 0  Moving slowly or fidgety/restless 0 0 0 0 3  Suicidal thoughts 0 0 0 0 0  PHQ-9 Score 0 0 0 2 15  Difficult doing work/chores Not difficult at all Not difficult at all Not difficult at all - Not difficult at all   Interpretation of Total Score  Total Score Depression Severity:  1-4 = Minimal depression, 5-9 = Mild depression, 10-14 = Moderate depression, 15-19 = Moderately severe depression, 20-27 = Severe depression   Psychosocial Evaluation and Intervention:  Psychosocial Evaluation - 12/12/21 1122       Psychosocial Evaluation & Interventions   Interventions Encouraged to exercise with the program and follow exercise prescription;Relaxation education;Stress management education    Comments Skiler can look to her sister inlaw and sons for support. She has church family and pastor for support as well. The normal stresses of daily life are her only stressors.    Expected Outcomes Short: Start [facility] to help with mood. Long: Maintain a healthy mental state.    Continue Psychosocial Services  Follow up required by staff             Psychosocial Re-Evaluation:  Psychosocial Re-Evaluation     [place] Name 01/05/22 1001 01/19/22 0955 02/02/22 1049         Psychosocial Re-Evaluation   Current issues with Current Stress Concerns Current Stress Concerns Current Stress Concerns     Comments Zoii reports moving next Monday, she reports no other stressors at this time. She reports that she will sit down and take a minute to breathe to help with stress. Siah reports sleeping well. Audreana moved last week and is excited for her new house!!  Reviewed patient health questionnaire (PHQ-9) with patient for follow up. Previously, patients score indicated  signs/symptoms of depression.  Reviewed to see if patient is improving symptom wise while in program.  Score improved and patient states that it is because they have moved into the house she has always wanted and feeling better overall. Dessirae is doing well mentally.  She is in her new house and liking it!  She no longer has any major stressors going on.  She sleeps well overall.   She can tell rehab is helping her feel better overall.     Expected Outcomes ST: continue to exercise for mental health boost LT: maintain posisitve attitude Short: Continue to attend [facility] regularly for regular exercise and social engagement. Long: Continue to improve symptoms and manage a positive mental state. Short: Continue to attend [facility] regularly for regular exercise and social engagement. Long: Continue to improve symptoms and manage a positive mental state.     Interventions -- Encouraged to attend Pulmonary Rehabilitation for the exercise;Stress  management education Encouraged to attend Pulmonary Rehabilitation for the exercise     Continue Psychosocial Services  Follow up required by staff Follow up required by staff --              Psychosocial Discharge (Final Psychosocial Re-Evaluation):  Psychosocial Re-Evaluation - 02/02/22 1049       Psychosocial Re-Evaluation   Current issues with Current Stress Concerns    Comments Kathleen is doing well mentally.  She is in her new house and liking it!  She no longer has any major stressors going on.  She sleeps well overall.   She can tell rehab is helping her feel better overall.    Expected Outcomes Short: Continue to attend [facility] regularly for regular exercise and social engagement. Long: Continue to improve symptoms and manage a positive mental state.    Interventions Encouraged to attend Pulmonary Rehabilitation for the exercise             Education: Education Goals: Education classes will be provided on a weekly basis, covering required  topics. Participant will state understanding/return demonstration of topics presented.  Learning Barriers/Preferences:  Learning Barriers/Preferences - 12/12/21 1118       Learning Barriers/Preferences   Learning Barriers None    Learning Preferences None             General Pulmonary Education Topics:  Infection Prevention: - Provides verbal and written material to individual with discussion of infection control including proper hand washing and proper equipment cleaning during exercise session. Flowsheet Row Pulmonary Rehab from 02/02/2022 in [facility] Cardiac and Pulmonary Rehab  Date 12/12/21  Educator [facility]  Instruction Review Code 1- Verbalizes Understanding       Falls Prevention: - Provides verbal and written material to individual with discussion of falls prevention and safety. Flowsheet Row Pulmonary Rehab from 02/02/2022 in [facility] Cardiac and Pulmonary Rehab  Date 12/12/21  Educator [facility]  Instruction Review Code 1- Verbalizes Understanding       Chronic Lung Disease Review: - Group verbal instruction with posters, models, PowerPoint presentations and videos,  to review new updates, new respiratory medications, new advancements in procedures and treatments. Providing information on websites and "800" numbers for continued self-education. Includes information about supplement oxygen, available portable oxygen systems, continuous and intermittent flow rates, oxygen safety, concentrators, and Medicare reimbursement for oxygen. Explanation of Pulmonary Drugs, including class, frequency, complications, importance of spacers, rinsing mouth after steroid MDI's, and proper cleaning methods for nebulizers. Review of basic lung anatomy and physiology related to function, structure, and complications of lung disease. Review of risk factors. Discussion about methods for diagnosing sleep apnea and types of masks and machines for OSA. Includes a review of the use of types of environmental  controls: home humidity, furnaces, filters, dust mite/pet prevention, HEPA vacuums. Discussion about weather changes, air quality and the benefits of nasal washing. Instruction on Warning signs, infection symptoms, calling MD promptly, preventive modes, and value of vaccinations. Review of effective airway clearance, coughing and/or vibration techniques. Emphasizing that all should Create an Action Plan. Written material given at graduation. Flowsheet Row Pulmonary Rehab from 02/02/2022 in [facility] Cardiac and Pulmonary Rehab  Education need identified 12/15/21       AED/CPR: - Group verbal and written instruction with the use of models to demonstrate the basic use of the AED with the basic ABC's of resuscitation.    Anatomy and Cardiac Procedures: - Group verbal and visual presentation and models provide information about basic cardiac anatomy and  function. Reviews the testing methods done to diagnose heart disease and the outcomes of the test results. Describes the treatment choices: Medical Management, Angioplasty, or Coronary Bypass Surgery for treating various heart conditions including Myocardial Infarction, Angina, Valve Disease, and Cardiac Arrhythmias.  Written material given at graduation. Flowsheet Row Pulmonary Rehab from 02/02/2022 in [facility] Cardiac and Pulmonary Rehab  Date 01/26/22  Educator SB  Instruction Review Code 1- Verbalizes Understanding       Medication Safety: - Group verbal and visual instruction to review commonly prescribed medications for heart and lung disease. Reviews the medication, class of the drug, and side effects. Includes the steps to properly store meds and maintain the prescription regimen.  Written material given at graduation. Flowsheet Row Pulmonary Rehab from 02/02/2022 in [facility] Cardiac and Pulmonary Rehab  Date 02/02/22  Educator SB  Instruction Review Code 1- Verbalizes Understanding       Other: -Provides group and verbal instruction on  various topics (see comments)   Knowledge Questionnaire Score:  Knowledge Questionnaire Score - 12/15/21 1422       Knowledge Questionnaire Score   Pre Score 14/18              Core Components/Risk Factors/Patient Goals at Admission:  Personal Goals and Risk Factors at Admission - 12/15/21 1427       Core Components/Risk Factors/Patient Goals on Admission    Weight Management Yes;Weight Loss    Intervention Weight Management: Develop a combined nutrition and exercise program designed to reach desired caloric intake, while maintaining appropriate intake of nutrient and fiber, sodium and fats, and appropriate energy expenditure required for the weight goal.;Weight Management: Provide education and appropriate resources to help participant work on and attain dietary goals.;Weight Management/Obesity: Establish reasonable short term and long term weight goals.    Admit Weight 174 lb 4.8 oz (79.1 kg)    Goal Weight: Short Term 170 lb (77.1 kg)    Goal Weight: Long Term 164 lb (74.4 kg)    Expected Outcomes Short Term: Continue to assess and modify interventions until short term weight is achieved;Long Term: Adherence to nutrition and physical activity/exercise program aimed toward attainment of established weight goal;Weight Loss: Understanding of general recommendations for a balanced deficit meal plan, which promotes 1-2 lb weight loss per week and includes a negative energy balance of [phone] kcal/d;Understanding recommendations for meals to include 15-35% energy as protein, 25-35% energy from fat, 35-60% energy from carbohydrates, less than 228m of dietary cholesterol, 20-35 gm of total fiber daily;Understanding of distribution of calorie intake throughout the day with the consumption of 4-5 meals/snacks    Improve shortness of breath with ADL's Yes    Intervention Provide education, individualized exercise plan and daily activity instruction to help decrease symptoms of SOB with  activities of daily living.    Expected Outcomes Short Term: Improve cardiorespiratory fitness to achieve a reduction of symptoms when performing ADLs;Long Term: Be able to perform more ADLs without symptoms or delay the onset of symptoms    Diabetes Yes    Intervention Provide education about signs/symptoms and action to take for hypo/hyperglycemia.;Provide education about proper nutrition, including hydration, and aerobic/resistive exercise prescription along with prescribed medications to achieve blood glucose in normal ranges: Fasting glucose 65-99 mg/dL    Expected Outcomes Short Term: Participant verbalizes understanding of the signs/symptoms and immediate care of hyper/hypoglycemia, proper foot care and importance of medication, aerobic/resistive exercise and nutrition plan for blood glucose control.;Long Term: Attainment of HbA1C < 7%.  Education:Diabetes - Individual verbal and written instruction to review signs/symptoms of diabetes, desired ranges of glucose level fasting, after meals and with exercise. Acknowledge that pre and post exercise glucose checks will be done for 3 sessions at entry of program. Flowsheet Row Pulmonary Rehab from 02/02/2022 in [facility] Cardiac and Pulmonary Rehab  Date 12/12/21  Educator [facility]  Instruction Review Code 1- Verbalizes Understanding       Know Your Numbers and Heart Failure: - Group verbal and visual instruction to discuss disease risk factors for cardiac and pulmonary disease and treatment options.  Reviews associated critical values for Overweight/Obesity, Hypertension, Cholesterol, and Diabetes.  Discusses basics of heart failure: signs/symptoms and treatments.  Introduces Heart Failure Zone chart for action plan for heart failure.  Written material given at graduation.   Core Components/Risk Factors/Patient Goals Review:   Goals and Risk Factor Review     Row Name 01/05/22 1004 02/02/22 1054           Core Components/Risk  Factors/Patient Goals Review   Personal Goals Review Diabetes;Improve shortness of breath with ADL's;Weight Management/Obesity Diabetes;Improve shortness of breath with ADL's;Weight Management/Obesity;Increase knowledge of respiratory medications and ability to use respiratory devices properly.      Review Dayle recently started rehab and reports doing well. She reports her shortness of breath will get worse by "doing to much" - she feels this more right now as she is moving next Monday. She does not check her O2 at home - encouraged to use a pulse oximeter. Zuriah reports that she uses PLB and feels this helps. She reports her BG has been running around 100 at home in the am. She reports not taking her metformin this moring, but usually doing well with taking her medications as prescribed with no issues. Her weight has been stable since starting rehab. Emiyah is doing well in rehab.  Her weigth is staying steady.  Her pressures have been good and sugars are doign much better.  She has been running in the 140-150s.  She had one day at 191 and she was very surprised to see but it all evened back out.  She is doing well on her meds but not consistent about using them the way she should.      Expected Outcomes ST: consider getting a pulse oximeter to check O2 at home LT: manage risk factors Short: Continue to keep eye on her sugar Long: Continue to monitor risk factors               Core Components/Risk Factors/Patient Goals at Discharge (Final Review):   Goals and Risk Factor Review - 02/02/22 1054       Core Components/Risk Factors/Patient Goals Review   Personal Goals Review Diabetes;Improve shortness of breath with ADL's;Weight Management/Obesity;Increase knowledge of respiratory medications and ability to use respiratory devices properly.    Review Aliannah is doing well in rehab.  Her weigth is staying steady.  Her pressures have been good and sugars are doign much better.  She has been running in the  140-150s.  She had one day at 191 and she was very surprised to see but it all evened back out.  She is doing well on her meds but not consistent about using them the way she should.    Expected Outcomes Short: Continue to keep eye on her sugar Long: Continue to monitor risk factors             ITP Comments:  ITP Comments  Row Name 12/12/21 1116 12/15/21 1421 12/21/21 0852 12/27/21 1039 01/18/22 0734   ITP Comments Virtual Visit completed. Patient informed on EP and RD appointment and 6 Minute walk test. Patient also informed of patient health questionnaires on My Chart. Patient Verbalizes understanding. Visit diagnosis can be found in [facility] 11/09/2021. Completed 6MWT and gym orientation. Initial ITP created and sent for review to Dr Ottie Glazier, Medical Director. 30 Day review completed. Medical Director ITP review done, changes made as directed, and signed approval by Medical Director.    NEW TO PROGRAM First full day of exercise!  Patient was oriented to gym and equipment including functions, settings, policies, and procedures.  Patient's individual exercise prescription and treatment plan were reviewed.  All starting workloads were established based on the results of the 6 minute walk test done at initial orientation visit.  The plan for exercise progression was also introduced and progression will be customized based on patient's performance and goals. 30 Day review completed. Medical Director ITP review done, changes made as directed, and signed approval by Medical Director.   New to program    Row Name 02/15/22 0945           ITP Comments 30 Day review completed. Medical Director ITP review done, changes made as directed, and signed approval by Medical Director.                Comments:

## 2022-02-16 ENCOUNTER — Encounter: Payer: Medicaid Other | Admitting: *Deleted

## 2022-02-16 ENCOUNTER — Encounter: Payer: Self-pay | Admitting: *Deleted

## 2022-02-16 DIAGNOSIS — D869 Sarcoidosis, unspecified: Secondary | ICD-10-CM | POA: Diagnosis not present

## 2022-02-16 NOTE — Progress Notes (Signed)
Daily Session Note  Patient Details  Name: Brenda Ellison MRN: [id number] Date of Birth: 07-18-1960 Referring Provider:   Flowsheet Row Pulmonary Rehab from 12/15/2021 in [facility] Cardiac and Pulmonary Rehab  Referring Provider Thad Ranger, MD       Encounter Date: 02/16/2022  Check In:  Session Check In - 02/16/22 1108       Check-In   Supervising physician immediately available to respond to emergencies See telemetry face sheet for immediately available ER MD    Location [facility]-Cardiac & Pulmonary Rehab    Staff Present Darlyne Russian, RN, ADN;Laureen Owens Shark, BS, RRT, CPFT;Kara Maricela Bo, MS, ACSM CEP, Exercise Physiologist;Meredith Sherryll Burger, RN BSN    Virtual Visit No    Medication changes reported     No    Fall or balance concerns reported    No    Warm-up and Cool-down Performed on first and last piece of equipment    Resistance Training Performed Yes    VAD Patient? No    PAD/SET Patient? No      Pain Assessment   Currently in Pain? No/denies                Social History   Tobacco Use  Smoking Status Former   Packs/day: 1.00   Years: 22.00   Total pack years: 22.00   Types: Cigarettes   Start date: 02/21/1988   Quit date: 02/20/2010   Years since quitting: 11.9  Smokeless Tobacco Never    Goals Met:  Independence with exercise equipment Exercise tolerated well No report of concerns or symptoms today Strength training completed today  Goals Unmet:  Not Applicable  Comments: Pt able to follow exercise prescription today without complaint.  Will continue to monitor for progression.    Dr. Emily Filbert is Medical Director for [place].  Dr. Ottie Glazier is Medical Director for [facility] Pulmonary Rehabilitation.

## 2022-02-21 ENCOUNTER — Encounter: Payer: BLUE CROSS/BLUE SHIELD | Attending: Family Medicine | Admitting: *Deleted

## 2022-02-21 DIAGNOSIS — D869 Sarcoidosis, unspecified: Secondary | ICD-10-CM | POA: Insufficient documentation

## 2022-02-21 NOTE — Progress Notes (Signed)
Daily Session Note  Patient Details  Name: Brenda Ellison MRN: [id number] Date of Birth: 06-04-1960 Referring Provider:   Flowsheet Row Pulmonary Rehab from 12/15/2021 in [facility] Cardiac and Pulmonary Rehab  Referring Provider Thad Ranger, MD       Encounter Date: 02/21/2022  Check In:  Session Check In - 02/21/22 1036       Check-In   Supervising physician immediately available to respond to emergencies See telemetry face sheet for immediately available ER MD    Location [facility]-Cardiac & Pulmonary Rehab    Staff Present Heath Lark, RN, BSN, Kathaleen Maser, MS, ACSM CEP, Exercise Physiologist;Noah Tickle, BS, Exercise Physiologist    Virtual Visit No    Medication changes reported     No    Fall or balance concerns reported    No    Warm-up and Cool-down Performed on first and last piece of equipment    Resistance Training Performed Yes    VAD Patient? No    PAD/SET Patient? No      Pain Assessment   Currently in Pain? No/denies                Social History   Tobacco Use  Smoking Status Former   Packs/day: 1.00   Years: 22.00   Total pack years: 22.00   Types: Cigarettes   Start date: 02/21/1988   Quit date: 02/20/2010   Years since quitting: 12.0  Smokeless Tobacco Never    Goals Met:  Proper associated with RPD/PD & O2 Sat Independence with exercise equipment Exercise tolerated well No report of concerns or symptoms today  Goals Unmet:  Not Applicable  Comments: Pt able to follow exercise prescription today without complaint.  Will continue to monitor for progression.    Dr. Emily Filbert is Medical Director for [place].  Dr. Ottie Glazier is Medical Director for [facility] Pulmonary Rehabilitation.

## 2022-02-23 ENCOUNTER — Encounter: Payer: BLUE CROSS/BLUE SHIELD | Admitting: *Deleted

## 2022-02-23 DIAGNOSIS — D869 Sarcoidosis, unspecified: Secondary | ICD-10-CM | POA: Diagnosis not present

## 2022-02-23 NOTE — Progress Notes (Signed)
Daily Session Note  Patient Details  Name: Brenda Ellison MRN: [id number] Date of Birth: 03-25-1960 Referring Provider:   Flowsheet Row Pulmonary Rehab from 12/15/2021 in [facility] Cardiac and Pulmonary Rehab  Referring Provider Thad Ranger, MD       Encounter Date: 02/23/2022  Check In:  Session Check In - 02/23/22 1020       Check-In   Supervising physician immediately available to respond to emergencies See telemetry face sheet for immediately available ER MD    Location [facility]-Cardiac & Pulmonary Rehab    Staff Present Darlyne Russian, RN, Lorin Mercy, MS, ACSM CEP, Exercise Physiologist;Joseph Tessie Fass, [place]    Virtual Visit No    Medication changes reported     No    Fall or balance concerns reported    No    Warm-up and Cool-down Performed on first and last piece of equipment    Resistance Training Performed Yes    VAD Patient? No    PAD/SET Patient? No      Pain Assessment   Currently in Pain? No/denies                Social History   Tobacco Use  Smoking Status Former   Packs/day: 1.00   Years: 22.00   Total pack years: 22.00   Types: Cigarettes   Start date: 02/21/1988   Quit date: 02/20/2010   Years since quitting: 12.0  Smokeless Tobacco Never    Goals Met:  Independence with exercise equipment Exercise tolerated well No report of concerns or symptoms today Strength training completed today  Goals Unmet:  Not Applicable  Comments: Pt able to follow exercise prescription today without complaint.  Will continue to monitor for progression.    Dr. Emily Filbert is Medical Director for [place].  Dr. Ottie Glazier is Medical Director for [facility] Pulmonary Rehabilitation.

## 2022-02-28 ENCOUNTER — Encounter: Payer: BLUE CROSS/BLUE SHIELD | Admitting: *Deleted

## 2022-02-28 ENCOUNTER — Ambulatory Visit: Payer: Self-pay

## 2022-02-28 DIAGNOSIS — D869 Sarcoidosis, unspecified: Secondary | ICD-10-CM | POA: Diagnosis not present

## 2022-02-28 NOTE — Telephone Encounter (Signed)
  Chief Complaint: Weight gain of 5 lbs in 5 days. Mild SOB Symptoms: O2 91% Frequency: today Pertinent Negatives: Patient denies SOB Disposition: [] ED /[] Urgent Care (no appt availability in office) / [] Appointment(In office/virtual)/ []  [place] [facility]/ [] Home Care/ [] Refused Recommended Disposition /[] [place] [facility]/ [x]  Follow-up with PCP Additional Notes: PT was seen at rehab today and told she had gained 5 lbs in 5 days. Pt reports O2 sats of 91 (Pt is not a great historian.). PT states that she is mildly SOB. PT was unconvinced of the need for an office visit and wanted to delay it until Thursday. PT made appt for tomorrow. PT will go to Ed if she becomes SOB.

## 2022-02-28 NOTE — Telephone Encounter (Signed)
Reason for Disposition  Oxygen level (e.g., pulse oximetry) 91 to 94 percent  Answer Assessment - Initial Assessment Questions 1. RESPIRATORY STATUS: "Describe your breathing?" (e.g., wheezing, shortness of breath, unable to speak, severe coughing)      Mild 2. ONSET: "When did this breathing problem begin?"      *No Answer* 3. PATTERN "Does the difficult breathing come and go, or has it been constant since it started?"      COnstant 4. SEVERITY: "How bad is your breathing?" (e.g., mild, moderate, severe)    - MILD: No SOB at rest, mild SOB with walking, speaks normally in sentences, can lie down, no retractions, pulse < 100.    - MODERATE: SOB at rest, SOB with minimal exertion and prefers to sit, cannot lie down flat, speaks in phrases, mild retractions, audible wheezing, pulse 100-120.    - SEVERE: Very SOB at rest, speaks in single words, struggling to breathe, sitting hunched forward, retractions, pulse > 120      mild 5. RECURRENT SYMPTOM: "Have you had difficulty breathing before?" If Yes, ask: "When was the last time?" and "What happened that time?"       6. CARDIAC HISTORY: "Do you have any history of heart disease?" (e.g., heart attack, angina, bypass surgery, angioplasty)      Yes - SOB - biopsy 7. LUNG HISTORY: "Do you have any history of lung disease?"  (e.g., pulmonary embolus, asthma, emphysema)     yes 8. CAUSE: "What do you think is causing the breathing problem?"      Heart and lung problems 9. OTHER SYMPTOMS: "Do you have any other symptoms? (e.g., dizziness, runny nose, cough, chest pain, fever)      10. O2 SATURATION MONITOR:  "Do you use an oxygen saturation monitor (pulse oximeter) at home?" If Yes, ask: "What is your reading (oxygen level) today?" "What is your usual oxygen saturation reading?" (e.g., 95%)       91 %  11. PREGNANCY: "Is there any chance you are pregnant?" "When was your last menstrual period?"        12. TRAVEL: "Have you traveled out of the  country in the last month?" (e.g., travel history, exposures)  Protocols used: Breathing Difficulty-A-AH

## 2022-02-28 NOTE — Progress Notes (Signed)
Daily Session Note  Patient Details  Name: REANNAH TOTTEN MRN: [id number] Date of Birth: 05-20-1960 Referring Provider:   Flowsheet Row Pulmonary Rehab from 12/15/2021 in [facility] Cardiac and Pulmonary Rehab  Referring Provider Thad Ranger, MD       Encounter Date: 02/28/2022  Check In:  Session Check In - 02/28/22 1142       Check-In   Supervising physician immediately available to respond to emergencies See telemetry face sheet for immediately available ER MD    Location [facility]-Cardiac & Pulmonary Rehab    Staff Present Heath Lark, RN, BSN, CCRP;Jessica [place], MA, RCEP, CCRP, Bertram Gala, MS, ACSM CEP, Exercise Physiologist    Virtual Visit No    Medication changes reported     No    Fall or balance concerns reported    No    Warm-up and Cool-down Performed on first and last piece of equipment    Resistance Training Performed Yes    VAD Patient? No    PAD/SET Patient? No      Pain Assessment   Currently in Pain? No/denies                Social History   Tobacco Use  Smoking Status Former   Packs/day: 1.00   Years: 22.00   Total pack years: 22.00   Types: Cigarettes   Start date: 02/21/1988   Quit date: 02/20/2010   Years since quitting: 12.0  Smokeless Tobacco Never    Goals Met:  Proper associated with RPD/PD & O2 Sat Independence with exercise equipment Exercise tolerated well No report of concerns or symptoms today  Goals Unmet:  Not Applicable  Comments: Pt able to follow exercise prescription today without complaint.  Will continue to monitor for progression.    Dr. Emily Filbert is Medical Director for [place].  Dr. Ottie Glazier is Medical Director for [facility] Pulmonary Rehabilitation.

## 2022-03-01 ENCOUNTER — Other Ambulatory Visit: Payer: Self-pay | Admitting: Family Medicine

## 2022-03-01 ENCOUNTER — Ambulatory Visit: Payer: Medicaid Other | Admitting: Internal Medicine

## 2022-03-01 ENCOUNTER — Encounter: Payer: Self-pay | Admitting: Internal Medicine

## 2022-03-01 VITALS — BP 126/74 | HR 85 | Temp 98.1°F | Resp 18 | Ht 64.0 in | Wt 178.0 lb

## 2022-03-01 DIAGNOSIS — R635 Abnormal weight gain: Secondary | ICD-10-CM | POA: Diagnosis not present

## 2022-03-01 DIAGNOSIS — Z1231 Encounter for screening mammogram for malignant neoplasm of breast: Secondary | ICD-10-CM

## 2022-03-01 NOTE — Telephone Encounter (Signed)
FYI

## 2022-03-01 NOTE — Progress Notes (Signed)
Acute Office Visit  Subjective:     Patient ID: Brenda Ellison, female    DOB: 10-Jun-1960, 62 y.o.   MRN: [id number]  Chief Complaint  Patient presents with   Weight Gain    HPI Patient is in today for weight gain. Weight 178 pounds today on our scale, patient is reporting a 4 pounds weight gain since the beginning of December. She was reportedly 181 lbs yesterday at pulmonary rehab where she was told she needs to be checked out. Her weight yesterday does not appear to be documented. 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 She states that she is seen at pulmonary rehab on Tuesdays and Thursdays and weight is normally 173-174 pounds. She denies fluid or swelling in her legs, does note some abdominal swelling and feeling like she can't take a deep breath in, especially when laying flat. Denies abdominal pain although she did have some RUQ in the past and was told she has gallstones. She has 1-3 solid BM's a day and reporting no changes with urination. She has not eaten yet today but ate some soup and salad yesterday.  Review of Systems  Constitutional:  Negative for chills and fever.  Respiratory:  Positive for shortness of breath. Negative for cough and wheezing.   Cardiovascular:  Negative for chest pain and leg swelling.  Gastrointestinal:  Negative for abdominal pain, constipation, diarrhea, nausea and vomiting.        Objective:    BP 126/74   Pulse 85   Temp 98.1 F (36.7 C)   Resp 18   Ht 5\' 4"  (1.626 m)   Wt 178 lb (80.7 kg)   SpO2 97%   BMI 30.55 kg/m  BP Readings from Last 3 Encounters:  03/01/22 126/74  01/27/22 130/82  01/26/22 130/80   Wt Readings from Last 3 Encounters:  03/01/22 178 lb (80.7 kg)  01/27/22 174 lb 12.8 oz (79.3 kg)  01/26/22 174 lb (78.9 kg)      Physical Exam Constitutional:      Appearance: Normal appearance.  HENT:     Head: Normocephalic and atraumatic.  Eyes:     Conjunctiva/sclera: Conjunctivae normal.  Cardiovascular:      Rate and Rhythm: Normal rate and regular rhythm.  Pulmonary:     Effort: Pulmonary effort is normal.     Breath sounds: Normal breath sounds.  Abdominal:     General: Bowel sounds are normal. There is no distension.     Palpations: Abdomen is soft.     Tenderness: There is no abdominal tenderness. There is no right CVA tenderness, left CVA tenderness, guarding or rebound.  Musculoskeletal:     Right lower leg: No edema.     Left lower leg: No edema.  Skin:    General: Skin is warm and dry.  Neurological:     General: No focal deficit present.     Mental Status: She is alert. Mental status is at baseline.  Psychiatric:        Mood and Affect: Mood normal.        Behavior: Behavior normal.     No results found for any visits on 03/01/22.      Assessment & Plan:   1. Weight gain: No lower extremity swelling or fluid auscultated in lungs.  Patient does have a large abdomen but no fluid noted.  She was nontender on exam with normal bowel sounds.  She has no history of CHF.  She does have a  remote history of gallstones.  Discussed how rapid weight gain is usually due to fluid, however she does admit eating more over the holidays. Will check liver function and rule out CHF with BNP today. She is going back to pulmonary rehab tomorrow and she will let me know her weight.   - COMPLETE METABOLIC PANEL WITH GFR - B Nat Peptide  Return if symptoms worsen or fail to improve.  Teodora Medici, DO

## 2022-03-02 ENCOUNTER — Encounter: Payer: BLUE CROSS/BLUE SHIELD | Admitting: *Deleted

## 2022-03-02 DIAGNOSIS — D869 Sarcoidosis, unspecified: Secondary | ICD-10-CM

## 2022-03-02 LAB — COMPLETE METABOLIC PANEL WITH GFR
AG Ratio: 1.5 (calc) (ref 1.0–2.5)
ALT: 23 U/L (ref 6–29)
AST: 20 U/L (ref 10–35)
Albumin: 4 g/dL (ref 3.6–5.1)
Alkaline phosphatase (APISO): 117 U/L (ref 37–153)
BUN: 12 mg/dL (ref 7–25)
CO2: 27 mmol/L (ref 20–32)
Calcium: 9.5 mg/dL (ref 8.6–10.4)
Chloride: 107 mmol/L (ref 98–110)
Creat: 0.98 mg/dL (ref 0.50–1.03)
Globulin: 2.7 g/dL (calc) (ref 1.9–3.7)
Glucose, Bld: 134 mg/dL — ABNORMAL HIGH (ref 65–99)
Potassium: 4.2 mmol/L (ref 3.5–5.3)
Sodium: 142 mmol/L (ref 135–146)
Total Bilirubin: 0.4 mg/dL (ref 0.2–1.2)
Total Protein: 6.7 g/dL (ref 6.1–8.1)
eGFR: 66 mL/min/{1.73_m2} (ref >=60)

## 2022-03-02 LAB — BRAIN NATRIURETIC PEPTIDE: Brain Natriuretic Peptide: 41 pg/mL (ref <100)

## 2022-03-02 NOTE — Progress Notes (Signed)
Daily Session Note  Patient Details  Name: Brenda Ellison MRN: [id number] Date of Birth: [birth year] Referring Provider:   Flowsheet Row Pulmonary Rehab from 12/15/2021 in [facility] Cardiac and Pulmonary Rehab  Referring Provider Thad Ranger, MD       Encounter Date: 03/02/2022  Check In:  Session Check In - 03/02/22 1007       Check-In   Supervising physician immediately available to respond to emergencies See telemetry face sheet for immediately available ER MD    Location [facility]-Cardiac & Pulmonary Rehab    Staff Present Darlyne Russian, RN, Lorin Mercy, MS, ACSM CEP, Exercise Physiologist;Meredith Sherryll Burger, RN BSN;Joseph Tessie Fass, RCP,RRT,BSRT    Virtual Visit No    Medication changes reported     No    Fall or balance concerns reported    No    Warm-up and Cool-down Performed on first and last piece of equipment    Resistance Training Performed Yes    VAD Patient? No    PAD/SET Patient? No      Pain Assessment   Currently in Pain? No/denies                Social History   Tobacco Use  Smoking Status Former   Packs/day: 1.00   Years: 22.00   Total pack years: 22.00   Types: Cigarettes   Start date: 02/21/1988   Quit date: 02/20/2010   Years since quitting: 12.0  Smokeless Tobacco Never    Goals Met:  Independence with exercise equipment Exercise tolerated well No report of concerns or symptoms today Strength training completed today  Goals Unmet:  Not Applicable  Comments: Pt able to follow exercise prescription today without complaint.  Will continue to monitor for progression.    Dr. Emily Filbert is Medical Director for [place].  Dr. Ottie Glazier is Medical Director for [facility] Pulmonary Rehabilitation.

## 2022-03-07 ENCOUNTER — Encounter: Payer: BLUE CROSS/BLUE SHIELD | Admitting: *Deleted

## 2022-03-07 DIAGNOSIS — D869 Sarcoidosis, unspecified: Secondary | ICD-10-CM | POA: Diagnosis not present

## 2022-03-07 NOTE — Progress Notes (Signed)
Daily Session Note  Patient Details  Name: Brenda Ellison MRN: [id number] Date of Birth: 24-Jan-1961 Referring Provider:   Flowsheet Row Pulmonary Rehab from 12/15/2021 in [facility] Cardiac and Pulmonary Rehab  Referring Provider Thad Ranger, MD       Encounter Date: 03/07/2022  Check In:  Session Check In - 03/07/22 1036       Check-In   Supervising physician immediately available to respond to emergencies See telemetry face sheet for immediately available ER MD    Location [facility]-Cardiac & Pulmonary Rehab    Staff Present Renita Papa, RN BSN;Jessica Luan [place], MA, RCEP, CCRP, Bertram Gala, MS, ACSM CEP, Exercise Physiologist    Virtual Visit No    Medication changes reported     No    Fall or balance concerns reported    No    Warm-up and Cool-down Performed on first and last piece of equipment    Resistance Training Performed Yes    VAD Patient? No    PAD/SET Patient? No      Pain Assessment   Currently in Pain? No/denies                Social History   Tobacco Use  Smoking Status Former   Packs/day: 1.00   Years: 22.00   Total pack years: 22.00   Types: Cigarettes   Start date: 02/21/1988   Quit date: 02/20/2010   Years since quitting: 12.0  Smokeless Tobacco Never    Goals Met:  Independence with exercise equipment Exercise tolerated well No report of concerns or symptoms today Strength training completed today  Goals Unmet:  Not Applicable  Comments: Pt able to follow exercise prescription today without complaint.  Will continue to monitor for progression.    Dr. Emily Filbert is Medical Director for [place].  Dr. Ottie Glazier is Medical Director for [facility] Pulmonary Rehabilitation.

## 2022-03-09 ENCOUNTER — Encounter: Payer: BLUE CROSS/BLUE SHIELD | Admitting: *Deleted

## 2022-03-09 DIAGNOSIS — D869 Sarcoidosis, unspecified: Secondary | ICD-10-CM

## 2022-03-09 NOTE — Progress Notes (Signed)
Daily Session Note  Patient Details  Name: Brenda Ellison MRN: [id number] Date of Birth: May 15, 1960 Referring Provider:   Flowsheet Row Pulmonary Rehab from 12/15/2021 in [facility] Cardiac and Pulmonary Rehab  Referring Provider Thad Ranger, MD       Encounter Date: 03/09/2022  Check In:  Session Check In - 03/09/22 1006       Check-In   Supervising physician immediately available to respond to emergencies See telemetry face sheet for immediately available ER MD    Location [facility]-Cardiac & Pulmonary Rehab    Staff Present Darlyne Russian, RN, Lorin Mercy, MS, ACSM CEP, Exercise Physiologist;Joseph Tessie Fass, [place]    Virtual Visit No    Medication changes reported     No    Fall or balance concerns reported    No    Warm-up and Cool-down Performed on first and last piece of equipment    Resistance Training Performed Yes    VAD Patient? No    PAD/SET Patient? No      Pain Assessment   Currently in Pain? No/denies                Social History   Tobacco Use  Smoking Status Former   Packs/day: 1.00   Years: 22.00   Total pack years: 22.00   Types: Cigarettes   Start date: 02/21/1988   Quit date: 02/20/2010   Years since quitting: 12.0  Smokeless Tobacco Never    Goals Met:  Independence with exercise equipment Exercise tolerated well No report of concerns or symptoms today Strength training completed today  Goals Unmet:  Not Applicable  Comments: Pt able to follow exercise prescription today without complaint.  Will continue to monitor for progression.    Dr. Emily Filbert is Medical Director for [place].  Dr. Ottie Glazier is Medical Director for [facility] Pulmonary Rehabilitation.

## 2022-03-14 ENCOUNTER — Encounter: Payer: BLUE CROSS/BLUE SHIELD | Admitting: *Deleted

## 2022-03-14 DIAGNOSIS — D869 Sarcoidosis, unspecified: Secondary | ICD-10-CM

## 2022-03-14 NOTE — Progress Notes (Signed)
Daily Session Note  Patient Details  Name: Brenda Ellison MRN: [id number] Date of Birth: Jul 02, 1960 Referring Provider:   Flowsheet Row Pulmonary Rehab from 12/15/2021 in [facility] Cardiac and Pulmonary Rehab  Referring Provider Thad Ranger, MD       Encounter Date: 03/14/2022  Check In:  Session Check In - 03/14/22 1034       Check-In   Supervising physician immediately available to respond to emergencies See telemetry face sheet for immediately available ER MD    Location [facility]-Cardiac & Pulmonary Rehab    Staff Present Heath Lark, RN, BSN, CCRP;Laureen Owens Shark, BS, RRT, CPFT;Kara Maricela Bo, MS, ACSM CEP, Exercise Physiologist    Virtual Visit No    Medication changes reported     No    Fall or balance concerns reported    No    Warm-up and Cool-down Performed on first and last piece of equipment    Resistance Training Performed Yes    VAD Patient? No    PAD/SET Patient? No      Pain Assessment   Currently in Pain? No/denies                Social History   Tobacco Use  Smoking Status Former   Packs/day: 1.00   Years: 22.00   Total pack years: 22.00   Types: Cigarettes   Start date: 02/21/1988   Quit date: 02/20/2010   Years since quitting: 12.0  Smokeless Tobacco Never    Goals Met:  Proper associated with RPD/PD & O2 Sat Independence with exercise equipment Exercise tolerated well No report of concerns or symptoms today  Goals Unmet:  Not Applicable  Comments: Pt able to follow exercise prescription today without complaint.  Will continue to monitor for progression.    Dr. Emily Filbert is Medical Director for [place].  Dr. Ottie Glazier is Medical Director for [facility] Pulmonary Rehabilitation.

## 2022-03-15 ENCOUNTER — Encounter: Payer: Self-pay | Admitting: *Deleted

## 2022-03-15 DIAGNOSIS — D869 Sarcoidosis, unspecified: Secondary | ICD-10-CM

## 2022-03-15 NOTE — Progress Notes (Signed)
Pulmonary Individual Treatment Plan  Patient Details  Name: Brenda Ellison MRN: [id number] Date of Birth: 04/28/1960 Referring Provider:   Flowsheet Row Pulmonary Rehab from 12/15/2021 in [facility] Cardiac and Pulmonary Rehab  Referring Provider Thad Ranger, MD       Initial Encounter Date:  Flowsheet Row Pulmonary Rehab from 12/15/2021 in [facility] Cardiac and Pulmonary Rehab  Date 12/15/21       Visit Diagnosis: Sarcoidosis  Patient's Home Medications on Admission:  Current Outpatient Medications:    albuterol (VENTOLIN HFA) 108 (90 Base) MCG/ACT inhaler, Inhale 2 puffs into the lungs every 4 (four) hours as needed for wheezing or shortness of breath., Disp: 18 g, Rfl: 3   albuterol (VENTOLIN HFA) 108 (90 Base) MCG/ACT inhaler, , Disp: , Rfl:    atorvastatin (LIPITOR) 20 MG tablet, TAKE 1 TABLET BY MOUTH AT BEDTIME, Disp: 30 tablet, Rfl: 8   benzonatate (TESSALON) 100 MG capsule, Take 1-2 capsules (100-200 mg total) by mouth 3 (three) times daily as needed for cough., Disp: 30 capsule, Rfl: 1   blood glucose meter kit and supplies, Dispense based on patient and insurance preference. Use up to four times daily as directed. (FOR ICD-10 E10.9, E11.9)., Disp: 1 each, Rfl: 0   budesonide-formoterol (SYMBICORT) 160-4.5 MCG/ACT inhaler, Inhale 2 puffs into the lungs 2 (two) times daily., Disp: 1 each, Rfl: 0   celecoxib (CELEBREX) 100 MG capsule, Take 1 capsule by mouth 2 (two) times daily., Disp: , Rfl:    cetirizine (ZYRTEC ALLERGY) 10 MG tablet, 1 tablet Orally Once a day for 30 day(s), Disp: , Rfl:    Cholecalciferol (VITAMIN D3) 2000 UNITS capsule, Take 2,000 Units by mouth daily., Disp: , Rfl:    dapagliflozin propanediol (FARXIGA) 5 MG TABS tablet, Take 1 tablet (5 mg total) by mouth daily before breakfast., Disp: 30 tablet, Rfl: 5   fluticasone (FLONASE) 50 MCG/ACT nasal spray, Place 2 sprays into both nostrils daily., Disp: 16 g, Rfl: 6   gabapentin (NEURONTIN) 100 MG capsule, Take 100  mg by mouth 2 (two) times daily., Disp: , Rfl:    levocetirizine (XYZAL) 5 MG tablet, Take 1 tablet (5 mg total) by mouth every evening., Disp: 90 tablet, Rfl: 1   meclizine (ANTIVERT) 12.5 MG tablet, Take 1 tablet (12.5 mg total) by mouth 3 (three) times daily as needed for dizziness., Disp: 30 tablet, Rfl: 0   metFORMIN (GLUCOPHAGE) 500 MG tablet, Take 500 mg by mouth 2 (two) times daily with a meal., Disp: , Rfl:    montelukast (SINGULAIR) 10 MG tablet, Take 1 tablet (10 mg total) by mouth at bedtime., Disp: 30 tablet, Rfl: 3   pantoprazole (PROTONIX) 20 MG tablet, Take 1 tablet (20 mg total) by mouth 2 (two) times daily. For stomach protection with NSAIDs and GERD, Disp: 60 tablet, Rfl: 1   promethazine-dextromethorphan (PROMETHAZINE-DM) 6.25-15 MG/5ML syrup, Take 2.5-5 mLs by mouth 3 (three) times daily as needed for cough., Disp: 118 mL, Rfl: 1   tiZANidine (ZANAFLEX) 4 MG tablet, Take 1 tablet (4 mg total) by mouth every 8 (eight) hours as needed for muscle spasms (muscle tightness/spasms)., Disp: 30 tablet, Rfl: 1  Past Medical History: Past Medical History:  Diagnosis Date   Arthritis pain    Bilateral leg pain    Chronic nasal congestion    Diabetes mellitus without complication (HCC)    Dizziness of unknown cause    Heart murmur previously undiagnosed    Osteoarthritis, hand    Prediabetes 07/12/2016  Tobacco Use: Social History   Tobacco Use  Smoking Status Former   Packs/day: 1.00   Years: 22.00   Total pack years: 22.00   Types: Cigarettes   Start date: 02/21/1988   Quit date: 02/20/2010   Years since quitting: 12.0  Smokeless Tobacco Never    Labs: Review Flowsheet  More data exists      Latest Ref Rng & Units 06/15/2020 09/07/2020 06/23/2021 09/28/2021 12/26/2021  Labs for ITP Cardiac and Pulmonary Rehab  Cholestrol <200 mg/dL - - 161  - -  LDL (calc) mg/dL (calc) - - 69  - -  HDL-C > OR = 50 mg/dL - - 47  - -  Trlycerides <150 mg/dL - - 096  - -  Hemoglobin A1c  4.0 - 5.6 % 6.9  6.5  8.0  8.3  6.1      Pulmonary Assessment Scores:  Pulmonary Assessment Scores     Row Name 12/15/21 1423         ADL UCSD   SOB Score total 56     Rest 0     Walk 3     Stairs 5     Bath 3     Dress 3     Shop 4       CAT Score   CAT Score 15       mMRC Score   mMRC Score 1              UCSD: Self-administered rating of dyspnea associated with activities of daily living (ADLs) 6-point scale (0 = "not at all" to 5 = "maximal or unable to do because of breathlessness")  Scoring Scores range from 0 to 120.  Minimally important difference is 5 units  CAT: CAT can identify the health impairment of COPD patients and is better correlated with disease progression.  CAT has a scoring range of zero to 40. The CAT score is classified into four groups of low (less than 10), medium (10 - 20), high (21-30) and very high (31-40) based on the impact level of disease on health status. A CAT score over 10 suggests significant symptoms.  A worsening CAT score could be explained by an exacerbation, poor medication adherence, poor inhaler technique, or progression of COPD or comorbid conditions.  CAT MCID is 2 points  mMRC: mMRC (Modified Medical Research Council) Dyspnea Scale is used to assess the degree of baseline functional disability in patients of respiratory disease due to dyspnea. No minimal important difference is established. A decrease in score of 1 point or greater is considered a positive change.   Pulmonary Function Assessment:  Pulmonary Function Assessment - 12/12/21 1118       Breath   Shortness of Breath Yes;Limiting activity             Exercise Target Goals: Exercise Program Goal: Individual exercise prescription set using results from initial 6 min walk test and THRR while considering  patient's activity barriers and safety.   Exercise Prescription Goal: Initial exercise prescription builds to 30-45 minutes a day of aerobic  activity, 2-3 days per week.  Home exercise guidelines will be given to patient during program as part of exercise prescription that the participant will acknowledge.  Education: Aerobic Exercise: - Group verbal and visual presentation on the components of exercise prescription. Introduces F.I.T.T principle from ACSM for exercise prescriptions.  Reviews F.I.T.T. principles of aerobic exercise including progression. Written material given at graduation. Flowsheet Row Pulmonary Rehab from 03/09/2022 in  [facility] Cardiac and Pulmonary Rehab  Education need identified 12/15/21  Date 03/09/22  Educator [facility]  Instruction Review Code 1- Verbalizes Understanding       Education: Resistance Exercise: - Group verbal and visual presentation on the components of exercise prescription. Introduces F.I.T.T principle from ACSM for exercise prescriptions  Reviews F.I.T.T. principles of resistance exercise including progression. Written material given at graduation.    Education: Exercise & Equipment Safety: - Individual verbal instruction and demonstration of equipment use and safety with use of the equipment. Flowsheet Row Pulmonary Rehab from 03/09/2022 in [facility] Cardiac and Pulmonary Rehab  Date 12/12/21  Educator [facility]  Instruction Review Code 1- Verbalizes Understanding       Education: Exercise Physiology & General Exercise Guidelines: - Group verbal and written instruction with models to review the exercise physiology of the cardiovascular system and associated critical values. Provides general exercise guidelines with specific guidelines to those with heart or lung disease.  Flowsheet Row Pulmonary Rehab from 03/09/2022 in [facility] Cardiac and Pulmonary Rehab  Date 03/02/22  Educator [facility]  Instruction Review Code 1- Verbalizes Understanding       Education: Flexibility, Balance, Mind/Body Relaxation: - Group verbal and visual presentation with interactive activity on the components of exercise prescription.  Introduces F.I.T.T principle from ACSM for exercise prescriptions. Reviews F.I.T.T. principles of flexibility and balance exercise training including progression. Also discusses the mind body connection.  Reviews various relaxation techniques to help reduce and manage stress (i.e. Deep breathing, progressive muscle relaxation, and visualization). Balance handout provided to take home. Written material given at graduation.   Activity Barriers & Risk Stratification:  Activity Barriers & Cardiac Risk Stratification - 12/15/21 1456       Activity Barriers & Cardiac Risk Stratification   Activity Barriers Deconditioning;Shortness of Breath             6 Minute Walk:  6 Minute Walk     Row Name 12/15/21 1448         6 Minute Walk   Distance 1080 feet     Walk Time 6 minutes     # of Rest Breaks 0     MPH 2.05     METS 2.9     RPE 11     Perceived Dyspnea  2     VO2 Peak 10.15     Symptoms Yes (comment)     Comments SOB, Fatigue     Resting HR 60 bpm     Resting BP 118/62     Resting Oxygen Saturation  95 %     Exercise Oxygen Saturation  during 6 min walk 92 %     Max Ex. HR 96 bpm     Max Ex. BP 132/84     2 Minute Post BP 120/70       Interval HR   1 Minute HR 83     2 Minute HR 91     3 Minute HR 91     4 Minute HR 93     5 Minute HR 96     6 Minute HR 95     2 Minute Post HR 67     Interval Heart Rate? Yes       Interval Oxygen   Interval Oxygen? Yes     Baseline Oxygen Saturation % 95 %     1 Minute Oxygen Saturation % 95 %     1 Minute Liters of Oxygen 0 L  RA  2 Minute Oxygen Saturation % 92 %     2 Minute Liters of Oxygen 0 L  RA     3 Minute Oxygen Saturation % 94 %     3 Minute Liters of Oxygen 0 L  RA     4 Minute Oxygen Saturation % 95 %     4 Minute Liters of Oxygen 0 L  RA     5 Minute Oxygen Saturation % 94 %     5 Minute Liters of Oxygen 0 L  RA     6 Minute Oxygen Saturation % 92 %     6 Minute Liters of Oxygen 0 L  RA     2 Minute Post  Oxygen Saturation % 97 %     2 Minute Post Liters of Oxygen 0 L  RA             Oxygen Initial Assessment:  Oxygen Initial Assessment - 12/27/21 1040       Home Oxygen   Home Oxygen Device None    Sleep Oxygen Prescription None    Home Exercise Oxygen Prescription None    Home Resting Oxygen Prescription None    Compliance with Home Oxygen Use Yes      Intervention   Short Term Goals To learn and demonstrate proper pursed lip breathing techniques or other breathing techniques.     Long  Term Goals Exhibits proper breathing techniques, such as pursed lip breathing or other method taught during program session             Oxygen Re-Evaluation:  Oxygen Re-Evaluation     Row Name 12/27/21 1041 01/05/22 1008 02/02/22 1101 02/23/22 1008       Program Oxygen Prescription   Program Oxygen Prescription -- None None None      Home Oxygen   Home Oxygen Device -- None None None    Sleep Oxygen Prescription -- None None None    Home Exercise Oxygen Prescription -- None None None    Home Resting Oxygen Prescription -- None None None    Compliance with Home Oxygen Use -- Yes No --      Goals/Expected Outcomes   Short Term Goals -- To learn and demonstrate proper pursed lip breathing techniques or other breathing techniques.  To learn and demonstrate proper pursed lip breathing techniques or other breathing techniques. ;To learn and understand importance of monitoring SPO2 with pulse oximeter and demonstrate accurate use of the pulse oximeter.;To learn and understand importance of maintaining oxygen saturations>88% To learn and demonstrate proper pursed lip breathing techniques or other breathing techniques. ;To learn and understand importance of monitoring SPO2 with pulse oximeter and demonstrate accurate use of the pulse oximeter.;To learn and understand importance of maintaining oxygen saturations>88%    Long  Term Goals -- Exhibits proper breathing techniques, such as pursed lip  breathing or other method taught during program session Verbalizes importance of monitoring SPO2 with pulse oximeter and return demonstration;Maintenance of O2 saturations>88%;Exhibits proper breathing techniques, such as pursed lip breathing or other method taught during program session;Compliance with respiratory medication Verbalizes importance of monitoring SPO2 with pulse oximeter and return demonstration;Maintenance of O2 saturations>88%;Exhibits proper breathing techniques, such as pursed lip breathing or other method taught during program session;Compliance with respiratory medication    Comments Reviewed PLB technique with pt.  Talked about how it works and it's importance in maintaining their exercise saturations. Corrie DandyMary recently started rehab and reports doing well. She reports her shortness of  breath will get worse by "doing to much" - she feels this more right now as she is moving next Monday. She does not check her O2 at home - encouraged to use a pulse oximeter. Nyliah reports that she uses PLB and feels this helps [place] has not been using her inhalers.  She also has not used her nebulizer recently.  We talked about the importance of using her meds the right way.  She said she will work on getting back into the habit.  She has also not been using her PLB so we reviewed it again. Kana is doing well in rehab.  She has not gotten a pulse oximeter yet.  She wants to go get one along with weights.  She is back to using inhalers but still not sure if they are helping.    Goals/Expected Outcomes Short: Become more profiecient at using PLB. Long: Become independent at using PLB. Short: Become more profiecient at using PLB, consider getting a pulse oximeter  Long: Become independent at using PLB. Short: Get back to using meds and PLB Long Continue to improve compliance with meds Short: Get pulse oximeter Long: Contineu to use inhalers consistently             Oxygen Discharge (Final Oxygen  Re-Evaluation):  Oxygen Re-Evaluation - 02/23/22 1008       Program Oxygen Prescription   Program Oxygen Prescription None      Home Oxygen   Home Oxygen Device None    Sleep Oxygen Prescription None    Home Exercise Oxygen Prescription None    Home Resting Oxygen Prescription None      Goals/Expected Outcomes   Short Term Goals To learn and demonstrate proper pursed lip breathing techniques or other breathing techniques. ;To learn and understand importance of monitoring SPO2 with pulse oximeter and demonstrate accurate use of the pulse oximeter.;To learn and understand importance of maintaining oxygen saturations>88%    Long  Term Goals Verbalizes importance of monitoring SPO2 with pulse oximeter and return demonstration;Maintenance of O2 saturations>88%;Exhibits proper breathing techniques, such as pursed lip breathing or other method taught during program session;Compliance with respiratory medication    Comments Barrie is doing well in rehab.  She has not gotten a pulse oximeter yet.  She wants to go get one along with weights.  She is back to using inhalers but still not sure if they are helping.    Goals/Expected Outcomes Short: Get pulse oximeter Long: Contineu to use inhalers consistently             Initial Exercise Prescription:  Initial Exercise Prescription - 12/15/21 1400       Date of Initial Exercise RX and Referring Provider   Date 12/15/21    Referring Provider Thad Ranger, MD      Oxygen   Maintain Oxygen Saturation 88% or higher      Treadmill   MPH 1.8    Grade 0.5    Minutes 15    METs 2.5      Recumbant Bike   Level 1    RPM 50    Watts 22    Minutes 15    METs 2.9      NuStep   Level 2    SPM 80    Minutes 15    METs 2.9      T5 Nustep   Level 1    SPM 80    Minutes 15    METs 2.9  Prescription Details   Frequency (times per week) 2    Duration Progress to 30 minutes of continuous aerobic without signs/symptoms of physical  distress      Intensity   THRR 40-80% of Max Heartrate 100-140    Ratings of Perceived Exertion 11-13    Perceived Dyspnea 0-4      Progression   Progression Continue to progress workloads to maintain intensity without signs/symptoms of physical distress.      Resistance Training   Training Prescription Yes    Weight 3 lb    Reps 10-15             Perform Capillary Blood Glucose checks as needed.  Exercise Prescription Changes:   Exercise Prescription Changes     Row Name 12/15/21 1400 01/16/22 1100 01/30/22 1300 02/07/22 0900 02/15/22 1300     Response to Exercise   Blood Pressure (Admit) 118/62 118/72 122/68 -- 132/68   Blood Pressure (Exercise) 132/84 134/70 134/82 -- --   Blood Pressure (Exit) 120/70 122/82 126/64 -- 98/60   Heart Rate (Admit) 60 bpm 69 bpm 63 bpm -- 74 bpm   Heart Rate (Exercise) 96 bpm 96 bpm 94 bpm -- 98 bpm   Heart Rate (Exit) 67 bpm 69 bpm 72 bpm -- 72 bpm   Oxygen Saturation (Admit) 95 % 95 % 98 % -- 98 %   Oxygen Saturation (Exercise) 92 % 89 % 91 % -- 94 %   Oxygen Saturation (Exit) 97 % 98 % 95 % -- 94 %   Rating of Perceived Exertion (Exercise) 11 15 15  -- 13   Perceived Dyspnea (Exercise) 2 3 3  -- 1   Symptoms SOB, Fatigue SOB SOB -- SOB   Comments Results -- -- -- --   Duration -- Progress to 30 minutes of  aerobic without signs/symptoms of physical distress Progress to 30 minutes of  aerobic without signs/symptoms of physical distress -- Continue with 30 min of aerobic exercise without signs/symptoms of physical distress.   Intensity -- THRR unchanged THRR unchanged -- THRR unchanged     Progression   Progression -- Continue to progress workloads to maintain intensity without signs/symptoms of physical distress. Continue to progress workloads to maintain intensity without signs/symptoms of physical distress. -- Continue to progress workloads to maintain intensity without signs/symptoms of physical distress.   Average METs -- 1.88  2.53 -- 2.72     Resistance Training   Training Prescription -- Yes Yes -- Yes   Weight -- 3 lb 3 lb -- 3 lb   Reps -- 10-15 10-15 -- 10-15     Interval Training   Interval Training -- No No -- No     Treadmill   MPH -- 0.6 -- -- --   Grade -- 0.5 -- -- --   Minutes -- 15 -- -- --   METs -- 1.5 -- -- --     Recumbant Bike   Level -- 1 1 -- --   Watts -- 10 18 -- --   Minutes -- 15 15 -- --     NuStep   Level -- 2 3 -- 3   Minutes -- 15 15 -- 15   METs -- 2.1 2.6 -- 2.8     T5 Nustep   Level -- 1 1 -- --   Minutes -- 15 15 -- --   METs -- 1.9 1.9 -- --     Track   Laps -- 19 30 -- 30  Minutes -- 15 15 -- 15   METs -- 2.03 2.63 -- 2.63     Home Exercise Plan   Plans to continue exercise at -- -- -- Home (comment)  walking, staff videos Home (comment)  walking, staff videos   Frequency -- -- -- Add 3 additional days to program exercise sessions. Add 3 additional days to program exercise sessions.   Initial Home Exercises Provided -- -- -- 02/07/22 02/07/22     Oxygen   Maintain Oxygen Saturation -- 88% or higher 88% or higher -- 88% or higher    Row Name 02/27/22 1400 03/13/22 1200           Response to Exercise   Blood Pressure (Admit) 122/72 102/60      Blood Pressure (Exit) 118/62 110/60      Heart Rate (Admit) 73 bpm 75 bpm      Heart Rate (Exercise) 95 bpm 95 bpm      Heart Rate (Exit) 70 bpm 70 bpm      Oxygen Saturation (Admit) 96 % 95 %      Oxygen Saturation (Exercise) 89 % 94 %      Oxygen Saturation (Exit) 97 % 96 %      Rating of Perceived Exertion (Exercise) 13 13      Perceived Dyspnea (Exercise) 1 1      Symptoms SOB, knee pain SOB      Duration Continue with 30 min of aerobic exercise without signs/symptoms of physical distress. Continue with 30 min of aerobic exercise without signs/symptoms of physical distress.      Intensity THRR unchanged THRR unchanged        Progression   Progression Continue to progress workloads to maintain  intensity without signs/symptoms of physical distress. Continue to progress workloads to maintain intensity without signs/symptoms of physical distress.      Average METs 2.86 2.54        Resistance Training   Training Prescription Yes Yes      Weight 3 lb 3 lb      Reps 10-15 10-15        Interval Training   Interval Training No No        Recumbant Bike   Level -- 3      Watts -- 15      Minutes -- 15      METs -- 2.59        NuStep   Level 3 4      Minutes 15 15      METs 3.3 2.7        T5 Nustep   Level 3 3      Minutes 15 30      METs 2 1.7        Track   Laps 42 60      Minutes 15 15      METs 3.28 4.26        Home Exercise Plan   Plans to continue exercise at Home (comment)  walking, staff videos Home (comment)  walking, staff videos      Frequency Add 3 additional days to program exercise sessions. Add 3 additional days to program exercise sessions.      Initial Home Exercises Provided 02/07/22 02/07/22        Oxygen   Maintain Oxygen Saturation 88% or higher 88% or higher               Exercise Comments:   Exercise Comments  Row Name 12/27/21 1040           Exercise Comments First full day of exercise!  Patient was oriented to gym and equipment including functions, settings, policies, and procedures.  Patient's individual exercise prescription and treatment plan were reviewed.  All starting workloads were established based on the results of the 6 minute walk test done at initial orientation visit.  The plan for exercise progression was also introduced and progression will be customized based on patient's performance and goals.                Exercise Goals and Review:   Exercise Goals     Row Name 12/15/21 1501             Exercise Goals   Increase Physical Activity Yes       Intervention Provide advice, education, support and counseling about physical activity/exercise needs.;Develop an individualized exercise prescription for  aerobic and resistive training based on initial evaluation findings, risk stratification, comorbidities and participant's personal goals.       Expected Outcomes Short Term: Attend rehab on a regular basis to increase amount of physical activity.;Long Term: Add in home exercise to make exercise part of routine and to increase amount of physical activity.;Long Term: Exercising regularly at least 3-5 days a week.       Increase Strength and Stamina Yes       Intervention Provide advice, education, support and counseling about physical activity/exercise needs.;Develop an individualized exercise prescription for aerobic and resistive training based on initial evaluation findings, risk stratification, comorbidities and participant's personal goals.       Expected Outcomes Short Term: Increase workloads from initial exercise prescription for resistance, speed, and METs.;Long Term: Improve cardiorespiratory fitness, muscular endurance and strength as measured by increased METs and functional capacity (6MWT);Short Term: Perform resistance training exercises routinely during rehab and add in resistance training at home       Able to understand and use rate of perceived exertion (RPE) scale Yes       Intervention Provide education and explanation on how to use RPE scale       Expected Outcomes Short Term: Able to use RPE daily in rehab to express subjective intensity level;Long Term:  Able to use RPE to guide intensity level when exercising independently       Able to understand and use Dyspnea scale Yes       Intervention Provide education and explanation on how to use Dyspnea scale       Expected Outcomes Long Term: Able to use Dyspnea scale to guide intensity level when exercising independently;Short Term: Able to use Dyspnea scale daily in rehab to express subjective sense of shortness of breath during exertion       Knowledge and understanding of Target Heart Rate Range (THRR) Yes       Intervention Provide  education and explanation of THRR including how the numbers were predicted and where they are located for reference       Expected Outcomes Short Term: Able to state/look up THRR;Long Term: Able to use THRR to govern intensity when exercising independently;Short Term: Able to use daily as guideline for intensity in rehab       Able to check pulse independently Yes       Intervention Provide education and demonstration on how to check pulse in carotid and radial arteries.;Review the importance of being able to check your own pulse for safety during independent exercise  Expected Outcomes Short Term: Able to explain why pulse checking is important during independent exercise;Long Term: Able to check pulse independently and accurately       Understanding of Exercise Prescription Yes       Intervention Provide education, explanation, and written materials on patient's individual exercise prescription       Expected Outcomes Short Term: Able to explain program exercise prescription;Long Term: Able to explain home exercise prescription to exercise independently                Exercise Goals Re-Evaluation :  Exercise Goals Re-Evaluation     Row Name 12/27/21 1040 01/05/22 0959 01/16/22 1133 01/30/22 1356 02/02/22 1048     Exercise Goal Re-Evaluation   Exercise Goals Review Able to understand and use rate of perceived exertion (RPE) scale;Knowledge and understanding of Target Heart Rate Range (THRR);Able to understand and use Dyspnea scale;Able to check pulse independently;Understanding of Exercise Prescription Able to understand and use rate of perceived exertion (RPE) scale;Knowledge and understanding of Target Heart Rate Range (THRR);Able to understand and use Dyspnea scale;Able to check pulse independently;Understanding of Exercise Prescription Increase Physical Activity;Increase Strength and Stamina;Understanding of Exercise Prescription Increase Physical Activity;Increase Strength and  Stamina;Understanding of Exercise Prescription Increase Physical Activity;Increase Strength and Stamina;Understanding of Exercise Prescription   Comments Reviewed RPE scale, THR and program prescription with pt today.  Pt voiced understanding and was given a copy of goals to take home. Jennessa reports doing well in rehab, but reports not working out at home right now becuase she is moving next Monday so she has been active. She has not met with EP yet to go over home exercise. Shronda has had a good start to rehab the last couple of weeks. She was able to complete 19 laps on the track, she did try the treadmill which was more of a challenge for her and was not able to pace as fast as she was able to walk at a 0.6 mph speed. If she wants to walk on the treadmill, we hope to see that improve over time. The other machines she worked on she worked at her initial exercise prescription well. Will continue to monitor. Jamiee is doing well in rehab. She recently improved her overall average MET level to 2.53 METs. She also increased her number of laps walked on the track to 30 laps. She improved to level 3 on the T4 as well. We will continue to monitor her progress in the program. Dreama is doing well in rehab.  She is not doing her home exercise yet.  She was waiting til she moved, but now that she has moved she is still not going.  We talked about getting up from TV and go for walk . She does feel liek her strength and stamina are improving.   Expected Outcomes Short: Use RPE daily to regulate intensity. Long: Follow program prescription in THR. Short: Meet with EP to go over home exercise Long: Follow program prescription in THR. Short: Continue to increase speed on treadmill Long: Continue to build up overall strength and stamina Short: Continue to push for more laps on the track. Long: Continue to increase strength and stamina. Short: Start to walk more in rehab Long: Continue to improve stamina    Row Name 02/07/22 0950  02/15/22 1325 02/23/22 0958 02/27/22 1422 03/13/22 1209     Exercise Goal Re-Evaluation   Exercise Goals Review Increase Physical Activity;Increase Strength and Stamina;Understanding of Exercise Prescription;Able to understand and use  rate of perceived exertion (RPE) scale;Knowledge and understanding of Target Heart Rate Range (THRR);Able to understand and use Dyspnea scale;Able to check pulse independently Increase Physical Activity;Increase Strength and Stamina;Understanding of Exercise Prescription Increase Physical Activity;Increase Strength and Stamina;Understanding of Exercise Prescription Increase Physical Activity;Increase Strength and Stamina;Understanding of Exercise Prescription Increase Physical Activity;Increase Strength and Stamina;Understanding of Exercise Prescription   Comments Reviewed home exercise with pt today.  Pt plans to walk and use staff videos at home for exercise.  Reviewed THR, pulse, RPE, sign and symptoms, pulse oximetery and when to call 911 or MD.  Also discussed weather considerations and indoor options.  Pt voiced understanding.  We also talked about getting a pulse oximeter to monitor at home. Armya is doing well in rehab.  She is up to 30 laps now!!  We will encourage her to try more weights too.  We will continue to montior her progress. Oluwaseyi is doing well in rehab.  She went for a walk for the first time yesterday since it was a little warmer than it has been.  She has not noticed much change in her stamina but she thinks that it more related to her age versus breathing.  She is doing more and actually feels like going for walks on her off days. Jamacia is doing well in rehab. She increased her overall average MET level to 2.86 METs. She also was able to improve to level 3 on the T5, and she walked up to 42 laps on the track. We will continue to monitor her progress in the program. [place] is continuing to do well in rehab. She did walk a whole 60 laps on the track which is the  most she has done so far! She increased to level 4 on the T4 Nustep and up to level 3 on the recumbent bike. We hope to see her increase overall watts. All O2 saturations are staying above 88%. Will continue to monitor.   Expected Outcomes Short: Start to add in walking at home and get pulse oximeter Long: Continue to improve stamina Short; Try 4 lb weights Long; Continue to improve stamina Short: Continue to walk more on her off days.  Long: Conitnue to improve stamina. Short: Consistently walk above 30 laps on the track. Long: Continue to improve strength and stamina. Short: Continue to increase watts on recumbent bike Long: Continue to increase overall stamina and MET level            Discharge Exercise Prescription (Final Exercise Prescription Changes):  Exercise Prescription Changes - 03/13/22 1200       Response to Exercise   Blood Pressure (Admit) 102/60    Blood Pressure (Exit) 110/60    Heart Rate (Admit) 75 bpm    Heart Rate (Exercise) 95 bpm    Heart Rate (Exit) 70 bpm    Oxygen Saturation (Admit) 95 %    Oxygen Saturation (Exercise) 94 %    Oxygen Saturation (Exit) 96 %    Rating of Perceived Exertion (Exercise) 13    Perceived Dyspnea (Exercise) 1    Symptoms SOB    Duration Continue with 30 min of aerobic exercise without signs/symptoms of physical distress.    Intensity THRR unchanged      Progression   Progression Continue to progress workloads to maintain intensity without signs/symptoms of physical distress.    Average METs 2.54      Resistance Training   Training Prescription Yes    Weight 3 lb    Reps  10-15      Interval Training   Interval Training No      Recumbant Bike   Level 3    Watts 15    Minutes 15    METs 2.59      NuStep   Level 4    Minutes 15    METs 2.7      T5 Nustep   Level 3    Minutes 30    METs 1.7      Track   Laps 60    Minutes 15    METs 4.26      Home Exercise Plan   Plans to continue exercise at Home (comment)    walking, staff videos   Frequency Add 3 additional days to program exercise sessions.    Initial Home Exercises Provided 02/07/22      Oxygen   Maintain Oxygen Saturation 88% or higher             Nutrition:  Target Goals: Understanding of nutrition guidelines, daily intake of sodium 1500mg , cholesterol 200mg , calories 30% from fat and 7% or less from saturated fats, daily to have 5 or more servings of fruits and vegetables.  Education: All About Nutrition: -Group instruction provided by verbal, written material, interactive activities, discussions, models, and posters to present general guidelines for heart healthy nutrition including fat, fiber, MyPlate, the role of sodium in heart healthy nutrition, utilization of the nutrition label, and utilization of this knowledge for meal planning. Follow up email sent as well. Written material given at graduation. Flowsheet Row [facility]  Date 01/19/22  [nutrition 2]  Educator [facility]  Instruction Review Code 1- Verbalizes Understanding       Biometrics:  Pre Biometrics - 12/15/21 1501       Pre Biometrics   Height 5\' 4"  (1.626 m)    Weight 174 lb 4.8 oz (79.1 kg)    Waist Circumference 44.5 inches    Hip Circumference 42.5 inches    Waist to Hip Ratio 1.05 %    BMI (Calculated) 29.9    Single Leg Stand 11.9 seconds   R             Nutrition Therapy Plan and Nutrition Goals:  Nutrition Therapy & Goals - 12/15/21 1158       Nutrition Therapy   Diet Heart healthy, low Na, T2DM MNT    Drug/Food Interactions Statins/Certain Fruits    Protein (specify units) 90-100g    Fiber 25 grams    Whole Grain Foods 3 servings    Saturated Fats 14 max. grams    Fruits and Vegetables 8 servings/day    Sodium 2 grams      Personal Nutrition Goals   Nutrition Goal ST: read over materials, practice MyPlate guidelines, include balanced snacks during the day LT: Achieve and maintain  A1C <7, eat consistent meals/snacks during the day (about 1-2 servings of CHO per snack and 3-5 servings of CHO per meals), include protein, fiber, and healthy fat at most meals/snacks    Comments 62 y.o. F admitted to pulmonary rehab for sarcoidosis. PMHx includes T2DM, GERD, DDD, HLD, osteoarthritis, former smoker. Relevant medications includes lipitor, vit D3, meclizine, metformin, pantoprazole, tizanidine. Last A1C 09/28/21 was 8.3. Corrie DandyMary had previously seen some regarding diabetes in nutrition this past summer, but has not been back. Discussed general heart healthy eating and T2DM MNT include pairing foods, quality and quantity of CHOs, eating consistently, hydration,  and exercise. Ulyssa feels that she does not eat very consistently right now - discussed some snacks that she could include like an apple with peanut butter.      Intervention Plan   Intervention Prescribe, educate and counsel regarding individualized specific dietary modifications aiming towards targeted core components such as weight, hypertension, lipid management, diabetes, heart failure and other comorbidities.;Nutrition handout(s) given to patient.    Expected Outcomes Short Term Goal: Understand basic principles of dietary content, such as calories, fat, sodium, cholesterol and nutrients.;Short Term Goal: A plan has been developed with personal nutrition goals set during dietitian appointment.;Long Term Goal: Adherence to prescribed nutrition plan.             Nutrition Assessments:  MEDIFICTS Score Key: ?70 Need to make dietary changes  40-70 Heart Healthy Diet ? 40 Therapeutic Level Cholesterol Diet  Flowsheet Row Pulmonary Rehab from 12/15/2021 in [facility]monary Re[facility]  Picture Your Plate Total Score on Admission 71      Picture Your Plate Scores: <82 Unhealthy dietary pattern with much room for improvement. 41-50 Dietary pattern unlikely to meet recommendations for good health and room for  improvement. 51-60 More healthful dietary pattern, with some room for improvement.  >60 Healthy dietary pattern, although there may be some specific behaviors that could be improved.   Nutrition Goals Re-Evaluation:  Nutrition Goals Re-Evaluation     Butler Name 01/05/22 1009 02/02/22 1051 02/23/22 1002         Goals   Nutrition Goal ST: read over materials, practice MyPlate guidelines, include balanced snacks during the day LT: Achieve and maintain A1C <7, eat consistent meals/snacks during the day (about 1-2 servings of CHO per snack and 3-5 servings of CHO per meals), include protein, fiber, and healthy fat at most meals/snacks ST: read over materials, practice MyPlate guidelines, include balanced snacks during the day LT: Achieve and maintain A1C <7, eat consistent meals/snacks during the day (about 1-2 servings of CHO per snack and 3-5 servings of CHO per meals), include protein, fiber, and healthy fat at most meals/snacks Short: Add back in more fruit that is unsweetened Long: Continue to work on [place] reports doing well with her nutrition; she reports no changes since the last time we spoke. Encouraged to focus on the short term goals we addressed when we first spoke. Reviewed general heart healthy eating. Maguire is doing well in rehab.  She has been watching her sodium and sugar closely.  She is now using Splenda versus sugar.  She has not been eating fruits and vegetables as she does not like to put her teeth in.  We talked about eating more canned stuff to help but to rinse them off.  She has canned fruit but been avoiding it.  We talked about getting it in fruit juice versus syrup to help with sugar.  We also talked about making sure she balances out with protein. She is also eating frank and beans a lot and we talked about how much sugar is in that. Modest is doing well.  She is wearing her teeth now.  She says they are feeling better.  She is still not eating with them in  as she cannot feel them as well when eating.  She says they feel really hard when she chews especially on the bottom.  She has continued to work on increasing her fruit.  She is now shopping for the better canned fruit but had  a hard time finding it.  We talked about trying to find canned fruit in its own juice.     Expected Outcome ST: read over materials, practice MyPlate guidelines, include balanced snacks during the day LT: Achieve and maintain A1C <7, eat consistent meals/snacks during the day (about 1-2 servings of CHO per snack and 3-5 servings of CHO per meals), include protein, fiber, and healthy fat at most meals/snacks Short: Add back in more fruit that is unsweetened Long: Continue to work on improving diet Short; Try canned fruit in its own juice Long: Conitnue to add in more protein              Nutrition Goals Discharge (Final Nutrition Goals Re-Evaluation):  Nutrition Goals Re-Evaluation - 02/23/22 1002       Goals   Nutrition Goal Short: Add back in more fruit that is unsweetened Long: Continue to work on improving diet    Comment Evelisse is doing well.  She is wearing her teeth now.  She says they are feeling better.  She is still not eating with them in as she cannot feel them as well when eating.  She says they feel really hard when she chews especially on the bottom.  She has continued to work on increasing her fruit.  She is now shopping for the better canned fruit but had a hard time finding it.  We talked about trying to find canned fruit in its own juice.    Expected Outcome Short; Try canned fruit in its own juice Long: Conitnue to add in more protein             Psychosocial: Target Goals: Acknowledge presence or absence of significant depression and/or stress, maximize coping skills, provide positive support system. Participant is able to verbalize types and ability to use techniques and skills needed for reducing stress and depression.   Education: Stress,  Anxiety, and Depression - Group verbal and visual presentation to define topics covered.  Reviews how body is impacted by stress, anxiety, and depression.  Also discusses healthy ways to reduce stress and to treat/manage anxiety and depression.  Written material given at graduation. Flowsheet Row Pulmonary Rehab from 03/09/2022 in [facility] Cardiac and Pulmonary Rehab  Date 02/23/22  Educator [facility]  Instruction Review Code 1- [employer] Understanding       Education: Sleep Hygiene -Provides group verbal and written instruction about how sleep can affect your health.  Define sleep hygiene, discuss sleep cycles and impact of sleep habits. Review good sleep hygiene tips.    Initial Review & Psychosocial Screening:  Initial Psych Review & Screening - 12/12/21 1119       Initial Review   Current issues with None Identified      Family Dynamics   Good Support System? Yes    Comments Verena can look to her sister inlaw and sons for support. She has church family and pastor for support as well. The normal stresses of daily life are her only stressors.      Barriers   Psychosocial barriers to participate in program The patient should benefit from training in stress management and relaxation.      Screening Interventions   Interventions Encouraged to exercise;To provide support and resources with identified psychosocial needs;Provide feedback about the scores to participant    Expected Outcomes Short Term goal: Utilizing psychosocial counselor, staff and physician to assist with identification of specific Stressors or current issues interfering with healing process. Setting desired goal for each stressor  or current issue identified.;Long Term Goal: Stressors or current issues are controlled or eliminated.;Short Term goal: Identification and review with participant of any Quality of Life or Depression concerns found by scoring the questionnaire.;Long Term goal: The participant improves quality of Life and  PHQ9 Scores as seen by post scores and/or verbalization of changes             Quality of Life Scores:  Scores of 19 and below usually indicate a poorer quality of life in these areas.  A difference of  2-3 points is a clinically meaningful difference.  A difference of 2-3 points in the total score of the Quality of Life Index has been associated with significant improvement in overall quality of life, self-image, physical symptoms, and general health in studies assessing change in quality of life.  PHQ-9: Review Flowsheet  More data exists      03/01/2022 01/27/2022 01/26/2022 01/23/2022 01/19/2022  Depression screen PHQ 2/9  Decreased Interest 0 0 0 0 0  Down, Depressed, Hopeless 0 0 0 0 0  PHQ - 2 Score 0 0 0 0 0  Altered sleeping 0 0 0 0 0  Tired, decreased energy 0 0 0 0 1  Change in appetite 0 0 0 0 0  Feeling bad or failure about yourself  0 0 0 0 0  Trouble concentrating 0 0 0 0 1  Moving slowly or fidgety/restless 0 0 0 0 0  Suicidal thoughts 0 0 0 0 0  PHQ-9 Score 0 0 0 0 2  Difficult doing work/chores Not difficult at all Not difficult at all Not difficult at all Not difficult at all -   Interpretation of Total Score  Total Score Depression Severity:  1-4 = Minimal depression, 5-9 = Mild depression, 10-14 = Moderate depression, 15-19 = Moderately severe depression, 20-27 = Severe depression   Psychosocial Evaluation and Intervention:  Psychosocial Evaluation - 12/12/21 1122       Psychosocial Evaluation & Interventions   Interventions Encouraged to exercise with the program and follow exercise prescription;Relaxation education;Stress management education    Comments Islay can look to her sister inlaw and sons for support. She has church family and pastor for support as well. The normal stresses of daily life are her only stressors.    Expected Outcomes Short: Start [facility] to help with mood. Long: Maintain a healthy mental state.    Continue Psychosocial Services   Follow up required by staff             Psychosocial Re-Evaluation:  Psychosocial Re-Evaluation     Row Name 01/05/22 1001 01/19/22 0955 02/02/22 1049 02/23/22 1000       Psychosocial Re-Evaluation   Current issues with Current Stress Concerns Current Stress Concerns Current Stress Concerns Current Stress Concerns    Comments Vernal reports moving next Monday, she reports no other stressors at this time. She reports that she will sit down and take a minute to breathe to help with stress. Arianah reports sleeping well. Sheray moved last week and is excited for her new house!!  Reviewed patient health questionnaire (PHQ-9) with patient for follow up. Previously, patients score indicated signs/symptoms of depression.  Reviewed to see if patient is improving symptom wise while in program.  Score improved and patient states that it is because they have moved into the house she has always wanted and feeling better overall. Libby is doing well mentally.  She is in her new house and liking it!  She no longer  has any major stressors going on.  She sleeps well overall.   She can tell rehab is helping her feel better overall. Corrie DandyMary is doing well in rehab. She feels good mentally.  She got her new teeth dentures yesterday and she is getting used to them.  She is still doing well in her new house.  She enjoyed celebrating the holidays and birthday parties in it!  She is still sleeping well.    Expected Outcomes ST: continue to exercise for mental health boost LT: maintain posisitve attitude Short: Continue to attend [facility] regularly for regular exercise and social engagement. Long: Continue to improve symptoms and manage a positive mental state. Short: Continue to attend [facility] regularly for regular exercise and social engagement. Long: Continue to improve symptoms and manage a positive mental state. Short: Continue to exercise on off days for mental boost Long: Continue to stay positive    Interventions --  Encouraged to attend Pulmonary Rehabilitation for the exercise;Stress management education Encouraged to attend Pulmonary Rehabilitation for the exercise Encouraged to attend Pulmonary Rehabilitation for the exercise    Continue Psychosocial Services  Follow up required by staff Follow up required by staff -- Follow up required by staff             Psychosocial Discharge (Final Psychosocial Re-Evaluation):  Psychosocial Re-Evaluation - 02/23/22 1000       Psychosocial Re-Evaluation   Current issues with Current Stress Concerns    Comments Corrie DandyMary is doing well in rehab. She feels good mentally.  She got her new teeth dentures yesterday and she is getting used to them.  She is still doing well in her new house.  She enjoyed celebrating the holidays and birthday parties in it!  She is still sleeping well.    Expected Outcomes Short: Continue to exercise on off days for mental boost Long: Continue to stay positive    Interventions Encouraged to attend Pulmonary Rehabilitation for the exercise    Continue Psychosocial Services  Follow up required by staff             Education: Education Goals: Education classes will be provided on a weekly basis, covering required topics. Participant will state understanding/return demonstration of topics presented.  Learning Barriers/Preferences:  Learning Barriers/Preferences - 12/12/21 1118       Learning Barriers/Preferences   Learning Barriers None    Learning Preferences None             General Pulmonary Education Topics:  Infection Prevention: - Provides verbal and written material to individual with discussion of infection control including proper hand washing and proper equipment cleaning during exercise session. Flowsheet Row [facility]  Date 12/12/21  [facility]  Instruction Review Code 1- Verbalizes Understanding       Falls Prevention: - Provides verbal and  written material to individual with discussion of falls prevention and safety. Flowsheet Row [facility]monary [facility]  Date 12/12/21  [facility]  Instruction Review Code 1- Verbalizes Understanding       Chronic Lung Disease Review: - Group verbal instruction with posters, models, PowerPoint presentations and videos,  to review new updates, new respiratory medications, new advancements in procedures and treatments. Providing information on websites and "800" numbers for continued self-education. Includes information about supplement oxygen, available portable oxygen systems, continuous and intermittent flow rates, oxygen safety, concentrators, and Medicare reimbursement for oxygen. Explanation of Pulmonary Drugs, including class,  frequency, complications, importance of spacers, rinsing mouth after steroid MDI's, and proper cleaning methods for nebulizers. Review of basic lung anatomy and physiology related to function, structure, and complications of lung disease. Review of risk factors. Discussion about methods for diagnosing sleep apnea and types of masks and machines for OSA. Includes a review of the use of types of environmental controls: home humidity, furnaces, filters, dust mite/pet prevention, HEPA vacuums. Discussion about weather changes, air quality and the benefits of nasal washing. Instruction on Warning signs, infection symptoms, calling MD promptly, preventive modes, and value of vaccinations. Review of effective airway clearance, coughing and/or vibration techniques. Emphasizing that all should Create an Action Plan. Written material given at graduation. Flowsheet Row Pulmonary Rehab from 03/09/2022 in [facility] Cardiac and Pulmonary Rehab  Education need identified 12/15/21  Date 02/16/22  Educator [facility]  Instruction Review Code 1- Verbalizes Understanding       AED/CPR: - Group verbal and written instruction with the use of models to  demonstrate the basic use of the AED with the basic ABC's of resuscitation.    Anatomy and Cardiac Procedures: - Group verbal and visual presentation and models provide information about basic cardiac anatomy and function. Reviews the testing methods done to diagnose heart disease and the outcomes of the test results. Describes the treatment choices: Medical Management, Angioplasty, or Coronary Bypass Surgery for treating various heart conditions including Myocardial Infarction, Angina, Valve Disease, and Cardiac Arrhythmias.  Written material given at graduation. Flowsheet Row Pulmonary Rehab from 03/09/2022 in [facility] Cardiac and Pulmonary Rehab  Date 01/26/22  Educator SB  Instruction Review Code 1- Verbalizes Understanding       Medication Safety: - Group verbal and visual instruction to review commonly prescribed medications for heart and lung disease. Reviews the medication, class of the drug, and side effects. Includes the steps to properly store meds and maintain the prescription regimen.  Written material given at graduation. Flowsheet Row Pulmonary Rehab from 03/09/2022 in [facility] Cardiac and Pulmonary Rehab  Date 02/02/22  Educator SB  Instruction Review Code 1- Verbalizes Understanding       Other: -Provides group and verbal instruction on various topics (see comments)   Knowledge Questionnaire Score:  Knowledge Questionnaire Score - 12/15/21 1422       Knowledge Questionnaire Score   Pre Score 14/18              Core Components/Risk Factors/Patient Goals at Admission:  Personal Goals and Risk Factors at Admission - 12/15/21 1427       Core Components/Risk Factors/Patient Goals on Admission    Weight Management Yes;Weight Loss    Intervention Weight Management: Develop a combined nutrition and exercise program designed to reach desired caloric intake, while maintaining appropriate intake of nutrient and fiber, sodium and fats, and appropriate energy expenditure  required for the weight goal.;Weight Management: Provide education and appropriate resources to help participant work on and attain dietary goals.;Weight Management/Obesity: Establish reasonable short term and long term weight goals.    Admit Weight 174 lb 4.8 oz (79.1 kg)    Goal Weight: Short Term 170 lb (77.1 kg)    Goal Weight: Long Term 164 lb (74.4 kg)    Expected Outcomes Short Term: Continue to assess and modify interventions until short term weight is achieved;Long Term: Adherence to nutrition and physical activity/exercise program aimed toward attainment of established weight goal;Weight Loss: Understanding of general recommendations for a balanced deficit meal plan, which promotes 1-2 lb weight loss per week and includes  a negative energy balance of [phone] kcal/d;Understanding recommendations for meals to include 15-35% energy as protein, 25-35% energy from fat, 35-60% energy from carbohydrates, less than 200mg  of dietary cholesterol, 20-35 gm of total fiber daily;Understanding of distribution of calorie intake throughout the day with the consumption of 4-5 meals/snacks    Improve shortness of breath with ADL's Yes    Intervention Provide education, individualized exercise plan and daily activity instruction to help decrease symptoms of SOB with activities of daily living.    Expected Outcomes Short Term: Improve cardiorespiratory fitness to achieve a reduction of symptoms when performing ADLs;Long Term: Be able to perform more ADLs without symptoms or delay the onset of symptoms    Diabetes Yes    Intervention Provide education about signs/symptoms and action to take for hypo/hyperglycemia.;Provide education about proper nutrition, including hydration, and aerobic/resistive exercise prescription along with prescribed medications to achieve blood glucose in normal ranges: Fasting glucose 65-99 mg/dL    Expected Outcomes Short Term: Participant verbalizes understanding of the signs/symptoms and  immediate care of hyper/hypoglycemia, proper foot care and importance of medication, aerobic/resistive exercise and nutrition plan for blood glucose control.;Long Term: Attainment of HbA1C < 7%.             Education:Diabetes - Individual verbal and written instruction to review signs/symptoms of diabetes, desired ranges of glucose level fasting, after meals and with exercise. Acknowledge that pre and post exercise glucose checks will be done for 3 sessions at entry of program. Flowsheet Row Pulmonary Rehab from 03/09/2022 in [facility] Cardiac and Pulmonary Rehab  Date 12/12/21  Educator [facility]  Instruction Review Code 1- Verbalizes Understanding       Know Your Numbers and Heart Failure: - Group verbal and visual instruction to discuss disease risk factors for cardiac and pulmonary disease and treatment options.  Reviews associated critical values for Overweight/Obesity, Hypertension, Cholesterol, and Diabetes.  Discusses basics of heart failure: signs/symptoms and treatments.  Introduces Heart Failure Zone chart for action plan for heart failure.  Written material given at graduation.   Core Components/Risk Factors/Patient Goals Review:   Goals and Risk Factor Review     Row Name 01/05/22 1004 02/02/22 1054 02/23/22 1006         Core Components/Risk Factors/Patient Goals Review   Personal Goals Review Diabetes;Improve shortness of breath with ADL's;Weight Management/Obesity Diabetes;Improve shortness of breath with ADL's;Weight Management/Obesity;Increase knowledge of respiratory medications and ability to use respiratory devices properly. Diabetes;Improve shortness of breath with ADL's;Weight Management/Obesity;Increase knowledge of respiratory medications and ability to use respiratory devices properly.;Hypertension     Review Ryelee recently started rehab and reports doing well. She reports her shortness of breath will get worse by "doing to much" - she feels this more right now as she is  moving next Monday. She does not check her O2 at home - encouraged to use a pulse oximeter. Amry reports that she uses PLB and feels this helps. She reports her BG has been running around 100 at home in the am. She reports not taking her metformin this moring, but usually doing well with taking her medications as prescribed with no issues. Her weight has been stable since starting rehab. Rashika is doing well in rehab.  Her weigth is staying steady.  Her pressures have been good and sugars are doign much better.  She has been running in the 140-150s.  She had one day at 191 and she was very surprised to see but it all evened back out.  She is  doing well on her meds but not consistent about using them the way she should. Hayly is doing well in rehab.  Her weight continues to stay steady.  She is able to do more now with less SOB.  She is doing well on her meds.  Her pressures are doing well and her sugars too.  She checks her sugars at night before dinner.  She will also check it if she feels off.     Expected Outcomes ST: consider getting a pulse oximeter to check O2 at home LT: manage risk factors Short: Continue to keep eye on her sugar Long: Continue to monitor risk factors Short: conitnue to montior sugar levels Long: continue to work on weight loss              Core Components/Risk Factors/Patient Goals at Discharge (Final Review):   Goals and Risk Factor Review - 02/23/22 1006       Core Components/Risk Factors/Patient Goals Review   Personal Goals Review Diabetes;Improve shortness of breath with ADL's;Weight Management/Obesity;Increase knowledge of respiratory medications and ability to use respiratory devices properly.;Hypertension    Review Rohini is doing well in rehab.  Her weight continues to stay steady.  She is able to do more now with less SOB.  She is doing well on her meds.  Her pressures are doing well and her sugars too.  She checks her sugars at night before dinner.  She will also check  it if she feels off.    Expected Outcomes Short: conitnue to montior sugar levels Long: continue to work on weight loss             ITP Comments:  ITP Comments     Row Name 12/12/21 1116 12/15/21 1421 12/21/21 0852 12/27/21 1039 01/18/22 0734   ITP Comments Virtual Visit completed. Patient informed on EP and RD appointment and 6 Minute walk test. Patient also informed of patient health questionnaires on My Chart. Patient Verbalizes understanding. Visit diagnosis can be found in [facility] 11/09/2021. Completed and gym orientation. Initial ITP created and sent for review to Dr Vida Rigger, Medical Director. 30 Day review completed. Medical Director ITP review done, changes made as directed, and signed approval by Medical Director.    NEW TO PROGRAM First full day of exercise!  Patient was oriented to gym and equipment including functions, settings, policies, and procedures.  Patient's individual exercise prescription and treatment plan were reviewed.  All starting workloads were established based on the results of the 6 minute walk test done at initial orientation visit.  The plan for exercise progression was also introduced and progression will be customized based on patient's performance and goals. 30 Day review completed. Medical Director ITP review done, changes made as directed, and signed approval by Medical Director.   New to program    Row Name 02/15/22 0945 03/15/22 0859         ITP Comments 30 Day review completed. Medical Director ITP review done, changes made as directed, and signed approval by Medical Director. 30 Day review completed. Medical Director ITP review done, changes made as directed, and signed approval by Medical Director.               Comments:

## 2022-03-16 ENCOUNTER — Encounter: Payer: BLUE CROSS/BLUE SHIELD | Admitting: *Deleted

## 2022-03-16 DIAGNOSIS — D869 Sarcoidosis, unspecified: Secondary | ICD-10-CM | POA: Diagnosis not present

## 2022-03-16 NOTE — Progress Notes (Signed)
Daily Session Note  Patient Details  Name: Brenda Ellison MRN: [id number] Date of Birth: 1960/07/18 Referring Provider:   Flowsheet Row Pulmonary Rehab from 12/15/2021 in [facility] Cardiac and Pulmonary Rehab  Referring Provider Thad Ranger, MD       Encounter Date: 03/16/2022  Check In:  Session Check In - 03/16/22 1000       Check-In   Supervising physician immediately available to respond to emergencies See telemetry face sheet for immediately available ER MD    Location [facility]-Cardiac & Pulmonary Rehab    Staff Present Darlyne Russian, RN, ADN;Meredith Sherryll Burger, RN Abel Presto, MS, ACSM CEP, Exercise Physiologist;Joseph Tessie Fass, [place]    Virtual Visit No    Medication changes reported     No    Fall or balance concerns reported    No    Warm-up and Cool-down Performed on first and last piece of equipment    Resistance Training Performed Yes    VAD Patient? No    PAD/SET Patient? No      Pain Assessment   Currently in Pain? No/denies                Social History   Tobacco Use  Smoking Status Former   Packs/day: 1.00   Years: 22.00   Total pack years: 22.00   Types: Cigarettes   Start date: 02/21/1988   Quit date: 02/20/2010   Years since quitting: 12.0  Smokeless Tobacco Never    Goals Met:  Independence with exercise equipment Exercise tolerated well No report of concerns or symptoms today Strength training completed today  Goals Unmet:  Not Applicable  Comments: Pt able to follow exercise prescription today without complaint.  Will continue to monitor for progression.    Dr. Emily Filbert is Medical Director for [place].  Dr. Ottie Glazier is Medical Director for [facility] Pulmonary Rehabilitation.

## 2022-03-21 ENCOUNTER — Encounter: Payer: BLUE CROSS/BLUE SHIELD | Admitting: *Deleted

## 2022-03-21 DIAGNOSIS — D869 Sarcoidosis, unspecified: Secondary | ICD-10-CM | POA: Diagnosis not present

## 2022-03-21 NOTE — Progress Notes (Signed)
Daily Session Note  Patient Details  Name: Brenda Ellison MRN: [id number] Date of Birth: [birth year] Referring Provider:   Flowsheet Row Pulmonary Rehab from 12/15/2021 in [facility] Cardiac and Pulmonary Rehab  Referring Provider Thad Ranger, MD       Encounter Date: 03/21/2022  Check In:  Session Check In - 03/21/22 1036       Check-In   Supervising physician immediately available to respond to emergencies See telemetry face sheet for immediately available ER MD    Location [facility]-Cardiac & Pulmonary Rehab    Staff Present Heath Lark, RN, BSN, CCRP;Jessica [place], MA, RCEP, CCRP, CCET;Noah Tickle, BS, Exercise Physiologist    Virtual Visit No    Medication changes reported     No    Fall or balance concerns reported    No    Warm-up and Cool-down Performed on first and last piece of equipment    Resistance Training Performed Yes    VAD Patient? No    PAD/SET Patient? No      Pain Assessment   Currently in Pain? No/denies                Social History   Tobacco Use  Smoking Status Former   Packs/day: 1.00   Years: 22.00   Total pack years: 22.00   Types: Cigarettes   Start date: 02/21/1988   Quit date: 02/20/2010   Years since quitting: 12.0  Smokeless Tobacco Never    Goals Met:  Proper associated with RPD/PD & O2 Sat Independence with exercise equipment Exercise tolerated well No report of concerns or symptoms today  Goals Unmet:  Not Applicable  Comments: Pt able to follow exercise prescription today without complaint.  Will continue to monitor for progression.    Dr. Emily Filbert is Medical Director for [place].  Dr. Ottie Glazier is Medical Director for [facility] Pulmonary Rehabilitation.

## 2022-03-23 ENCOUNTER — Encounter: Payer: BLUE CROSS/BLUE SHIELD | Attending: Family Medicine | Admitting: *Deleted

## 2022-03-23 DIAGNOSIS — D869 Sarcoidosis, unspecified: Secondary | ICD-10-CM | POA: Insufficient documentation

## 2022-03-23 NOTE — Progress Notes (Signed)
Daily Session Note  Patient Details  Name: Brenda Ellison MRN: [id number] Date of Birth: 09-07-1960 Referring Provider:   Flowsheet Row Pulmonary Rehab from 12/15/2021 in [facility] Cardiac and Pulmonary Rehab  Referring Provider Thad Ranger, MD       Encounter Date: 03/23/2022  Check In:  Session Check In - 03/23/22 1042       Check-In   Supervising physician immediately available to respond to emergencies See telemetry face sheet for immediately available ER MD    Location [facility]-Cardiac & Pulmonary Rehab    Staff Present Darlyne Russian, RN, Lorin Mercy, MS, ACSM CEP, Exercise Physiologist;Joseph Tessie Fass, [place]    Virtual Visit No    Medication changes reported     No    Fall or balance concerns reported    No    Warm-up and Cool-down Performed on first and last piece of equipment    Resistance Training Performed Yes    VAD Patient? No    PAD/SET Patient? No      Pain Assessment   Currently in Pain? No/denies                Social History   Tobacco Use  Smoking Status Former   Packs/day: 1.00   Years: 22.00   Total pack years: 22.00   Types: Cigarettes   Start date: 02/21/1988   Quit date: 02/20/2010   Years since quitting: 12.0  Smokeless Tobacco Never    Goals Met:  Independence with exercise equipment Exercise tolerated well No report of concerns or symptoms today Strength training completed today  Goals Unmet:  Not Applicable  Comments: Pt able to follow exercise prescription today without complaint.  Will continue to monitor for progression.    Dr. Emily Filbert is Medical Director for [place].  Dr. Ottie Glazier is Medical Director for [facility] Pulmonary Rehabilitation.

## 2022-03-24 NOTE — Progress Notes (Unsigned)
   Established Patient Office Visit  Subjective   Patient ID: Brenda Ellison, female    DOB: 1960/06/15  Age: 62 y.o. MRN: [id number]  No chief complaint on file.   HPI  Patient is here for follow up on diabetes.   Diabetes, Type 2:  -Last A1c 6.1% 11/23 -Medications: Farxiga 5 mg, Metformin 500 mg BID -Patient is compliant with the above medications and reports no side effects. *** -Checking BG at home: *** -Fasting home BG: *** -Post-prandial home BG: *** -Highest home BG since last visit: *** -Lowest home BG since last visit: *** -Diet: *** -Exercise: *** -Eye exam: *** -Foot exam: *** -Microalbumin: UTD 5/23 -Statin: No -PNA vaccine: Prevnar 20 due -Denies symptoms of hypoglycemia, polyuria, polydipsia, numbness extremities, foot ulcers/trauma. ***   {History (Optional):23778}  ROS    Objective:     There were no vitals taken for this visit. {Vitals History (Optional):23777}  Physical Exam   No results found for any visits on 03/27/22.  {Labs (Optional):23779}  The 10-year ASCVD risk score (Arnett DK, et al., 2019) is: 9%    Assessment & Plan:   Problem List Items Addressed This Visit   None   No follow-ups on file.    Teodora Medici, DO

## 2022-03-27 ENCOUNTER — Ambulatory Visit: Payer: BLUE CROSS/BLUE SHIELD | Admitting: Internal Medicine

## 2022-03-27 ENCOUNTER — Encounter: Payer: Self-pay | Admitting: Internal Medicine

## 2022-03-27 VITALS — BP 118/70 | HR 78 | Temp 98.3°F | Resp 16 | Ht 64.0 in | Wt 176.7 lb

## 2022-03-27 DIAGNOSIS — R3915 Urgency of urination: Secondary | ICD-10-CM

## 2022-03-27 DIAGNOSIS — K219 Gastro-esophageal reflux disease without esophagitis: Secondary | ICD-10-CM

## 2022-03-27 DIAGNOSIS — Z23 Encounter for immunization: Secondary | ICD-10-CM | POA: Diagnosis not present

## 2022-03-27 DIAGNOSIS — E119 Type 2 diabetes mellitus without complications: Secondary | ICD-10-CM

## 2022-03-27 LAB — POCT URINALYSIS DIPSTICK
Bilirubin, UA: NEGATIVE
Blood, UA: NEGATIVE
Glucose, UA: NEGATIVE
Ketones, UA: NEGATIVE
Leukocytes, UA: NEGATIVE
Nitrite, UA: NEGATIVE
Odor: NORMAL
Protein, UA: NEGATIVE
Spec Grav, UA: 1.015 (ref 1.010–1.025)
Urobilinogen, UA: 0.2 E.U./dL
pH, UA: 6 (ref 5.0–8.0)

## 2022-03-27 LAB — POCT GLYCOSYLATED HEMOGLOBIN (HGB A1C): Hemoglobin A1C: 7 % — AB (ref 4.0–5.6)

## 2022-03-27 MED ORDER — PANTOPRAZOLE SODIUM 20 MG PO TBEC: 20.0000 mg | DELAYED_RELEASE_TABLET | Freq: Two times a day (BID) | ORAL | 1 refills | Status: DC

## 2022-03-27 MED ORDER — METFORMIN HCL ER 500 MG PO TB24: 500.0000 mg | ORAL_TABLET | Freq: Two times a day (BID) | ORAL | 1 refills | Status: DC

## 2022-03-27 NOTE — Patient Instructions (Signed)
It was great seeing you today!  Plan discussed at today's visit: -Metformin switched to extended release version to help with diarrhea -A1c 7.0% which is slightly increased, try to decrease sugar and carbs -Urine test today -Prevnar 20 vaccine today as well  -Acid reflux medications refilled   Follow up in: 3 months   Take care and let us know if you have any questions or concerns prior to your next visit.  [name]

## 2022-03-28 ENCOUNTER — Encounter: Payer: BLUE CROSS/BLUE SHIELD | Admitting: *Deleted

## 2022-03-28 ENCOUNTER — Ambulatory Visit: Payer: Self-pay | Admitting: *Deleted

## 2022-03-28 DIAGNOSIS — D869 Sarcoidosis, unspecified: Secondary | ICD-10-CM | POA: Diagnosis not present

## 2022-03-28 NOTE — Progress Notes (Signed)
Daily Session Note  Patient Details  Name: Brenda Ellison MRN: [id number] Date of Birth: [birth year] Referring Provider:   Flowsheet Row Pulmonary Rehab from 12/15/2021 in [facility] Cardiac and Pulmonary Rehab  Referring Provider Thad Ranger, MD       Encounter Date: 03/28/2022  Check In:  Session Check In - 03/28/22 1039       Check-In   Supervising physician immediately available to respond to emergencies See telemetry face sheet for immediately available ER MD    Location [facility]-Cardiac & Pulmonary Rehab    Staff Present Heath Lark, RN, BSN, CCRP;Jessica [place], MA, RCEP, CCRP, Bertram Gala, MS, ACSM CEP, Exercise Physiologist    Virtual Visit No    Medication changes reported     No    Fall or balance concerns reported    No    Warm-up and Cool-down Performed on first and last piece of equipment    Resistance Training Performed Yes    VAD Patient? No    PAD/SET Patient? No      Pain Assessment   Currently in Pain? No/denies                Social History   Tobacco Use  Smoking Status Former   Packs/day: 1.00   Years: 22.00   Total pack years: 22.00   Types: Cigarettes   Start date: 02/21/1988   Quit date: 02/20/2010   Years since quitting: 12.1  Smokeless Tobacco Never    Goals Met:  Proper associated with RPD/PD & O2 Sat Independence with exercise equipment Exercise tolerated well No report of concerns or symptoms today  Goals Unmet:  Not Applicable  Comments: Pt able to follow exercise prescription today without complaint.  Will continue to monitor for progression.    Dr. Emily Filbert is Medical Director for [place].  Dr. Ottie Glazier is Medical Director for [facility] Pulmonary Rehabilitation.

## 2022-03-28 NOTE — Telephone Encounter (Signed)
Medication Advise   Pt is calling to ask when should she start taking the new dosage of metFORMIN (GLUCOPHAGE-XR) 500 MG 24 hr tablet [[id number]]?  Please advise       Chief Complaint: Med Question Symptoms: NA Frequency: NA Pertinent Negatives: Patient denies NA Disposition: [] ED /[] Urgent Care (no appt availability in office) / [] Appointment(In office/virtual)/ []   Virtual Care/ [x] Home Care/ [] Refused Recommended Disposition /[]  Mobile Bus/ []  Follow-up with PCP Additional Notes: Pt questioning when she should start taking new dosage of Metformin and Protonix, advised may start today. Did not note any delay starting meds was advised in OV notes. Pt verbalizes understanding. Advised to [facility] for any additional questions.  Reason for Disposition  Caller has medicine question only, adult not sick, AND triager answers question  Answer Assessment - Initial Assessment Questions 1. NAME of MEDICINE: "What medicine(s) are you calling about?"     New dosage of Metformin. 2. QUESTION: "What is your question?" (e.g., double dose of medicine, side effect)     When should I start 3. PRESCRIBER: "Who prescribed the medicine?" Reason: if prescribed by specialist, call should be referred to that group.     PCP  Protocols used: Medication Question Call-A-AH

## 2022-03-29 ENCOUNTER — Ambulatory Visit
Admission: RE | Admit: 2022-03-29 | Discharge: 2022-03-29 | Disposition: A | Discharge status: Home / Self Care | Payer: BLUE CROSS/BLUE SHIELD | Source: Ambulatory Visit | Attending: Family Medicine | Admitting: Family Medicine

## 2022-03-29 DIAGNOSIS — Z1231 Encounter for screening mammogram for malignant neoplasm of breast: Secondary | ICD-10-CM | POA: Diagnosis present

## 2022-03-30 ENCOUNTER — Encounter: Payer: BLUE CROSS/BLUE SHIELD | Admitting: *Deleted

## 2022-03-30 DIAGNOSIS — D869 Sarcoidosis, unspecified: Secondary | ICD-10-CM

## 2022-03-30 NOTE — Progress Notes (Signed)
Daily Session Note  Patient Details  Name: LENZIE MONTESANO MRN: [id number] Date of Birth: 1960/04/30 Referring Provider:   Flowsheet Row Pulmonary Rehab from 12/15/2021 in [facility] Cardiac and Pulmonary Rehab  Referring Provider Thad Ranger, MD       Encounter Date: 03/30/2022  Check In:  Session Check In - 03/30/22 1011       Check-In   Supervising physician immediately available to respond to emergencies See telemetry face sheet for immediately available ER MD    Location [facility]-Cardiac & Pulmonary Rehab    Staff Present Renita Papa, RN BSN;Noah Tickle, BS, Exercise Physiologist;Kara Maricela Bo, MS, ACSM CEP, Exercise Physiologist    Virtual Visit No    Medication changes reported     No    Fall or balance concerns reported    No    Warm-up and Cool-down Performed on first and last piece of equipment    Resistance Training Performed Yes    VAD Patient? No    PAD/SET Patient? No      Pain Assessment   Currently in Pain? No/denies                Social History   Tobacco Use  Smoking Status Former   Packs/day: 1.00   Years: 22.00   Total pack years: 22.00   Types: Cigarettes   Start date: 02/21/1988   Quit date: 02/20/2010   Years since quitting: 12.1  Smokeless Tobacco Never    Goals Met:  Independence with exercise equipment Exercise tolerated well No report of concerns or symptoms today Strength training completed today  Goals Unmet:  Not Applicable  Comments: Pt able to follow exercise prescription today without complaint.  Will continue to monitor for progression.'    Dr. Emily Filbert is Medical Director for [place].  Dr. Ottie Glazier is Medical Director for [facility] Pulmonary Rehabilitation.

## 2022-03-31 ENCOUNTER — Ambulatory Visit: Payer: Self-pay

## 2022-03-31 NOTE — Telephone Encounter (Signed)
2nd attempt, Patient called, left VM to return the call to the office to discuss symptoms with a nurse.

## 2022-03-31 NOTE — Telephone Encounter (Signed)
Patient called, left VM to return the call to the office to discuss symptoms with a nurse.   Summary: swelling of the left side   Pt has swelling of her left side near hip / please advise

## 2022-03-31 NOTE — Telephone Encounter (Signed)
Copied from [place] [id number]. Topic: General - Other >> Mar 31, 2022  9:26 AM Chapman Fitch wrote: Reason for CRM: Pt asked if Dr.Andrews can give her a call / about getting an xray for the swelling on her left side / please advise

## 2022-04-03 ENCOUNTER — Other Ambulatory Visit: Payer: Self-pay | Admitting: Internal Medicine

## 2022-04-03 ENCOUNTER — Telehealth: Payer: Self-pay | Admitting: Family Medicine

## 2022-04-03 NOTE — Telephone Encounter (Signed)
Copied from [place]. Topic: Referral - Request for Referral >> Apr 03, 2022  1:13 PM Brenda Ellison wrote: Has patient seen PCP for this complaint? Yes.   *If NO, is insurance requiring patient see PCP for this issue before PCP can refer them? N/a Referral for which specialty: Gynecologist  Preferred provider/office: n/a Reason for referral: Patient is having gynecological issues.   Patient met with [name] last week and says she was supposed to refer her to a gynecologist. Patient is calling following up on that referral. Please advise patient.

## 2022-04-04 ENCOUNTER — Encounter: Payer: BLUE CROSS/BLUE SHIELD | Admitting: *Deleted

## 2022-04-04 DIAGNOSIS — D869 Sarcoidosis, unspecified: Secondary | ICD-10-CM

## 2022-04-04 NOTE — Telephone Encounter (Signed)
Left detailed vm °

## 2022-04-04 NOTE — Progress Notes (Signed)
Daily Session Note  Patient Details  Name: Brenda Ellison MRN: [id number] Date of Birth: 1960-12-28 Referring Provider:   Flowsheet Row Pulmonary Rehab from 12/15/2021 in [facility] Cardiac and Pulmonary Rehab  Referring Provider Thad Ranger, MD       Encounter Date: 04/04/2022  Check In:  Session Check In - 04/04/22 1046       Check-In   Supervising physician immediately available to respond to emergencies See telemetry face sheet for immediately available ER MD    Location [facility]-Cardiac & Pulmonary Rehab    Staff Present Heath Lark, RN, BSN, CCRP;Jessica Weston, MA, RCEP, CCRP, Bertram Gala, MS, ACSM CEP, Exercise Physiologist    Virtual Visit No    Medication changes reported     No    Fall or balance concerns reported    No    Warm-up and Cool-down Performed on first and last piece of equipment    Resistance Training Performed Yes    VAD Patient? No    PAD/SET Patient? No      Pain Assessment   Currently in Pain? No/denies                Social History   Tobacco Use  Smoking Status Former   Packs/day: 1.00   Years: 22.00   Total pack years: 22.00   Types: Cigarettes   Start date: 02/21/1988   Quit date: 02/20/2010   Years since quitting: 12.1  Smokeless Tobacco Never    Goals Met:  Proper associated with RPD/PD & O2 Sat Independence with exercise equipment Exercise tolerated well No report of concerns or symptoms today  Goals Unmet:  Not Applicable  Comments: Pt able to follow exercise prescription today without complaint.  Will continue to monitor for progression.    Dr. Emily Filbert is Medical Director for [facility].  Dr. Ottie Glazier is Medical Director for [facility] Pulmonary Rehabilitation.

## 2022-04-05 DIAGNOSIS — R591 Generalized enlarged lymph nodes: Secondary | ICD-10-CM | POA: Diagnosis not present

## 2022-04-05 DIAGNOSIS — D860 Sarcoidosis of lung: Secondary | ICD-10-CM | POA: Diagnosis not present

## 2022-04-06 ENCOUNTER — Telehealth: Payer: Self-pay

## 2022-04-06 ENCOUNTER — Encounter: Payer: BLUE CROSS/BLUE SHIELD | Admitting: *Deleted

## 2022-04-06 DIAGNOSIS — D869 Sarcoidosis, unspecified: Secondary | ICD-10-CM

## 2022-04-06 DIAGNOSIS — M545 Low back pain, unspecified: Secondary | ICD-10-CM

## 2022-04-06 NOTE — Progress Notes (Signed)
Daily Session Note  Patient Details  Name: Brenda Ellison MRN: [id number] Date of Birth: 1960/12/07 Referring Provider:   Flowsheet Row Pulmonary Rehab from 12/15/2021 in [facility] Cardiac and Pulmonary Rehab  Referring Provider Thad Ranger, MD       Encounter Date: 04/06/2022  Check In:  Session Check In - 04/06/22 1001       Check-In   Supervising physician immediately available to respond to emergencies See telemetry face sheet for immediately available ER MD    Location [facility]-Cardiac & Pulmonary Rehab    Staff Present Darlyne Russian, RN, ADN;Jessica Luan Pulling, MA, RCEP, CCRP, Bertram Gala, MS, ACSM CEP, Exercise Physiologist    Virtual Visit No    Medication changes reported     No    Fall or balance concerns reported    No    Warm-up and Cool-down Performed on first and last piece of equipment    Resistance Training Performed Yes    VAD Patient? No    PAD/SET Patient? No      Pain Assessment   Currently in Pain? No/denies                Social History   Tobacco Use  Smoking Status Former   Packs/day: 1.00   Years: 22.00   Total pack years: 22.00   Types: Cigarettes   Start date: 02/21/1988   Quit date: 02/20/2010   Years since quitting: 12.1  Smokeless Tobacco Never    Goals Met:  Independence with exercise equipment Exercise tolerated well No report of concerns or symptoms today Strength training completed today  Goals Unmet:  Not Applicable  Comments: Pt able to follow exercise prescription today without complaint.  Will continue to monitor for progression.    Dr. Emily Filbert is Medical Director for [facility].  Dr. Ottie Glazier is Medical Director for [facility] Pulmonary Rehabilitation.

## 2022-04-06 NOTE — Telephone Encounter (Signed)
Pt states still having back pain never did PT but wants to go ahead and and get referral to spine specialist per her xray you did in December?

## 2022-04-06 NOTE — Telephone Encounter (Signed)
Called pt back went to VM

## 2022-04-06 NOTE — Telephone Encounter (Signed)
Pt is calling to speak back with Roselyn Reef. Called over to the practice and Roselyn Reef is with another pt.   Could Roselyn Reef please call the pt back once she get a chance.

## 2022-04-11 ENCOUNTER — Encounter: Payer: BLUE CROSS/BLUE SHIELD | Admitting: *Deleted

## 2022-04-11 DIAGNOSIS — D869 Sarcoidosis, unspecified: Secondary | ICD-10-CM

## 2022-04-11 NOTE — Progress Notes (Signed)
Daily Session Note  Patient Details  Name: Brenda Ellison MRN: [id number] Date of Birth: 1960/03/19 Referring Provider:   Flowsheet Row Pulmonary Rehab from 12/15/2021 in [facility] Cardiac and Pulmonary Rehab  Referring Provider Thad Ranger, MD       Encounter Date: 04/11/2022  Check In:  Session Check In - 04/11/22 1131       Check-In   Supervising physician immediately available to respond to emergencies See telemetry face sheet for immediately available ER MD    Location [facility]-Cardiac & Pulmonary Rehab    Staff Present Nyoka Cowden, RN, BSN, Melina Schools, MS, ACSM CEP, Exercise Physiologist;Laron Angelini Luan Pulling, MA, RCEP, CCRP, CCET    Virtual Visit No    Medication changes reported     No    Fall or balance concerns reported    No    Tobacco Cessation No Change    Warm-up and Cool-down Performed on first and last piece of equipment    Resistance Training Performed No    VAD Patient? No    PAD/SET Patient? No      Pain Assessment   Currently in Pain? No/denies                Social History   Tobacco Use  Smoking Status Former   Packs/day: 1.00   Years: 22.00   Total pack years: 22.00   Types: Cigarettes   Start date: 02/21/1988   Quit date: 02/20/2010   Years since quitting: 12.1  Smokeless Tobacco Never    Goals Met:  Independence with exercise equipment Exercise tolerated well No report of concerns or symptoms today  Goals Unmet:  Not Applicable  Comments: Pt able to follow exercise prescription today without complaint.  Will continue to monitor for progression.    Dr. Emily Filbert is Medical Director for [facility].  Dr. Ottie Glazier is Medical Director for [facility] Pulmonary Rehabilitation.

## 2022-04-12 ENCOUNTER — Encounter: Payer: Self-pay | Admitting: *Deleted

## 2022-04-12 DIAGNOSIS — D869 Sarcoidosis, unspecified: Secondary | ICD-10-CM

## 2022-04-12 NOTE — Progress Notes (Signed)
Pulmonary Individual Treatment Plan  Patient Details  Name: Brenda Ellison MRN: [id number] Date of Birth: 1960/06/17 Referring Provider:   Flowsheet Row Pulmonary Rehab from 12/15/2021 in [facility] Cardiac and Pulmonary Rehab  Referring Provider Thad Ranger, MD       Initial Encounter Date:  Flowsheet Row Pulmonary Rehab from 12/15/2021 in [facility] Cardiac and Pulmonary Rehab  Date 12/15/21       Visit Diagnosis: Sarcoidosis  Patient's Home Medications on Admission:  Current Outpatient Medications:    albuterol (VENTOLIN HFA) 108 (90 Base) MCG/ACT inhaler, Inhale 2 puffs into the lungs every 4 (four) hours as needed for wheezing or shortness of breath., Disp: 18 g, Rfl: 3   albuterol (VENTOLIN HFA) 108 (90 Base) MCG/ACT inhaler, , Disp: , Rfl:    atorvastatin (LIPITOR) 20 MG tablet, TAKE 1 TABLET BY MOUTH AT BEDTIME, Disp: 30 tablet, Rfl: 8   benzonatate (TESSALON) 100 MG capsule, Take 1-2 capsules (100-200 mg total) by mouth 3 (three) times daily as needed for cough., Disp: 30 capsule, Rfl: 1   blood glucose meter kit and supplies, Dispense based on patient and insurance preference. Use up to four times daily as directed. (FOR ICD-10 E10.9, E11.9)., Disp: 1 each, Rfl: 0   budesonide-formoterol (SYMBICORT) 160-4.5 MCG/ACT inhaler, Inhale 2 puffs into the lungs 2 (two) times daily., Disp: 1 each, Rfl: 0   celecoxib (CELEBREX) 100 MG capsule, Take 1 capsule by mouth 2 (two) times daily., Disp: , Rfl:    cetirizine (ZYRTEC ALLERGY) 10 MG tablet, 1 tablet Orally Once a day for 30 day(s), Disp: , Rfl:    Cholecalciferol (VITAMIN D3) 2000 UNITS capsule, Take 2,000 Units by mouth daily., Disp: , Rfl:    dapagliflozin propanediol (FARXIGA) 5 MG TABS tablet, Take 1 tablet (5 mg total) by mouth daily before breakfast., Disp: 30 tablet, Rfl: 5   fluticasone (FLONASE) 50 MCG/ACT nasal spray, Place 2 sprays into both nostrils daily., Disp: 16 g, Rfl: 6   gabapentin (NEURONTIN) 100 MG capsule, Take 100  mg by mouth 2 (two) times daily., Disp: , Rfl:    levocetirizine (XYZAL) 5 MG tablet, Take 1 tablet (5 mg total) by mouth every evening., Disp: 90 tablet, Rfl: 1   meclizine (ANTIVERT) 12.5 MG tablet, Take 1 tablet (12.5 mg total) by mouth 3 (three) times daily as needed for dizziness., Disp: 30 tablet, Rfl: 0   metFORMIN (GLUCOPHAGE-XR) 500 MG 24 hr tablet, Take 1 tablet (500 mg total) by mouth 2 (two) times daily with a meal., Disp: 90 tablet, Rfl: 1   montelukast (SINGULAIR) 10 MG tablet, Take 1 tablet (10 mg total) by mouth at bedtime., Disp: 30 tablet, Rfl: 3   pantoprazole (PROTONIX) 20 MG tablet, Take 1 tablet (20 mg total) by mouth 2 (two) times daily. For stomach protection with NSAIDs and GERD, Disp: 60 tablet, Rfl: 1   promethazine-dextromethorphan (PROMETHAZINE-DM) 6.25-15 MG/5ML syrup, Take 2.5-5 mLs by mouth 3 (three) times daily as needed for cough., Disp: 118 mL, Rfl: 1   tiZANidine (ZANAFLEX) 4 MG tablet, Take 1 tablet (4 mg total) by mouth every 8 (eight) hours as needed for muscle spasms (muscle tightness/spasms)., Disp: 30 tablet, Rfl: 1  Past Medical History: Past Medical History:  Diagnosis Date   Arthritis pain    Bilateral leg pain    Chronic nasal congestion    Diabetes mellitus without complication (HCC)    Dizziness of unknown cause    Heart murmur previously undiagnosed    Osteoarthritis, hand  Prediabetes 07/12/2016    Tobacco Use: Social History   Tobacco Use  Smoking Status Former   Packs/day: 1.00   Years: 22.00   Total pack years: 22.00   Types: Cigarettes   Start date: 02/21/1988   Quit date: 02/20/2010   Years since quitting: 12.1  Smokeless Tobacco Never    Labs: Review Flowsheet  More data exists      Latest Ref Rng & Units 09/07/2020 06/23/2021 09/28/2021 12/26/2021 03/27/2022  Labs for ITP Cardiac and Pulmonary Rehab  Cholestrol <200 mg/dL - 151  - - -  LDL (calc) mg/dL (calc) - 69  - - -  HDL-C > OR = 50 mg/dL - 47  - - -  Trlycerides <150  mg/dL - 263  - - -  Hemoglobin A1c 4.0 - 5.6 % 6.5  8.0  8.3  6.1  7.0      Pulmonary Assessment Scores:  Pulmonary Assessment Scores     Row Name 12/15/21 1423         ADL UCSD   SOB Score total 56     Rest 0     Walk 3     Stairs 5     Bath 3     Dress 3     Shop 4       CAT Score   CAT Score 15       mMRC Score   mMRC Score 1              UCSD: Self-administered rating of dyspnea associated with activities of daily living (ADLs) 6-point scale (0 = "not at all" to 5 = "maximal or unable to do because of breathlessness")  Scoring Scores range from 0 to 120.  Minimally important difference is 5 units  CAT: CAT can identify the health impairment of COPD patients and is better correlated with disease progression.  CAT has a scoring range of zero to 40. The CAT score is classified into four groups of low (less than 10), medium (10 - 20), high (21-30) and very high (31-40) based on the impact level of disease on health status. A CAT score over 10 suggests significant symptoms.  A worsening CAT score could be explained by an exacerbation, poor medication adherence, poor inhaler technique, or progression of COPD or comorbid conditions.  CAT MCID is 2 points  mMRC: mMRC (Modified Medical Research Council) Dyspnea Scale is used to assess the degree of baseline functional disability in patients of respiratory disease due to dyspnea. No minimal important difference is established. A decrease in score of 1 point or greater is considered a positive change.   Pulmonary Function Assessment:  Pulmonary Function Assessment - 12/12/21 1118       Breath   Shortness of Breath Yes;Limiting activity             Exercise Target Goals: Exercise Program Goal: Individual exercise prescription set using results from initial 6 min walk test and THRR while considering  patient's activity barriers and safety.   Exercise Prescription Goal: Initial exercise prescription builds to  30-45 minutes a day of aerobic activity, 2-3 days per week.  Home exercise guidelines will be given to patient during program as part of exercise prescription that the participant will acknowledge.  Education: Aerobic Exercise: - Group verbal and visual presentation on the components of exercise prescription. Introduces F.I.T.T principle from ACSM for exercise prescriptions.  Reviews F.I.T.T. principles of aerobic exercise including progression. Written material given at graduation. Flowsheet Row  Pulmonary Rehab from 04/06/2022 in [facility] Cardiac and Pulmonary Rehab  Education need identified 12/15/21  Date 03/09/22  Educator [facility]  Instruction Review Code 1- Verbalizes Understanding       Education: Resistance Exercise: - Group verbal and visual presentation on the components of exercise prescription. Introduces F.I.T.T principle from ACSM for exercise prescriptions  Reviews F.I.T.T. principles of resistance exercise including progression. Written material given at graduation. Flowsheet Row Pulmonary Rehab from 04/06/2022 in [facility] Cardiac and Pulmonary Rehab  Date 03/16/22  Educator [facility]  Instruction Review Code 1- Verbalizes Understanding        Education: Exercise & Equipment Safety: - Individual verbal instruction and demonstration of equipment use and safety with use of the equipment. Flowsheet Row Pulmonary Rehab from 04/06/2022 in [facility] Cardiac and Pulmonary Rehab  Date 12/12/21  Educator [facility]  Instruction Review Code 1- Verbalizes Understanding       Education: Exercise Physiology & General Exercise Guidelines: - Group verbal and written instruction with models to review the exercise physiology of the cardiovascular system and associated critical values. Provides general exercise guidelines with specific guidelines to those with heart or lung disease.  Flowsheet Row Pulmonary Rehab from 04/06/2022 in [facility] Cardiac and Pulmonary Rehab  Date 03/02/22  Educator [facility]  Instruction Review Code  1- Verbalizes Understanding       Education: Flexibility, Balance, Mind/Body Relaxation: - Group verbal and visual presentation with interactive activity on the components of exercise prescription. Introduces F.I.T.T principle from ACSM for exercise prescriptions. Reviews F.I.T.T. principles of flexibility and balance exercise training including progression. Also discusses the mind body connection.  Reviews various relaxation techniques to help reduce and manage stress (i.e. Deep breathing, progressive muscle relaxation, and visualization). Balance handout provided to take home. Written material given at graduation. Flowsheet Row Pulmonary Rehab from 04/06/2022 in [facility] Cardiac and Pulmonary Rehab  Date 03/23/22  Educator [facility]  Instruction Review Code 1- Verbalizes Understanding       Activity Barriers & Risk Stratification:  Activity Barriers & Cardiac Risk Stratification - 12/15/21 1456       Activity Barriers & Cardiac Risk Stratification   Activity Barriers Deconditioning;Shortness of Breath             6 Minute Walk:  6 Minute Walk     Row Name 12/15/21 1448         6 Minute Walk   Distance 1080 feet     Walk Time 6 minutes     # of Rest Breaks 0     MPH 2.05     METS 2.9     RPE 11     Perceived Dyspnea  2     VO2 Peak 10.15     Symptoms Yes (comment)     Comments SOB, Fatigue     Resting HR 60 bpm     Resting BP 118/62     Resting Oxygen Saturation  95 %     Exercise Oxygen Saturation  during 6 min walk 92 %     Max Ex. HR 96 bpm     Max Ex. BP 132/84     2 Minute Post BP 120/70       Interval HR   1 Minute HR 83     2 Minute HR 91     3 Minute HR 91     4 Minute HR 93     5 Minute HR 96     6 Minute HR 95  2 Minute Post HR 67     Interval Heart Rate? Yes       Interval Oxygen   Interval Oxygen? Yes     Baseline Oxygen Saturation % 95 %     1 Minute Oxygen Saturation % 95 %     1 Minute Liters of Oxygen 0 L  RA     2 Minute Oxygen Saturation  % 92 %     2 Minute Liters of Oxygen 0 L  RA     3 Minute Oxygen Saturation % 94 %     3 Minute Liters of Oxygen 0 L  RA     4 Minute Oxygen Saturation % 95 %     4 Minute Liters of Oxygen 0 L  RA     5 Minute Oxygen Saturation % 94 %     5 Minute Liters of Oxygen 0 L  RA     6 Minute Oxygen Saturation % 92 %     6 Minute Liters of Oxygen 0 L  RA     2 Minute Post Oxygen Saturation % 97 %     2 Minute Post Liters of Oxygen 0 L  RA             Oxygen Initial Assessment:  Oxygen Initial Assessment - 12/27/21 1040       Home Oxygen   Home Oxygen Device None    Sleep Oxygen Prescription None    Home Exercise Oxygen Prescription None    Home Resting Oxygen Prescription None    Compliance with Home Oxygen Use Yes      Intervention   Short Term Goals To learn and demonstrate proper pursed lip breathing techniques or other breathing techniques.     Long  Term Goals Exhibits proper breathing techniques, such as pursed lip breathing or other method taught during program session             Oxygen Re-Evaluation:  Oxygen Re-Evaluation     Row Name 12/27/21 1041 01/05/22 1008 02/02/22 1101 02/23/22 1008 03/23/22 1207     Program Oxygen Prescription   Program Oxygen Prescription -- None None None None     Home Oxygen   Home Oxygen Device -- None None None None   Sleep Oxygen Prescription -- None None None None   Home Exercise Oxygen Prescription -- None None None None   Home Resting Oxygen Prescription -- None None None None   Compliance with Home Oxygen Use -- Yes No -- --     Goals/Expected Outcomes   Short Term Goals -- To learn and demonstrate proper pursed lip breathing techniques or other breathing techniques.  To learn and demonstrate proper pursed lip breathing techniques or other breathing techniques. ;To learn and understand importance of monitoring SPO2 with pulse oximeter and demonstrate accurate use of the pulse oximeter.;To learn and understand importance of  maintaining oxygen saturations>88% To learn and demonstrate proper pursed lip breathing techniques or other breathing techniques. ;To learn and understand importance of monitoring SPO2 with pulse oximeter and demonstrate accurate use of the pulse oximeter.;To learn and understand importance of maintaining oxygen saturations>88% To learn and demonstrate proper pursed lip breathing techniques or other breathing techniques.    Long  Term Goals -- Exhibits proper breathing techniques, such as pursed lip breathing or other method taught during program session Verbalizes importance of monitoring SPO2 with pulse oximeter and return demonstration;Maintenance of O2 saturations>88%;Exhibits proper breathing techniques, such as pursed lip breathing or  other method taught during program session;Compliance with respiratory medication Verbalizes importance of monitoring SPO2 with pulse oximeter and return demonstration;Maintenance of O2 saturations>88%;Exhibits proper breathing techniques, such as pursed lip breathing or other method taught during program session;Compliance with respiratory medication Exhibits proper breathing techniques, such as pursed lip breathing or other method taught during program session   Comments Reviewed PLB technique with pt.  Talked about how it works and it's importance in maintaining their exercise saturations. Naje recently started rehab and reports doing well. She reports her shortness of breath will get worse by "doing to much" - she feels this more right now as she is moving next Monday. She does not check her O2 at home - encouraged to use a pulse oximeter. Preksha reports that she uses PLB and feels this helps Braeli has not been using her inhalers.  She also has not used her nebulizer recently.  We talked about the importance of using her meds the right way.  She said she will work on getting back into the habit.  She has also not been using her PLB so we reviewed it again. Onika is doing well  in rehab.  She has not gotten a pulse oximeter yet.  She wants to go get one along with weights.  She is back to using inhalers but still not sure if they are helping. Diaphragmatic and PLB breathing explained and performed with patient. Patient has a better understanding of how to do these exercises to help with breathing performance and relaxation. Patient performed breathing techniques adequately and to practice further at home.   Goals/Expected Outcomes Short: Become more profiecient at using PLB. Long: Become independent at using PLB. Short: Become more profiecient at using PLB, consider getting a pulse oximeter  Long: Become independent at using PLB. Short: Get back to using meds and PLB Long Continue to improve compliance with meds Short: Get pulse oximeter Long: Contineu to use inhalers consistently Short: practice PLB and diaphragmatic breathing at home. Long: Use PLB and diaphragmatic breathing independently post [employer].    [place] Name 03/30/22 1004             Program Oxygen Prescription   Program Oxygen Prescription None         Home Oxygen   Home Oxygen Device None       Sleep Oxygen Prescription None       Home Exercise Oxygen Prescription None       Home Resting Oxygen Prescription None         Goals/Expected Outcomes   Short Term Goals To learn and understand importance of monitoring SPO2 with pulse oximeter and demonstrate accurate use of the pulse oximeter.;To learn and understand importance of maintaining oxygen saturations>88%;To learn and demonstrate proper pursed lip breathing techniques or other breathing techniques. ;To learn and demonstrate proper use of respiratory medications       Long  Term Goals Verbalizes importance of monitoring SPO2 with pulse oximeter and return demonstration;Maintenance of O2 saturations>88%;Exhibits proper breathing techniques, such as pursed lip breathing or other method taught during program session;Compliance with respiratory  medication;Demonstrates proper use of MDI's       Comments Samika still has not gotten a pulse oximeter.  We talked about where to get them again.  She continues to work on her PLB and is doing well  with it in class.       Goals/Expected Outcomes Short; Get pulse oximeter Long: Continue to work on PLB  Oxygen Discharge (Final Oxygen Re-Evaluation):  Oxygen Re-Evaluation - 03/30/22 1004       Program Oxygen Prescription   Program Oxygen Prescription None      Home Oxygen   Home Oxygen Device None    Sleep Oxygen Prescription None    Home Exercise Oxygen Prescription None    Home Resting Oxygen Prescription None      Goals/Expected Outcomes   Short Term Goals To learn and understand importance of monitoring SPO2 with pulse oximeter and demonstrate accurate use of the pulse oximeter.;To learn and understand importance of maintaining oxygen saturations>88%;To learn and demonstrate proper pursed lip breathing techniques or other breathing techniques. ;To learn and demonstrate proper use of respiratory medications    Long  Term Goals Verbalizes importance of monitoring SPO2 with pulse oximeter and return demonstration;Maintenance of O2 saturations>88%;Exhibits proper breathing techniques, such as pursed lip breathing or other method taught during program session;Compliance with respiratory medication;Demonstrates proper use of MDI's    Comments Hartlynn still has not gotten a pulse oximeter.  We talked about where to get them again.  She continues to work on her PLB and is doing well  with it in class.    Goals/Expected Outcomes Short; Get pulse oximeter Long: Continue to work on PLB             Initial Exercise Prescription:  Initial Exercise Prescription - 12/15/21 1400       Date of Initial Exercise RX and Referring Provider   Date 12/15/21    Referring Provider Thad Ranger, MD      Oxygen   Maintain Oxygen Saturation 88% or higher      Treadmill   MPH 1.8     Grade 0.5    Minutes 15    METs 2.5      Recumbant Bike   Level 1    RPM 50    Watts 22    Minutes 15    METs 2.9      NuStep   Level 2    SPM 80    Minutes 15    METs 2.9      T5 Nustep   Level 1    SPM 80    Minutes 15    METs 2.9      Prescription Details   Frequency (times per week) 2    Duration Progress to 30 minutes of continuous aerobic without signs/symptoms of physical distress      Intensity   THRR 40-80% of Max Heartrate 100-140    Ratings of Perceived Exertion 11-13    Perceived Dyspnea 0-4      Progression   Progression Continue to progress workloads to maintain intensity without signs/symptoms of physical distress.      Resistance Training   Training Prescription Yes    Weight 3 lb    Reps 10-15             Perform Capillary Blood Glucose checks as needed.  Exercise Prescription Changes:   Exercise Prescription Changes     Row Name 12/15/21 1400 01/16/22 1100 01/30/22 1300 02/07/22 0900 02/15/22 1300     Response to Exercise   Blood Pressure (Admit) 118/62 118/72 122/68 -- 132/68   Blood Pressure (Exercise) 132/84 134/70 134/82 -- --   Blood Pressure (Exit) 120/70 122/82 126/64 -- 98/60   Heart Rate (Admit) 60 bpm 69 bpm 63 bpm -- 74 bpm   Heart Rate (Exercise) 96 bpm 96 bpm 94 bpm -- 98 bpm  Heart Rate (Exit) 67 bpm 69 bpm 72 bpm -- 72 bpm   Oxygen Saturation (Admit) 95 % 95 % 98 % -- 98 %   Oxygen Saturation (Exercise) 92 % 89 % 91 % -- 94 %   Oxygen Saturation (Exit) 97 % 98 % 95 % -- 94 %   Rating of Perceived Exertion (Exercise) 11 15 15 $ -- 13   Perceived Dyspnea (Exercise) 2 3 3 $ -- 1   Symptoms SOB, Fatigue SOB SOB -- SOB   Comments 6MWT Results -- -- -- --   Duration -- Progress to 30 minutes of  aerobic without signs/symptoms of physical distress Progress to 30 minutes of  aerobic without signs/symptoms of physical distress -- Continue with 30 min of aerobic exercise without signs/symptoms of physical distress.   Intensity  -- THRR unchanged THRR unchanged -- THRR unchanged     Progression   Progression -- Continue to progress workloads to maintain intensity without signs/symptoms of physical distress. Continue to progress workloads to maintain intensity without signs/symptoms of physical distress. -- Continue to progress workloads to maintain intensity without signs/symptoms of physical distress.   Average METs -- 1.88 2.53 -- 2.72     Resistance Training   Training Prescription -- Yes Yes -- Yes   Weight -- 3 lb 3 lb -- 3 lb   Reps -- 10-15 10-15 -- 10-15     Interval Training   Interval Training -- No No -- No     Treadmill   MPH -- 0.6 -- -- --   Grade -- 0.5 -- -- --   Minutes -- 15 -- -- --   METs -- 1.5 -- -- --     Recumbant Bike   Level -- 1 1 -- --   Watts -- 10 18 -- --   Minutes -- 15 15 -- --     NuStep   Level -- 2 3 -- 3   Minutes -- 15 15 -- 15   METs -- 2.1 2.6 -- 2.8     T5 Nustep   Level -- 1 1 -- --   Minutes -- 15 15 -- --   METs -- 1.9 1.9 -- --     Track   Laps -- 19 30 -- 30   Minutes -- 15 15 -- 15   METs -- 2.03 2.63 -- 2.63     Home Exercise Plan   Plans to continue exercise at -- -- -- Home (comment)  walking, staff videos Home (comment)  walking, staff videos   Frequency -- -- -- Add 3 additional days to program exercise sessions. Add 3 additional days to program exercise sessions.   Initial Home Exercises Provided -- -- -- 02/07/22 02/07/22     Oxygen   Maintain Oxygen Saturation -- 88% or higher 88% or higher -- 88% or higher    Row Name 02/27/22 1400 03/13/22 1200 03/27/22 1400 04/10/22 1100       Response to Exercise   Blood Pressure (Admit) 122/72 102/60 102/62 130/72    Blood Pressure (Exit) 118/62 110/60 102/64 122/74    Heart Rate (Admit) 73 bpm 75 bpm 75 bpm 79 bpm    Heart Rate (Exercise) 95 bpm 95 bpm 91 bpm 94 bpm    Heart Rate (Exit) 70 bpm 70 bpm 72 bpm 70 bpm    Oxygen Saturation (Admit) 96 % 95 % 97 % 97 %    Oxygen Saturation  (Exercise) 89 % 94 % 93 % 88 %  Oxygen Saturation (Exit) 97 % 96 % 96 % 95 %    Rating of Perceived Exertion (Exercise) 13 13 15 15    $ Perceived Dyspnea (Exercise) 1 1 2 $ 0    Symptoms SOB, knee pain SOB SOB none    Duration Continue with 30 min of aerobic exercise without signs/symptoms of physical distress. Continue with 30 min of aerobic exercise without signs/symptoms of physical distress. Continue with 30 min of aerobic exercise without signs/symptoms of physical distress. Continue with 30 min of aerobic exercise without signs/symptoms of physical distress.    Intensity THRR unchanged THRR unchanged THRR unchanged THRR unchanged      Progression   Progression Continue to progress workloads to maintain intensity without signs/symptoms of physical distress. Continue to progress workloads to maintain intensity without signs/symptoms of physical distress. Continue to progress workloads to maintain intensity without signs/symptoms of physical distress. Continue to progress workloads to maintain intensity without signs/symptoms of physical distress.    Average METs 2.86 2.54 2.51 2.45      Resistance Training   Training Prescription Yes Yes Yes Yes    Weight 3 lb 3 lb 3 lb 4 lb    Reps 10-15 10-15 10-15 10-15      Interval Training   Interval Training No No No No      Treadmill   MPH -- -- -- 0.6    Grade -- -- -- 0.5    Minutes -- -- -- 15    METs -- -- -- 1.5      Recumbant Bike   Level -- 3 -- --    Watts -- 15 -- --    Minutes -- 15 -- --    METs -- 2.59 -- --      NuStep   Level 3 4 2 3    $ Minutes 15 15 30 15    $ METs 3.3 2.7 2 2.2      Arm Ergometer   Level -- -- 2 3    Minutes -- -- 15 15    METs -- -- 1 2.7      Recumbant Elliptical   Level -- -- 1 --    Minutes -- -- 15 --    METs -- -- 1.8 --      T5 Nustep   Level 3 3 2 $ --    Minutes 15 30 15 $ --    METs 2 1.7 2.3 --      Biostep-RELP   Level -- -- -- 2    Minutes -- -- -- 15    METs -- -- -- 2       Track   Laps 42 60 35 30    Minutes 15 15 15 15    $ METs 3.28 4.26 2.9 2.63      Home Exercise Plan   Plans to continue exercise at Home (comment)  walking, staff videos Home (comment)  walking, staff videos Home (comment)  walking, staff videos Home (comment)  walking, staff videos    Frequency Add 3 additional days to program exercise sessions. Add 3 additional days to program exercise sessions. Add 3 additional days to program exercise sessions. Add 3 additional days to program exercise sessions.    Initial Home Exercises Provided 02/07/22 02/07/22 02/07/22 02/07/22      Oxygen   Maintain Oxygen Saturation 88% or higher 88% or higher 88% or higher 88% or higher             Exercise Comments:  Exercise Comments     Row Name 12/27/21 1040           Exercise Comments First full day of exercise!  Patient was oriented to gym and equipment including functions, settings, policies, and procedures.  Patient's individual exercise prescription and treatment plan were reviewed.  All starting workloads were established based on the results of the 6 minute walk test done at initial orientation visit.  The plan for exercise progression was also introduced and progression will be customized based on patient's performance and goals.                Exercise Goals and Review:   Exercise Goals     Row Name 12/15/21 1501             Exercise Goals   Increase Physical Activity Yes       Intervention Provide advice, education, support and counseling about physical activity/exercise needs.;Develop an individualized exercise prescription for aerobic and resistive training based on initial evaluation findings, risk stratification, comorbidities and participant's personal goals.       Expected Outcomes Short Term: Attend rehab on a regular basis to increase amount of physical activity.;Long Term: Add in home exercise to make exercise part of routine and to increase amount of physical  activity.;Long Term: Exercising regularly at least 3-5 days a week.       Increase Strength and Stamina Yes       Intervention Provide advice, education, support and counseling about physical activity/exercise needs.;Develop an individualized exercise prescription for aerobic and resistive training based on initial evaluation findings, risk stratification, comorbidities and participant's personal goals.       Expected Outcomes Short Term: Increase workloads from initial exercise prescription for resistance, speed, and METs.;Long Term: Improve cardiorespiratory fitness, muscular endurance and strength as measured by increased METs and functional capacity (6MWT);Short Term: Perform resistance training exercises routinely during rehab and add in resistance training at home       Able to understand and use rate of perceived exertion (RPE) scale Yes       Intervention Provide education and explanation on how to use RPE scale       Expected Outcomes Short Term: Able to use RPE daily in rehab to express subjective intensity level;Long Term:  Able to use RPE to guide intensity level when exercising independently       Able to understand and use Dyspnea scale Yes       Intervention Provide education and explanation on how to use Dyspnea scale       Expected Outcomes Long Term: Able to use Dyspnea scale to guide intensity level when exercising independently;Short Term: Able to use Dyspnea scale daily in rehab to express subjective sense of shortness of breath during exertion       Knowledge and understanding of Target Heart Rate Range (THRR) Yes       Intervention Provide education and explanation of THRR including how the numbers were predicted and where they are located for reference       Expected Outcomes Short Term: Able to state/look up THRR;Long Term: Able to use THRR to govern intensity when exercising independently;Short Term: Able to use daily as guideline for intensity in rehab       Able to check  pulse independently Yes       Intervention Provide education and demonstration on how to check pulse in carotid and radial arteries.;Review the importance of being able to check your own pulse for safety  during independent exercise       Expected Outcomes Short Term: Able to explain why pulse checking is important during independent exercise;Long Term: Able to check pulse independently and accurately       Understanding of Exercise Prescription Yes       Intervention Provide education, explanation, and written materials on patient's individual exercise prescription       Expected Outcomes Short Term: Able to explain program exercise prescription;Long Term: Able to explain home exercise prescription to exercise independently                Exercise Goals Re-Evaluation :  Exercise Goals Re-Evaluation     Row Name 12/27/21 1040 01/05/22 0959 01/16/22 1133 01/30/22 1356 02/02/22 1048     Exercise Goal Re-Evaluation   Exercise Goals Review Able to understand and use rate of perceived exertion (RPE) scale;Knowledge and understanding of Target Heart Rate Range (THRR);Able to understand and use Dyspnea scale;Able to check pulse independently;Understanding of Exercise Prescription Able to understand and use rate of perceived exertion (RPE) scale;Knowledge and understanding of Target Heart Rate Range (THRR);Able to understand and use Dyspnea scale;Able to check pulse independently;Understanding of Exercise Prescription Increase Physical Activity;Increase Strength and Stamina;Understanding of Exercise Prescription Increase Physical Activity;Increase Strength and Stamina;Understanding of Exercise Prescription Increase Physical Activity;Increase Strength and Stamina;Understanding of Exercise Prescription   Comments Reviewed RPE scale, THR and program prescription with pt today.  Pt voiced understanding and was given a copy of goals to take home. Laniece reports doing well in rehab, but reports not working out at  home right now becuase she is moving next Monday so she has been active. She has not met with EP yet to go over home exercise. Kanosh has had a good start to rehab the last couple of weeks. She was able to complete 19 laps on the track, she did try the treadmill which was more of a challenge for her and was not able to pace as fast as she was able to walk at a 0.6 mph speed. If she wants to walk on the treadmill, we hope to see that improve over time. The other machines she worked on she worked at her initial exercise prescription well. Will continue to monitor. Shawda is doing well in rehab. She recently improved her overall average MET level to 2.53 METs. She also increased her number of laps walked on the track to 30 laps. She improved to level 3 on the T4 as well. We will continue to monitor her progress in the program. Indyah is doing well in rehab.  She is not doing her home exercise yet.  She was waiting til she moved, but now that she has moved she is still not going.  We talked about getting up from TV and go for walk . She does feel liek her strength and stamina are improving.   Expected Outcomes Short: Use RPE daily to regulate intensity. Long: Follow program prescription in THR. Short: Meet with EP to go over home exercise Long: Follow program prescription in THR. Short: Continue to increase speed on treadmill Long: Continue to build up overall strength and stamina Short: Continue to push for more laps on the track. Long: Continue to increase strength and stamina. Short: Start to walk more in rehab Long: Continue to improve stamina    Row Name 02/07/22 0950 02/15/22 1325 02/23/22 0958 02/27/22 1422 03/13/22 1209     Exercise Goal Re-Evaluation   Exercise Goals Review Increase Physical Activity;Increase Strength and  Stamina;Understanding of Exercise Prescription;Able to understand and use rate of perceived exertion (RPE) scale;Knowledge and understanding of Target Heart Rate Range (THRR);Able to  understand and use Dyspnea scale;Able to check pulse independently Increase Physical Activity;Increase Strength and Stamina;Understanding of Exercise Prescription Increase Physical Activity;Increase Strength and Stamina;Understanding of Exercise Prescription Increase Physical Activity;Increase Strength and Stamina;Understanding of Exercise Prescription Increase Physical Activity;Increase Strength and Stamina;Understanding of Exercise Prescription   Comments Reviewed home exercise with pt today.  Pt plans to walk and use staff videos at home for exercise.  Reviewed THR, pulse, RPE, sign and symptoms, pulse oximetery and when to call 911 or MD.  Also discussed weather considerations and indoor options.  Pt voiced understanding.  We also talked about getting a pulse oximeter to monitor at home. Mariselda is doing well in rehab.  She is up to 30 laps now!!  We will encourage her to try more weights too.  We will continue to montior her progress. Tyquasha is doing well in rehab.  She went for a walk for the first time yesterday since it was a little warmer than it has been.  She has not noticed much change in her stamina but she thinks that it more related to her age versus breathing.  She is doing more and actually feels like going for walks on her off days. Debraann is doing well in rehab. She increased her overall average MET level to 2.86 METs. She also was able to improve to level 3 on the T5, and she walked up to 42 laps on the track. We will continue to monitor her progress in the program. Alexine is continuing to do well in rehab. She did walk a whole 60 laps on the track which is the most she has done so far! She increased to level 4 on the T4 Nustep and up to level 3 on the recumbent bike. We hope to see her increase overall watts. All O2 saturations are staying above 88%. Will continue to monitor.   Expected Outcomes Short: Start to add in walking at home and get pulse oximeter Long: Continue to improve stamina Short; Try 4  lb weights Long; Continue to improve stamina Short: Continue to walk more on her off days.  Long: Conitnue to improve stamina. Short: Consistently walk above 30 laps on the track. Long: Continue to improve strength and stamina. Short: Continue to increase watts on recumbent bike Long: Continue to increase overall stamina and MET level    Row Name 03/27/22 1414 03/30/22 1003 04/10/22 1155         Exercise Goal Re-Evaluation   Exercise Goals Review Increase Physical Activity;Increase Strength and Stamina;Understanding of Exercise Prescription Increase Physical Activity;Increase Strength and Stamina;Understanding of Exercise Prescription Increase Physical Activity;Increase Strength and Stamina;Understanding of Exercise Prescription     Comments Branya continues to do well in rehab. Her average overall MET level has stayed above 2.5 METs. She also began using the Recumbent elliptical at level 1 and the arm crank at level 2 which she did well with. She did walk less laps on the track as the most she walked since the last evaluation is 35 laps. We will continue to monitor her progress in the program. Kyari is doing well in rehab.  Tinaya admits to getting lazy and not walking like she was.  However, she is now using cart at store versus motorized cart!  She will get back to walking as it warms back up again. Lujain is doing well in rehab. She  did increase to level 3 on the arm ergometer. She tried out the treadmill again for the 2nd time and was not able to get her speed up enough to her normal walking pace, and could improve on increasing her walking speed on it. She is due for her post 6MWT and we hope to see improvement.     Expected Outcomes Short: Continue to push for more laps on the track. Long: Continue to improve strength and stamina. Short: Get back to walking again Long: Continue to improve stamina Short: Improve on post 6MWT Long: Continue to increase overall MET level and stamina               Discharge Exercise Prescription (Final Exercise Prescription Changes):  Exercise Prescription Changes - 04/10/22 1100       Response to Exercise   Blood Pressure (Admit) 130/72    Blood Pressure (Exit) 122/74    Heart Rate (Admit) 79 bpm    Heart Rate (Exercise) 94 bpm    Heart Rate (Exit) 70 bpm    Oxygen Saturation (Admit) 97 %    Oxygen Saturation (Exercise) 88 %    Oxygen Saturation (Exit) 95 %    Rating of Perceived Exertion (Exercise) 15    Perceived Dyspnea (Exercise) 0    Symptoms none    Duration Continue with 30 min of aerobic exercise without signs/symptoms of physical distress.    Intensity THRR unchanged      Progression   Progression Continue to progress workloads to maintain intensity without signs/symptoms of physical distress.    Average METs 2.45      Resistance Training   Training Prescription Yes    Weight 4 lb    Reps 10-15      Interval Training   Interval Training No      Treadmill   MPH 0.6    Grade 0.5    Minutes 15    METs 1.5      NuStep   Level 3    Minutes 15    METs 2.2      Arm Ergometer   Level 3    Minutes 15    METs 2.7      Biostep-RELP   Level 2    Minutes 15    METs 2      Track   Laps 30    Minutes 15    METs 2.63      Home Exercise Plan   Plans to continue exercise at Home (comment)   walking, staff videos   Frequency Add 3 additional days to program exercise sessions.    Initial Home Exercises Provided 02/07/22      Oxygen   Maintain Oxygen Saturation 88% or higher             Nutrition:  Target Goals: Understanding of nutrition guidelines, daily intake of sodium <1570m, cholesterol <20103m calories 30% from fat and 7% or less from saturated fats, daily to have 5 or more servings of fruits and vegetables.  Education: All About Nutrition: -Group instruction provided by verbal, written material, interactive activities, discussions, models, and posters to present general guidelines for heart healthy  nutrition including fat, fiber, MyPlate, the role of sodium in heart healthy nutrition, utilization of the nutrition label, and utilization of this knowledge for meal planning. Follow up email sent as well. Written material given at graduation. [facility]  Date 01/19/22  [nutrition 2]  Educator Irwin  Instruction Review Code 1- Verbalizes Understanding       Biometrics:  Pre Biometrics - 12/15/21 1501       Pre Biometrics   Height 5' 4"$  (1.626 m)    Weight 174 lb 4.8 oz (79.1 kg)    Waist Circumference 44.5 inches    Hip Circumference 42.5 inches    Waist to Hip Ratio 1.05 %    BMI (Calculated) 29.9    Single Leg Stand 11.9 seconds   R             Nutrition Therapy Plan and Nutrition Goals:  Nutrition Therapy & Goals - 12/15/21 1158       Nutrition Therapy   Diet Heart healthy, low Na, T2DM MNT    Drug/Food Interactions Statins/Certain Fruits    Protein (specify units) 90-100g    Fiber 25 grams    Whole Grain Foods 3 servings    Saturated Fats 14 max. grams    Fruits and Vegetables 8 servings/day    Sodium 2 grams      Personal Nutrition Goals   Nutrition Goal ST: read over materials, practice MyPlate guidelines, include balanced snacks during the day LT: Achieve and maintain A1C <7, eat consistent meals/snacks during the day (about 1-2 servings of CHO per snack and 3-5 servings of CHO per meals), include protein, fiber, and healthy fat at most meals/snacks    Comments 62 y.o. F admitted to pulmonary rehab for sarcoidosis. PMHx includes T2DM, GERD, DDD, HLD, osteoarthritis, former smoker. Relevant medications includes lipitor, vit D3, meclizine, metformin, pantoprazole, tizanidine. Last A1C 09/28/21 was 8.3. Latash had previously seen some regarding diabetes in nutrition this past summer, but has not been back. Discussed general heart healthy eating and T2DM MNT include pairing foods, quality and quantity of  CHOs, eating consistently, hydration, and exercise. Elgie feels that she does not eat very consistently right now - discussed some snacks that she could include like an apple with peanut butter.      Intervention Plan   Intervention Prescribe, educate and counsel regarding individualized specific dietary modifications aiming towards targeted core components such as weight, hypertension, lipid management, diabetes, heart failure and other comorbidities.;Nutrition handout(s) given to patient.    Expected Outcomes Short Term Goal: Understand basic principles of dietary content, such as calories, fat, sodium, cholesterol and nutrients.;Short Term Goal: A plan has been developed with personal nutrition goals set during dietitian appointment.;Long Term Goal: Adherence to prescribed nutrition plan.             Nutrition Assessments:  MEDIFICTS Score Key: ?70 Need to make dietary changes  40-70 Heart Healthy Diet ? 40 Therapeutic Level Cholesterol Diet  Flowsheet Row Pulmonary Rehab from 12/15/2021 in [facility]  Picture Your Plate Total Score on Admission 71      Picture Your Plate Scores: [id number] Unhealthy dietary pattern with much room for improvement. 41-50 Dietary pattern unlikely to meet recommendations for good health and room for improvement. 51-60 More healthful dietary pattern, with some room for improvement.  >60 Healthy dietary pattern, although there may be some specific behaviors that could be improved.   Nutrition Goals Re-Evaluation:  Nutrition Goals Re-Evaluation     Row Name 01/05/22 1009 02/02/22 1051 02/23/22 1002 03/30/22 0954       Goals   Nutrition Goal ST: read over materials, practice MyPlate guidelines, include balanced snacks during the day LT: Achieve and maintain A1C <7, eat consistent meals/snacks during the day (about  1-2 servings of CHO per snack and 3-5 servings of CHO per meals), include protein, fiber, and healthy fat at most  meals/snacks ST: read over materials, practice MyPlate guidelines, include balanced snacks during the day LT: Achieve and maintain A1C <7, eat consistent meals/snacks during the day (about 1-2 servings of CHO per snack and 3-5 servings of CHO per meals), include protein, fiber, and healthy fat at most meals/snacks Short: Add back in more fruit that is unsweetened Long: Continue to work on improving diet Short; Try canned fruit in its own juice Long: Conitnue to add in more protein    Comment Cartha reports doing well with her nutrition; she reports no changes since the last time we spoke. Encouraged to focus on the short term goals we addressed when we first spoke. Reviewed general heart healthy eating. Sevyn is doing well in rehab.  She has been watching her sodium and sugar closely.  She is now using Splenda versus sugar.  She has not been eating fruits and vegetables as she does not like to put her teeth in.  We talked about eating more canned stuff to help but to rinse them off.  She has canned fruit but been avoiding it.  We talked about getting it in fruit juice versus syrup to help with sugar.  We also talked about making sure she balances out with protein. She is also eating frank and beans a lot and we talked about how much sugar is in that. Rainn is doing well.  She is wearing her teeth now.  She says they are feeling better.  She is still not eating with them in as she cannot feel them as well when eating.  She says they feel really hard when she chews especially on the bottom.  She has continued to work on increasing her fruit.  She is now shopping for the better canned fruit but had a hard time finding it.  We talked about trying to find canned fruit in its own juice. Teralyn is doing well in rehab.  She is working on her diet.  She tries to avoid fried foods.  She is has not looked for the canned fruit/applesauce and no added sugar.  She came in eating a [employer] biscuits today as she was running late and  didn't have time to make her sausage and onion this morning.  She is getting in a lot of vegetables.   We talked about tring to get brown rice or wild rice.    Expected Outcome ST: read over materials, practice MyPlate guidelines, include balanced snacks during the day LT: Achieve and maintain A1C <7, eat consistent meals/snacks during the day (about 1-2 servings of CHO per snack and 3-5 servings of CHO per meals), include protein, fiber, and healthy fat at most meals/snacks Short: Add back in more fruit that is unsweetened Long: Continue to work on improving diet Short; Try canned fruit in its own juice Long: Conitnue to add in more protein Short: Find canned fruit Long: Continue to avoid simple carbs             Nutrition Goals Discharge (Final Nutrition Goals Re-Evaluation):  Nutrition Goals Re-Evaluation - 03/30/22 0954       Goals   Nutrition Goal Short; Try canned fruit in its own juice Long: Conitnue to add in more protein    Comment Aury is doing well in rehab.  She is working on her diet.  She tries to avoid fried foods.  She  is has not looked for the canned fruit/applesauce and no added sugar.  She came in eating a [employer] biscuits today as she was running late and didn't have time to make her sausage and onion this morning.  She is getting in a lot of vegetables.   We talked about tring to get brown rice or wild rice.    Expected Outcome Short: Find canned fruit Long: Continue to avoid simple carbs             Psychosocial: Target Goals: Acknowledge presence or absence of significant depression and/or stress, maximize coping skills, provide positive support system. Participant is able to verbalize types and ability to use techniques and skills needed for reducing stress and depression.   Education: Stress, Anxiety, and Depression - Group verbal and visual presentation to define topics covered.  Reviews how body is impacted by stress, anxiety, and depression.  Also discusses  healthy ways to reduce stress and to treat/manage anxiety and depression.  Written material given at graduation. Flowsheet Row Pulmonary Rehab from 04/06/2022 in [facility] Card[facility] and Pulmonary Rehab  Date 02/23/22  Educator [facility]  Instruction Review Code 1- [employer] Understanding       Education: Sleep Hygiene -Provides group verbal and written instruction about how sleep can affect your health.  Define sleep hygiene, discuss sleep cycles and impact of sleep habits. Review good sleep hygiene tips.    Initial Review & Psychosocial Screening:  Initial Psych Review & Screening - 12/12/21 1119       Initial Review   Current issues with None Identified      Family Dynamics   Good Support System? Yes    Comments Kairah can look to her sister inlaw and sons for support. She has church family and pastor for support as well. The normal stresses of daily life are her only stressors.      Barriers   Psychosocial barriers to participate in program The patient should benefit from training in stress management and relaxation.      Screening Interventions   Interventions Encouraged to exercise;To provide support and resources with identified psychosocial needs;Provide feedback about the scores to participant    Expected Outcomes Short Term goal: Utilizing psychosocial counselor, staff and physician to assist with identification of specific Stressors or current issues interfering with healing process. Setting desired goal for each stressor or current issue identified.;Long Term Goal: Stressors or current issues are controlled or eliminated.;Short Term goal: Identification and review with participant of any Quality of Life or Depression concerns found by scoring the questionnaire.;Long Term goal: The participant improves quality of Life and PHQ9 Scores as seen by post scores and/or verbalization of changes             Quality of Life Scores:  Scores of 19 and below usually indicate a poorer quality of  life in these areas.  A difference of  2-3 points is a clinically meaningful difference.  A difference of 2-3 points in the total score of the Quality of Life Index has been associated with significant improvement in overall quality of life, self-image, physical symptoms, and general health in studies assessing change in quality of life.  PHQ-9: Review Flowsheet  More data exists      03/27/2022 03/01/2022 01/27/2022 01/26/2022 01/23/2022  Depression screen PHQ 2/9  Decreased Interest 0 0 0 0 0  Down, Depressed, Hopeless 0 0 0 0 0  PHQ - 2 Score 0 0 0 0 0  Altered sleeping 0 0 0  0 0  Tired, decreased energy 0 0 0 0 0  Change in appetite 0 0 0 0 0  Feeling bad or failure about yourself  0 0 0 0 0  Trouble concentrating 0 0 0 0 0  Moving slowly or fidgety/restless 0 0 0 0 0  Suicidal thoughts 0 0 0 0 0  PHQ-9 Score 0 0 0 0 0  Difficult doing work/chores Not difficult at all Not difficult at all Not difficult at all Not difficult at all Not difficult at all   Interpretation of Total Score  Total Score Depression Severity:  1-4 = Minimal depression, 5-9 = Mild depression, 10-14 = Moderate depression, 15-19 = Moderately severe depression, 20-27 = Severe depression   Psychosocial Evaluation and Intervention:  Psychosocial Evaluation - 12/12/21 1122       Psychosocial Evaluation & Interventions   Interventions Encouraged to exercise with the program and follow exercise prescription;Relaxation education;Stress management education    Comments Latesa can look to her sister inlaw and sons for support. She has church family and pastor for support as well. The normal stresses of daily life are her only stressors.    Expected Outcomes Short: Start [facility] to help with mood. Long: Maintain a healthy mental state.    Continue Psychosocial Services  Follow up required by staff             Psychosocial Re-Evaluation:  Psychosocial Re-Evaluation     Row Name 01/05/22 1001 01/19/22 0955 02/02/22  1049 02/23/22 1000 03/30/22 1000     Psychosocial Re-Evaluation   Current issues with Current Stress Concerns Current Stress Concerns Current Stress Concerns Current Stress Concerns Current Stress Concerns   Comments Richlynn reports moving next Monday, she reports no other stressors at this time. She reports that she will sit down and take a minute to breathe to help with stress. Lyrah reports sleeping well. Tessalyn moved last week and is excited for her new house!!  Reviewed patient health questionnaire (PHQ-9) with patient for follow up. Previously, patients score indicated signs/symptoms of depression.  Reviewed to see if patient is improving symptom wise while in program.  Score improved and patient states that it is because they have moved into the house she has always wanted and feeling better overall. Chalena is doing well mentally.  She is in her new house and liking it!  She no longer has any major stressors going on.  She sleeps well overall.   She can tell rehab is helping her feel better overall. Ayra is doing well in rehab. She feels good mentally.  She got her new teeth dentures yesterday and she is getting used to them.  She is still doing well in her new house.  She enjoyed celebrating the holidays and birthday parties in it!  She is still sleeping well. Kameria is doing well in rehab.  She is trying to stay stress free.  Finances seem to continue to be her biggest stressor.  She tries not to let too much get to her.  She continues to enjoy the her new house.  She is sleeping well.   Expected Outcomes ST: continue to exercise for mental health boost LT: maintain posisitve attitude Short: Continue to attend [facility] regularly for regular exercise and social engagement. Long: Continue to improve symptoms and manage a positive mental state. Short: Continue to attend [facility] regularly for regular exercise and social engagement. Long: Continue to improve symptoms and manage a positive mental state. Short:  Continue to exercise  on off days for mental boost Long: Continue to stay positive Short; Continue to work on finances Long: Conitnue to focus on positive   Interventions -- Encouraged to attend Pulmonary Rehabilitation for the exercise;Stress management education Encouraged to attend Pulmonary Rehabilitation for the exercise Encouraged to attend Pulmonary Rehabilitation for the exercise Encouraged to attend Pulmonary Rehabilitation for the exercise   Continue Psychosocial Services  Follow up required by staff Follow up required by staff -- Follow up required by staff Follow up required by staff            Psychosocial Discharge (Final Psychosocial Re-Evaluation):  Psychosocial Re-Evaluation - 03/30/22 1000       Psychosocial Re-Evaluation   Current issues with Current Stress Concerns    Comments Malvika is doing well in rehab.  She is trying to stay stress free.  Finances seem to continue to be her biggest stressor.  She tries not to let too much get to her.  She continues to enjoy the her new house.  She is sleeping well.    Expected Outcomes Short; Continue to work on finances Long: Conitnue to focus on positive    Interventions Encouraged to attend Pulmonary Rehabilitation for the exercise    Continue Psychosocial Services  Follow up required by staff             Education: Education Goals: Education classes will be provided on a weekly basis, covering required topics. Participant will state understanding/return demonstration of topics presented.  Learning Barriers/Preferences:  Learning Barriers/Preferences - 12/12/21 1118       Learning Barriers/Preferences   Learning Barriers None    Learning Preferences None             General Pulmonary Education Topics:  Infection Prevention: - Provides verbal and written material to individual with discussion of infection control including proper hand washing and proper equipment cleaning during exercise session. Flowsheet Row  Pulmonary Rehab from 04/06/2022 in [facility] Cardiac and Pulmonary Rehab  Date 12/12/21  Educator [facility]  Instruction Review Code 1- Verbalizes Understanding       Falls Prevention: - Provides verbal and written material to individual with discussion of falls prevention and safety. Flowsheet Row Pulmonary Rehab from 04/06/2022 in [facility] Cardiac and Pulmonary Rehab  Date 12/12/21  Educator [facility]  Instruction Review Code 1- Verbalizes Understanding       Chronic Lung Disease Review: - Group verbal instruction with posters, models, PowerPoint presentations and videos,  to review new updates, new respiratory medications, new advancements in procedures and treatments. Providing information on websites and "800" numbers for continued self-education. Includes information about supplement oxygen, available portable oxygen systems, continuous and intermittent flow rates, oxygen safety, concentrators, and Medicare reimbursement for oxygen. Explanation of Pulmonary Drugs, including class, frequency, complications, importance of spacers, rinsing mouth after steroid MDI's, and proper cleaning methods for nebulizers. Review of basic lung anatomy and physiology related to function, structure, and complications of lung disease. Review of risk factors. Discussion about methods for diagnosing sleep apnea and types of masks and machines for OSA. Includes a review of the use of types of environmental controls: home humidity, furnaces, filters, dust mite/pet prevention, HEPA vacuums. Discussion about weather changes, air quality and the benefits of nasal washing. Instruction on Warning signs, infection symptoms, calling MD promptly, preventive modes, and value of vaccinations. Review of effective airway clearance, coughing and/or vibration techniques. Emphasizing that all should Create an Action Plan. Written material given at graduation. Flowsheet Row Pulmonary Rehab from 04/06/2022 in  [facility] Cardiac and Pulmonary Rehab  Education  need identified 12/15/21  Date 02/16/22  Educator [facility]  Instruction Review Code 1- Verbalizes Understanding       AED/CPR: - Group verbal and written instruction with the use of models to demonstrate the basic use of the AED with the basic ABC's of resuscitation.    Anatomy and Cardiac Procedures: - Group verbal and visual presentation and models provide information about basic cardiac anatomy and function. Reviews the testing methods done to diagnose heart disease and the outcomes of the test results. Describes the treatment choices: Medical Management, Angioplasty, or Coronary Bypass Surgery for treating various heart conditions including Myocardial Infarction, Angina, Valve Disease, and Cardiac Arrhythmias.  Written material given at graduation. Flowsheet Row Pulmonary Rehab from 04/06/2022 in [facility] Cardiac and Pulmonary Rehab  Date 01/26/22  Educator [facility]  Instruction Review Code 1- Verbalizes Understanding       Medication Safety: - Group verbal and visual instruction to review commonly prescribed medications for heart and lung disease. Reviews the medication, class of the drug, and side effects. Includes the steps to properly store meds and maintain the prescription regimen.  Written material given at graduation. Flowsheet Row Pulmonary Rehab from 04/06/2022 in [facility] Cardiac and Pulmonary Rehab  Date 02/02/22  Educator [facility]  Instruction Review Code 1- Verbalizes Understanding       Other: -Provides group and verbal instruction on various topics (see comments) Flowsheet Row Pulmonary Rehab from 04/06/2022 in [facility] Cardiac and Pulmonary Rehab  Date 04/06/22  Educator SB  Instruction Review Code 1- Verbalizes Understanding       Knowledge Questionnaire Score:  Knowledge Questionnaire Score - 12/15/21 1422       Knowledge Questionnaire Score   Pre Score 14/18              Core Components/Risk Factors/Patient Goals at Admission:  Personal Goals and Risk Factors at  Admission - 12/15/21 1427       Core Components/Risk Factors/Patient Goals on Admission    Weight Management Yes;Weight Loss    Intervention Weight Management: Develop a combined nutrition and exercise program designed to reach desired caloric intake, while maintaining appropriate intake of nutrient and fiber, sodium and fats, and appropriate energy expenditure required for the weight goal.;Weight Management: Provide education and appropriate resources to help participant work on and attain dietary goals.;Weight Management/Obesity: Establish reasonable short term and long term weight goals.    Admit Weight 174 lb 4.8 oz (79.1 kg)    Goal Weight: Short Term 170 lb (77.1 kg)    Goal Weight: Long Term 164 lb (74.4 kg)    Expected Outcomes Short Term: Continue to assess and modify interventions until short term weight is achieved;Long Term: Adherence to nutrition and physical activity/exercise program aimed toward attainment of established weight goal;Weight Loss: Understanding of general recommendations for a balanced deficit meal plan, which promotes 1-2 lb weight loss per week and includes a negative energy balance of [phone] kcal/d;Understanding recommendations for meals to include 15-35% energy as protein, 25-35% energy from fat, 35-60% energy from carbohydrates, less than 242m of dietary cholesterol, 20-35 gm of total fiber daily;Understanding of distribution of calorie intake throughout the day with the consumption of 4-5 meals/snacks    Improve shortness of breath with ADL's Yes    Intervention Provide education, individualized exercise plan and daily activity instruction to help decrease symptoms of SOB with activities of daily living.    Expected Outcomes Short Term: Improve cardiorespiratory fitness to achieve a reduction  of symptoms when performing ADLs;Long Term: Be able to perform more ADLs without symptoms or delay the onset of symptoms    Diabetes Yes    Intervention Provide education  about signs/symptoms and action to take for hypo/hyperglycemia.;Provide education about proper nutrition, including hydration, and aerobic/resistive exercise prescription along with prescribed medications to achieve blood glucose in normal ranges: Fasting glucose 65-99 mg/dL    Expected Outcomes Short Term: Participant verbalizes understanding of the signs/symptoms and immediate care of hyper/hypoglycemia, proper foot care and importance of medication, aerobic/resistive exercise and nutrition plan for blood glucose control.;Long Term: Attainment of HbA1C < 7%.             Education:Diabetes - Individual verbal and written instruction to review signs/symptoms of diabetes, desired ranges of glucose level fasting, after meals and with exercise. Acknowledge that pre and post exercise glucose checks will be done for 3 sessions at entry of program. Flowsheet Row Pulmonary Rehab from 04/06/2022 in [facility] Cardiac and Pulmonary Rehab  Date 12/12/21  Educator [facility]  Instruction Review Code 1- Verbalizes Understanding       Know Your Numbers and Heart Failure: - Group verbal and visual instruction to discuss disease risk factors for cardiac and pulmonary disease and treatment options.  Reviews associated critical values for Overweight/Obesity, Hypertension, Cholesterol, and Diabetes.  Discusses basics of heart failure: signs/symptoms and treatments.  Introduces Heart Failure Zone chart for action plan for heart failure.  Written material given at graduation.   Core Components/Risk Factors/Patient Goals Review:   Goals and Risk Factor Review     Row Name 01/05/22 1004 02/02/22 1054 02/23/22 1006 03/30/22 0958       Core Components/Risk Factors/Patient Goals Review   Personal Goals Review Diabetes;Improve shortness of breath with ADL's;Weight Management/Obesity Diabetes;Improve shortness of breath with ADL's;Weight Management/Obesity;Increase knowledge of respiratory medications and ability to use  respiratory devices properly. Diabetes;Improve shortness of breath with ADL's;Weight Management/Obesity;Increase knowledge of respiratory medications and ability to use respiratory devices properly.;Hypertension Diabetes;Improve shortness of breath with ADL's;Weight Management/Obesity;Increase knowledge of respiratory medications and ability to use respiratory devices properly.;Hypertension    Review Glender recently started rehab and reports doing well. She reports her shortness of breath will get worse by "doing to much" - she feels this more right now as she is moving next Monday. She does not check her O2 at home - encouraged to use a pulse oximeter. Raydene reports that she uses PLB and feels this helps. She reports her BG has been running around 100 at home in the am. She reports not taking her metformin this moring, but usually doing well with taking her medications as prescribed with no issues. Her weight has been stable since starting rehab. Sativa is doing well in rehab.  Her weigth is staying steady.  Her pressures have been good and sugars are doign much better.  She has been running in the 140-150s.  She had one day at 191 and she was very surprised to see but it all evened back out.  She is doing well on her meds but not consistent about using them the way she should. Juanette is doing well in rehab.  Her weight continues to stay steady.  She is able to do more now with less SOB.  She is doing well on her meds.  Her pressures are doing well and her sugars too.  She checks her sugars at night before dinner.  She will also check it if she feels off. Jelaine is doing well in  rehab.  Her weight is up some.  She had a doctor's appt the other day and he A1c was a 7.  She wants to work on getting it lower, so we talked about balancing her diet more.  Her pressures are doing well.  Her breathing is doing better overall and she just takes her inhalers.    Expected Outcomes ST: consider getting a pulse oximeter to check O2  at home LT: manage risk factors Short: Continue to keep eye on her sugar Long: Continue to monitor risk factors Short: conitnue to montior sugar levels Long: continue to work on weight loss Short: Conitnue to keep eye on sugars Long: Continue to work on weight loss.             Core Components/Risk Factors/Patient Goals at Discharge (Final Review):   Goals and Risk Factor Review - 03/30/22 0958       Core Components/Risk Factors/Patient Goals Review   Personal Goals Review Diabetes;Improve shortness of breath with ADL's;Weight Management/Obesity;Increase knowledge of respiratory medications and ability to use respiratory devices properly.;Hypertension    Review Khaylee is doing well in rehab.  Her weight is up some.  She had a doctor's appt the other day and he A1c was a 7.  She wants to work on getting it lower, so we talked about balancing her diet more.  Her pressures are doing well.  Her breathing is doing better overall and she just takes her inhalers.    Expected Outcomes Short: Conitnue to keep eye on sugars Long: Continue to work on weight loss.             ITP Comments:  ITP Comments     Row Name 12/12/21 1116 12/15/21 1421 12/21/21 0852 12/27/21 1039 01/18/22 0734   ITP Comments Virtual Visit completed. Patient informed on EP and RD appointment and 6 Minute walk test. Patient also informed of patient health questionnaires on My Chart. Patient Verbalizes understanding. Visit diagnosis can be found in [facility] 11/09/2021. Completed 6MWT and gym orientation. Initial ITP created and sent for review to Dr Ottie Glazier, Medical Director. 30 Day review completed. Medical Director ITP review done, changes made as directed, and signed approval by Medical Director.    NEW TO PROGRAM First full day of exercise!  Patient was oriented to gym and equipment including functions, settings, policies, and procedures.  Patient's individual exercise prescription and treatment plan were reviewed.  All  starting workloads were established based on the results of the 6 minute walk test done at initial orientation visit.  The plan for exercise progression was also introduced and progression will be customized based on patient's performance and goals. 30 Day review completed. Medical Director ITP review done, changes made as directed, and signed approval by Medical Director.   New to program    Row Name 02/15/22 0945 03/15/22 0859 04/12/22 0758       ITP Comments 30 Day review completed. Medical Director ITP review done, changes made as directed, and signed approval by Medical Director. 30 Day review completed. Medical Director ITP review done, changes made as directed, and signed approval by Medical Director. 30 day review completed. ITP sent to Dr. Zetta Bills, Medical Director of  Pulmonary Rehab. Continue with ITP unless changes are made by physician.              Comments: 30 day review

## 2022-04-13 ENCOUNTER — Encounter: Payer: BLUE CROSS/BLUE SHIELD | Admitting: *Deleted

## 2022-04-13 VITALS — Ht 64.0 in | Wt 180.6 lb

## 2022-04-13 DIAGNOSIS — D869 Sarcoidosis, unspecified: Secondary | ICD-10-CM | POA: Diagnosis not present

## 2022-04-13 NOTE — Progress Notes (Signed)
Daily Session Note  Patient Details  Name: Brenda Ellison MRN: [id number] Date of Birth: 02-11-1961 Referring Provider:   Flowsheet Row Pulmonary Rehab from 12/15/2021 in [facility] Cardiac and Pulmonary Rehab  Referring Provider Thad Ranger, MD       Encounter Date: 04/13/2022  Check In:  Session Check In - 04/13/22 1019       Check-In   Supervising physician immediately available to respond to emergencies See telemetry face sheet for immediately available ER MD    Location [facility]-Cardiac & Pulmonary Rehab    Staff Present Darlyne Russian, RN, ADN;Jessica Luan Pulling, MA, RCEP, CCRP, Bertram Gala, MS, ACSM CEP, Exercise Physiologist    Virtual Visit No    Medication changes reported     No    Fall or balance concerns reported    No    Warm-up and Cool-down Performed on first and last piece of equipment    Resistance Training Performed Yes    VAD Patient? No    PAD/SET Patient? No      Pain Assessment   Currently in Pain? No/denies                Social History   Tobacco Use  Smoking Status Former   Packs/day: 1.00   Years: 22.00   Total pack years: 22.00   Types: Cigarettes   Start date: 02/21/1988   Quit date: 02/20/2010   Years since quitting: 12.1  Smokeless Tobacco Never    Goals Met:  Independence with exercise equipment Exercise tolerated well No report of concerns or symptoms today Strength training completed today  Goals Unmet:  Not Applicable  Comments: Pt able to follow exercise prescription today without complaint.  Will continue to monitor for progression.   [place] Name 12/15/21 1448 04/13/22 1023       6 Minute Walk   Phase -- Discharge    Distance 1080 feet 1370 feet    Distance % Change -- 26.9 %    Distance Feet Change -- 290 ft    Walk Time 6 minutes 6 minutes    # of Rest Breaks 0 0    MPH 2.05 2.59    METS 2.9 3.7    RPE 11 13    Perceived Dyspnea  2 2    VO2 Peak 10.15 12.96    Symptoms Yes (comment) Yes  (comment)    Comments SOB, Fatigue SOB    Resting HR 60 bpm 65 bpm    Resting BP 118/62 128/62    Resting Oxygen Saturation  95 % 97 %    Exercise Oxygen Saturation  during 6 min walk 92 % 91 %    Max Ex. HR 96 bpm 112 bpm    Max Ex. BP 132/84 156/74    2 Minute Post BP 120/70 --      Interval HR   1 Minute HR 83 108    2 Minute HR 91 107    3 Minute HR 91 108    4 Minute HR 93 110    5 Minute HR 96 112    6 Minute HR 95 109    2 Minute Post HR 67 --    Interval Heart Rate? Yes Yes      Interval Oxygen   Interval Oxygen? Yes Yes    Baseline Oxygen Saturation % 95 % 97 %    1 Minute Oxygen Saturation % 95 % 92 %    1  Minute Liters of Oxygen 0 L  RA 0 L  Room Air    2 Minute Oxygen Saturation % 92 % 93 %    2 Minute Liters of Oxygen 0 L  RA 0 L    3 Minute Oxygen Saturation % 94 % 93 %    3 Minute Liters of Oxygen 0 L  RA 0 L    4 Minute Oxygen Saturation % 95 % 93 %    4 Minute Liters of Oxygen 0 L  RA 0 L    5 Minute Oxygen Saturation % 94 % 91 %    5 Minute Liters of Oxygen 0 L  RA 0 L    6 Minute Oxygen Saturation % 92 % 93 %    6 Minute Liters of Oxygen 0 L  RA 0 L    2 Minute Post Oxygen Saturation % 97 % --    2 Minute Post Liters of Oxygen 0 L  RA --              Dr. Emily Filbert is Medical Director for [place].  Dr. Ottie Glazier is Medical Director for [facility] Pulmonary Rehabilitation.

## 2022-04-18 ENCOUNTER — Encounter: Payer: BLUE CROSS/BLUE SHIELD | Admitting: *Deleted

## 2022-04-18 DIAGNOSIS — D869 Sarcoidosis, unspecified: Secondary | ICD-10-CM | POA: Diagnosis not present

## 2022-04-18 NOTE — Progress Notes (Signed)
Daily Session Note  Patient Details  Name: Brenda Ellison MRN: [id number] Date of Birth: 12-Aug-1960 Referring Provider:   Flowsheet Row Pulmonary Rehab from 12/15/2021 in [facility] Cardiac and Pulmonary Rehab  Referring Provider Thad Ranger, MD       Encounter Date: 04/18/2022  Check In:  Session Check In - 04/18/22 [id number]       Check-In   Supervising physician immediately available to respond to emergencies See telemetry face sheet for immediately available ER MD    Location [facility]-Cardiac & Pulmonary Rehab    Staff Present Renita Papa, RN BSN;Jessica Luan Pulling, MA, RCEP, CCRP, Bertram Gala, MS, ACSM CEP, Exercise Physiologist    Virtual Visit No    Medication changes reported     No    Fall or balance concerns reported    No    Warm-up and Cool-down Performed on first and last piece of equipment    Resistance Training Performed Yes    VAD Patient? No    PAD/SET Patient? No      Pain Assessment   Currently in Pain? No/denies                Social History   Tobacco Use  Smoking Status Former   Packs/day: 1.00   Years: 22.00   Total pack years: 22.00   Types: Cigarettes   Start date: 02/21/1988   Quit date: 02/20/2010   Years since quitting: 12.1  Smokeless Tobacco Never    Goals Met:  Independence with exercise equipment Exercise tolerated well No report of concerns or symptoms today Strength training completed today  Goals Unmet:  Not Applicable  Comments: Pt able to follow exercise prescription today without complaint.  Will continue to monitor for progression.    Dr. Emily Filbert is Medical Director for [place].  Dr. Ottie Glazier is Medical Director for [facility] Pulmonary Rehabilitation.

## 2022-04-20 ENCOUNTER — Encounter: Payer: BLUE CROSS/BLUE SHIELD | Admitting: *Deleted

## 2022-04-20 DIAGNOSIS — D869 Sarcoidosis, unspecified: Secondary | ICD-10-CM | POA: Diagnosis not present

## 2022-04-20 NOTE — Progress Notes (Signed)
Daily Session Note  Patient Details  Name: Brenda Ellison MRN: [id number] Date of Birth: September 04, 1960 Referring Provider:   April Manson Pulmonary Rehab from 12/15/2021 in [facility] Cardiac and Pulmonary Rehab  Referring Provider Thad Ranger, MD       Encounter Date: 04/20/2022  Check In:  Session Check In - 04/20/22 0954       Check-In   Supervising physician immediately available to respond to emergencies See telemetry face sheet for immediately available ER MD    Location [facility]-Cardiac & Pulmonary Rehab    Staff Present Alberteen Sam, MA, RCEP, CCRP, Bertram Gala, MS, ACSM CEP, Exercise Physiologist;Laureen Owens Shark, BS, RRT, CPFT;Megan Tamala Julian, RN, [place];Other   Darel Hong RN BSN   Virtual Visit No    Medication changes reported     No    Fall or balance concerns reported    No    Warm-up and Cool-down Performed on first and last piece of equipment    Resistance Training Performed Yes    VAD Patient? No    PAD/SET Patient? No      Pain Assessment   Currently in Pain? No/denies                Social History   Tobacco Use  Smoking Status Former   Packs/day: 1.00   Years: 22.00   Total pack years: 22.00   Types: Cigarettes   Start date: 02/21/1988   Quit date: 02/20/2010   Years since quitting: 12.1  Smokeless Tobacco Never    Goals Met:  Independence with exercise equipment Exercise tolerated well No report of concerns or symptoms today Strength training completed today  Goals Unmet:  Not Applicable  Comments: Pt able to follow exercise prescription today without complaint.  Will continue to monitor for progression.    Dr. Emily Filbert is Medical Director for [place].  Dr. Ottie Glazier is Medical Director for [facility] Pulmonary Rehabilitation.

## 2022-04-25 ENCOUNTER — Encounter: Payer: BLUE CROSS/BLUE SHIELD | Attending: Family Medicine | Admitting: *Deleted

## 2022-04-25 DIAGNOSIS — D869 Sarcoidosis, unspecified: Secondary | ICD-10-CM | POA: Diagnosis present

## 2022-04-25 DIAGNOSIS — Z87891 Personal history of nicotine dependence: Secondary | ICD-10-CM | POA: Insufficient documentation

## 2022-04-25 NOTE — Progress Notes (Signed)
Daily Session Note  Patient Details  Name: Brenda Ellison MRN: [id number] Date of Birth: 28-Dec-1960 Referring Provider:   Flowsheet Row Pulmonary Rehab from 12/15/2021 in [facility] Cardiac and Pulmonary Rehab  Referring Provider Thad Ranger, MD       Encounter Date: 04/25/2022  Check In:  Session Check In - 04/25/22 1038       Check-In   Supervising physician immediately available to respond to emergencies See telemetry face sheet for immediately available ER MD    Location [facility]-Cardiac & Pulmonary Rehab    Staff Present Heath Lark, RN, BSN, CCRP;Jessica [place], MA, RCEP, CCRP, Bertram Gala, MS, ACSM CEP, Exercise Physiologist    Virtual Visit No    Medication changes reported     No    Fall or balance concerns reported    No    Warm-up and Cool-down Performed on first and last piece of equipment    Resistance Training Performed Yes    VAD Patient? No    PAD/SET Patient? No      Pain Assessment   Currently in Pain? No/denies                Social History   Tobacco Use  Smoking Status Former   Packs/day: 1.00   Years: 22.00   Total pack years: 22.00   Types: Cigarettes   Start date: 02/21/1988   Quit date: 02/20/2010   Years since quitting: 12.1  Smokeless Tobacco Never    Goals Met:  Proper associated with RPD/PD & O2 Sat Independence with exercise equipment Exercise tolerated well No report of concerns or symptoms today  Goals Unmet:  Not Applicable  Comments: Pt able to follow exercise prescription today without complaint.  Will continue to monitor for progression.    Dr. Emily Filbert is Medical Director for [place].  Dr. Ottie Glazier is Medical Director for [facility] Pulmonary Rehabilitation.

## 2022-04-27 ENCOUNTER — Encounter: Payer: BLUE CROSS/BLUE SHIELD | Admitting: *Deleted

## 2022-04-27 DIAGNOSIS — D869 Sarcoidosis, unspecified: Secondary | ICD-10-CM

## 2022-04-27 NOTE — Progress Notes (Signed)
Daily Session Note  Patient Details  Name: Brenda Ellison MRN: [id number] Date of Birth: 1961-01-22 Referring Provider:   Flowsheet Row Pulmonary Rehab from 12/15/2021 in [facility] Cardiac and Pulmonary Rehab  Referring Provider Thad Ranger, MD       Encounter Date: 04/27/2022  Check In:  Session Check In - 04/27/22 1044       Check-In   Supervising physician immediately available to respond to emergencies See telemetry face sheet for immediately available ER MD    Location [facility]-Cardiac & Pulmonary Rehab    Staff Present Darlyne Russian, RN, ADN;Jessica Luan [place], MA, RCEP, CCRP, Bertram Gala, MS, ACSM CEP, Exercise Physiologist    Virtual Visit No    Medication changes reported     No    Fall or balance concerns reported    No    Warm-up and Cool-down Performed on first and last piece of equipment    Resistance Training Performed Yes    VAD Patient? No    PAD/SET Patient? No      Pain Assessment   Currently in Pain? No/denies                Social History   Tobacco Use  Smoking Status Former   Packs/day: 1.00   Years: 22.00   Total pack years: 22.00   Types: Cigarettes   Start date: 02/21/1988   Quit date: 02/20/2010   Years since quitting: 12.1  Smokeless Tobacco Never    Goals Met:  Independence with exercise equipment Exercise tolerated well No report of concerns or symptoms today Strength training completed today  Goals Unmet:  Not Applicable  Comments: Pt able to follow exercise prescription today without complaint.  Will continue to monitor for progression.    Dr. Emily Filbert is Medical Director for [place].  Dr. Ottie Glazier is Medical Director for [facility] Pulmonary Rehabilitation.

## 2022-05-01 ENCOUNTER — Ambulatory Visit: Payer: BLUE CROSS/BLUE SHIELD | Admitting: Family Medicine

## 2022-05-02 ENCOUNTER — Encounter: Payer: BLUE CROSS/BLUE SHIELD | Admitting: *Deleted

## 2022-05-02 DIAGNOSIS — D869 Sarcoidosis, unspecified: Secondary | ICD-10-CM | POA: Diagnosis not present

## 2022-05-02 NOTE — Patient Instructions (Addendum)
Discharge Patient Instructions  Patient Details  Name: Brenda Ellison MRN: [id number] Date of Birth: February 17, 1961 Referring Provider:  Delsa Grana, PA-C   Number of Visits: 73  Reason for Discharge:  Patient reached a stable level of exercise. Patient independent in their exercise. Patient has met program and personal goals.  Smoking History:  Social History   Tobacco Use  Smoking Status Former   Packs/day: 1.00   Years: 22.00   Total pack years: 22.00   Types: Cigarettes   Start date: 02/21/1988   Quit date: 02/20/2010   Years since quitting: 12.2  Smokeless Tobacco Never    Diagnosis:  No diagnosis found.  Initial Exercise Prescription:  Initial Exercise Prescription - 12/15/21 1400       Date of Initial Exercise RX and Referring Provider   Date 12/15/21    Referring Provider Thad Ranger, MD      Oxygen   Maintain Oxygen Saturation 88% or higher      Treadmill   MPH 1.8    Grade 0.5    Minutes 15    METs 2.5      Recumbant Bike   Level 1    RPM 50    Watts 22    Minutes 15    METs 2.9      NuStep   Level 2    SPM 80    Minutes 15    METs 2.9      T5 Nustep   Level 1    SPM 80    Minutes 15    METs 2.9      Prescription Details   Frequency (times per week) 2    Duration Progress to 30 minutes of continuous aerobic without signs/symptoms of physical distress      Intensity   THRR 40-80% of Max Heartrate 100-140    Ratings of Perceived Exertion 11-13    Perceived Dyspnea 0-4      Progression   Progression Continue to progress workloads to maintain intensity without signs/symptoms of physical distress.      Resistance Training   Training Prescription Yes    Weight 3 lb    Reps 10-15             Discharge Exercise Prescription (Final Exercise Prescription Changes):  Exercise Prescription Changes - 04/24/22 1400       Response to Exercise   Blood Pressure (Admit) 126/74    Blood Pressure (Exit) 128/68    Heart Rate (Admit) 67  bpm    Heart Rate (Exercise) 112 bpm    Heart Rate (Exit) 82 bpm    Oxygen Saturation (Admit) 95 %    Oxygen Saturation (Exercise) 91 %    Oxygen Saturation (Exit) 95 %    Rating of Perceived Exertion (Exercise) 15    Perceived Dyspnea (Exercise) 2    Symptoms SOB    Duration Continue with 30 min of aerobic exercise without signs/symptoms of physical distress.    Intensity THRR unchanged      Progression   Progression Continue to progress workloads to maintain intensity without signs/symptoms of physical distress.    Average METs 2.8      Resistance Training   Training Prescription Yes    Weight 4 lb    Reps 10-15      Interval Training   Interval Training No      NuStep   Level 3    Minutes 15    METs 3.5  Arm Ergometer   Level 3    Minutes 15    METs 2.3      Track   Laps 36    Minutes 15    METs 2.96      Home Exercise Plan   Plans to continue exercise at Home (comment)   walking, staff videos   Frequency Add 3 additional days to program exercise sessions.    Initial Home Exercises Provided 02/07/22      Oxygen   Maintain Oxygen Saturation 88% or higher             Functional Capacity:  6 Minute Walk     Row Name 12/15/21 1448 04/13/22 1023       6 Minute Walk   Phase -- Discharge    Distance 1080 feet 1370 feet    Distance % Change -- 26.9 %    Distance Feet Change -- 290 ft    Walk Time 6 minutes 6 minutes    # of Rest Breaks 0 0    MPH 2.05 2.59    METS 2.9 3.7    RPE 11 13    Perceived Dyspnea  2 2    VO2 Peak 10.15 12.96    Symptoms Yes (comment) Yes (comment)    Comments SOB, Fatigue SOB    Resting HR 60 bpm 65 bpm    Resting BP 118/62 128/62    Resting Oxygen Saturation  95 % 97 %    Exercise Oxygen Saturation  during 6 min walk 92 % 91 %    Max Ex. HR 96 bpm 112 bpm    Max Ex. BP 132/84 156/74    2 Minute Post BP 120/70 --      Interval HR   1 Minute HR 83 108    2 Minute HR 91 107    3 Minute HR 91 108    4 Minute HR  93 110    5 Minute HR 96 112    6 Minute HR 95 109    2 Minute Post HR 67 --    Interval Heart Rate? Yes Yes      Interval Oxygen   Interval Oxygen? Yes Yes    Baseline Oxygen Saturation % 95 % 97 %    1 Minute Oxygen Saturation % 95 % 92 %    1 Minute Liters of Oxygen 0 L  RA 0 L  Room Air    2 Minute Oxygen Saturation % 92 % 93 %    2 Minute Liters of Oxygen 0 L  RA 0 L    3 Minute Oxygen Saturation % 94 % 93 %    3 Minute Liters of Oxygen 0 L  RA 0 L    4 Minute Oxygen Saturation % 95 % 93 %    4 Minute Liters of Oxygen 0 L  RA 0 L    5 Minute Oxygen Saturation % 94 % 91 %    5 Minute Liters of Oxygen 0 L  RA 0 L    6 Minute Oxygen Saturation % 92 % 93 %    6 Minute Liters of Oxygen 0 L  RA 0 L    2 Minute Post Oxygen Saturation % 97 % --    2 Minute Post Liters of Oxygen 0 L  RA --            Nutrition & Weight - Outcomes:  Pre Biometrics - 12/15/21 1501  Pre Biometrics   Height '5\' 4"'$  (1.626 m)    Weight 174 lb 4.8 oz (79.1 kg)    Waist Circumference 44.5 inches    Hip Circumference 42.5 inches    Waist to Hip Ratio 1.05 %    BMI (Calculated) 29.9    Single Leg Stand 11.9 seconds   R            Post Biometrics - 04/13/22 1024        Post  Biometrics   Height '5\' 4"'$  (1.626 m)    Weight 180 lb 9.6 oz (81.9 kg)    Waist Circumference 40.5 inches    Hip Circumference 41 inches    Waist to Hip Ratio 0.99 %    BMI (Calculated) 30.98    Single Leg Stand 8.3 seconds             Nutrition:  Nutrition Therapy & Goals - 12/15/21 1158       Nutrition Therapy   Diet Heart healthy, low Na, T2DM MNT    Drug/Food Interactions Statins/Certain Fruits    Protein (specify units) 90-100g    Fiber 25 grams    Whole Grain Foods 3 servings    Saturated Fats 14 max. grams    Fruits and Vegetables 8 servings/day    Sodium 2 grams      Personal Nutrition Goals   Nutrition Goal ST: read over materials, practice MyPlate guidelines, include balanced snacks  during the day LT: Achieve and maintain A1C <7, eat consistent meals/snacks during the day (about 1-2 servings of CHO per snack and 3-5 servings of CHO per meals), include protein, fiber, and healthy fat at most meals/snacks    Comments 62 y.o. F admitted to pulmonary rehab for sarcoidosis. PMHx includes T2DM, GERD, DDD, HLD, osteoarthritis, former smoker. Relevant medications includes lipitor, vit D3, meclizine, metformin, pantoprazole, tizanidine. Last A1C 09/28/21 was 8.3. Kasia had previously seen some regarding diabetes in nutrition this past summer, but has not been back. Discussed general heart healthy eating and T2DM MNT include pairing foods, quality and quantity of CHOs, eating consistently, hydration, and exercise. Keyarah feels that she does not eat very consistently right now - discussed some snacks that she could include like an apple with peanut butter.      Intervention Plan   Intervention Prescribe, educate and counsel regarding individualized specific dietary modifications aiming towards targeted core components such as weight, hypertension, lipid management, diabetes, heart failure and other comorbidities.;Nutrition handout(s) given to patient.    Expected Outcomes Short Term Goal: Understand basic principles of dietary content, such as calories, fat, sodium, cholesterol and nutrients.;Short Term Goal: A plan has been developed with personal nutrition goals set during dietitian appointment.;Long Term Goal: Adherence to prescribed nutrition plan.           Goals reviewed with patient; copy given to patient.

## 2022-05-02 NOTE — Progress Notes (Signed)
Daily Session Note  Patient Details  Name: Brenda Ellison MRN: [id number] Date of Birth: 03-Mar-1960 Referring Provider:   Flowsheet Row Pulmonary Rehab from 12/15/2021 in [facility] Cardiac and Pulmonary Rehab  Referring Provider Thad Ranger, MD       Encounter Date: 05/02/2022  Check In:  Session Check In - 05/02/22 1038       Check-In   Supervising physician immediately available to respond to emergencies See telemetry face sheet for immediately available ER MD    Location [facility]-Cardiac & Pulmonary Rehab    Staff Present Heath Lark, RN, BSN, CCRP;Jessica [place], MA, RCEP, CCRP, Bertram Gala, MS, ACSM CEP, Exercise Physiologist    Virtual Visit No    Medication changes reported     No    Fall or balance concerns reported    No    Warm-up and Cool-down Performed on first and last piece of equipment    Resistance Training Performed Yes    VAD Patient? No    PAD/SET Patient? No      Pain Assessment   Currently in Pain? No/denies                Social History   Tobacco Use  Smoking Status Former   Packs/day: 1.00   Years: 22.00   Total pack years: 22.00   Types: Cigarettes   Start date: 02/21/1988   Quit date: 02/20/2010   Years since quitting: 12.2  Smokeless Tobacco Never    Goals Met:  Proper associated with RPD/PD & O2 Sat Independence with exercise equipment Exercise tolerated well Personal goals reviewed No report of concerns or symptoms today  Goals Unmet:  Not Applicable  Comments:  Maddilyn graduated today from  rehab with 36 sessions completed.  Details of the patient's exercise prescription and what She needs to do in order to continue the prescription and progress were discussed with patient.  Patient was given a copy of prescription and goals.  Patient verbalized understanding.  Caytlin plans to continue to exercise by Walking at home.    Dr. Emily Filbert is Medical Director for [place].  Dr. Ottie Glazier is Medical  Director for [facility] Pulmonary Rehabilitation.

## 2022-05-02 NOTE — Progress Notes (Signed)
Discharge Note  [name]    DOB: 10/16/1960  Brenda Ellison graduated today from  rehab with 36 sessions completed.  Details of the patient's exercise prescription and what She needs to do in order to continue the prescription and progress were discussed with patient.  Patient was given a copy of prescription and goals.  Patient verbalized understanding.  Trenita plans to continue to exercise by Walking at home.   [place] Name 12/15/21 1448 04/13/22 1023       6 Minute Walk   Phase -- Discharge    Distance 1080 feet 1370 feet    Distance % Change -- 26.9 %    Distance Feet Change -- 290 ft    Walk Time 6 minutes 6 minutes    # of Rest Breaks 0 0    MPH 2.05 2.59    METS 2.9 3.7    RPE 11 13    Perceived Dyspnea  2 2    VO2 Peak 10.15 12.96    Symptoms Yes (comment) Yes (comment)    Comments SOB, Fatigue SOB    Resting HR 60 bpm 65 bpm    Resting BP 118/62 128/62    Resting Oxygen Saturation  95 % 97 %    Exercise Oxygen Saturation  during 6 min walk 92 % 91 %    Max Ex. HR 96 bpm 112 bpm    Max Ex. BP 132/84 156/74    2 Minute Post BP 120/70 --      Interval HR   1 Minute HR 83 108    2 Minute HR 91 107    3 Minute HR 91 108    4 Minute HR 93 110    5 Minute HR 96 112    6 Minute HR 95 109    2 Minute Post HR 67 --    Interval Heart Rate? Yes Yes      Interval Oxygen   Interval Oxygen? Yes Yes    Baseline Oxygen Saturation % 95 % 97 %    1 Minute Oxygen Saturation % 95 % 92 %    1 Minute Liters of Oxygen 0 L  RA 0 L  Room Air    2 Minute Oxygen Saturation % 92 % 93 %    2 Minute Liters of Oxygen 0 L  RA 0 L    3 Minute Oxygen Saturation % 94 % 93 %    3 Minute Liters of Oxygen 0 L  RA 0 L    4 Minute Oxygen Saturation % 95 % 93 %    4 Minute Liters of Oxygen 0 L  RA 0 L    5 Minute Oxygen Saturation % 94 % 91 %    5 Minute Liters of Oxygen 0 L  RA 0 L    6 Minute Oxygen Saturation % 92 % 93 %    6 Minute Liters of Oxygen 0 L  RA 0 L    2 Minute Post Oxygen  Saturation % 97 % --    2 Minute Post Liters of Oxygen 0 L  RA --            Thank you for the referral

## 2022-05-02 NOTE — Progress Notes (Signed)
Pulmonary Individual Treatment Plan  Patient Details  Name: DAIANA FEDORCHAK MRN: [id number] Date of Birth: 1960/06/15 Referring Provider:   Flowsheet Row Pulmonary Rehab from 12/15/2021 in [facility] Cardiac and Pulmonary Rehab  Referring Provider Thad Ranger, MD       Initial Encounter Date:  Flowsheet Row Pulmonary Rehab from 12/15/2021 in [facility] Cardiac and Pulmonary Rehab  Date 12/15/21       Visit Diagnosis: Sarcoidosis  Patient's Home Medications on Admission:  Current Outpatient Medications:    albuterol (VENTOLIN HFA) 108 (90 Base) MCG/ACT inhaler, Inhale 2 puffs into the lungs every 4 (four) hours as needed for wheezing or shortness of breath., Disp: 18 g, Rfl: 3   albuterol (VENTOLIN HFA) 108 (90 Base) MCG/ACT inhaler, , Disp: , Rfl:    atorvastatin (LIPITOR) 20 MG tablet, TAKE 1 TABLET BY MOUTH AT BEDTIME, Disp: 30 tablet, Rfl: 8   benzonatate (TESSALON) 100 MG capsule, Take 1-2 capsules (100-200 mg total) by mouth 3 (three) times daily as needed for cough., Disp: 30 capsule, Rfl: 1   blood glucose meter kit and supplies, Dispense based on patient and insurance preference. Use up to four times daily as directed. (FOR ICD-10 E10.9, E11.9)., Disp: 1 each, Rfl: 0   budesonide-formoterol (SYMBICORT) 160-4.5 MCG/ACT inhaler, Inhale 2 puffs into the lungs 2 (two) times daily., Disp: 1 each, Rfl: 0   celecoxib (CELEBREX) 100 MG capsule, Take 1 capsule by mouth 2 (two) times daily., Disp: , Rfl:    cetirizine (ZYRTEC ALLERGY) 10 MG tablet, 1 tablet Orally Once a day for 30 day(s), Disp: , Rfl:    Cholecalciferol (VITAMIN D3) 2000 UNITS capsule, Take 2,000 Units by mouth daily., Disp: , Rfl:    dapagliflozin propanediol (FARXIGA) 5 MG TABS tablet, Take 1 tablet (5 mg total) by mouth daily before breakfast., Disp: 30 tablet, Rfl: 5   fluticasone (FLONASE) 50 MCG/ACT nasal spray, Place 2 sprays into both nostrils daily., Disp: 16 g, Rfl: 6   gabapentin (NEURONTIN) 100 MG capsule, Take 100  mg by mouth 2 (two) times daily., Disp: , Rfl:    levocetirizine (XYZAL) 5 MG tablet, Take 1 tablet (5 mg total) by mouth every evening., Disp: 90 tablet, Rfl: 1   meclizine (ANTIVERT) 12.5 MG tablet, Take 1 tablet (12.5 mg total) by mouth 3 (three) times daily as needed for dizziness., Disp: 30 tablet, Rfl: 0   metFORMIN (GLUCOPHAGE-XR) 500 MG 24 hr tablet, Take 1 tablet (500 mg total) by mouth 2 (two) times daily with a meal., Disp: 90 tablet, Rfl: 1   montelukast (SINGULAIR) 10 MG tablet, Take 1 tablet (10 mg total) by mouth at bedtime., Disp: 30 tablet, Rfl: 3   pantoprazole (PROTONIX) 20 MG tablet, Take 1 tablet (20 mg total) by mouth 2 (two) times daily. For stomach protection with NSAIDs and GERD, Disp: 60 tablet, Rfl: 1   promethazine-dextromethorphan (PROMETHAZINE-DM) 6.25-15 MG/5ML syrup, Take 2.5-5 mLs by mouth 3 (three) times daily as needed for cough., Disp: 118 mL, Rfl: 1   tiZANidine (ZANAFLEX) 4 MG tablet, Take 1 tablet (4 mg total) by mouth every 8 (eight) hours as needed for muscle spasms (muscle tightness/spasms)., Disp: 30 tablet, Rfl: 1  Past Medical History: Past Medical History:  Diagnosis Date   Arthritis pain    Bilateral leg pain    Chronic nasal congestion    Diabetes mellitus without complication (HCC)    Dizziness of unknown cause    Heart murmur previously undiagnosed    Osteoarthritis, hand  Prediabetes 07/12/2016    Tobacco Use: Social History   Tobacco Use  Smoking Status Former   Packs/day: 1.00   Years: 22.00   Total pack years: 22.00   Types: Cigarettes   Start date: 02/21/1988   Quit date: 02/20/2010   Years since quitting: 12.2  Smokeless Tobacco Never    Labs: Review Flowsheet  More data exists      Latest Ref Rng & Units 09/07/2020 06/23/2021 09/28/2021 12/26/2021 03/27/2022  Labs for ITP Cardiac and Pulmonary Rehab  Cholestrol <200 mg/dL - 151  - - -  LDL (calc) mg/dL (calc) - 69  - - -  HDL-C > OR = 50 mg/dL - 47  - - -  Trlycerides <150  mg/dL - 263  - - -  Hemoglobin A1c 4.0 - 5.6 % 6.5  8.0  8.3  6.1  7.0      Pulmonary Assessment Scores:  Pulmonary Assessment Scores     Row Name 12/15/21 1423 04/13/22 1025 04/18/22 1006     ADL UCSD   ADL Phase Entry Exit Exit   SOB Score total 56 -- 53   Rest 0 -- 2   Walk 3 -- 3   Stairs 5 -- 5   Bath 3 -- 3   Dress 3 -- 3   Shop 4 -- 4     CAT Score   CAT Score 15 -- 26     mMRC Score   mMRC Score 3  updated 2/22 as patient had better understanding of scale 1 --            UCSD: Self-administered rating of dyspnea associated with activities of daily living (ADLs) 6-point scale (0 = "not at all" to 5 = "maximal or unable to do because of breathlessness")  Scoring Scores range from 0 to 120.  Minimally important difference is 5 units  CAT: CAT can identify the health impairment of COPD patients and is better correlated with disease progression.  CAT has a scoring range of zero to 40. The CAT score is classified into four groups of low (less than 10), medium (10 - 20), high (21-30) and very high (31-40) based on the impact level of disease on health status. A CAT score over 10 suggests significant symptoms.  A worsening CAT score could be explained by an exacerbation, poor medication adherence, poor inhaler technique, or progression of COPD or comorbid conditions.  CAT MCID is 2 points  mMRC: mMRC (Modified Medical Research Council) Dyspnea Scale is used to assess the degree of baseline functional disability in patients of respiratory disease due to dyspnea. No minimal important difference is established. A decrease in score of 1 point or greater is considered a positive change.   Pulmonary Function Assessment:  Pulmonary Function Assessment - 12/12/21 1118       Breath   Shortness of Breath Yes;Limiting activity             Exercise Target Goals: Exercise Program Goal: Individual exercise prescription set using results from initial 6 min walk test and  THRR while considering  patient's activity barriers and safety.   Exercise Prescription Goal: Initial exercise prescription builds to 30-45 minutes a day of aerobic activity, 2-3 days per week.  Home exercise guidelines will be given to patient during program as part of exercise prescription that the participant will acknowledge.  Education: Aerobic Exercise: - Group verbal and visual presentation on the components of exercise prescription. Introduces F.I.T.T principle from ACSM for exercise  prescriptions.  Reviews F.I.T.T. principles of aerobic exercise including progression. Written material given at graduation. Flowsheet Row Pulmonary Rehab from 04/27/2022 in [facility] Cardiac and Pulmonary Rehab  Education need identified 12/15/21  Date 03/09/22  Educator [facility]  Instruction Review Code 1- Verbalizes Understanding       Education: Resistance Exercise: - Group verbal and visual presentation on the components of exercise prescription. Introduces F.I.T.T principle from ACSM for exercise prescriptions  Reviews F.I.T.T. principles of resistance exercise including progression. Written material given at graduation. Flowsheet Row Pulmonary Rehab from 04/27/2022 in [facility] Cardiac and Pulmonary Rehab  Date 03/16/22  Educator [facility]  Instruction Review Code 1- Verbalizes Understanding        Education: Exercise & Equipment Safety: - Individual verbal instruction and demonstration of equipment use and safety with use of the equipment. Flowsheet Row Pulmonary Rehab from 04/27/2022 in [facility] Cardiac and Pulmonary Rehab  Date 12/12/21  Educator [facility]  Instruction Review Code 1- Verbalizes Understanding       Education: Exercise Physiology & General Exercise Guidelines: - Group verbal and written instruction with models to review the exercise physiology of the cardiovascular system and associated critical values. Provides general exercise guidelines with specific guidelines to those with heart or lung disease.   Flowsheet Row Pulmonary Rehab from 04/27/2022 in [facility] Cardiac and Pulmonary Rehab  Date 03/02/22  Educator [facility]  Instruction Review Code 1- Verbalizes Understanding       Education: Flexibility, Balance, Mind/Body Relaxation: - Group verbal and visual presentation with interactive activity on the components of exercise prescription. Introduces F.I.T.T principle from ACSM for exercise prescriptions. Reviews F.I.T.T. principles of flexibility and balance exercise training including progression. Also discusses the mind body connection.  Reviews various relaxation techniques to help reduce and manage stress (i.e. Deep breathing, progressive muscle relaxation, and visualization). Balance handout provided to take home. Written material given at graduation. Flowsheet Row Pulmonary Rehab from 04/27/2022 in [facility] Cardiac and Pulmonary Rehab  Date 03/23/22  Educator [facility]  Instruction Review Code 1- Verbalizes Understanding       Activity Barriers & Risk Stratification:  Activity Barriers & Cardiac Risk Stratification - 12/15/21 1456       Activity Barriers & Cardiac Risk Stratification   Activity Barriers Deconditioning;Shortness of Breath             6 Minute Walk:  6 Minute Walk     Row Name 12/15/21 1448 04/13/22 1023       6 Minute Walk   Phase -- Discharge    Distance 1080 feet 1370 feet    Distance % Change -- 26.9 %    Distance Feet Change -- 290 ft    Walk Time 6 minutes 6 minutes    # of Rest Breaks 0 0    MPH 2.05 2.59    METS 2.9 3.7    RPE 11 13    Perceived Dyspnea  2 2    VO2 Peak 10.15 12.96    Symptoms Yes (comment) Yes (comment)    Comments SOB, Fatigue SOB    Resting HR 60 bpm 65 bpm    Resting BP 118/62 128/62    Resting Oxygen Saturation  95 % 97 %    Exercise Oxygen Saturation  during 6 min walk 92 % 91 %    Max Ex. HR 96 bpm 112 bpm    Max Ex. BP 132/84 156/74    2 Minute Post BP 120/70 --      Interval HR  1 Minute HR 83 108    2 Minute HR 91 107     3 Minute HR 91 108    4 Minute HR 93 110    5 Minute HR 96 112    6 Minute HR 95 109    2 Minute Post HR 67 --    Interval Heart Rate? Yes Yes      Interval Oxygen   Interval Oxygen? Yes Yes    Baseline Oxygen Saturation % 95 % 97 %    1 Minute Oxygen Saturation % 95 % 92 %    1 Minute Liters of Oxygen 0 L  RA 0 L  Room Air    2 Minute Oxygen Saturation % 92 % 93 %    2 Minute Liters of Oxygen 0 L  RA 0 L    3 Minute Oxygen Saturation % 94 % 93 %    3 Minute Liters of Oxygen 0 L  RA 0 L    4 Minute Oxygen Saturation % 95 % 93 %    4 Minute Liters of Oxygen 0 L  RA 0 L    5 Minute Oxygen Saturation % 94 % 91 %    5 Minute Liters of Oxygen 0 L  RA 0 L    6 Minute Oxygen Saturation % 92 % 93 %    6 Minute Liters of Oxygen 0 L  RA 0 L    2 Minute Post Oxygen Saturation % 97 % --    2 Minute Post Liters of Oxygen 0 L  RA --            Oxygen Initial Assessment:  Oxygen Initial Assessment - 12/27/21 1040       Home Oxygen   Home Oxygen Device None    Sleep Oxygen Prescription None    Home Exercise Oxygen Prescription None    Home Resting Oxygen Prescription None    Compliance with Home Oxygen Use Yes      Intervention   Short Term Goals To learn and demonstrate proper pursed lip breathing techniques or other breathing techniques.     Long  Term Goals Exhibits proper breathing techniques, such as pursed lip breathing or other method taught during program session             Oxygen Re-Evaluation:  Oxygen Re-Evaluation     Row Name 12/27/21 1041 01/05/22 1008 02/02/22 1101 02/23/22 1008 03/23/22 1207     Program Oxygen Prescription   Program Oxygen Prescription -- None None None None     Home Oxygen   Home Oxygen Device -- None None None None   Sleep Oxygen Prescription -- None None None None   Home Exercise Oxygen Prescription -- None None None None   Home Resting Oxygen Prescription -- None None None None   Compliance with Home Oxygen Use -- Yes No -- --      Goals/Expected Outcomes   Short Term Goals -- To learn and demonstrate proper pursed lip breathing techniques or other breathing techniques.  To learn and demonstrate proper pursed lip breathing techniques or other breathing techniques. ;To learn and understand importance of monitoring SPO2 with pulse oximeter and demonstrate accurate use of the pulse oximeter.;To learn and understand importance of maintaining oxygen saturations>88% To learn and demonstrate proper pursed lip breathing techniques or other breathing techniques. ;To learn and understand importance of monitoring SPO2 with pulse oximeter and demonstrate accurate use of the pulse oximeter.;To learn and understand importance  of maintaining oxygen saturations>88% To learn and demonstrate proper pursed lip breathing techniques or other breathing techniques.    Long  Term Goals -- Exhibits proper breathing techniques, such as pursed lip breathing or other method taught during program session Verbalizes importance of monitoring SPO2 with pulse oximeter and return demonstration;Maintenance of O2 saturations>88%;Exhibits proper breathing techniques, such as pursed lip breathing or other method taught during program session;Compliance with respiratory medication Verbalizes importance of monitoring SPO2 with pulse oximeter and return demonstration;Maintenance of O2 saturations>88%;Exhibits proper breathing techniques, such as pursed lip breathing or other method taught during program session;Compliance with respiratory medication Exhibits proper breathing techniques, such as pursed lip breathing or other method taught during program session   Comments Reviewed PLB technique with pt.  Talked about how it works and it's importance in maintaining their exercise saturations. Shaletha recently started rehab and reports doing well. She reports her shortness of breath will get worse by "doing to much" - she feels this more right now as she is moving next Monday. She  does not check her O2 at home - encouraged to use a pulse oximeter. Mildred reports that she uses PLB and feels this helps Tajuanna has not been using her inhalers.  She also has not used her nebulizer recently.  We talked about the importance of using her meds the right way.  She said she will work on getting back into the habit.  She has also not been using her PLB so we reviewed it again. Cariyah is doing well in rehab.  She has not gotten a pulse oximeter yet.  She wants to go get one along with weights.  She is back to using inhalers but still not sure if they are helping. Diaphragmatic and PLB breathing explained and performed with patient. Patient has a better understanding of how to do these exercises to help with breathing performance and relaxation. Patient performed breathing techniques adequately and to practice further at home.   Goals/Expected Outcomes Short: Become more profiecient at using PLB. Long: Become independent at using PLB. Short: Become more profiecient at using PLB, consider getting a pulse oximeter  Long: Become independent at using PLB. Short: Get back to using meds and PLB Long Continue to improve compliance with meds Short: Get pulse oximeter Long: Contineu to use inhalers consistently Short: practice PLB and diaphragmatic breathing at home. Long: Use PLB and diaphragmatic breathing independently post [employer].    [place] Name 03/30/22 1004 04/20/22 1042           Program Oxygen Prescription   Program Oxygen Prescription None None        Home Oxygen   Home Oxygen Device None None      Sleep Oxygen Prescription None None      Home Exercise Oxygen Prescription None None      Home Resting Oxygen Prescription None None        Goals/Expected Outcomes   Short Term Goals To learn and understand importance of monitoring SPO2 with pulse oximeter and demonstrate accurate use of the pulse oximeter.;To learn and understand importance of maintaining oxygen saturations>88%;To learn and  demonstrate proper pursed lip breathing techniques or other breathing techniques. ;To learn and demonstrate proper use of respiratory medications To learn and understand importance of monitoring SPO2 with pulse oximeter and demonstrate accurate use of the pulse oximeter.;To learn and understand importance of maintaining oxygen saturations>88%;To learn and demonstrate proper pursed lip breathing techniques or other breathing techniques. ;To learn and demonstrate proper use of  respiratory medications      Long  Term Goals Verbalizes importance of monitoring SPO2 with pulse oximeter and return demonstration;Maintenance of O2 saturations>88%;Exhibits proper breathing techniques, such as pursed lip breathing or other method taught during program session;Compliance with respiratory medication;Demonstrates proper use of MDI's Verbalizes importance of monitoring SPO2 with pulse oximeter and return demonstration;Maintenance of O2 saturations>88%;Exhibits proper breathing techniques, such as pursed lip breathing or other method taught during program session;Compliance with respiratory medication;Demonstrates proper use of MDI's      Comments Winny still has not gotten a pulse oximeter.  We talked about where to get them again.  She continues to work on her PLB and is doing well  with it in class. Emberly continues to do well in rehab.  Her sats have been good in rehab.  She continues to use her PLB as well.      Goals/Expected Outcomes Short; Get pulse oximeter Long: Continue to work on PLB Get pulse oximeter to montior saturations at home.               Oxygen Discharge (Final Oxygen Re-Evaluation):  Oxygen Re-Evaluation - 04/20/22 1042       Program Oxygen Prescription   Program Oxygen Prescription None      Home Oxygen   Home Oxygen Device None    Sleep Oxygen Prescription None    Home Exercise Oxygen Prescription None    Home Resting Oxygen Prescription None      Goals/Expected Outcomes   Short Term  Goals To learn and understand importance of monitoring SPO2 with pulse oximeter and demonstrate accurate use of the pulse oximeter.;To learn and understand importance of maintaining oxygen saturations>88%;To learn and demonstrate proper pursed lip breathing techniques or other breathing techniques. ;To learn and demonstrate proper use of respiratory medications    Long  Term Goals Verbalizes importance of monitoring SPO2 with pulse oximeter and return demonstration;Maintenance of O2 saturations>88%;Exhibits proper breathing techniques, such as pursed lip breathing or other method taught during program session;Compliance with respiratory medication;Demonstrates proper use of MDI's    Comments Fern continues to do well in rehab.  Her sats have been good in rehab.  She continues to use her PLB as well.    Goals/Expected Outcomes Get pulse oximeter to montior saturations at home.             Initial Exercise Prescription:  Initial Exercise Prescription - 12/15/21 1400       Date of Initial Exercise RX and Referring Provider   Date 12/15/21    Referring Provider Thad Ranger, MD      Oxygen   Maintain Oxygen Saturation 88% or higher      Treadmill   MPH 1.8    Grade 0.5    Minutes 15    METs 2.5      Recumbant Bike   Level 1    RPM 50    Watts 22    Minutes 15    METs 2.9      NuStep   Level 2    SPM 80    Minutes 15    METs 2.9      T5 Nustep   Level 1    SPM 80    Minutes 15    METs 2.9      Prescription Details   Frequency (times per week) 2    Duration Progress to 30 minutes of continuous aerobic without signs/symptoms of physical distress      Intensity  THRR 40-80% of Max Heartrate 100-140    Ratings of Perceived Exertion 11-13    Perceived Dyspnea 0-4      Progression   Progression Continue to progress workloads to maintain intensity without signs/symptoms of physical distress.      Resistance Training   Training Prescription Yes    Weight 3 lb     Reps 10-15             Perform Capillary Blood Glucose checks as needed.  Exercise Prescription Changes:   Exercise Prescription Changes     Row Name 12/15/21 1400 01/16/22 1100 01/30/22 1300 02/07/22 0900 02/15/22 1300     Response to Exercise   Blood Pressure (Admit) 118/62 118/72 122/68 -- 132/68   Blood Pressure (Exercise) 132/84 134/70 134/82 -- --   Blood Pressure (Exit) 120/70 122/82 126/64 -- 98/60   Heart Rate (Admit) 60 bpm 69 bpm 63 bpm -- 74 bpm   Heart Rate (Exercise) 96 bpm 96 bpm 94 bpm -- 98 bpm   Heart Rate (Exit) 67 bpm 69 bpm 72 bpm -- 72 bpm   Oxygen Saturation (Admit) 95 % 95 % 98 % -- 98 %   Oxygen Saturation (Exercise) 92 % 89 % 91 % -- 94 %   Oxygen Saturation (Exit) 97 % 98 % 95 % -- 94 %   Rating of Perceived Exertion (Exercise) '11 15 15 '$ -- 13   Perceived Dyspnea (Exercise) '2 3 3 '$ -- 1   Symptoms SOB, Fatigue SOB SOB -- SOB   Comments 6MWT Results -- -- -- --   Duration -- Progress to 30 minutes of  aerobic without signs/symptoms of physical distress Progress to 30 minutes of  aerobic without signs/symptoms of physical distress -- Continue with 30 min of aerobic exercise without signs/symptoms of physical distress.   Intensity -- THRR unchanged THRR unchanged -- THRR unchanged     Progression   Progression -- Continue to progress workloads to maintain intensity without signs/symptoms of physical distress. Continue to progress workloads to maintain intensity without signs/symptoms of physical distress. -- Continue to progress workloads to maintain intensity without signs/symptoms of physical distress.   Average METs -- 1.88 2.53 -- 2.72     Resistance Training   Training Prescription -- Yes Yes -- Yes   Weight -- 3 lb 3 lb -- 3 lb   Reps -- 10-15 10-15 -- 10-15     Interval Training   Interval Training -- No No -- No     Treadmill   MPH -- 0.6 -- -- --   Grade -- 0.5 -- -- --   Minutes -- 15 -- -- --   METs -- 1.5 -- -- --     Recumbant Bike    Level -- 1 1 -- --   Watts -- 10 18 -- --   Minutes -- 15 15 -- --     NuStep   Level -- 2 3 -- 3   Minutes -- 15 15 -- 15   METs -- 2.1 2.6 -- 2.8     T5 Nustep   Level -- 1 1 -- --   Minutes -- 15 15 -- --   METs -- 1.9 1.9 -- --     Track   Laps -- 19 30 -- 30   Minutes -- 15 15 -- 15   METs -- 2.03 2.63 -- 2.63     Home Exercise Plan   Plans to continue exercise at -- -- -- Home (  comment)  walking, staff videos Home (comment)  walking, staff videos   Frequency -- -- -- Add 3 additional days to program exercise sessions. Add 3 additional days to program exercise sessions.   Initial Home Exercises Provided -- -- -- 02/07/22 02/07/22     Oxygen   Maintain Oxygen Saturation -- 88% or higher 88% or higher -- 88% or higher    Row Name 02/27/22 1400 03/13/22 1200 03/27/22 1400 04/10/22 1100 04/24/22 1400     Response to Exercise   Blood Pressure (Admit) 122/72 102/60 102/62 130/72 126/74   Blood Pressure (Exit) 118/62 110/60 102/64 122/74 128/68   Heart Rate (Admit) 73 bpm 75 bpm 75 bpm 79 bpm 67 bpm   Heart Rate (Exercise) 95 bpm 95 bpm 91 bpm 94 bpm 112 bpm   Heart Rate (Exit) 70 bpm 70 bpm 72 bpm 70 bpm 82 bpm   Oxygen Saturation (Admit) 96 % 95 % 97 % 97 % 95 %   Oxygen Saturation (Exercise) 89 % 94 % 93 % 88 % 91 %   Oxygen Saturation (Exit) 97 % 96 % 96 % 95 % 95 %   Rating of Perceived Exertion (Exercise) '13 13 15 15 15   '$ Perceived Dyspnea (Exercise) '1 1 2 '$ 0 2   Symptoms SOB, knee pain SOB SOB none SOB   Duration Continue with 30 min of aerobic exercise without signs/symptoms of physical distress. Continue with 30 min of aerobic exercise without signs/symptoms of physical distress. Continue with 30 min of aerobic exercise without signs/symptoms of physical distress. Continue with 30 min of aerobic exercise without signs/symptoms of physical distress. Continue with 30 min of aerobic exercise without signs/symptoms of physical distress.   Intensity THRR unchanged THRR  unchanged THRR unchanged THRR unchanged THRR unchanged     Progression   Progression Continue to progress workloads to maintain intensity without signs/symptoms of physical distress. Continue to progress workloads to maintain intensity without signs/symptoms of physical distress. Continue to progress workloads to maintain intensity without signs/symptoms of physical distress. Continue to progress workloads to maintain intensity without signs/symptoms of physical distress. Continue to progress workloads to maintain intensity without signs/symptoms of physical distress.   Average METs 2.86 2.54 2.51 2.45 2.8     Resistance Training   Training Prescription Yes Yes Yes Yes Yes   Weight 3 lb 3 lb 3 lb 4 lb 4 lb   Reps 10-15 10-15 10-15 10-15 10-15     Interval Training   Interval Training No No No No No     Treadmill   MPH -- -- -- 0.6 --   Grade -- -- -- 0.5 --   Minutes -- -- -- 15 --   METs -- -- -- 1.5 --     Recumbant Bike   Level -- 3 -- -- --   Watts -- 15 -- -- --   Minutes -- 15 -- -- --   METs -- 2.59 -- -- --     NuStep   Level '3 4 2 3 3   '$ Minutes '15 15 30 15 15   '$ METs 3.3 2.7 2 2.2 3.5     Arm Ergometer   Level -- -- '2 3 3   '$ Minutes -- -- '15 15 15   '$ METs -- -- 1 2.7 2.3     Recumbant Elliptical   Level -- -- 1 -- --   Minutes -- -- 15 -- --   METs -- -- 1.8 -- --  T5 Nustep   Level '3 3 2 '$ -- --   Minutes '15 30 15 '$ -- --   METs 2 1.7 2.3 -- --     Biostep-RELP   Level -- -- -- 2 --   Minutes -- -- -- 15 --   METs -- -- -- 2 --     Track   Laps 42 60 35 30 36   Minutes '15 15 15 15 15   '$ METs 3.28 4.26 2.9 2.63 2.96     Home Exercise Plan   Plans to continue exercise at Home (comment)  walking, staff videos Home (comment)  walking, staff videos Home (comment)  walking, staff videos Home (comment)  walking, staff videos Home (comment)  walking, staff videos   Frequency Add 3 additional days to program exercise sessions. Add 3 additional days to program  exercise sessions. Add 3 additional days to program exercise sessions. Add 3 additional days to program exercise sessions. Add 3 additional days to program exercise sessions.   Initial Home Exercises Provided 02/07/22 02/07/22 02/07/22 02/07/22 02/07/22     Oxygen   Maintain Oxygen Saturation 88% or higher 88% or higher 88% or higher 88% or higher 88% or higher            Exercise Comments:   Exercise Comments     Row Name 12/27/21 1040 05/02/22 1039         Exercise Comments First full day of exercise!  Patient was oriented to gym and equipment including functions, settings, policies, and procedures.  Patient's individual exercise prescription and treatment plan were reviewed.  All starting workloads were established based on the results of the 6 minute walk test done at initial orientation visit.  The plan for exercise progression was also introduced and progression will be customized based on patient's performance and goals. Azzie graduated today from  rehab with 36 sessions completed.  Details of the patient's exercise prescription and what She needs to do in order to continue the prescription and progress were discussed with patient.  Patient was given a copy of prescription and goals.  Patient verbalized understanding.  Salaya plans to continue to exercise by Walking at home.               Exercise Goals and Review:   Exercise Goals     Row Name 12/15/21 1501             Exercise Goals   Increase Physical Activity Yes       Intervention Provide advice, education, support and counseling about physical activity/exercise needs.;Develop an individualized exercise prescription for aerobic and resistive training based on initial evaluation findings, risk stratification, comorbidities and participant's personal goals.       Expected Outcomes Short Term: Attend rehab on a regular basis to increase amount of physical activity.;Long Term: Add in home exercise to make exercise part  of routine and to increase amount of physical activity.;Long Term: Exercising regularly at least 3-5 days a week.       Increase Strength and Stamina Yes       Intervention Provide advice, education, support and counseling about physical activity/exercise needs.;Develop an individualized exercise prescription for aerobic and resistive training based on initial evaluation findings, risk stratification, comorbidities and participant's personal goals.       Expected Outcomes Short Term: Increase workloads from initial exercise prescription for resistance, speed, and METs.;Long Term: Improve cardiorespiratory fitness, muscular endurance and strength as measured by increased METs and functional capacity (6MWT);Short  Term: Perform resistance training exercises routinely during rehab and add in resistance training at home       Able to understand and use rate of perceived exertion (RPE) scale Yes       Intervention Provide education and explanation on how to use RPE scale       Expected Outcomes Short Term: Able to use RPE daily in rehab to express subjective intensity level;Long Term:  Able to use RPE to guide intensity level when exercising independently       Able to understand and use Dyspnea scale Yes       Intervention Provide education and explanation on how to use Dyspnea scale       Expected Outcomes Long Term: Able to use Dyspnea scale to guide intensity level when exercising independently;Short Term: Able to use Dyspnea scale daily in rehab to express subjective sense of shortness of breath during exertion       Knowledge and understanding of Target Heart Rate Range (THRR) Yes       Intervention Provide education and explanation of THRR including how the numbers were predicted and where they are located for reference       Expected Outcomes Short Term: Able to state/look up THRR;Long Term: Able to use THRR to govern intensity when exercising independently;Short Term: Able to use daily as guideline  for intensity in rehab       Able to check pulse independently Yes       Intervention Provide education and demonstration on how to check pulse in carotid and radial arteries.;Review the importance of being able to check your own pulse for safety during independent exercise       Expected Outcomes Short Term: Able to explain why pulse checking is important during independent exercise;Long Term: Able to check pulse independently and accurately       Understanding of Exercise Prescription Yes       Intervention Provide education, explanation, and written materials on patient's individual exercise prescription       Expected Outcomes Short Term: Able to explain program exercise prescription;Long Term: Able to explain home exercise prescription to exercise independently                Exercise Goals Re-Evaluation :  Exercise Goals Re-Evaluation     Row Name 12/27/21 1040 01/05/22 0959 01/16/22 1133 01/30/22 1356 02/02/22 1048     Exercise Goal Re-Evaluation   Exercise Goals Review Able to understand and use rate of perceived exertion (RPE) scale;Knowledge and understanding of Target Heart Rate Range (THRR);Able to understand and use Dyspnea scale;Able to check pulse independently;Understanding of Exercise Prescription Able to understand and use rate of perceived exertion (RPE) scale;Knowledge and understanding of Target Heart Rate Range (THRR);Able to understand and use Dyspnea scale;Able to check pulse independently;Understanding of Exercise Prescription Increase Physical Activity;Increase Strength and Stamina;Understanding of Exercise Prescription Increase Physical Activity;Increase Strength and Stamina;Understanding of Exercise Prescription Increase Physical Activity;Increase Strength and Stamina;Understanding of Exercise Prescription   Comments Reviewed RPE scale, THR and program prescription with pt today.  Pt voiced understanding and was given a copy of goals to take home. Aeryal reports doing  well in rehab, but reports not working out at home right now becuase she is moving next Monday so she has been active. She has not met with EP yet to go over home exercise. Emmalene has had a good start to rehab the last couple of weeks. She was able to complete 19 laps on the track,  she did try the treadmill which was more of a challenge for her and was not able to pace as fast as she was able to walk at a 0.6 mph speed. If she wants to walk on the treadmill, we hope to see that improve over time. The other machines she worked on she worked at her initial exercise prescription well. Will continue to monitor. Trulie is doing well in rehab. She recently improved her overall average MET level to 2.53 METs. She also increased her number of laps walked on the track to 30 laps. She improved to level 3 on the T4 as well. We will continue to monitor her progress in the program. Hoor is doing well in rehab.  She is not doing her home exercise yet.  She was waiting til she moved, but now that she has moved she is still not going.  We talked about getting up from TV and go for walk . She does feel liek her strength and stamina are improving.   Expected Outcomes Short: Use RPE daily to regulate intensity. Long: Follow program prescription in THR. Short: Meet with EP to go over home exercise Long: Follow program prescription in THR. Short: Continue to increase speed on treadmill Long: Continue to build up overall strength and stamina Short: Continue to push for more laps on the track. Long: Continue to increase strength and stamina. Short: Start to walk more in rehab Long: Continue to improve stamina    Row Name 02/07/22 0950 02/15/22 1325 02/23/22 0958 02/27/22 1422 03/13/22 1209     Exercise Goal Re-Evaluation   Exercise Goals Review Increase Physical Activity;Increase Strength and Stamina;Understanding of Exercise Prescription;Able to understand and use rate of perceived exertion (RPE) scale;Knowledge and understanding of  Target Heart Rate Range (THRR);Able to understand and use Dyspnea scale;Able to check pulse independently Increase Physical Activity;Increase Strength and Stamina;Understanding of Exercise Prescription Increase Physical Activity;Increase Strength and Stamina;Understanding of Exercise Prescription Increase Physical Activity;Increase Strength and Stamina;Understanding of Exercise Prescription Increase Physical Activity;Increase Strength and Stamina;Understanding of Exercise Prescription   Comments Reviewed home exercise with pt today.  Pt plans to walk and use staff videos at home for exercise.  Reviewed THR, pulse, RPE, sign and symptoms, pulse oximetery and when to call 911 or MD.  Also discussed weather considerations and indoor options.  Pt voiced understanding.  We also talked about getting a pulse oximeter to monitor at home. Ariahna is doing well in rehab.  She is up to 30 laps now!!  We will encourage her to try more weights too.  We will continue to montior her progress. Donovan is doing well in rehab.  She went for a walk for the first time yesterday since it was a little warmer than it has been.  She has not noticed much change in her stamina but she thinks that it more related to her age versus breathing.  She is doing more and actually feels like going for walks on her off days. Lomie is doing well in rehab. She increased her overall average MET level to 2.86 METs. She also was able to improve to level 3 on the T5, and she walked up to 42 laps on the track. We will continue to monitor her progress in the program. Dazariah is continuing to do well in rehab. She did walk a whole 60 laps on the track which is the most she has done so far! She increased to level 4 on the T4 Nustep and up to level  3 on the recumbent bike. We hope to see her increase overall watts. All O2 saturations are staying above 88%. Will continue to monitor.   Expected Outcomes Short: Start to add in walking at home and get pulse oximeter Long:  Continue to improve stamina Short; Try 4 lb weights Long; Continue to improve stamina Short: Continue to walk more on her off days.  Long: Conitnue to improve stamina. Short: Consistently walk above 30 laps on the track. Long: Continue to improve strength and stamina. Short: Continue to increase watts on recumbent bike Long: Continue to increase overall stamina and MET level    Row Name 03/27/22 1414 03/30/22 1003 04/10/22 1155 04/20/22 1034 04/24/22 1421     Exercise Goal Re-Evaluation   Exercise Goals Review Increase Physical Activity;Increase Strength and Stamina;Understanding of Exercise Prescription Increase Physical Activity;Increase Strength and Stamina;Understanding of Exercise Prescription Increase Physical Activity;Increase Strength and Stamina;Understanding of Exercise Prescription Increase Physical Activity;Increase Strength and Stamina;Understanding of Exercise Prescription Increase Physical Activity;Increase Strength and Stamina;Understanding of Exercise Prescription   Comments Ashana continues to do well in rehab. Her average overall MET level has stayed above 2.5 METs. She also began using the Recumbent elliptical at level 1 and the arm crank at level 2 which she did well with. She did walk less laps on the track as the most she walked since the last evaluation is 35 laps. We will continue to monitor her progress in the program. Chidera is doing well in rehab.  Suttyn admits to getting lazy and not walking like she was.  However, she is now using cart at store versus motorized cart!  She will get back to walking as it warms back up again. Avila is doing well in rehab. She did increase to level 3 on the arm ergometer. She tried out the treadmill again for the 2nd time and was not able to get her speed up enough to her normal walking pace, and could improve on increasing her walking speed on it. She is due for her post 6MWT and we hope to see improvement. Katlin has done well in rehab.  She improved her  post 6MWT by over 261f!!  She is planning to continue to walk on her own.  She does feel better since doing rehab and has more stamina.  She will also be starting PT for her back to maintain strength. MJessiis doing well in rehab and is close to graduating. She recently completed her post 6MWT and improved by 26.9%! She also walked back up to 36 laps on the track and has continued to work at level 3 on the arm crank and T4 nustep. We will continue to monitor his progress until she graduates from the program.   Expected Outcomes Short: Continue to push for more laps on the track. Long: Continue to improve strength and stamina. Short: Get back to walking again Long: Continue to improve stamina Short: Improve on post 6MWT Long: Continue to increase overall MET level and stamina Continue to exercise indpendently Short: Graduate. Long: Continue to exercise indpendently            Discharge Exercise Prescription (Final Exercise Prescription Changes):  Exercise Prescription Changes - 04/24/22 1400       Response to Exercise   Blood Pressure (Admit) 126/74    Blood Pressure (Exit) 128/68    Heart Rate (Admit) 67 bpm    Heart Rate (Exercise) 112 bpm    Heart Rate (Exit) 82 bpm    Oxygen Saturation (  Admit) 95 %    Oxygen Saturation (Exercise) 91 %    Oxygen Saturation (Exit) 95 %    Rating of Perceived Exertion (Exercise) 15    Perceived Dyspnea (Exercise) 2    Symptoms SOB    Duration Continue with 30 min of aerobic exercise without signs/symptoms of physical distress.    Intensity THRR unchanged      Progression   Progression Continue to progress workloads to maintain intensity without signs/symptoms of physical distress.    Average METs 2.8      Resistance Training   Training Prescription Yes    Weight 4 lb    Reps 10-15      Interval Training   Interval Training No      NuStep   Level 3    Minutes 15    METs 3.5      Arm Ergometer   Level 3    Minutes 15    METs 2.3       Track   Laps 36    Minutes 15    METs 2.96      Home Exercise Plan   Plans to continue exercise at Home (comment)   walking, staff videos   Frequency Add 3 additional days to program exercise sessions.    Initial Home Exercises Provided 02/07/22      Oxygen   Maintain Oxygen Saturation 88% or higher             Nutrition:  Target Goals: Understanding of nutrition guidelines, daily intake of sodium '1500mg'$ , cholesterol '200mg'$ , calories 30% from fat and 7% or less from saturated fats, daily to have 5 or more servings of fruits and vegetables.  Education: All About Nutrition: -Group instruction provided by verbal, written material, interactive activities, discussions, models, and posters to present general guidelines for heart healthy nutrition including fat, fiber, MyPlate, the role of sodium in heart healthy nutrition, utilization of the nutrition label, and utilization of this knowledge for meal planning. Follow up email sent as well. Written material given at graduation. Flowsheet Row Pulmonary Rehab from 04/27/2022 in [facility] Cardiac [facility] Pul[facility]  Date 01/19/22  [nutrition 2]  Educator [facility]  Instruction Review Code 1- Verbalizes Understanding       Biometrics:  Pre Biometrics - 12/15/21 1501       Pre Biometrics   Height '5\' 4"'$  (1.626 m)    Weight 174 lb 4.8 oz (79.1 kg)    Waist Circumference 44.5 inches    Hip Circumference 42.5 inches    Waist to Hip Ratio 1.05 %    BMI (Calculated) 29.9    Single Leg Stand 11.9 seconds   R            Post Biometrics - 04/13/22 1024        Post  Biometrics   Height '5\' 4"'$  (1.626 m)    Weight 180 lb 9.6 oz (81.9 kg)    Waist Circumference 40.5 inches    Hip Circumference 41 inches    Waist to Hip Ratio 0.99 %    BMI (Calculated) 30.98    Single Leg Stand 8.3 seconds             Nutrition Therapy Plan and Nutrition Goals:  Nutrition Therapy & Goals - 12/15/21 1158       Nutrition Therapy   Diet Heart healthy,  low Na, T2DM MNT    Drug/Food Interactions Statins/Certain Fruits    Protein (specify units) 90-100g    Fiber  25 grams    Whole Grain Foods 3 servings    Saturated Fats 14 max. grams    Fruits and Vegetables 8 servings/day    Sodium 2 grams      Personal Nutrition Goals   Nutrition Goal ST: read over materials, practice MyPlate guidelines, include balanced snacks during the day LT: Achieve and maintain A1C <7, eat consistent meals/snacks during the day (about 1-2 servings of CHO per snack and 3-5 servings of CHO per meals), include protein, fiber, and healthy fat at most meals/snacks    Comments 62 y.o. F admitted to pulmonary rehab for sarcoidosis. PMHx includes T2DM, GERD, DDD, HLD, osteoarthritis, former smoker. Relevant medications includes lipitor, vit D3, meclizine, metformin, pantoprazole, tizanidine. Last A1C 09/28/21 was 8.3. Kahdijah had previously seen some regarding diabetes in nutrition this past summer, but has not been back. Discussed general heart healthy eating and T2DM MNT include pairing foods, quality and quantity of CHOs, eating consistently, hydration, and exercise. Jaquitta feels that she does not eat very consistently right now - discussed some snacks that she could include like an apple with peanut butter.      Intervention Plan   Intervention Prescribe, educate and counsel regarding individualized specific dietary modifications aiming towards targeted core components such as weight, hypertension, lipid management, diabetes, heart failure and other comorbidities.;Nutrition handout(s) given to patient.    Expected Outcomes Short Term Goal: Understand basic principles of dietary content, such as calories, fat, sodium, cholesterol and nutrients.;Short Term Goal: A plan has been developed with personal nutrition goals set during dietitian appointment.;Long Term Goal: Adherence to prescribed nutrition plan.             Nutrition Assessments:  MEDIFICTS Score Key: ?70 Need to make  dietary changes  40-70 Heart Healthy Diet ? 40 Therapeutic Level Cholesterol Diet  Flowsheet Row Pulmonary Rehab from 04/18/2022 in [facility] Cardiac and Pulmonary Rehab  Picture Your Plate Total Score on Discharge 56      Picture Your Plate Scores: [id number] Unhealthy dietary pattern with much room for improvement. 41-50 Dietary pattern unlikely to meet recommendations for good health and room for improvement. 51-60 More healthful dietary pattern, with some room for improvement.  >60 Healthy dietary pattern, although there may be some specific behaviors that could be improved.   Nutrition Goals Re-Evaluation:  Nutrition Goals Re-Evaluation     Row Name 01/05/22 1009 02/02/22 1051 02/23/22 1002 03/30/22 0954 04/20/22 1039     Goals   Nutrition Goal ST: read over materials, practice MyPlate guidelines, include balanced snacks during the day LT: Achieve and maintain A1C <7, eat consistent meals/snacks during the day (about 1-2 servings of CHO per snack and 3-5 servings of CHO per meals), include protein, fiber, and healthy fat at most meals/snacks ST: read over materials, practice MyPlate guidelines, include balanced snacks during the day LT: Achieve and maintain A1C <7, eat consistent meals/snacks during the day (about 1-2 servings of CHO per snack and 3-5 servings of CHO per meals), include protein, fiber, and healthy fat at most meals/snacks Short: Add back in more fruit that is unsweetened Long: Continue to work on improving diet Short; Try canned fruit in its own juice Long: Conitnue to add in more protein Short: Find canned fruit Long: Continue to avoid simple carbs   Comment Joal reports doing well with her nutrition; she reports no changes since the last time we spoke. Encouraged to focus on the short term goals we addressed when we first spoke. Reviewed general heart  healthy eating. Breiana is doing well in rehab.  She has been watching her sodium and sugar closely.  She is now using Splenda versus  sugar.  She has not been eating fruits and vegetables as she does not like to put her teeth in.  We talked about eating more canned stuff to help but to rinse them off.  She has canned fruit but been avoiding it.  We talked about getting it in fruit juice versus syrup to help with sugar.  We also talked about making sure she balances out with protein. She is also eating frank and beans a lot and we talked about how much sugar is in that. Vernese is doing well.  She is wearing her teeth now.  She says they are feeling better.  She is still not eating with them in as she cannot feel them as well when eating.  She says they feel really hard when she chews especially on the bottom.  She has continued to work on increasing her fruit.  She is now shopping for the better canned fruit but had a hard time finding it.  We talked about trying to find canned fruit in its own juice. Jaeli is doing well in rehab.  She is working on her diet.  She tries to avoid fried foods.  She is has not looked for the canned fruit/applesauce and no added sugar.  She came in eating a [employer] biscuits today as she was running late and didn't have time to make her sausage and onion this morning.  She is getting in a lot of vegetables.   We talked about tring to get brown rice or wild rice. Taneisha is still working on her diet.  She is still sticking to soft foods as her dentures are not the most comfortable.  We talked about using her teeth to eat a good diet and lots of variety.  She is trying to get better about what she is eating.   Expected Outcome ST: read over materials, practice MyPlate guidelines, include balanced snacks during the day LT: Achieve and maintain A1C <7, eat consistent meals/snacks during the day (about 1-2 servings of CHO per snack and 3-5 servings of CHO per meals), include protein, fiber, and healthy fat at most meals/snacks Short: Add back in more fruit that is unsweetened Long: Continue to work on improving diet Short; Try  canned fruit in its own juice Long: Conitnue to add in more protein Short: Find canned fruit Long: Continue to avoid simple carbs Continue to work on healthy diet with variety            Nutrition Goals Discharge (Final Nutrition Goals Re-Evaluation):  Nutrition Goals Re-Evaluation - 04/20/22 1039       Goals   Nutrition Goal Short: Find canned fruit Long: Continue to avoid simple carbs    Comment Inamae is still working on her diet.  She is still sticking to soft foods as her dentures are not the most comfortable.  We talked about using her teeth to eat a good diet and lots of variety.  She is trying to get better about what she is eating.    Expected Outcome Continue to work on healthy diet with variety             Psychosocial: Target Goals: Acknowledge presence or absence of significant depression and/or stress, maximize coping skills, provide positive support system. Participant is able to verbalize types and ability to use techniques and skills needed  for reducing stress and depression.   Education: Stress, Anxiety, and Depression - Group verbal and visual presentation to define topics covered.  Reviews how body is impacted by stress, anxiety, and depression.  Also discusses healthy ways to reduce stress and to treat/manage anxiety and depression.  Written material given at graduation. Flowsheet Row Pulmonary Rehab from 04/27/2022 in [facility] Cardiac and Pulmonary Rehab  Date 02/23/22  Educator [facility] [place]  Instruction Review Code 1- [employer] Understanding       Education: Sleep Hygiene -Provides group verbal and written instruction about how sleep can affect your health.  Define sleep hygiene, discuss sleep cycles and impact of sleep habits. Review good sleep hygiene tips.    Initial Review & Psychosocial Screening:  Initial Psych Review & Screening - 12/12/21 1119       Initial Review   Current issues with None Identified      Family Dynamics   Good Support System? Yes     Comments Jinnie can look to her sister inlaw and sons for support. She has church family and pastor for support as well. The normal stresses of daily life are her only stressors.      Barriers   Psychosocial barriers to participate in program The patient should benefit from training in stress management and relaxation.      Screening Interventions   Interventions Encouraged to exercise;To provide support and resources with identified psychosocial needs;Provide feedback about the scores to participant    Expected Outcomes Short Term goal: Utilizing psychosocial counselor, staff and physician to assist with identification of specific Stressors or current issues interfering with healing process. Setting desired goal for each stressor or current issue identified.;Long Term Goal: Stressors or current issues are controlled or eliminated.;Short Term goal: Identification and review with participant of any Quality of Life or Depression concerns found by scoring the questionnaire.;Long Term goal: The participant improves quality of Life and PHQ9 Scores as seen by post scores and/or verbalization of changes             Quality of Life Scores:  Scores of 19 and below usually indicate a poorer quality of life in these areas.  A difference of  2-3 points is a clinically meaningful difference.  A difference of 2-3 points in the total score of the Quality of Life Index has been associated with significant improvement in overall quality of life, self-image, physical symptoms, and general health in studies assessing change in quality of life.  PHQ-9: Review Flowsheet  More data exists      04/18/2022 03/27/2022 03/01/2022 01/27/2022 01/26/2022  Depression screen PHQ 2/9  Decreased Interest 1 0 0 0 0  Down, Depressed, Hopeless 0 0 0 0 0  PHQ - 2 Score 1 0 0 0 0  Altered sleeping 0 0 0 0 0  Tired, decreased energy 3 0 0 0 0  Change in appetite 3 0 0 0 0  Feeling bad or failure about yourself  0 0 0 0 0  Trouble  concentrating 0 0 0 0 0  Moving slowly or fidgety/restless 1 0 0 0 0  Suicidal thoughts 0 0 0 0 0  PHQ-9 Score 8 0 0 0 0  Difficult doing work/chores Somewhat difficult Not difficult at all Not difficult at all Not difficult at all Not difficult at all   Interpretation of Total Score  Total Score Depression Severity:  1-4 = Minimal depression, 5-9 = Mild depression, 10-14 = Moderate depression, 15-19 = Moderately severe depression, 20-27 =  Severe depression   Psychosocial Evaluation and Intervention:  Psychosocial Evaluation - 12/12/21 1122       Psychosocial Evaluation & Interventions   Interventions Encouraged to exercise with the program and follow exercise prescription;Relaxation education;Stress management education    Comments Caragan can look to her sister inlaw and sons for support. She has church family and pastor for support as well. The normal stresses of daily life are her only stressors.    Expected Outcomes Short: Start [facility] to help with mood. Long: Maintain a healthy mental state.    Continue Psychosocial Services  Follow up required by staff             Psychosocial Re-Evaluation:  Psychosocial Re-Evaluation     Row Name 01/05/22 1001 01/19/22 0955 02/02/22 1049 02/23/22 1000 03/30/22 1000     Psychosocial Re-Evaluation   Current issues with Current Stress Concerns Current Stress Concerns Current Stress Concerns Current Stress Concerns Current Stress Concerns   Comments Kiarna reports moving next Monday, she reports no other stressors at this time. She reports that she will sit down and take a minute to breathe to help with stress. Affie reports sleeping well. Toshua moved last week and is excited for her new house!!  Reviewed patient health questionnaire (PHQ-9) with patient for follow up. Previously, patients score indicated signs/symptoms of depression.  Reviewed to see if patient is improving symptom wise while in program.  Score improved and patient states that it  is because they have moved into the house she has always wanted and feeling better overall. Athelene is doing well mentally.  She is in her new house and liking it!  She no longer has any major stressors going on.  She sleeps well overall.   She can tell rehab is helping her feel better overall. Moneeka is doing well in rehab. She feels good mentally.  She got her new teeth dentures yesterday and she is getting used to them.  She is still doing well in her new house.  She enjoyed celebrating the holidays and birthday parties in it!  She is still sleeping well. Zania is doing well in rehab.  She is trying to stay stress free.  Finances seem to continue to be her biggest stressor.  She tries not to let too much get to her.  She continues to enjoy the her new house.  She is sleeping well.   Expected Outcomes ST: continue to exercise for mental health boost LT: maintain posisitve attitude Short: Continue to attend [facility] regularly for regular exercise and social engagement. Long: Continue to improve symptoms and manage a positive mental state. Short: Continue to attend [facility] regularly for regular exercise and social engagement. Long: Continue to improve symptoms and manage a positive mental state. Short: Continue to exercise on off days for mental boost Long: Continue to stay positive Short; Continue to work on finances Long: Conitnue to focus on positive   Interventions -- Encouraged to attend Pulmonary Rehabilitation for the exercise;Stress management education Encouraged to attend Pulmonary Rehabilitation for the exercise Encouraged to attend Pulmonary Rehabilitation for the exercise Encouraged to attend Pulmonary Rehabilitation for the exercise   Continue Psychosocial Services  Follow up required by staff Follow up required by staff -- Follow up required by staff Follow up required by staff    [place] Name 04/20/22 1036             Psychosocial Re-Evaluation   Current issues with Current Stress Concerns  Comments Xochitl has done well in rehab.  She tries to stay away from stress.  Finances continue to be the biggest issue, but makes the best of it.  She did just find out that she has scoliosis which is why her back has been hurting and will now get PT for it.  She enjoyed rehab and getting into routine of exercise. She also learned a lot in education classes.       Expected Outcomes Continue to exercise for mental boost and stay positive                Psychosocial Discharge (Final Psychosocial Re-Evaluation):  Psychosocial Re-Evaluation - 04/20/22 1036       Psychosocial Re-Evaluation   Current issues with Current Stress Concerns    Comments Larina has done well in rehab.  She tries to stay away from stress.  Finances continue to be the biggest issue, but makes the best of it.  She did just find out that she has scoliosis which is why her back has been hurting and will now get PT for it.  She enjoyed rehab and getting into routine of exercise. She also learned a lot in education classes.    Expected Outcomes Continue to exercise for mental boost and stay positive             Education: Education Goals: Education classes will be provided on a weekly basis, covering required topics. Participant will state understanding/return demonstration of topics presented.  Learning Barriers/Preferences:  Learning Barriers/Preferences - 12/12/21 1118       Learning Barriers/Preferences   Learning Barriers None    Learning Preferences None             General Pulmonary Education Topics:  Infection Prevention: - Provides verbal and written material to individual with discussion of infection control including proper hand washing and proper equipment cleaning during exercise session. Flowsheet Row Pulmonary Rehab from 04/27/2022 in [facility] Cardiac and Pulmonary Rehab  Date 12/12/21  Educator [facility]  Instruction Review Code 1- Verbalizes Understanding       Falls Prevention: - Provides  verbal and written material to individual with discussion of falls prevention and safety. Flowsheet Row Pulmonary Rehab from 04/27/2022 in [facility] Cardiac and Pulmonary Rehab  Date 12/12/21  Educator [facility]  Instruction Review Code 1- Verbalizes Understanding       Chronic Lung Disease Review: - Group verbal instruction with posters, models, PowerPoint presentations and videos,  to review new updates, new respiratory medications, new advancements in procedures and treatments. Providing information on websites and "800" numbers for continued self-education. Includes information about supplement oxygen, available portable oxygen systems, continuous and intermittent flow rates, oxygen safety, concentrators, and Medicare reimbursement for oxygen. Explanation of Pulmonary Drugs, including class, frequency, complications, importance of spacers, rinsing mouth after steroid MDI's, and proper cleaning methods for nebulizers. Review of basic lung anatomy and physiology related to function, structure, and complications of lung disease. Review of risk factors. Discussion about methods for diagnosing sleep apnea and types of masks and machines for OSA. Includes a review of the use of types of environmental controls: home humidity, furnaces, filters, dust mite/pet prevention, HEPA vacuums. Discussion about weather changes, air quality and the benefits of nasal washing. Instruction on Warning signs, infection symptoms, calling MD promptly, preventive modes, and value of vaccinations. Review of effective airway clearance, coughing and/or vibration techniques. Emphasizing that all should Create an Action Plan. Written material given at graduation. Flowsheet Row Pulmonary Rehab from 04/27/2022 in  [facility] and [facility]monary Re[facility]  Education need identified 12/15/21  Date 04/27/22  Educator [facility]  Instruction Review Code 1- Verbalizes Understanding       AED/CPR: - Group verbal and written instruction with the use of models to  demonstrate the basic use of the AED with the basic ABC's of resuscitation.    Anatomy and Cardiac Procedures: - Group verbal and visual presentation and models provide information about basic cardiac anatomy and function. Reviews the testing methods done to diagnose heart disease and the outcomes of the test results. Describes the treatment choices: Medical Management, Angioplasty, or Coronary Bypass Surgery for treating various heart conditions including Myocardial Infarction, Angina, Valve Disease, and Cardiac Arrhythmias.  Written material given at graduation. Flowsheet Row Pulmonary Rehab from 04/27/2022 in [facility] Cardiac and Pulmonary Rehab  Date 01/26/22  Educator [facility]  Instruction Review Code 1- Verbalizes Understanding       Medication Safety: - Group verbal and visual instruction to review commonly prescribed medications for heart and lung disease. Reviews the medication, class of the drug, and side effects. Includes the steps to properly store meds and maintain the prescription regimen.  Written material given at graduation. Flowsheet Row Pulmonary Rehab from 04/27/2022 in [facility] Cardiac and Pulmonary Rehab  Date 02/02/22  Educator [facility]  Instruction Review Code 1- Verbalizes Understanding       Other: -Provides group and verbal instruction on various topics (see comments) Flowsheet Row Pulmonary Rehab from 04/27/2022 in [facility] Cardiac and Pulmonary Rehab  Date 04/06/22  Educator [facility]  Instruction Review Code 1- Verbalizes Understanding       Knowledge Questionnaire Score:  Knowledge Questionnaire Score - 04/18/22 1004       Knowledge Questionnaire Score   Post Score 15/18              Core Components/Risk Factors/Patient Goals at Admission:  Personal Goals and Risk Factors at Admission - 12/15/21 1427       Core Components/Risk Factors/Patient Goals on Admission    Weight Management Yes;Weight Loss    Intervention Weight Management: Develop a combined nutrition and  exercise program designed to reach desired caloric intake, while maintaining appropriate intake of nutrient and fiber, sodium and fats, and appropriate energy expenditure required for the weight goal.;Weight Management: Provide education and appropriate resources to help participant work on and attain dietary goals.;Weight Management/Obesity: Establish reasonable short term and long term weight goals.    Admit Weight 174 lb 4.8 oz (79.1 kg)    Goal Weight: Short Term 170 lb (77.1 kg)    Goal Weight: Long Term 164 lb (74.4 kg)    Expected Outcomes Short Term: Continue to assess and modify interventions until short term weight is achieved;Long Term: Adherence to nutrition and physical activity/exercise program aimed toward attainment of established weight goal;Weight Loss: Understanding of general recommendations for a balanced deficit meal plan, which promotes 1-2 lb weight loss per week and includes a negative energy balance of [phone] kcal/d;Understanding recommendations for meals to include 15-35% energy as protein, 25-35% energy from fat, 35-60% energy from carbohydrates, less than '200mg'$  of dietary cholesterol, 20-35 gm of total fiber daily;Understanding of distribution of calorie intake throughout the day with the consumption of 4-5 meals/snacks    Improve shortness of breath with ADL's Yes    Intervention Provide education, individualized exercise plan and daily activity instruction to help decrease symptoms of SOB with activities of daily living.    Expected Outcomes Short Term: Improve cardiorespiratory fitness to achieve a reduction  of symptoms when performing ADLs;Long Term: Be able to perform more ADLs without symptoms or delay the onset of symptoms    Diabetes Yes    Intervention Provide education about signs/symptoms and action to take for hypo/hyperglycemia.;Provide education about proper nutrition, including hydration, and aerobic/resistive exercise prescription along with prescribed  medications to achieve blood glucose in normal ranges: Fasting glucose 65-99 mg/dL    Expected Outcomes Short Term: Participant verbalizes understanding of the signs/symptoms and immediate care of hyper/hypoglycemia, proper foot care and importance of medication, aerobic/resistive exercise and nutrition plan for blood glucose control.;Long Term: Attainment of HbA1C < 7%.             Education:Diabetes - Individual verbal and written instruction to review signs/symptoms of diabetes, desired ranges of glucose level fasting, after meals and with exercise. Acknowledge that pre and post exercise glucose checks will be done for 3 sessions at entry of program. Flowsheet Row Pulmonary Rehab from 04/27/2022 in [facility] Cardiac and Pulmonary Rehab  Date 12/12/21  Educator [facility]  Instruction Review Code 1- Verbalizes Understanding       Know Your Numbers and Heart Failure: - Group verbal and visual instruction to discuss disease risk factors for cardiac and pulmonary disease and treatment options.  Reviews associated critical values for Overweight/Obesity, Hypertension, Cholesterol, and Diabetes.  Discusses basics of heart failure: signs/symptoms and treatments.  Introduces Heart Failure Zone chart for action plan for heart failure.  Written material given at graduation. Flowsheet Row Pulmonary Rehab from 04/27/2022 in [facility] Cardiac and Pulmonary Rehab  Date 04/20/22  Educator [facility]  Instruction Review Code 1- Verbalizes Understanding       Core Components/Risk Factors/Patient Goals Review:   Goals and Risk Factor Review     Row Name 01/05/22 1004 02/02/22 1054 02/23/22 1006 03/30/22 0958 04/20/22 1040     Core Components/Risk Factors/Patient Goals Review   Personal Goals Review Diabetes;Improve shortness of breath with ADL's;Weight Management/Obesity Diabetes;Improve shortness of breath with ADL's;Weight Management/Obesity;Increase knowledge of respiratory medications and ability to use respiratory devices  properly. Diabetes;Improve shortness of breath with ADL's;Weight Management/Obesity;Increase knowledge of respiratory medications and ability to use respiratory devices properly.;Hypertension Diabetes;Improve shortness of breath with ADL's;Weight Management/Obesity;Increase knowledge of respiratory medications and ability to use respiratory devices properly.;Hypertension Diabetes;Improve shortness of breath with ADL's;Weight Management/Obesity;Increase knowledge of respiratory medications and ability to use respiratory devices properly.;Hypertension   Review Cally recently started rehab and reports doing well. She reports her shortness of breath will get worse by "doing to much" - she feels this more right now as she is moving next Monday. She does not check her O2 at home - encouraged to use a pulse oximeter. Dedrea reports that she uses PLB and feels this helps. She reports her BG has been running around 100 at home in the am. She reports not taking her metformin this moring, but usually doing well with taking her medications as prescribed with no issues. Her weight has been stable since starting rehab. Itxel is doing well in rehab.  Her weigth is staying steady.  Her pressures have been good and sugars are doign much better.  She has been running in the 140-150s.  She had one day at 191 and she was very surprised to see but it all evened back out.  She is doing well on her meds but not consistent about using them the way she should. Rylee is doing well in rehab.  Her weight continues to stay steady.  She is able to do more  now with less SOB.  She is doing well on her meds.  Her pressures are doing well and her sugars too.  She checks her sugars at night before dinner.  She will also check it if she feels off. Tylynn is doing well in rehab.  Her weight is up some.  She had a doctor's appt the other day and he A1c was a 7.  She wants to work on getting it lower, so we talked about balancing her diet more.  Her pressures  are doing well.  Her breathing is doing better overall and she just takes her inhalers. Jamei has done well in rehab. Her weight has been holding steady.  Her pressures and sugars are doing well.  Her breathing has gotten better throughout the program and she is able to do more now than when she started.  She notes that she does not fatigue as quickly as she used to.  She continues to use her inhaler as needed.   Expected Outcomes ST: consider getting a pulse oximeter to check O2 at home LT: manage risk factors Short: Continue to keep eye on her sugar Long: Continue to monitor risk factors Short: conitnue to montior sugar levels Long: continue to work on weight loss Short: Conitnue to keep eye on sugars Long: Continue to work on weight loss. Continue to montior risk factors            Core Components/Risk Factors/Patient Goals at Discharge (Final Review):   Goals and Risk Factor Review - 04/20/22 1040       Core Components/Risk Factors/Patient Goals Review   Personal Goals Review Diabetes;Improve shortness of breath with ADL's;Weight Management/Obesity;Increase knowledge of respiratory medications and ability to use respiratory devices properly.;Hypertension    Review Manon has done well in rehab. Her weight has been holding steady.  Her pressures and sugars are doing well.  Her breathing has gotten better throughout the program and she is able to do more now than when she started.  She notes that she does not fatigue as quickly as she used to.  She continues to use her inhaler as needed.    Expected Outcomes Continue to montior risk factors             ITP Comments:  ITP Comments     Row Name 12/12/21 1116 12/15/21 1421 12/21/21 0852 12/27/21 1039 01/18/22 0734   ITP Comments Virtual Visit completed. Patient informed on EP and RD appointment and 6 Minute walk test. Patient also informed of patient health questionnaires on My Chart. Patient Verbalizes understanding. Visit diagnosis can be  found in [facility]/09/[facility]21. Completed 6MWT and gym orientation. Initial ITP created and sent for review to Dr Ottie Glazier, Medical Director. 30 Day review completed. Medical Director ITP review done, changes made as directed, and signed approval by Medical Director.    NEW TO PROGRAM First full day of exercise!  Patient was oriented to gym and equipment including functions, settings, policies, and procedures.  Patient's individual exercise prescription and treatment plan were reviewed.  All starting workloads were established based on the results of the 6 minute walk test done at initial orientation visit.  The plan for exercise progression was also introduced and progression will be customized based on patient's performance and goals. 30 Day review completed. Medical Director ITP review done, changes made as directed, and signed approval by Medical Director.   New to program    Row Name 02/15/22 0945 03/15/22 0859 04/12/22 0758 05/02/22 1039  ITP Comments 30 Day review completed. Medical Director ITP review done, changes made as directed, and signed approval by Medical Director. 30 Day review completed. Medical Director ITP review done, changes made as directed, and signed approval by Medical Director. 30 day review completed. ITP sent to Dr. Zetta Bills, Medical Director of  Pulmonary Rehab. Continue with ITP unless changes are made by physician. Shailey graduated today from  rehab with 36 sessions completed.  Details of the patient's exercise prescription and what She needs to do in order to continue the prescription and progress were discussed with patient.  Patient was given a copy of prescription and goals.  Patient verbalized understanding.  Farron plans to continue to exercise by Walking at home.             Comments: Discharge ITP

## 2022-06-01 ENCOUNTER — Ambulatory Visit: Payer: Self-pay | Admitting: *Deleted

## 2022-06-01 NOTE — Telephone Encounter (Signed)
Summary: diabetes concern   Patient called stated she woke up took her sugar and it was 284. She was concerned because it had not been that high in a long time. She has not eaten nor taken her medication. Please advise     Reason for Disposition  [1] Blood glucose 240 - 300 mg/dL (62.8 - 31.5 mmol/L) AND [2] does not  use insulin (e.g., not insulin-dependent; most people with type 2 diabetes)  Answer Assessment - Initial Assessment Questions 1. BLOOD GLUCOSE: "What is your blood glucose level?"      284- fasting- recheck- 119,137 2. ONSET: "When did you check the blood glucose?"     9:15 today, 9:30 today 3. USUAL RANGE: "What is your glucose level usually?" (e.g., usual fasting morning value, usual evening value)     100's- not 200 4. KETONES: "Do you check for ketones (urine or blood test strips)?" If Yes, ask: "What does the test show now?"      N/A 5. TYPE 1 or 2:  "Do you know what type of diabetes you have?"  (e.g., Type 1, Type 2, Gestational; doesn't know)      Type 2 6. INSULIN: "Do you take insulin?" "What type of insulin(s) do you use? What is the mode of delivery? (syringe, pen; injection or pump)?"      no 7. DIABETES PILLS: "Do you take any pills for your diabetes?" If Yes, ask: "Have you missed taking any pills recently?"     Yes- no missed medication 8. OTHER SYMPTOMS: "Do you have any symptoms?" (e.g., fever, frequent urination, difficulty breathing, dizziness, weakness, vomiting)     Some fatigue  Protocols used: Diabetes - High Blood Sugar-A-AH

## 2022-06-01 NOTE — Telephone Encounter (Signed)
  Chief Complaint: elevated glucose reading Symptoms: some fatigue Frequency: today only- recheck WNL Pertinent Negatives: Patient denies fever, frequent urination, difficulty breathing, dizziness, weakness, vomiting Disposition: [] ED /[] Urgent Care (no appt availability in office) / [] Appointment(In office/virtual)/ []  [facility]/ [x] Home Care/ [] Refused Recommended Disposition /[] [facility]/ []  Follow-up with PCP Additional Notes: Patient had elevated glucose reading this morning- recheck was within normal ranges- patient will continue to monitor levels and call back if this should happen again

## 2022-06-01 NOTE — Telephone Encounter (Signed)
FYI

## 2022-06-26 ENCOUNTER — Ambulatory Visit (INDEPENDENT_AMBULATORY_CARE_PROVIDER_SITE_OTHER): Payer: Medicaid Other | Admitting: Family Medicine

## 2022-06-26 ENCOUNTER — Encounter: Payer: Self-pay | Admitting: Family Medicine

## 2022-06-26 VITALS — BP 122/68 | HR 73 | Temp 98.0°F | Resp 16 | Ht 64.0 in | Wt 185.9 lb

## 2022-06-26 DIAGNOSIS — J984 Other disorders of lung: Secondary | ICD-10-CM

## 2022-06-26 DIAGNOSIS — R140 Abdominal distension (gaseous): Secondary | ICD-10-CM

## 2022-06-26 DIAGNOSIS — Z7984 Long term (current) use of oral hypoglycemic drugs: Secondary | ICD-10-CM | POA: Diagnosis not present

## 2022-06-26 DIAGNOSIS — J302 Other seasonal allergic rhinitis: Secondary | ICD-10-CM

## 2022-06-26 DIAGNOSIS — E119 Type 2 diabetes mellitus without complications: Secondary | ICD-10-CM

## 2022-06-26 DIAGNOSIS — K219 Gastro-esophageal reflux disease without esophagitis: Secondary | ICD-10-CM

## 2022-06-26 DIAGNOSIS — Z5181 Encounter for therapeutic drug level monitoring: Secondary | ICD-10-CM

## 2022-06-26 DIAGNOSIS — I340 Nonrheumatic mitral (valve) insufficiency: Secondary | ICD-10-CM | POA: Diagnosis not present

## 2022-06-26 DIAGNOSIS — I712 Thoracic aortic aneurysm, without rupture, unspecified: Secondary | ICD-10-CM

## 2022-06-26 DIAGNOSIS — I493 Ventricular premature depolarization: Secondary | ICD-10-CM | POA: Diagnosis not present

## 2022-06-26 DIAGNOSIS — E782 Mixed hyperlipidemia: Secondary | ICD-10-CM

## 2022-06-26 DIAGNOSIS — D860 Sarcoidosis of lung: Secondary | ICD-10-CM | POA: Diagnosis not present

## 2022-06-26 MED ORDER — EMPAGLIFLOZIN 10 MG PO TABS: 10.0000 mg | ORAL_TABLET | Freq: Every day | ORAL | 2 refills | Status: DC

## 2022-06-26 NOTE — Assessment & Plan Note (Addendum)
Lipids recently done and reviewed Continue statin Lab Results  Component Value Date   CHOL 151 06/23/2021   HDL 47 (L) 06/23/2021   LDLCALC 69 06/23/2021   TRIG 263 (H) 06/23/2021   CHOLHDL 3.2 06/23/2021

## 2022-06-26 NOTE — Assessment & Plan Note (Signed)
She just completed pulmonary rehab with [facility] Reports DOE sx and improving and she is planning to continue working on exercise tolerance on her own Using only symbicort inhaler

## 2022-06-26 NOTE — Assessment & Plan Note (Signed)
Per cardiology 

## 2022-06-26 NOTE — Progress Notes (Signed)
Name: Brenda Ellison   MRN: [id number]    DOB: December 08, 1960   Date:06/26/2022       Progress Note  Chief Complaint  Patient presents with   Follow-up   Diabetes   Hyperlipidemia   Gastroesophageal Reflux     Subjective:   Brenda Ellison is a 62 y.o. female, presents to clinic for routine f/up on chronic conditions  DM:   Pt managing DM with metformin XR (some GI upset) 500 mg BID with food - farxiga has previously been prescribed for her but not currently taking (could not afford) she wants to try jardiance and see if that's covered Reports good med compliance Pt has some GI upset from metformin but it got better, no diarrhea  Blood sugars she states she is checking but can't tell me recent readingsDenies: Polyuria, polydipsia, vision changes, neuropathy, hypoglycemia Recent pertinent labs: Lab Results  Component Value Date   HGBA1C 7.0 (A) 03/27/2022   HGBA1C 6.1 (A) 12/26/2021   HGBA1C 8.3 (H) 09/28/2021   Lab Results  Component Value Date   MICROALBUR 1.4 06/23/2021   LDLCALC 69 06/23/2021   CREATININE 0.98 03/01/2022   Standard of care and health maintenance: Urine Microalbumin:  due Foot exam:  done today  DM eye exam:  due ACEI/ARB:  not on Statin:  yes  Chronic lung disease and DOE/chronic cough managed by [facility] pulmonary specialists - hx of sarcoidosis and small airway disease per care everywhere on symbicort albuterol  Seasonal allergies on flonase, singulair xyzal  Chronic GERD and GI upset, also is concerned with abdominal bloating she denies abd pain right now, vomiting or diarrhea On ppi - pt uses it intermittently  Acute on chronic back pain with DDD on muscle relaxers  HLD Started tx due to T2DM, on atorvastatin 20 mg daily Lab Results  Component Value Date   CHOL 151 06/23/2021   HDL 47 (L) 06/23/2021   LDLCALC 69 06/23/2021   TRIG 263 (H) 06/23/2021   CHOLHDL 3.2 06/23/2021       Current Outpatient Medications:    albuterol (VENTOLIN HFA)  108 (90 Base) MCG/ACT inhaler, Inhale 2 puffs into the lungs every 4 (four) hours as needed for wheezing or shortness of breath., Disp: 18 g, Rfl: 3   atorvastatin (LIPITOR) 20 MG tablet, TAKE 1 TABLET BY MOUTH AT BEDTIME, Disp: 30 tablet, Rfl: 8   blood glucose meter kit and supplies, Dispense based on patient and insurance preference. Use up to four times daily as directed. (FOR ICD-10 E10.9, E11.9)., Disp: 1 each, Rfl: 0   budesonide-formoterol (SYMBICORT) 160-4.5 MCG/ACT inhaler, Inhale 2 puffs into the lungs 2 (two) times daily., Disp: 1 each, Rfl: 0   celecoxib (CELEBREX) 100 MG capsule, Take 1 capsule by mouth 2 (two) times daily., Disp: , Rfl:    cetirizine (ZYRTEC ALLERGY) 10 MG tablet, 1 tablet Orally Once a day for 30 day(s), Disp: , Rfl:    Cholecalciferol (VITAMIN D3) 2000 UNITS capsule, Take 2,000 Units by mouth daily., Disp: , Rfl:    empagliflozin (JARDIANCE) 10 MG TABS tablet, Take 1 tablet (10 mg total) by mouth daily before breakfast., Disp: 30 tablet, Rfl: 2   fluticasone (FLONASE) 50 MCG/ACT nasal spray, Place 2 sprays into both nostrils daily., Disp: 16 g, Rfl: 6   gabapentin (NEURONTIN) 100 MG capsule, Take 100 mg by mouth 2 (two) times daily., Disp: , Rfl:    levocetirizine (XYZAL) 5 MG tablet, Take 1 tablet (5 mg total) by  mouth every evening., Disp: 90 tablet, Rfl: 1   meclizine (ANTIVERT) 12.5 MG tablet, Take 1 tablet (12.5 mg total) by mouth 3 (three) times daily as needed for dizziness., Disp: 30 tablet, Rfl: 0   metFORMIN (GLUCOPHAGE-XR) 500 MG 24 hr tablet, Take 1 tablet (500 mg total) by mouth 2 (two) times daily with a meal., Disp: 90 tablet, Rfl: 1   montelukast (SINGULAIR) 10 MG tablet, Take 1 tablet (10 mg total) by mouth at bedtime., Disp: 30 tablet, Rfl: 3   pantoprazole (PROTONIX) 20 MG tablet, Take 1 tablet (20 mg total) by mouth 2 (two) times daily. For stomach protection with NSAIDs and GERD, Disp: 60 tablet, Rfl: 1   promethazine-dextromethorphan  (PROMETHAZINE-DM) 6.25-15 MG/5ML syrup, Take 2.5-5 mLs by mouth 3 (three) times daily as needed for cough., Disp: 118 mL, Rfl: 1   tiZANidine (ZANAFLEX) 4 MG tablet, Take 1 tablet (4 mg total) by mouth every 8 (eight) hours as needed for muscle spasms (muscle tightness/spasms)., Disp: 30 tablet, Rfl: 1   albuterol (VENTOLIN HFA) 108 (90 Base) MCG/ACT inhaler, , Disp: , Rfl:    benzonatate (TESSALON) 100 MG capsule, Take 1-2 capsules (100-200 mg total) by mouth 3 (three) times daily as needed for cough. (Patient not taking: Reported on 06/26/2022), Disp: 30 capsule, Rfl: 1  Patient Active Problem List   Diagnosis Date Noted   Type 2 diabetes mellitus without complication, without long-term current use of insulin (HCC) 06/30/2021   Sarcoidosis of lung (HCC) 06/06/2021   Pelvic pain 11/29/2020   Gastroesophageal reflux disease 03/18/2020   New onset type 2 diabetes mellitus (HCC) 02/19/2020   Allergic rhinitis 02/17/2020   Thoracic aortic aneurysm without rupture (HCC) 09/23/2019   Small airways disease 05/13/2019   Class 1 obesity with serious comorbidity and body mass index (BMI) of 31.0 to 31.9 in adult 05/13/2019   Family history of early CAD 05/13/2019   Lung nodule 04/01/2019   Wheeze 04/01/2019   Exertional shortness of breath 04/01/2019   Mediastinal lymphadenopathy 04/01/2019   Hilar lymphadenopathy 04/01/2019   Cervical radiculopathy 10/25/2018   Stable angina pectoris 09/30/2018   Overweight (BMI 25.0-29.9) 04/23/2018   DDD (degenerative disc disease), lumbar 11/06/2017   Cardiac murmur 03/16/2017   Lumbar radiculopathy 03/16/2017   Osteoarthritis 06/07/2015   Hyperlipidemia 06/01/2015   Seasonal allergies 01/12/2015   Urticaria 11/30/2014   Degenerative arthritis of hip 10/19/2014   Arthritis of hand, degenerative 10/05/2014   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   Moderate mitral insufficiency 05/01/2014    Past Surgical History:  Procedure Laterality Date   COLONOSCOPY WITH  PROPOFOL N/A 05/15/2017   Procedure: COLONOSCOPY WITH PROPOFOL;  Surgeon: Wyline Mood, MD;  Location: [facility] ENDOSCOPY;  Service: Gastroenterology;  Laterality: N/A;   HAND RECONSTRUCTION Bilateral    as a child    Family History  Problem Relation Age of Onset   Diabetes Brother    Heart attack Mother    Breast cancer Neg Hx    Ovarian cancer Neg Hx    Colon cancer Neg Hx    Heart disease Neg Hx     Social History   Tobacco Use   Smoking status: Former    Packs/day: 1.00    Years: 22.00    Additional pack years: 0.00    Total pack years: 22.00    Types: Cigarettes    Start date: 02/21/1988    Quit date: 02/20/2010    Years since quitting: 12.3   Smokeless tobacco: Never  Vaping Use  Vaping Use: Never used  Substance Use Topics   Alcohol use: No   Drug use: No     Allergies  Allergen Reactions   Tramadol Rash   Tramadol Hcl Rash    Health Maintenance  Topic Date Due   OPHTHALMOLOGY EXAM  Never done   Diabetic kidney evaluation - Urine ACR  06/24/2022   COVID-19 Vaccine (2 - 2023-24 season) 07/12/2022 (Originally 10/21/2021)   Zoster Vaccines- Shingrix (1 of 2) 09/26/2022 (Originally 04/28/2012)   INFLUENZA VACCINE  09/21/2022   HEMOGLOBIN A1C  09/25/2022   Lung Cancer Screening  11/03/2022   Diabetic kidney evaluation - eGFR measurement  03/02/2023   MAMMOGRAM  03/30/2023   FOOT EXAM  06/26/2023   PAP SMEAR-Modifier  11/30/2023   COLONOSCOPY (Pts 45-45yrs Insurance coverage will need to be confirmed)  05/16/2027   DTaP/Tdap/Td (2 - Td or Tdap) 08/19/2029   Hepatitis C Screening  Completed   HIV Screening  Completed   HPV VACCINES  Aged Out    Chart Review Today: I personally reviewed active problem list, medication list, allergies, family history, social history, health maintenance, notes from last encounter, lab results, imaging with the patient/caregiver today.   Review of Systems  Constitutional: Negative.   HENT: Negative.    Eyes: Negative.    Respiratory: Negative.    Cardiovascular: Negative.   Gastrointestinal: Negative.   Endocrine: Negative.   Genitourinary: Negative.   Musculoskeletal: Negative.   Skin: Negative.   Allergic/Immunologic: Negative.   Neurological: Negative.   Hematological: Negative.   Psychiatric/Behavioral: Negative.    All other systems reviewed and are negative.    Objective:   Vitals:   06/26/22 1518  BP: 122/68  Pulse: 73  Resp: 16  Temp: 98 F (36.7 C)  TempSrc: Oral  SpO2: 96%  Weight: 185 lb 14.4 oz (84.3 kg)  Height: 5\' 4"  (1.626 m)    Body mass index is 31.91 kg/m.  Physical Exam Vitals and nursing note reviewed.  Constitutional:      General: She is not in acute distress.    Appearance: Normal appearance. She is well-developed. She is obese. She is not ill-appearing, toxic-appearing or diaphoretic.     Comments: Well appearing, looks stated age and at pt's baseline, central/abdominal obesity  HENT:     Head: Normocephalic and atraumatic.     Right Ear: External ear normal.     Left Ear: External ear normal.     Nose: Nose normal.  Eyes:     General:        Right eye: No discharge.        Left eye: No discharge.     Conjunctiva/sclera: Conjunctivae normal.  Neck:     Trachea: No tracheal deviation.  Cardiovascular:     Rate and Rhythm: Normal rate and regular rhythm.     Pulses: Normal pulses.     Heart sounds: Normal heart sounds.  Pulmonary:     Effort: Pulmonary effort is normal. No respiratory distress.     Breath sounds: Normal breath sounds. No stridor. No wheezing, rhonchi or rales.  Abdominal:     General: Abdomen is protuberant. Bowel sounds are normal.     Palpations: Abdomen is soft.  Musculoskeletal:     Right lower leg: No edema.     Left lower leg: No edema.  Skin:    General: Skin is warm and dry.     Findings: No rash.  Neurological:     Mental Status: She is  alert.     Motor: No abnormal muscle tone.     Coordination: Coordination  normal.  Psychiatric:        Mood and Affect: Mood normal.        Behavior: Behavior normal.        Assessment & Plan:   Problem List Items Addressed This Visit       Cardiovascular and Mediastinum   Frequent PVCs    Not currently symptomatic, on exam RRR      Moderate mitral insufficiency    Per cardiology      Thoracic aortic aneurysm without rupture (HCC)    4.6cm by ct scan 2021         Respiratory   Small airways disease    Per [facility] pulmonology      Relevant Orders   CBC with Differential/Platelet   Sarcoidosis of lung (HCC)    She just completed pulmonary rehab with [facility] Reports DOE sx and improving and she is planning to continue working on exercise tolerance on her own Using only symbicort inhaler      Relevant Orders   CBC with Differential/Platelet     Digestive   Gastroesophageal reflux disease    Intermittent sx, currently well controlled on ppi      Relevant Orders   COMPLETE METABOLIC PANEL WITH GFR   CBC with Differential/Platelet     Endocrine   Type 2 diabetes mellitus without complication, without long-term current use of insulin (HCC) - Primary    On metformin 500 mg BID - higher doses not tolerated Last A1C increased from 6.1 to 7.0 She was given farxiga but could not afford it, she asks to try jardiance - she will f/up with pharmacy about price She is on statin, but not ACEI/ARB - BP will likely tolerate for renal protection - will discuss adding Foot exam done today Needs DM eye exam      Relevant Medications   empagliflozin (JARDIANCE) 10 MG TABS tablet   Other Relevant Orders   Microalbumin / creatinine urine ratio   Ambulatory referral to Ophthalmology   COMPLETE METABOLIC PANEL WITH GFR   Hemoglobin A1c   Lipid panel     Other   Seasonal allergies    Sx well controlled, continue current allergy meds      Relevant Orders   CBC with Differential/Platelet   Hyperlipidemia    Lipids recently done and  reviewed Continue statin Lab Results  Component Value Date   CHOL 151 06/23/2021   HDL 47 (L) 06/23/2021   LDLCALC 69 06/23/2021   TRIG 263 (H) 06/23/2021   CHOLHDL 3.2 06/23/2021        Relevant Orders   Hemoglobin A1c   Lipid panel   Other Visit Diagnoses     Encounter for medication monitoring       Relevant Orders   Microalbumin / creatinine urine ratio   COMPLETE METABOLIC PANEL WITH GFR   Hemoglobin A1c   CBC with Differential/Platelet   Lipid panel   Abdominal bloating       she is concerned about bloated stomach, reports improved GERD sx and normal bowels, she wants GI consult        Return in about 3 months (around 09/26/2022) for DM f/up.   Danelle Berry, PA-C 06/26/22 3:38 PM

## 2022-06-26 NOTE — Assessment & Plan Note (Signed)
Intermittent sx, currently well controlled on ppi

## 2022-06-26 NOTE — Assessment & Plan Note (Signed)
4.6cm by ct scan 2021

## 2022-06-26 NOTE — Assessment & Plan Note (Signed)
On metformin 500 mg BID - higher doses not tolerated Last A1C increased from 6.1 to 7.0 She was given farxiga but could not afford it, she asks to try jardiance - she will f/up with pharmacy about price She is on statin, but not ACEI/ARB - BP will likely tolerate for renal protection - will discuss adding Foot exam done today Needs DM eye exam

## 2022-06-26 NOTE — Assessment & Plan Note (Signed)
Sx well controlled, continue current allergy meds

## 2022-06-26 NOTE — Assessment & Plan Note (Signed)
Not currently symptomatic, on exam RRR

## 2022-06-26 NOTE — Assessment & Plan Note (Signed)
Per [facility] pulmonology

## 2022-06-29 LAB — COMPLETE METABOLIC PANEL WITH GFR
AG Ratio: 1.9 (calc) (ref 1.0–2.5)
ALT: 20 U/L (ref 6–29)
AST: 18 U/L (ref 10–35)
Albumin: 4.3 g/dL (ref 3.6–5.1)
Alkaline phosphatase (APISO): 88 U/L (ref 37–153)
BUN: 19 mg/dL (ref 7–25)
CO2: 26 mmol/L (ref 20–32)
Calcium: 9.7 mg/dL (ref 8.6–10.4)
Chloride: 107 mmol/L (ref 98–110)
Creat: 0.88 mg/dL (ref 0.50–1.05)
Globulin: 2.3 g/dL (calc) (ref 1.9–3.7)
Glucose, Bld: 83 mg/dL (ref 65–99)
Potassium: 4.1 mmol/L (ref 3.5–5.3)
Sodium: 141 mmol/L (ref 135–146)
Total Bilirubin: 0.6 mg/dL (ref 0.2–1.2)
Total Protein: 6.6 g/dL (ref 6.1–8.1)
eGFR: 75 mL/min/{1.73_m2} (ref >=60)

## 2022-06-29 LAB — HEMOGLOBIN A1C
Hgb A1c MFr Bld: 6.5 % of total Hgb — ABNORMAL HIGH (ref <5.7)
Mean Plasma Glucose: 140 mg/dL
eAG (mmol/L): 7.7 mmol/L

## 2022-06-29 LAB — CBC WITH DIFFERENTIAL/PLATELET
Absolute Monocytes: 513 cells/uL (ref 200–950)
Basophils Absolute: 40 cells/uL (ref 0–200)
Basophils Relative: 0.7 %
Eosinophils Absolute: 131 cells/uL (ref 15–500)
Eosinophils Relative: 2.3 %
HCT: 34.9 % — ABNORMAL LOW (ref 35.0–45.0)
Hemoglobin: 11.5 g/dL — ABNORMAL LOW (ref 11.7–15.5)
Lymphs Abs: 2725 cells/uL (ref 850–3900)
MCH: 28.7 pg (ref 27.0–33.0)
MCHC: 33 g/dL (ref 32.0–36.0)
MCV: 87 fL (ref 80.0–100.0)
MPV: 11.5 fL (ref 7.5–12.5)
Monocytes Relative: 9 %
Neutro Abs: 2291 cells/uL (ref 1500–7800)
Neutrophils Relative %: 40.2 %
Platelets: 213 10*3/uL (ref 140–400)
RBC: 4.01 10*6/uL (ref 3.80–5.10)
RDW: 12.8 % (ref 11.0–15.0)
Total Lymphocyte: 47.8 %
WBC: 5.7 10*3/uL (ref 3.8–10.8)

## 2022-06-29 LAB — LIPID PANEL
Cholesterol: 128 mg/dL (ref <200)
HDL: 58 mg/dL (ref >=50)
LDL Cholesterol (Calc): 51 mg/dL (calc)
Non-HDL Cholesterol (Calc): 70 mg/dL (calc) (ref <130)
Total CHOL/HDL Ratio: 2.2 (calc) (ref <5.0)
Triglycerides: 111 mg/dL (ref <150)

## 2022-06-29 LAB — MICROALBUMIN / CREATININE URINE RATIO
Creatinine, Urine: 104 mg/dL (ref 20–275)
Microalb Creat Ratio: 7 mg/g creat (ref <30)
Microalb, Ur: 0.7 mg/dL

## 2022-06-30 NOTE — Addendum Note (Signed)
Addended by: Danelle Berry on: 06/30/2022 12:46 PM   Modules accepted: Orders

## 2022-07-05 DIAGNOSIS — H5203 Hypermetropia, bilateral: Secondary | ICD-10-CM | POA: Diagnosis not present

## 2022-07-05 DIAGNOSIS — H2513 Age-related nuclear cataract, bilateral: Secondary | ICD-10-CM | POA: Diagnosis not present

## 2022-07-05 DIAGNOSIS — E119 Type 2 diabetes mellitus without complications: Secondary | ICD-10-CM | POA: Diagnosis not present

## 2022-07-05 LAB — HM DIABETES EYE EXAM

## 2022-07-07 ENCOUNTER — Other Ambulatory Visit: Payer: Self-pay | Admitting: Family Medicine

## 2022-07-07 DIAGNOSIS — J302 Other seasonal allergic rhinitis: Secondary | ICD-10-CM

## 2022-07-07 DIAGNOSIS — R059 Cough, unspecified: Secondary | ICD-10-CM

## 2022-07-07 DIAGNOSIS — R053 Chronic cough: Secondary | ICD-10-CM

## 2022-07-10 ENCOUNTER — Telehealth: Payer: Self-pay | Admitting: Family Medicine

## 2022-07-10 DIAGNOSIS — R059 Cough, unspecified: Secondary | ICD-10-CM

## 2022-07-10 DIAGNOSIS — R053 Chronic cough: Secondary | ICD-10-CM

## 2022-07-10 NOTE — Telephone Encounter (Signed)
Pt declined to schedule an appt

## 2022-07-10 NOTE — Telephone Encounter (Signed)
Pt is calling in because she went to the pharmacy to refill promethazine-dextromethorphan (PROMETHAZINE-DM) 6.25-15 MG/5ML syrup [[id number]] and was told the prescription has expired. Pt is requesting a new prescription for the medication because pt says she still needs it. Please advise.

## 2022-07-10 NOTE — Telephone Encounter (Signed)
FYI

## 2022-07-10 NOTE — Telephone Encounter (Signed)
That was given for cough, if she is needing it cause she has a cough she needs to be seen to be evaluated.

## 2022-07-11 MED ORDER — PROMETHAZINE-DM 6.25-15 MG/5ML PO SYRP: 2.5000 mL | ORAL_SOLUTION | Freq: Three times a day (TID) | ORAL | 0 refills | Status: DC | PRN

## 2022-07-11 NOTE — Telephone Encounter (Signed)
Pt notified- verbalized understanding.

## 2022-07-11 NOTE — Addendum Note (Signed)
Addended by: Danelle Berry on: 07/11/2022 01:08 PM   Modules accepted: Orders

## 2022-07-24 ENCOUNTER — Ambulatory Visit (INDEPENDENT_AMBULATORY_CARE_PROVIDER_SITE_OTHER): Payer: Medicaid Other | Admitting: Physician Assistant

## 2022-07-24 ENCOUNTER — Ambulatory Visit: Payer: Medicaid Other | Admitting: Physician Assistant

## 2022-07-24 ENCOUNTER — Other Ambulatory Visit: Payer: Self-pay

## 2022-07-24 ENCOUNTER — Encounter: Payer: Self-pay | Admitting: Physician Assistant

## 2022-07-24 VITALS — BP 121/72 | HR 71 | Temp 98.1°F | Ht 64.0 in | Wt 189.0 lb

## 2022-07-24 DIAGNOSIS — D649 Anemia, unspecified: Secondary | ICD-10-CM | POA: Diagnosis not present

## 2022-07-24 DIAGNOSIS — R1907 Generalized intra-abdominal and pelvic swelling, mass and lump: Secondary | ICD-10-CM

## 2022-07-24 MED ORDER — PEG 3350-KCL-NA BICARB-NACL 420 G PO SOLR: 4000.0000 mL | Freq: Once | ORAL | 0 refills | Status: AC

## 2022-07-24 NOTE — Progress Notes (Signed)
Celso Amy, PA-C [address]  [address]  [place], [place] [address]  Main: [phone]  Fax: [phone]   Gastroenterology Consultation  Referring Provider:     Danelle Berry, PA-C Primary Care Physician:  Danelle Berry, PA-C Primary Gastroenterologist:  Dr. Wyline Mood / Celso Amy, PA-C  Reason for Consultation:     Mia and abdominal swelling        HPI:   NISA GRIDER is a 62 y.o. y/o female referred for consultation & management  by Danelle Berry, PA-C.    Patient is here to evaluate Anemia and abdominal swelling.  She reports weight gain.  Her abdomen has gotten larger over the past few years.  She has normal bowel movement every morning.  Denies diarrhea, constipation, hematochezia, or melena.  Has mild occasional abdominal cramping, otherwise no abdominal pain.  Labs 06/26/2022 showed normal CMP.  CBC showed mild anemia with a hemoglobin 11.5, hematocrit 34.9, MCV 87.  First screening colonoscopy done by Dr. Tobi Bastos 04/2017 was normal.  Good prep.  Repeat in 10 years. No previous EGD.  She is scheduled to have a complete pelvic and transvaginal ultrasound in the next few weeks.   Past Medical History:  Diagnosis Date   Arthritis pain    Bilateral leg pain    Chronic nasal congestion    Diabetes mellitus without complication (HCC)    Dizziness of unknown cause    Heart murmur previously undiagnosed    Osteoarthritis, hand    Prediabetes 07/12/2016    Past Surgical History:  Procedure Laterality Date   COLONOSCOPY WITH PROPOFOL N/A 05/15/2017   Procedure: COLONOSCOPY WITH PROPOFOL;  Surgeon: Wyline Mood, MD;  Location: [facility] ENDOSCOPY;  Service: Gastroenterology;  Laterality: N/A;   HAND RECONSTRUCTION Bilateral    as a child    Prior to Admission medications   Medication Sig Start Date End Date Taking? Authorizing Provider  albuterol (VENTOLIN HFA) 108 (90 Base) MCG/ACT inhaler Inhale 2 puffs into the lungs every 4 (four) hours as needed for wheezing or  shortness of breath. 06/06/21   Danelle Berry, PA-C  albuterol (VENTOLIN HFA) 108 (90 Base) MCG/ACT inhaler     [provider]  atorvastatin (LIPITOR) 20 MG tablet TAKE 1 TABLET BY MOUTH AT BEDTIME 12/23/21   Danelle Berry, PA-C  benzonatate (TESSALON) 100 MG capsule TAKE 1 TO 2 CAPSULES BY MOUTH THREE TIMES DAILY AS NEEDED FOR COUGH 07/07/22   Danelle Berry, PA-C  blood glucose meter kit and supplies Dispense based on patient and insurance preference. Use up to four times daily as directed. (FOR ICD-10 E10.9, E11.9). 09/07/20   Caro Laroche, DO  budesonide-formoterol (SYMBICORT) 160-4.5 MCG/ACT inhaler Inhale 2 puffs into the lungs 2 (two) times daily. 06/15/21   Berniece Salines, FNP  celecoxib (CELEBREX) 100 MG capsule Take 1 capsule by mouth 2 (two) times daily. 10/27/21   [provider]  cetirizine (ZYRTEC ALLERGY) 10 MG tablet 1 tablet Orally Once a day for 30 day(s) 11/13/19   [provider]  Cholecalciferol (VITAMIN D3) 2000 UNITS capsule Take 2,000 Units by mouth daily.    [provider]  empagliflozin (JARDIANCE) 10 MG TABS tablet Take 1 tablet (10 mg total) by mouth daily before breakfast. 06/26/22   Danelle Berry, PA-C  EQ ALL DAY ALLERGY RELIEF 10 MG tablet Take 1 tablet by mouth once daily 07/07/22   Danelle Berry, PA-C  fluticasone (FLONASE) 50 MCG/ACT nasal spray Place 2 sprays into both nostrils  daily. 09/19/21   Berniece Salines, FNP  gabapentin (NEURONTIN) 100 MG capsule Take 100 mg by mouth 2 (two) times daily. 03/28/21   [provider]  levocetirizine (XYZAL) 5 MG tablet Take 1 tablet (5 mg total) by mouth every evening. 06/23/21   Danelle Berry, PA-C  meclizine (ANTIVERT) 12.5 MG tablet Take 1 tablet (12.5 mg total) by mouth 3 (three) times daily as needed for dizziness. 09/15/21   Valinda Hoar, NP  metFORMIN (GLUCOPHAGE-XR) 500 MG 24 hr tablet Take 1 tablet (500 mg total) by mouth 2 (two) times daily with a meal. 03/27/22   Margarita Mail, DO   montelukast (SINGULAIR) 10 MG tablet Take 1 tablet (10 mg total) by mouth at bedtime. 06/23/21   Danelle Berry, PA-C  pantoprazole (PROTONIX) 20 MG tablet Take 1 tablet (20 mg total) by mouth 2 (two) times daily. For stomach protection with NSAIDs and GERD 03/27/22   Margarita Mail, DO  promethazine-dextromethorphan (PROMETHAZINE-DM) 6.25-15 MG/5ML syrup Take 2.5-5 mLs by mouth 3 (three) times daily as needed for cough. 07/11/22   Danelle Berry, PA-C  tiZANidine (ZANAFLEX) 4 MG tablet Take 1 tablet (4 mg total) by mouth every 8 (eight) hours as needed for muscle spasms (muscle tightness/spasms). 01/27/22   Danelle Berry, PA-C    Family History  Problem Relation Age of Onset   Diabetes Brother    Heart attack Mother    Breast cancer Neg Hx    Ovarian cancer Neg Hx    Colon cancer Neg Hx    Heart disease Neg Hx      Social History   Tobacco Use   Smoking status: Former    Packs/day: 1.00    Years: 22.00    Additional pack years: 0.00    Total pack years: 22.00    Types: Cigarettes    Start date: 02/21/1988    Quit date: 02/20/2010    Years since quitting: 12.4   Smokeless tobacco: Never  Vaping Use   Vaping Use: Never used  Substance Use Topics   Alcohol use: No   Drug use: No    Allergies as of 07/24/2022 - Review Complete 06/26/2022  Allergen Reaction Noted   Tramadol Rash 10/05/2014   Tramadol hcl Rash 12/12/2021    Review of Systems:    All systems reviewed and negative except where noted in HPI.   Physical Exam:  There were no vitals taken for this visit. No LMP recorded. Patient is postmenopausal. Psych:  Alert and cooperative. Normal mood and affect. General:   Alert,  Well-developed, obese, well-nourished, pleasant and cooperative in NAD Head:  Normocephalic and atraumatic. Eyes:  Sclera clear, no icterus.   Conjunctiva pink. Neck:  Supple; no masses or thyromegaly. Lungs:  Respirations even and unlabored.  Clear throughout to auscultation.   No wheezes, crackles,  or rhonchi. No acute distress. Heart:  Regular rate and rhythm; no murmurs, clicks, rubs, or gallops. Abdomen:  Normal bowel sounds.  No bruits.  Soft, and obese without masses, hepatosplenomegaly or hernias noted.  No Tenderness.  No ascites.  No guarding or rebound tenderness.    Neurologic:  Alert and oriented x3;  grossly normal neurologically. Psych:  Alert and cooperative. Normal mood and affect.  Imaging Studies: No results found.  Assessment and Plan:   KARENANN HEINKE is a 62 y.o. y/o female has been referred for Anemia and Abdominal Swelling.  I am not seeing any evidence of ascites or hepatomegaly on exam today.  Suspect abdominal  swelling is due to obesity.  Anemia Labs: CBC, iron panel, ferritin, B12, folate. Scheduling EGD & Colonoscopy I discussed risks of EGD and colonoscopy with patient to include risk of bleeding, perforation, and risk of sedation.  Patient expressed understanding and agrees to proceed with procedures.    Abdominal Swelling - Due to Obesity  Encouraged weight loss and regular exercise.  Follow up in 4 weeks after procedures.  Celso Amy, PA-C

## 2022-07-25 LAB — CBC
Hematocrit: 36.6 % (ref 34.0–46.6)
Hemoglobin: 12.2 g/dL (ref 11.1–15.9)
MCH: 29.8 pg (ref 26.6–33.0)
MCHC: 33.3 g/dL (ref 31.5–35.7)
MCV: 89 fL (ref 79–97)
Platelets: 244 10*3/uL (ref 150–450)
RBC: 4.1 x10E6/uL (ref 3.77–5.28)
RDW: 13.1 % (ref 11.7–15.4)
WBC: 7 10*3/uL (ref 3.4–10.8)

## 2022-07-25 LAB — IRON,TIBC AND FERRITIN PANEL
Ferritin: 245 ng/mL — ABNORMAL HIGH (ref 15–150)
Iron Saturation: 26 % (ref 15–55)
Iron: 74 ug/dL (ref 27–159)
Total Iron Binding Capacity: 288 ug/dL (ref 250–450)
UIBC: 214 ug/dL (ref 131–425)

## 2022-07-25 LAB — B12 AND FOLATE PANEL
Folate: 19.6 ng/mL (ref >3.0)
Vitamin B-12: 642 pg/mL (ref 232–1245)

## 2022-07-25 NOTE — Progress Notes (Signed)
Notify patient CBC, iron, vitamin B12, and folate are all normal.  No evidence of anemia or vitamin deficiency.  Continue with current plan for EGD and colonoscopy as scheduled.

## 2022-07-27 ENCOUNTER — Telehealth: Payer: Self-pay

## 2022-07-27 NOTE — Telephone Encounter (Signed)
Patient notified.   Notify patient CBC, iron, vitamin B12, and folate are all normal.  No evidence of anemia or vitamin deficiency.  Continue with current plan for EGD and colonoscopy as scheduled.

## 2022-08-30 ENCOUNTER — Ambulatory Visit: Payer: Self-pay | Admitting: *Deleted

## 2022-08-30 NOTE — Telephone Encounter (Signed)
possbile yeast infection   Patient stated she has a possible yeast infection with symptoms of odor and itch. It has been going on for about 2 weeks and she wants to know if provider can call in something for her.

## 2022-08-30 NOTE — Telephone Encounter (Signed)
Second attempt to reach pt, left VM to call back. 

## 2022-08-30 NOTE — Telephone Encounter (Signed)
Third attempt to reach pt, left VM to call back each attempt. Routing to practice for PCPs resolution per protocol.

## 2022-08-31 NOTE — Telephone Encounter (Signed)
Could not get an answer when calling the patient to schedule an appt.

## 2022-08-31 NOTE — Telephone Encounter (Signed)
Pt needs appt

## 2022-09-06 ENCOUNTER — Encounter: Payer: Self-pay | Admitting: Family Medicine

## 2022-09-06 ENCOUNTER — Other Ambulatory Visit (HOSPITAL_COMMUNITY)
Admission: RE | Admit: 2022-09-06 | Discharge: 2022-09-06 | Disposition: A | Discharge status: Home / Self Care | Payer: Medicaid Other | Source: Ambulatory Visit | Attending: Family Medicine | Admitting: Family Medicine

## 2022-09-06 ENCOUNTER — Ambulatory Visit (INDEPENDENT_AMBULATORY_CARE_PROVIDER_SITE_OTHER): Payer: Medicaid Other | Admitting: Family Medicine

## 2022-09-06 VITALS — BP 122/70 | HR 80 | Temp 98.1°F | Resp 16 | Ht 64.0 in | Wt 180.0 lb

## 2022-09-06 DIAGNOSIS — N898 Other specified noninflammatory disorders of vagina: Secondary | ICD-10-CM

## 2022-09-06 NOTE — Progress Notes (Signed)
Patient ID: Brenda Ellison, female    DOB: 08-11-1960, 62 y.o.   MRN: [id number]  PCP: Danelle Berry, PA-C  Chief Complaint  Patient presents with   Vaginitis    Vaginal odor onset for several days    Subjective:   Brenda Ellison is a 62 y.o. female, presents to clinic with CC of the following:  HPI  Vaginal odor, no vulvuvaginal itching, swelling, rash, no discharge Not sexually active for years  Patient Active Problem List   Diagnosis Date Noted   Type 2 diabetes mellitus without complication, without long-term current use of insulin (HCC) 06/30/2021   Sarcoidosis of lung (HCC) 06/06/2021   Pelvic pain 11/29/2020   Gastroesophageal reflux disease 03/18/2020   New onset type 2 diabetes mellitus (HCC) 02/19/2020   Allergic rhinitis 02/17/2020   Thoracic aortic aneurysm without rupture (HCC) 09/23/2019   Small airways disease 05/13/2019   Class 1 obesity with serious comorbidity and body mass index (BMI) of 31.0 to 31.9 in adult 05/13/2019   Family history of early CAD 05/13/2019   Lung nodule 04/01/2019   Wheeze 04/01/2019   Exertional shortness of breath 04/01/2019   Mediastinal lymphadenopathy 04/01/2019   Hilar lymphadenopathy 04/01/2019   Cervical radiculopathy 10/25/2018   Stable angina pectoris 09/30/2018   Overweight (BMI 25.0-29.9) 04/23/2018   DDD (degenerative disc disease), lumbar 11/06/2017   Cardiac murmur 03/16/2017   Lumbar radiculopathy 03/16/2017   Osteoarthritis 06/07/2015   Hyperlipidemia 06/01/2015   Seasonal allergies 01/12/2015   Urticaria 11/30/2014   Degenerative arthritis of hip 10/19/2014   Arthritis of hand, degenerative 10/05/2014   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   Moderate mitral insufficiency 05/01/2014      Current Outpatient Medications:    albuterol (VENTOLIN HFA) 108 (90 Base) MCG/ACT inhaler, Inhale 2 puffs into the lungs every 4 (four) hours as needed for wheezing or shortness of breath., Disp: 18 g, Rfl: 3   albuterol (VENTOLIN  HFA) 108 (90 Base) MCG/ACT inhaler, , Disp: , Rfl:    atorvastatin (LIPITOR) 20 MG tablet, TAKE 1 TABLET BY MOUTH AT BEDTIME, Disp: 30 tablet, Rfl: 8   benzonatate (TESSALON) 100 MG capsule, TAKE 1 TO 2 CAPSULES BY MOUTH THREE TIMES DAILY AS NEEDED FOR COUGH, Disp: 30 capsule, Rfl: 0   blood glucose meter kit and supplies, Dispense based on patient and insurance preference. Use up to four times daily as directed. (FOR ICD-10 E10.9, E11.9)., Disp: 1 each, Rfl: 0   budesonide-formoterol (SYMBICORT) 160-4.5 MCG/ACT inhaler, Inhale 2 puffs into the lungs 2 (two) times daily., Disp: 1 each, Rfl: 0   celecoxib (CELEBREX) 100 MG capsule, Take 1 capsule by mouth 2 (two) times daily., Disp: , Rfl:    cetirizine (ZYRTEC ALLERGY) 10 MG tablet, 1 tablet Orally Once a day for 30 day(s), Disp: , Rfl:    Cholecalciferol (VITAMIN D3) 2000 UNITS capsule, Take 2,000 Units by mouth daily., Disp: , Rfl:    empagliflozin (JARDIANCE) 10 MG TABS tablet, Take 1 tablet (10 mg total) by mouth daily before breakfast., Disp: 30 tablet, Rfl: 2   EQ ALL DAY ALLERGY RELIEF 10 MG tablet, Take 1 tablet by mouth once daily, Disp: 90 tablet, Rfl: 0   fluticasone (FLONASE) 50 MCG/ACT nasal spray, Place 2 sprays into both nostrils daily., Disp: 16 g, Rfl: 6   gabapentin (NEURONTIN) 100 MG capsule, Take 100 mg by mouth 2 (two) times daily., Disp: , Rfl:    levocetirizine (XYZAL) 5 MG tablet, Take 1 tablet (5  mg total) by mouth every evening., Disp: 90 tablet, Rfl: 1   meclizine (ANTIVERT) 12.5 MG tablet, Take 1 tablet (12.5 mg total) by mouth 3 (three) times daily as needed for dizziness., Disp: 30 tablet, Rfl: 0   metFORMIN (GLUCOPHAGE-XR) 500 MG 24 hr tablet, Take 1 tablet (500 mg total) by mouth 2 (two) times daily with a meal., Disp: 90 tablet, Rfl: 1   montelukast (SINGULAIR) 10 MG tablet, Take 1 tablet (10 mg total) by mouth at bedtime., Disp: 30 tablet, Rfl: 3   pantoprazole (PROTONIX) 20 MG tablet, Take 1 tablet (20 mg total) by  mouth 2 (two) times daily. For stomach protection with NSAIDs and GERD, Disp: 60 tablet, Rfl: 1   promethazine-dextromethorphan (PROMETHAZINE-DM) 6.25-15 MG/5ML syrup, Take 2.5-5 mLs by mouth 3 (three) times daily as needed for cough., Disp: 118 mL, Rfl: 0   tiZANidine (ZANAFLEX) 4 MG tablet, Take 1 tablet (4 mg total) by mouth every 8 (eight) hours as needed for muscle spasms (muscle tightness/spasms)., Disp: 30 tablet, Rfl: 1   Allergies  Allergen Reactions   Tramadol Rash   Tramadol Hcl Rash     Social History   Tobacco Use   Smoking status: Former    Current packs/day: 0.00    Average packs/day: 1 pack/day for 22.0 years (22.0 ttl pk-yrs)    Types: Cigarettes    Start date: 02/21/1988    Quit date: 02/20/2010    Years since quitting: 12.5   Smokeless tobacco: Never  Vaping Use   Vaping status: Never Used  Substance Use Topics   Alcohol use: No   Drug use: No      Chart Review Today: I personally reviewed active problem list, medication list, allergies, family history, social history, health maintenance, notes from last encounter, lab results, imaging with the patient/caregiver today.   Review of Systems  Constitutional: Negative.   HENT: Negative.    Eyes: Negative.   Respiratory: Negative.    Cardiovascular: Negative.   Gastrointestinal: Negative.   Endocrine: Negative.   Genitourinary: Negative.   Musculoskeletal: Negative.   Skin: Negative.   Allergic/Immunologic: Negative.   Neurological: Negative.   Hematological: Negative.   Psychiatric/Behavioral: Negative.    All other systems reviewed and are negative.      Objective:   Vitals:   09/06/22 0952  BP: 122/70  Pulse: 80  Resp: 16  Temp: 98.1 F (36.7 C)  TempSrc: Oral  SpO2: 97%  Weight: 180 lb (81.6 kg)  Height: 5\' 4"  (1.626 m)    Body mass index is 30.9 kg/m.  Physical Exam Vitals and nursing note reviewed.  Constitutional:      General: She is not in acute distress.    Appearance:  Normal appearance. She is well-developed. She is obese. She is not ill-appearing, toxic-appearing or diaphoretic.  HENT:     Head: Normocephalic and atraumatic.     Nose: Nose normal.  Eyes:     General:        Right eye: No discharge.        Left eye: No discharge.     Conjunctiva/sclera: Conjunctivae normal.  Neck:     Trachea: No tracheal deviation.  Cardiovascular:     Rate and Rhythm: Normal rate and regular rhythm.  Pulmonary:     Effort: Pulmonary effort is normal. No respiratory distress.     Breath sounds: No stridor.  Musculoskeletal:        General: Normal range of motion.  Skin:  General: Skin is warm and dry.     Findings: No rash.  Neurological:     Mental Status: She is alert.     Motor: No abnormal muscle tone.     Coordination: Coordination normal.  Psychiatric:        Behavior: Behavior normal.      Results for orders placed or performed in visit on 07/24/22  CBC  Result Value Ref Range   WBC 7.0 3.4 - 10.8 x10E3/uL   RBC 4.10 3.77 - 5.28 x10E6/uL   Hemoglobin 12.2 11.1 - 15.9 g/dL   Hematocrit 29.5 62.1 - 46.6 %   MCV 89 79 - 97 fL   MCH 29.8 26.6 - 33.0 pg   MCHC 33.3 31.5 - 35.7 g/dL   RDW 30.8 65.7 - 84.6 %   Platelets 244 150 - 450 x10E3/uL  Iron, TIBC and Ferritin Panel  Result Value Ref Range   Total Iron Binding Capacity 288 250 - 450 ug/dL   UIBC 962 952 - 841 ug/dL   Iron 74 27 - 324 ug/dL   Iron Saturation 26 15 - 55 %   Ferritin 245 (H) 15 - 150 ng/mL  B12 and Folate Panel  Result Value Ref Range   Vitamin B-12 642 232 - 1,245 pg/mL   Folate 19.6 >3.0 ng/mL       Assessment & Plan:   1. Vaginal odor Onset a few days ago, no other sx, no abd pain, urinary sx, discharge, no exposure to STDs not sexually active for years, will screen for yeast and BV, if BV + she would like to treat with metrogel - Cervicovaginal ancillary only    Danelle Berry, PA-C 09/06/22 10:13 AM

## 2022-09-06 NOTE — Patient Instructions (Addendum)
We will call you with results in the next couple days and send in the treatment you need to [facility] at that time.

## 2022-09-07 LAB — CERVICOVAGINAL ANCILLARY ONLY
Bacterial Vaginitis (gardnerella): NEGATIVE
Candida Glabrata: NEGATIVE
Candida Vaginitis: NEGATIVE
Chlamydia: NEGATIVE
Comment: NEGATIVE
Comment: NEGATIVE
Comment: NEGATIVE
Comment: NEGATIVE
Comment: NORMAL
Neisseria Gonorrhea: NEGATIVE

## 2022-09-08 ENCOUNTER — Telehealth: Payer: Self-pay | Admitting: *Deleted

## 2022-09-08 NOTE — Telephone Encounter (Signed)
Per recent swab

## 2022-09-08 NOTE — Telephone Encounter (Signed)
Patient states she got her lab results -but wants to know what she can use for her odor. Patient advised I would ask provider what she recommends and office will call her back with response.

## 2022-09-13 ENCOUNTER — Encounter: Payer: Self-pay | Admitting: Gastroenterology

## 2022-09-13 NOTE — Telephone Encounter (Signed)
Pt notified, she states no referral at the moment due to her going through stuff.

## 2022-09-19 ENCOUNTER — Ambulatory Visit: Payer: Self-pay | Admitting: *Deleted

## 2022-09-19 NOTE — Telephone Encounter (Signed)
Pt is calling in because she has a colonoscopy scheduled for tomorrow and needs to know how to mix the drink she was told to drink before the colonoscopy.

## 2022-09-19 NOTE — Telephone Encounter (Signed)
Attempted to reach pt, left VM each attempt. Routing to practice for PCPs resolution per protocol.  1802: Second attempt,left VM to call pharmacist for assist

## 2022-09-20 ENCOUNTER — Ambulatory Visit: Payer: Medicaid Other | Admitting: Anesthesiology

## 2022-09-20 ENCOUNTER — Encounter
Admission: RE | Disposition: A | Discharge status: Home / Self Care | Payer: Self-pay | Source: Home / Self Care | Attending: Gastroenterology

## 2022-09-20 ENCOUNTER — Encounter: Payer: Self-pay | Admitting: Gastroenterology

## 2022-09-20 ENCOUNTER — Ambulatory Visit
Admission: RE | Admit: 2022-09-20 | Discharge: 2022-09-20 | Disposition: A | Discharge status: Home / Self Care | Payer: Medicaid Other | Attending: Gastroenterology | Admitting: Gastroenterology

## 2022-09-20 ENCOUNTER — Ambulatory Visit: Payer: Self-pay

## 2022-09-20 ENCOUNTER — Other Ambulatory Visit: Payer: Self-pay

## 2022-09-20 DIAGNOSIS — E785 Hyperlipidemia, unspecified: Secondary | ICD-10-CM | POA: Diagnosis not present

## 2022-09-20 DIAGNOSIS — D649 Anemia, unspecified: Secondary | ICD-10-CM | POA: Diagnosis not present

## 2022-09-20 DIAGNOSIS — K219 Gastro-esophageal reflux disease without esophagitis: Secondary | ICD-10-CM | POA: Insufficient documentation

## 2022-09-20 DIAGNOSIS — E119 Type 2 diabetes mellitus without complications: Secondary | ICD-10-CM | POA: Diagnosis not present

## 2022-09-20 DIAGNOSIS — Z79899 Other long term (current) drug therapy: Secondary | ICD-10-CM | POA: Diagnosis not present

## 2022-09-20 DIAGNOSIS — Z87891 Personal history of nicotine dependence: Secondary | ICD-10-CM | POA: Insufficient documentation

## 2022-09-20 DIAGNOSIS — I10 Essential (primary) hypertension: Secondary | ICD-10-CM | POA: Insufficient documentation

## 2022-09-20 HISTORY — PX: ESOPHAGOGASTRODUODENOSCOPY (EGD) WITH PROPOFOL: SHX5813

## 2022-09-20 HISTORY — PX: COLONOSCOPY WITH PROPOFOL: SHX5780

## 2022-09-20 LAB — GLUCOSE, CAPILLARY: Glucose-Capillary: 113 mg/dL — ABNORMAL HIGH (ref 70–99)

## 2022-09-20 SURGERY — COLONOSCOPY WITH PROPOFOL
Anesthesia: General

## 2022-09-20 MED ORDER — PROPOFOL 10 MG/ML IV BOLUS
INTRAVENOUS | Status: DC | PRN
  Administered 2022-09-20: 50 mg via INTRAVENOUS
  Administered 2022-09-20: 150 ug/kg/min via INTRAVENOUS

## 2022-09-20 MED ORDER — LIDOCAINE HCL (CARDIAC) PF 100 MG/5ML IV SOSY
PREFILLED_SYRINGE | INTRAVENOUS | Status: DC | PRN
  Administered 2022-09-20: 200 mg via INTRAVENOUS

## 2022-09-20 MED ORDER — STERILE WATER FOR IRRIGATION IR SOLN
Status: DC | PRN
  Administered 2022-09-20: 100 mL

## 2022-09-20 MED ORDER — SODIUM CHLORIDE 0.9 % IV SOLN: INTRAVENOUS | Status: DC

## 2022-09-20 NOTE — Op Note (Signed)
[facility] Patient Name: Brenda Ellison Procedure Date: 09/20/2022 10:19 AM MRN: [id number] Account #: [id number] Date of Birth: Nov 15, 1960 Admit Type: Outpatient Age: 62 Room: [facility] ENDO ROOM 3 Gender: Female Note Status: Finalized Instrument Name: Patton Salles Endoscope [id number] Procedure:             Upper GI endoscopy Indications:           Anemia Providers:             Wyline Mood MD, MD Medicines:             Monitored Anesthesia Care Complications:         No immediate complications. Procedure:             Pre-Anesthesia Assessment:                        - Prior to the procedure, a History and Physical was                         performed, and patient medications, allergies and                         sensitivities were reviewed. The patient's tolerance                         of previous anesthesia was reviewed.                        - The risks and benefits of the procedure and the                         sedation options and risks were discussed with the                         patient. All questions were answered and informed                         consent was obtained.                        - ASA Grade Assessment: II - A patient with mild                         systemic disease.                        After obtaining informed consent, the endoscope was                         passed under direct vision. Throughout the procedure,                         the patient's blood pressure, pulse, and oxygen                         saturations were monitored continuously. The Endoscope                         was introduced through the mouth, and advanced to the  third part of duodenum. The upper GI endoscopy was                         accomplished with ease. The patient tolerated the                         procedure well. Findings:      The esophagus was normal.      The stomach was normal.      The examined duodenum was  normal. Impression:            - Normal esophagus.                        - Normal stomach.                        - Normal examined duodenum.                        - No specimens collected. Recommendation:        - Perform a colonoscopy today. Procedure Code(s):     --- Professional ---                        [phone], Esophagogastroduodenoscopy, flexible,                         transoral; diagnostic, including collection of                         specimen(s) by brushing or washing, when performed                         (separate procedure) Diagnosis Code(s):     --- Professional ---                        D64.9, Anemia, unspecified CPT copyright 2022 American Medical Association. All rights reserved. The codes documented in this report are preliminary and upon coder review may  be revised to meet current compliance requirements. Wyline Mood, MD Wyline Mood MD, MD 09/20/2022 10:35:01 AM This report has been signed electronically. Number of Addenda: 0 Note Initiated On: 09/20/2022 10:19 AM Estimated Blood Loss:  Estimated blood loss: none.      [facility]

## 2022-09-20 NOTE — Anesthesia Preprocedure Evaluation (Signed)
Anesthesia Evaluation  Patient identified by MRN, date of birth, ID band Patient awake    Reviewed: Allergy & Precautions, H&P , NPO status , Patient's Chart, lab work & pertinent test results, reviewed documented beta blocker date and time   History of Anesthesia Complications Negative for: history of anesthetic complications  Airway Mallampati: II   Neck ROM: full    Dental  (+) Poor Dentition, Teeth Intact, Dental Advidsory Given   Pulmonary neg pulmonary ROS, shortness of breath, neg sleep apnea, neg COPD, neg recent URI, former smoker   Pulmonary exam normal        Cardiovascular Exercise Tolerance: Poor hypertension, On Medications (-) angina (-) Past MI and (-) Cardiac Stents negative cardio ROS Normal cardiovascular exam(-) dysrhythmias + Valvular Problems/Murmurs  Rhythm:regular Rate:Normal     Neuro/Psych neg Seizures  Neuromuscular disease  negative psych ROS   GI/Hepatic Neg liver ROS,GERD  ,,  Endo/Other  diabetes    Renal/GU negative Renal ROS  negative genitourinary   Musculoskeletal   Abdominal   Peds  Hematology negative hematology ROS (+)   Anesthesia Other Findings Past Medical History: No date: Arthritis pain No date: Bilateral leg pain No date: Chronic nasal congestion No date: Dizziness of unknown cause No date: Heart murmur previously undiagnosed No date: Osteoarthritis, hand Past Surgical History: No date: HAND RECONSTRUCTION; Bilateral     Comment:  as a child BMI    Body Mass Index:  27.64 kg/m     Reproductive/Obstetrics negative OB ROS                             Anesthesia Physical Anesthesia Plan  ASA: 3  Anesthesia Plan: General   Post-op Pain Management:    Induction: Intravenous  PONV Risk Score and Plan: 3 and Propofol infusion and TIVA  Airway Management Planned: Natural Airway and Nasal Cannula  Additional Equipment:   Intra-op  Plan:   Post-operative Plan:   Informed Consent: I have reviewed the patients History and Physical, chart, labs and discussed the procedure including the risks, benefits and alternatives for the proposed anesthesia with the patient or authorized representative who has indicated his/her understanding and acceptance.     Dental Advisory Given  Plan Discussed with: CRNA  Anesthesia Plan Comments:         Anesthesia Quick Evaluation

## 2022-09-20 NOTE — Telephone Encounter (Signed)
Called pt no answer left vm stating to call GI office for instructions or pharmacist so they could assist her

## 2022-09-20 NOTE — Telephone Encounter (Signed)
  Chief Complaint: Pt called back to say she was on her way home from colonoscopy and it was all clear. Pt just returning call. No other needs. Disposition: [] ED /[] Urgent Care (no appt availability in office) / [] Appointment(In office/virtual)/ []  [facility]/ [x] Home Care/ [] Refused Recommended Disposition /[] [facility]/ []  Follow-up with PCP Additional Notes:  Reason for Disposition  [1] Follow-up call to recent contact AND [2] information only call, no triage required  Answer Assessment - Initial Assessment Questions 1. REASON FOR CALL or QUESTION: "What is your reason for calling today?" or "How can I best help you?" or "What question do you have that I can help answer?"     Pt called back to say she was on her way home from colonoscopy and it was all clear. Pt just returning call. No other needs.  Protocols used: Information Only Call - No Triage-A-AH

## 2022-09-20 NOTE — Op Note (Signed)
[facility] Patient Name: Brenda Ellison Procedure Date: 09/20/2022 10:19 AM MRN: [id number] Account #: [id number] Date of Birth: Jul 14, 1960 Admit Type: Outpatient Age: 62 Room: [facility] ENDO ROOM 3 Gender: Female Note Status: Finalized Instrument Name: Prentice Docker [id number] Procedure:             Colonoscopy Indications:           Anemia Providers:             Wyline Mood MD, MD Medicines:             Monitored Anesthesia Care Complications:         No immediate complications. Procedure:             Pre-Anesthesia Assessment:                        - Prior to the procedure, a History and Physical was                         performed, and patient medications, allergies and                         sensitivities were reviewed. The patient's tolerance                         of previous anesthesia was reviewed.                        - The risks and benefits of the procedure and the                         sedation options and risks were discussed with the                         patient. All questions were answered and informed                         consent was obtained.                        - ASA Grade Assessment: II - A patient with mild                         systemic disease.                        After obtaining informed consent, the colonoscope was                         passed under direct vision. Throughout the procedure,                         the patient's blood pressure, pulse, and oxygen                         saturations were monitored continuously. The                         Colonoscope was introduced through the anus and  advanced to the the cecum, identified by the                         appendiceal orifice. The colonoscopy was performed                         with ease. The patient tolerated the procedure well.                         The quality of the bowel preparation was excellent.                          The ileocecal valve, appendiceal orifice, and rectum                         were photographed. Findings:      The entire examined colon appeared normal on direct and retroflexion       views. Impression:            - The entire examined colon is normal on direct and                         retroflexion views.                        - No specimens collected. Recommendation:        - Discharge patient to home (with escort).                        - Resume previous diet.                        - Continue present medications.                        - Repeat colonoscopy in 10 years for screening                         purposes. Procedure Code(s):     --- Professional ---                        [phone], Colonoscopy, flexible; diagnostic, including                         collection of specimen(s) by brushing or washing, when                         performed (separate procedure) Diagnosis Code(s):     --- Professional ---                        D64.9, Anemia, unspecified CPT copyright 2022 American Medical Association. All rights reserved. The codes documented in this report are preliminary and upon coder review may  be revised to meet current compliance requirements. Wyline Mood, MD Wyline Mood MD, MD 09/20/2022 10:46:11 AM This report has been signed electronically. Number of Addenda: 0 Note Initiated On: 09/20/2022 10:19 AM Scope Withdrawal Time: 0 hours 6 minutes 36 seconds  Total Procedure Duration: 0 hours 8 minutes 28 seconds  Estimated Blood Loss:  Estimated blood loss: none.  [facility]

## 2022-09-20 NOTE — H&P (Signed)
Wyline Mood, MD [address], [place], [place], [address] [address], [place], [place], [address] Phone: [phone]  Fax: [phone]  Primary Care Physician:  Danelle Berry, PA-C   Pre-Procedure History & Physical: HPI:  Brenda Ellison is a 62 y.o. female is here for an endoscopy and colonoscopy    Past Medical History:  Diagnosis Date   Arthritis pain    Bilateral leg pain    Chronic nasal congestion    Diabetes mellitus without complication (HCC)    Dizziness of unknown cause    Heart murmur previously undiagnosed    Osteoarthritis, hand    Prediabetes 07/12/2016    Past Surgical History:  Procedure Laterality Date   COLONOSCOPY WITH PROPOFOL N/A 05/15/2017   Procedure: COLONOSCOPY WITH PROPOFOL;  Surgeon: Wyline Mood, MD;  Location: [facility] ENDOSCOPY;  Service: Gastroenterology;  Laterality: N/A;   HAND RECONSTRUCTION Bilateral    as a child    Prior to Admission medications   Medication Sig Start Date End Date Taking? Authorizing Provider  albuterol (VENTOLIN HFA) 108 (90 Base) MCG/ACT inhaler Inhale 2 puffs into the lungs every 4 (four) hours as needed for wheezing or shortness of breath. 06/06/21  Yes Danelle Berry, PA-C  atorvastatin (LIPITOR) 20 MG tablet TAKE 1 TABLET BY MOUTH AT BEDTIME 12/23/21  Yes Tapia, Leisa, PA-C  benzonatate (TESSALON) 100 MG capsule TAKE 1 TO 2 CAPSULES BY MOUTH THREE TIMES DAILY AS NEEDED FOR COUGH 07/07/22  Yes Danelle Berry, PA-C  budesonide-formoterol (SYMBICORT) 160-4.5 MCG/ACT inhaler Inhale 2 puffs into the lungs 2 (two) times daily. 06/15/21  Yes Berniece Salines, FNP  celecoxib (CELEBREX) 100 MG capsule Take 1 capsule by mouth 2 (two) times daily. 10/27/21  Yes [provider]  Cholecalciferol (VITAMIN D3) 2000 UNITS capsule Take 2,000 Units by mouth daily.   Yes [provider]  fluticasone (FLONASE) 50 MCG/ACT nasal spray Place 2 sprays into both nostrils daily. 09/19/21  Yes Berniece Salines,  FNP  gabapentin (NEURONTIN) 100 MG capsule Take 100 mg by mouth 2 (two) times daily. 03/28/21  Yes [provider]  levocetirizine (XYZAL) 5 MG tablet Take 1 tablet (5 mg total) by mouth every evening. 06/23/21  Yes Danelle Berry, PA-C  metFORMIN (GLUCOPHAGE-XR) 500 MG 24 hr tablet Take 1 tablet (500 mg total) by mouth 2 (two) times daily with a meal. 03/27/22  Yes Margarita Mail, DO  montelukast (SINGULAIR) 10 MG tablet Take 1 tablet (10 mg total) by mouth at bedtime. 06/23/21  Yes Danelle Berry, PA-C  pantoprazole (PROTONIX) 20 MG tablet Take 1 tablet (20 mg total) by mouth 2 (two) times daily. For stomach protection with NSAIDs and GERD 03/27/22  Yes Margarita Mail, DO  tiZANidine (ZANAFLEX) 4 MG tablet Take 1 tablet (4 mg total) by mouth every 8 (eight) hours as needed for muscle spasms (muscle tightness/spasms). 01/27/22  Yes Danelle Berry, PA-C  albuterol (VENTOLIN HFA) 108 (90 Base) MCG/ACT inhaler     [provider]  blood glucose meter kit and supplies Dispense based on patient and insurance preference. Use up to four times daily as directed. (FOR ICD-10 E10.9, E11.9). 09/07/20   Caro Laroche, DO  cetirizine (ZYRTEC ALLERGY) 10 MG tablet 1 tablet Orally Once a day for 30 day(s) 11/13/19   [provider]  empagliflozin (JARDIANCE) 10 MG TABS tablet Take 1 tablet (10 mg total) by mouth daily before breakfast. Patient not taking: Reported on 09/20/2022 06/26/22  Danelle Berry, PA-C  EQ ALL DAY ALLERGY RELIEF 10 MG tablet Take 1 tablet by mouth once daily 07/07/22   Danelle Berry, PA-C  meclizine (ANTIVERT) 12.5 MG tablet Take 1 tablet (12.5 mg total) by mouth 3 (three) times daily as needed for dizziness. 09/15/21   Valinda Hoar, NP  promethazine-dextromethorphan (PROMETHAZINE-DM) 6.25-15 MG/5ML syrup Take 2.5-5 mLs by mouth 3 (three) times daily as needed for cough. 07/11/22   Danelle Berry, PA-C    Allergies as of 07/24/2022 - Review Complete 07/24/2022  Allergen  Reaction Noted   Tramadol Rash 10/05/2014   Tramadol hcl Rash 12/12/2021    Family History  Problem Relation Age of Onset   Diabetes Brother    Heart attack Mother    Breast cancer Neg Hx    Ovarian cancer Neg Hx    Colon cancer Neg Hx    Heart disease Neg Hx     Social History   Socioeconomic History   Marital status: Single    Spouse name: Not on file   Number of children: 3   Years of education: Not on file   Highest education level: Not on file  Occupational History   Not on file  Tobacco Use   Smoking status: Former    Current packs/day: 0.00    Average packs/day: 1 pack/day for 22.0 years (22.0 ttl pk-yrs)    Types: Cigarettes    Start date: 02/21/1988    Quit date: 02/20/2010    Years since quitting: 12.5   Smokeless tobacco: Never  Vaping Use   Vaping status: Never Used  Substance and Sexual Activity   Alcohol use: No   Drug use: No   Sexual activity: Not Currently    Birth control/protection: Post-menopausal  Other Topics Concern   Not on file  Social History Narrative   Widowed   Works full time   Carbonated beverages   Social Determinants of Health   Financial Resource Strain: Low Risk  (02/17/2020)   Overall Financial Resource Strain (CARDIA)    Difficulty of Paying Living Expenses: Not hard at all  Food Insecurity: No Food Insecurity (02/17/2020)   Hunger Vital Sign    Worried About Running Out of Food in the Last Year: Never true    Ran Out of Food in the Last Year: Never true  Transportation Needs: No Transportation Needs (02/17/2020)   PRAPARE - Administrator, Civil Service (Medical): No    Lack of Transportation (Non-Medical): No  Physical Activity: Inactive (02/17/2020)   Exercise Vital Sign    Days of Exercise per Week: 0 days    Minutes of Exercise per Session: 0 min  Stress: No Stress Concern Present (02/17/2020)   [employer] of Occupational Health - Occupational Stress Questionnaire    Feeling of Stress : Only a  little  Social Connections: Moderately Isolated (02/17/2020)   Social Connection and Isolation Panel [NHANES]    Frequency of Communication with Friends and Family: Once a week    Frequency of Social Gatherings with Friends and Family: Once a week    Attends Religious Services: 1 to 4 times per year    Active Member of [employer] or Organizations: Yes    Attends Banker Meetings: More than 4 times per year    Marital Status: Never married  Intimate Partner Violence: Not At Risk (02/17/2020)   Humiliation, Afraid, Rape, and Kick questionnaire    Fear of Current or Ex-Partner: No    Emotionally Abused:  No    Physically Abused: No    Sexually Abused: No    Review of Systems: See HPI, otherwise negative ROS  Physical Exam: BP (!) 153/88   Pulse 62   Temp (!) 96 F (35.6 C) (Temporal)   Resp 18   Ht 5\' 4"  (1.626 m)   Wt 82.2 kg   SpO2 97%   BMI 31.10 kg/m  General:   Alert,  pleasant and cooperative in NAD Head:  Normocephalic and atraumatic. Neck:  Supple; no masses or thyromegaly. Lungs:  Clear throughout to auscultation, normal respiratory effort.    Heart:  +S1, +S2, Regular rate and rhythm, No edema. Abdomen:  Soft, nontender and nondistended. Normal bowel sounds, without guarding, and without rebound.   Neurologic:  Alert and  oriented x4;  grossly normal neurologically.  Impression/Plan: Brenda Ellison is here for an endoscopy and colonoscopy  to be performed for  evaluation of anemia    Risks, benefits, limitations, and alternatives regarding endoscopy have been reviewed with the patient.  Questions have been answered.  All parties agreeable.   Wyline Mood, MD  09/20/2022, 9:52 AM

## 2022-09-20 NOTE — Transfer of Care (Signed)
Immediate Anesthesia Transfer of Care Note  Patient: Brenda Ellison  Procedure(s) Performed: COLONOSCOPY WITH PROPOFOL ESOPHAGOGASTRODUODENOSCOPY (EGD) WITH PROPOFOL  Patient Location: PACU  Anesthesia Type:General  Level of Consciousness: drowsy and patient cooperative  Airway & Oxygen Therapy: Patient Spontanous Breathing  Post-op Assessment: Report given to RN and Post -op Vital signs reviewed and stable  Post vital signs: stable  Last Vitals:  Vitals Value Taken Time  BP    Temp    Pulse 73 09/20/22 1048  Resp 24 09/20/22 1048  SpO2 100 % 09/20/22 1048  Vitals shown include unfiled device data.  Last Pain:  Vitals:   09/20/22 0922  TempSrc: Temporal  PainSc: 0-No pain         Complications: No notable events documented.

## 2022-09-21 ENCOUNTER — Encounter: Payer: Self-pay | Admitting: Gastroenterology

## 2022-09-21 NOTE — Anesthesia Postprocedure Evaluation (Signed)
Anesthesia Post Note  Patient: Brenda Ellison  Procedure(s) Performed: COLONOSCOPY WITH PROPOFOL ESOPHAGOGASTRODUODENOSCOPY (EGD) WITH PROPOFOL  Patient location during evaluation: Endoscopy Anesthesia Type: General Level of consciousness: awake and alert Pain management: pain level controlled Vital Signs Assessment: post-procedure vital signs reviewed and stable Respiratory status: spontaneous breathing, nonlabored ventilation, respiratory function stable and patient connected to nasal cannula oxygen Cardiovascular status: blood pressure returned to baseline and stable Postop Assessment: no apparent nausea or vomiting Anesthetic complications: no   There were no known notable events for this encounter.   Last Vitals:  Vitals:   09/20/22 1048 09/20/22 1108  BP:  (!) 150/88  Pulse:    Resp:    Temp: (!) 36.2 C   SpO2:      Last Pain:  Vitals:   09/20/22 1108  TempSrc:   PainSc: 0-No pain                 Lenard Simmer

## 2022-09-26 ENCOUNTER — Ambulatory Visit: Payer: BLUE CROSS/BLUE SHIELD | Admitting: Family Medicine

## 2022-09-26 DIAGNOSIS — E119 Type 2 diabetes mellitus without complications: Secondary | ICD-10-CM

## 2022-09-27 ENCOUNTER — Encounter: Payer: Self-pay | Admitting: Family Medicine

## 2022-09-27 ENCOUNTER — Ambulatory Visit (INDEPENDENT_AMBULATORY_CARE_PROVIDER_SITE_OTHER): Payer: Medicaid Other | Admitting: Family Medicine

## 2022-09-27 VITALS — BP 126/78 | HR 80 | Temp 97.9°F | Resp 16 | Ht 64.0 in | Wt 183.1 lb

## 2022-09-27 DIAGNOSIS — E782 Mixed hyperlipidemia: Secondary | ICD-10-CM | POA: Diagnosis not present

## 2022-09-27 DIAGNOSIS — E669 Obesity, unspecified: Secondary | ICD-10-CM

## 2022-09-27 DIAGNOSIS — Z6831 Body mass index (BMI) 31.0-31.9, adult: Secondary | ICD-10-CM

## 2022-09-27 DIAGNOSIS — E119 Type 2 diabetes mellitus without complications: Secondary | ICD-10-CM | POA: Diagnosis not present

## 2022-09-27 DIAGNOSIS — Z7984 Long term (current) use of oral hypoglycemic drugs: Secondary | ICD-10-CM | POA: Diagnosis not present

## 2022-09-27 DIAGNOSIS — K219 Gastro-esophageal reflux disease without esophagitis: Secondary | ICD-10-CM | POA: Diagnosis not present

## 2022-09-27 MED ORDER — METFORMIN HCL ER 500 MG PO TB24
500.0000 mg | ORAL_TABLET | Freq: Two times a day (BID) | ORAL | 1 refills | Status: DC
Start: 2022-09-27 — End: 2022-12-25

## 2022-09-27 NOTE — Progress Notes (Unsigned)
Name: Brenda Ellison   MRN: [id number]    DOB: [birth year]   Date:09/27/2022       Progress Note  Chief Complaint  Patient presents with   Follow-up   Diabetes     Subjective:   Brenda Ellison is a 62 y.o. female, presents to clinic for f/up on chronic conditions A1C improved with last 2 OV She is on statin but not on ACEI/ARB BP a little elevated today BP Readings from Last 3 Encounters:  09/27/22 (!) 142/80  09/20/22 (!) 150/88  09/06/22 122/70        Current Outpatient Medications:    albuterol (VENTOLIN HFA) 108 (90 Base) MCG/ACT inhaler, Inhale 2 puffs into the lungs every 4 (four) hours as needed for wheezing or shortness of breath., Disp: 18 g, Rfl: 3   albuterol (VENTOLIN HFA) 108 (90 Base) MCG/ACT inhaler, , Disp: , Rfl:    atorvastatin (LIPITOR) 20 MG tablet, TAKE 1 TABLET BY MOUTH AT BEDTIME, Disp: 30 tablet, Rfl: 8   benzonatate (TESSALON) 100 MG capsule, TAKE 1 TO 2 CAPSULES BY MOUTH THREE TIMES DAILY AS NEEDED FOR COUGH, Disp: 30 capsule, Rfl: 0   blood glucose meter kit and supplies, Dispense based on patient and insurance preference. Use up to four times daily as directed. (FOR ICD-10 E10.9, E11.9)., Disp: 1 each, Rfl: 0   budesonide-formoterol (SYMBICORT) 160-4.5 MCG/ACT inhaler, Inhale 2 puffs into the lungs 2 (two) times daily., Disp: 1 each, Rfl: 0   celecoxib (CELEBREX) 100 MG capsule, Take 1 capsule by mouth 2 (two) times daily., Disp: , Rfl:    cetirizine (ZYRTEC ALLERGY) 10 MG tablet, 1 tablet Orally Once a day for 30 day(s), Disp: , Rfl:    Cholecalciferol (VITAMIN D3) 2000 UNITS capsule, Take 2,000 Units by mouth daily., Disp: , Rfl:    empagliflozin (JARDIANCE) 10 MG TABS tablet, Take 1 tablet (10 mg total) by mouth daily before breakfast., Disp: 30 tablet, Rfl: 2   EQ ALL DAY ALLERGY RELIEF 10 MG tablet, Take 1 tablet by mouth once daily, Disp: 90 tablet, Rfl: 0   fluticasone (FLONASE) 50 MCG/ACT nasal spray, Place 2 sprays into both nostrils daily.,  Disp: 16 g, Rfl: 6   gabapentin (NEURONTIN) 100 MG capsule, Take 100 mg by mouth 2 (two) times daily., Disp: , Rfl:    levocetirizine (XYZAL) 5 MG tablet, Take 1 tablet (5 mg total) by mouth every evening., Disp: 90 tablet, Rfl: 1   meclizine (ANTIVERT) 12.5 MG tablet, Take 1 tablet (12.5 mg total) by mouth 3 (three) times daily as needed for dizziness., Disp: 30 tablet, Rfl: 0   metFORMIN (GLUCOPHAGE-XR) 500 MG 24 hr tablet, Take 1 tablet (500 mg total) by mouth 2 (two) times daily with a meal., Disp: 90 tablet, Rfl: 1   montelukast (SINGULAIR) 10 MG tablet, Take 1 tablet (10 mg total) by mouth at bedtime., Disp: 30 tablet, Rfl: 3   pantoprazole (PROTONIX) 20 MG tablet, Take 1 tablet (20 mg total) by mouth 2 (two) times daily. For stomach protection with NSAIDs and GERD, Disp: 60 tablet, Rfl: 1   promethazine-dextromethorphan (PROMETHAZINE-DM) 6.25-15 MG/5ML syrup, Take 2.5-5 mLs by mouth 3 (three) times daily as needed for cough., Disp: 118 mL, Rfl: 0   tiZANidine (ZANAFLEX) 4 MG tablet, Take 1 tablet (4 mg total) by mouth every 8 (eight) hours as needed for muscle spasms (muscle tightness/spasms)., Disp: 30 tablet, Rfl: 1  Patient Active Problem List   Diagnosis Date Noted  Anemia 09/20/2022   Type 2 diabetes mellitus without complication, without long-term current use of insulin (HCC) 06/30/2021   Sarcoidosis of lung (HCC) 06/06/2021   Pelvic pain 11/29/2020   Gastroesophageal reflux disease 03/18/2020   New onset type 2 diabetes mellitus (HCC) 02/19/2020   Allergic rhinitis 02/17/2020   Thoracic aortic aneurysm without rupture (HCC) 09/23/2019   Small airways disease 05/13/2019   Class 1 obesity with serious comorbidity and body mass index (BMI) of 31.0 to 31.9 in adult 05/13/2019   Family history of early CAD 05/13/2019   Lung nodule 04/01/2019   Wheeze 04/01/2019   Exertional shortness of breath 04/01/2019   Mediastinal lymphadenopathy 04/01/2019   Hilar lymphadenopathy 04/01/2019    Cervical radiculopathy 10/25/2018   Stable angina pectoris 09/30/2018   Overweight (BMI 25.0-29.9) 04/23/2018   DDD (degenerative disc disease), lumbar 11/06/2017   Cardiac murmur 03/16/2017   Lumbar radiculopathy 03/16/2017   Osteoarthritis 06/07/2015   Hyperlipidemia 06/01/2015   Seasonal allergies 01/12/2015   Urticaria 11/30/2014   Degenerative arthritis of hip 10/19/2014   Arthritis of hand, degenerative 10/05/2014   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   Moderate mitral insufficiency 05/01/2014    Past Surgical History:  Procedure Laterality Date   COLONOSCOPY WITH PROPOFOL N/A 05/15/2017   Procedure: COLONOSCOPY WITH PROPOFOL;  Surgeon: Wyline Mood, MD;  Location: [facility] ENDOSCOPY;  Service: Gastroenterology;  Laterality: N/A;   COLONOSCOPY WITH PROPOFOL N/A 09/20/2022   Procedure: COLONOSCOPY WITH PROPOFOL;  Surgeon: Wyline Mood, MD;  Location: [facility] ENDOSCOPY;  Service: Gastroenterology;  Laterality: N/A;   ESOPHAGOGASTRODUODENOSCOPY (EGD) WITH PROPOFOL N/A 09/20/2022   Procedure: ESOPHAGOGASTRODUODENOSCOPY (EGD) WITH PROPOFOL;  Surgeon: Wyline Mood, MD;  Location: [facility] ENDOSCOPY;  Service: Gastroenterology;  Laterality: N/A;   HAND RECONSTRUCTION Bilateral    as a child    Family History  Problem Relation Age of Onset   Diabetes Brother    Heart attack Mother    Breast cancer Neg Hx    Ovarian cancer Neg Hx    Colon cancer Neg Hx    Heart disease Neg Hx     Social History   Tobacco Use   Smoking status: Former    Current packs/day: 0.00    Average packs/day: 1 pack/day for 22.0 years (22.0 ttl pk-yrs)    Types: Cigarettes    Start date: 02/21/1988    Quit date: 02/20/2010    Years since quitting: 12.6   Smokeless tobacco: Never  Vaping Use   Vaping status: Never Used  Substance Use Topics   Alcohol use: No   Drug use: No     Allergies  Allergen Reactions   Tramadol Rash   Tramadol Hcl Rash    Health Maintenance  Topic Date Due   INFLUENZA VACCINE  09/21/2022    COVID-19 Vaccine (2 - 2023-24 season) 10/13/2022 (Originally 10/21/2021)   Zoster Vaccines- Shingrix (1 of 2) 12/28/2022 (Originally 04/28/2012)   Lung Cancer Screening  11/03/2022   HEMOGLOBIN A1C  12/27/2022   MAMMOGRAM  03/30/2023   Diabetic kidney evaluation - eGFR measurement  06/26/2023   Diabetic kidney evaluation - Urine ACR  06/26/2023   FOOT EXAM  06/26/2023   OPHTHALMOLOGY EXAM  07/05/2023   PAP SMEAR-Modifier  11/30/2023   DTaP/Tdap/Td (2 - Td or Tdap) 08/19/2029   Colonoscopy  09/19/2032   Hepatitis C Screening  Completed   HIV Screening  Completed   HPV VACCINES  Aged Out    Chart Review Today: I personally reviewed active problem list, medication list, allergies,  family history, social history, health maintenance, notes from last encounter, lab results, imaging with the patient/caregiver today.   Review of Systems  Constitutional: Negative.   HENT: Negative.    Eyes: Negative.   Respiratory: Negative.    Cardiovascular: Negative.   Gastrointestinal: Negative.   Endocrine: Negative.   Genitourinary: Negative.   Musculoskeletal: Negative.   Skin: Negative.   Allergic/Immunologic: Negative.   Neurological: Negative.   Hematological: Negative.   Psychiatric/Behavioral: Negative.    All other systems reviewed and are negative.    Objective:   Vitals:   09/27/22 1318  BP: (!) 142/80  Pulse: 80  Resp: 16  Temp: 97.9 F (36.6 C)  TempSrc: Oral  SpO2: 97%  Weight: 183 lb 1.6 oz (83.1 kg)  Height: 5\' 4"  (1.626 m)    Body mass index is 31.43 kg/m.  Physical Exam      Assessment & Plan:   Problem List Items Addressed This Visit       Endocrine   Type 2 diabetes mellitus without complication, without long-term current use of insulin (HCC) - Primary     No follow-ups on file.   Danelle Berry, PA-C 09/27/22 1:35 PM

## 2022-09-28 ENCOUNTER — Encounter: Payer: Self-pay | Admitting: Family Medicine

## 2022-10-18 NOTE — Progress Notes (Unsigned)
Celso Amy, PA-C [address]  [address]  [place], [place] [address]  Main: [phone]  Fax: [phone]   Primary Care Physician: Danelle Berry, PA-C  Primary Gastroenterologist:  Celso Amy, PA-C / Dr. Wyline Mood    CC: F/U Anemia and Abd Swelling  HPI: Brenda Ellison is a 62 y.o. female returns for 27-month follow-up of anemia and abdominal swelling.  Labs 07/24/2022 showed normal CBC (hemoglobin 12.2).  Normal iron, vitamin B12, and folate.  No evidence of deficiency.  EGD 09/20/2022 by Dr. Tobi Bastos was normal.  Colonoscopy 09/20/2022 by Dr. Tobi Bastos was normal.  No polyps.  Excellent prep.  10-year repeat.  First screening colonoscopy done by Dr. Tobi Bastos 04/2017 was normal.  Good prep.  Repeat in 10 years.    Current Outpatient Medications  Medication Sig Dispense Refill   albuterol (VENTOLIN HFA) 108 (90 Base) MCG/ACT inhaler Inhale 2 puffs into the lungs every 4 (four) hours as needed for wheezing or shortness of breath. 18 g 3   albuterol (VENTOLIN HFA) 108 (90 Base) MCG/ACT inhaler      atorvastatin (LIPITOR) 20 MG tablet TAKE 1 TABLET BY MOUTH AT BEDTIME 30 tablet 8   benzonatate (TESSALON) 100 MG capsule TAKE 1 TO 2 CAPSULES BY MOUTH THREE TIMES DAILY AS NEEDED FOR COUGH 30 capsule 0   blood glucose meter kit and supplies Dispense based on patient and insurance preference. Use up to four times daily as directed. (FOR ICD-10 E10.9, E11.9). 1 each 0   budesonide-formoterol (SYMBICORT) 160-4.5 MCG/ACT inhaler Inhale 2 puffs into the lungs 2 (two) times daily. 1 each 0   celecoxib (CELEBREX) 100 MG capsule Take 1 capsule by mouth 2 (two) times daily.     cetirizine (ZYRTEC ALLERGY) 10 MG tablet 1 tablet Orally Once a day for 30 day(s)     Cholecalciferol (VITAMIN D3) 2000 UNITS capsule Take 2,000 Units by mouth daily.     empagliflozin (JARDIANCE) 10 MG TABS tablet Take 1 tablet (10 mg total) by mouth daily before breakfast. 30 tablet 2   EQ ALL DAY ALLERGY RELIEF 10 MG  tablet Take 1 tablet by mouth once daily 90 tablet 0   fluticasone (FLONASE) 50 MCG/ACT nasal spray Place 2 sprays into both nostrils daily. 16 g 6   gabapentin (NEURONTIN) 100 MG capsule Take 100 mg by mouth 2 (two) times daily.     levocetirizine (XYZAL) 5 MG tablet Take 1 tablet (5 mg total) by mouth every evening. 90 tablet 1   meclizine (ANTIVERT) 12.5 MG tablet Take 1 tablet (12.5 mg total) by mouth 3 (three) times daily as needed for dizziness. 30 tablet 0   metFORMIN (GLUCOPHAGE-XR) 500 MG 24 hr tablet Take 1 tablet (500 mg total) by mouth 2 (two) times daily with a meal. 90 tablet 1   montelukast (SINGULAIR) 10 MG tablet Take 1 tablet (10 mg total) by mouth at bedtime. 30 tablet 3   pantoprazole (PROTONIX) 20 MG tablet Take 1 tablet (20 mg total) by mouth 2 (two) times daily. For stomach protection with NSAIDs and GERD 60 tablet 1   promethazine-dextromethorphan (PROMETHAZINE-DM) 6.25-15 MG/5ML syrup Take 2.5-5 mLs by mouth 3 (three) times daily as needed for cough. 118 mL 0   tiZANidine (ZANAFLEX) 4 MG tablet Take 1 tablet (4 mg total) by mouth every 8 (eight) hours as needed for muscle spasms (muscle tightness/spasms). 30 tablet 1   No current facility-administered medications for this visit.    Allergies as of  10/19/2022 - Review Complete 09/28/2022  Allergen Reaction Noted   Tramadol Rash 10/05/2014   Tramadol hcl Rash 12/12/2021    Past Medical History:  Diagnosis Date   Arthritis pain    Bilateral leg pain    Chronic nasal congestion    Diabetes mellitus without complication (HCC)    Dizziness of unknown cause    Heart murmur previously undiagnosed    Osteoarthritis, hand    Prediabetes 07/12/2016    Past Surgical History:  Procedure Laterality Date   COLONOSCOPY WITH PROPOFOL N/A 05/15/2017   Procedure: COLONOSCOPY WITH PROPOFOL;  Surgeon: Wyline Mood, MD;  Location: [facility] ENDOSCOPY;  Service: Gastroenterology;  Laterality: N/A;   COLONOSCOPY WITH PROPOFOL N/A  09/20/2022   Procedure: COLONOSCOPY WITH PROPOFOL;  Surgeon: Wyline Mood, MD;  Location: [facility] ENDOSCOPY;  Service: Gastroenterology;  Laterality: N/A;   ESOPHAGOGASTRODUODENOSCOPY (EGD) WITH PROPOFOL N/A 09/20/2022   Procedure: ESOPHAGOGASTRODUODENOSCOPY (EGD) WITH PROPOFOL;  Surgeon: Wyline Mood, MD;  Location: [facility] ENDOSCOPY;  Service: Gastroenterology;  Laterality: N/A;   HAND RECONSTRUCTION Bilateral    as a child    Review of Systems:    All systems reviewed and negative except where noted in HPI.   Physical Examination:   There were no vitals taken for this visit.  General: Well-nourished, well-developed in no acute distress.  Lungs: Clear to auscultation bilaterally. Non-labored. Heart: Regular rate and rhythm, no murmurs rubs or gallops.  Abdomen: Bowel sounds are normal; Abdomen is Soft; No hepatosplenomegaly, masses or hernias;  No Abdominal Tenderness; No guarding or rebound tenderness. Neuro: Alert and oriented x 3.  Grossly intact.  Psych: Alert and cooperative, normal mood and affect.   Imaging Studies: No results found.  Assessment and Plan:   Brenda Ellison is a 62 y.o. y/o female returns for follow-up of anemia and abdominal swelling.  Recent labs showed no evidence of anemia.  Normal iron, B12, and folate.  Recent EGD and colonoscopy were normal.  Suspect abdominal swelling is due to obesity.  Weight loss strategies discussed.  Recommend 10-year repeat screening colonoscopy.  Reassurance.    Celso Amy, PA-C  Follow up ***  BP check ***

## 2022-10-19 ENCOUNTER — Encounter: Payer: Self-pay | Admitting: Physician Assistant

## 2022-10-19 ENCOUNTER — Ambulatory Visit (INDEPENDENT_AMBULATORY_CARE_PROVIDER_SITE_OTHER): Payer: Medicaid Other | Admitting: Physician Assistant

## 2022-10-19 VITALS — BP 102/65 | HR 87 | Temp 97.7°F | Ht 64.0 in | Wt 181.0 lb

## 2022-10-19 DIAGNOSIS — R1907 Generalized intra-abdominal and pelvic swelling, mass and lump: Secondary | ICD-10-CM | POA: Diagnosis not present

## 2022-10-19 DIAGNOSIS — R5383 Other fatigue: Secondary | ICD-10-CM

## 2022-10-20 LAB — TSH+FREE T4
Free T4: 1.01 ng/dL (ref 0.82–1.77)
TSH: 0.65 u[IU]/mL (ref 0.450–4.500)

## 2022-12-11 ENCOUNTER — Encounter: Payer: Self-pay | Admitting: Family Medicine

## 2022-12-11 ENCOUNTER — Ambulatory Visit: Payer: Medicaid Other | Admitting: Family Medicine

## 2022-12-11 ENCOUNTER — Other Ambulatory Visit (HOSPITAL_COMMUNITY)
Admission: RE | Admit: 2022-12-11 | Discharge: 2022-12-11 | Disposition: A | Discharge status: Home / Self Care | Payer: Medicaid Other | Source: Ambulatory Visit | Attending: Family Medicine | Admitting: Family Medicine

## 2022-12-11 ENCOUNTER — Ambulatory Visit: Payer: Self-pay

## 2022-12-11 VITALS — BP 136/74 | HR 93 | Temp 96.9°F | Resp 16 | Ht 64.0 in | Wt 176.3 lb

## 2022-12-11 DIAGNOSIS — R300 Dysuria: Secondary | ICD-10-CM | POA: Diagnosis not present

## 2022-12-11 DIAGNOSIS — R21 Rash and other nonspecific skin eruption: Secondary | ICD-10-CM

## 2022-12-11 DIAGNOSIS — N898 Other specified noninflammatory disorders of vagina: Secondary | ICD-10-CM | POA: Insufficient documentation

## 2022-12-11 LAB — POCT URINALYSIS DIPSTICK
Bilirubin, UA: NEGATIVE
Blood, UA: NEGATIVE
Glucose, UA: NEGATIVE
Ketones, UA: NEGATIVE
Leukocytes, UA: NEGATIVE
Nitrite, UA: NEGATIVE
Protein, UA: POSITIVE — AB
Spec Grav, UA: 1.025 (ref 1.010–1.025)
Urobilinogen, UA: 0.2 U/dL
pH, UA: 5 (ref 5.0–8.0)

## 2022-12-11 NOTE — Telephone Encounter (Signed)
  Chief Complaint: Rash on upper thigh, vaginal discharge Symptoms: above Frequency: Saturday night Pertinent Negatives: Patient denies  Disposition: [] ED /[] Urgent Care (no appt availability in office) / [x] Appointment(In office/virtual)/ []  [place] [facility]/ [] Home Care/ [] Refused Recommended Disposition /[] [place] [facility]/ []  Follow-up with PCP Additional Notes: Pt states that she had itching on her upper thigh starting Saturday night. Pt has a rash. She also states she has a vaginal yeast infection. Pt thought that she may also has a UTI.  Appt in office for this afternoon.  Summary: rash/medication request   Patient is experiencing itchy dark red rash on her right thigh. She is wanting to see if provider can call in some type of creme. Please f/u with patient     Reason for Disposition  [1] MODERATE-SEVERE local itching (i.e., interferes with work, school, activities) AND [2] not improved after 24 hours of hydrocortisone cream  Answer Assessment - Initial Assessment Questions 1. DESCRIPTION: "Describe the itching you are having." "Where is it located?"     Right thigh - red rash 2. SEVERITY: "How bad is it?"    - MILD: Doesn't interfere with normal activities.   - MODERATE-SEVERE: Interferes with work, school, sleep, or other activities.      moderate 3. SCRATCHING: "Are there any scratch marks? Bleeding?"     yes 4. ONSET: "When did the itching begin?"      Saturday night 5. CAUSE: "What do you think is causing the itching?"      no 6. OTHER SYMPTOMS: "Do you have any other symptoms?"      no  Protocols used: Itching - Localized-A-AH

## 2022-12-11 NOTE — Patient Instructions (Addendum)
We will call you Wednesday or Thursday and let you know what the vaginal swab and urinary testing results show and let you know what treatments are needed.   You can use hydrocortosone cream to your right thigh itchy rash to help with itching.

## 2022-12-11 NOTE — Progress Notes (Unsigned)
Patient ID: Brenda Ellison, female    DOB: Apr 22, 1960, 62 y.o.   MRN: [id number]  PCP: Danelle Berry, PA-C  Chief Complaint  Patient presents with   Rash    On left thigh   Vaginal Discharge    White, sx onset for weeks pt unsure when they started. Pt denies urine sx   vaginal odor    Subjective:   Brenda Ellison is a 62 y.o. female, presents to clinic with CC of the following:  HPI  Rash to right thigh - three spots very itchy she noticed the other day   She has maladorous vaginal discharge (white) and some discomfort with urination going on for 3-4 weeks.  No recent med changes, Abx or sexual activity (married not sexually active with spouse) No vaginal itching or burning when she pees, no abd pain   Results for orders placed or performed in visit on 12/11/22  POCT Urinalysis Dipstick  Result Value Ref Range   Color, UA Yellow    Clarity, UA Cloudy    Glucose, UA Negative Negative   Bilirubin, UA Negative    Ketones, UA Negative    Spec Grav, UA 1.025 1.010 - 1.025   Blood, UA Negative    pH, UA 5.0 5.0 - 8.0   Protein, UA Positive (A) Negative   Urobilinogen, UA 0.2 0.2 or 1.0 E.U./dL   Nitrite, UA Negative    Leukocytes, UA Negative Negative   Appearance Hazy    Odor Foul      Patient Active Problem List   Diagnosis Date Noted   Anemia 09/20/2022   Type 2 diabetes mellitus without complication, without long-term current use of insulin (HCC) 06/30/2021   Sarcoidosis of lung (HCC) 06/06/2021   Pelvic pain 11/29/2020   Gastroesophageal reflux disease 03/18/2020   New onset type 2 diabetes mellitus (HCC) 02/19/2020   Allergic rhinitis 02/17/2020   Thoracic aortic aneurysm without rupture (HCC) 09/23/2019   Small airways disease 05/13/2019   Class 1 obesity with serious comorbidity and body mass index (BMI) of 31.0 to 31.9 in adult 05/13/2019   Family history of early CAD 05/13/2019   Lung nodule 04/01/2019   Wheeze 04/01/2019   Exertional shortness of  breath 04/01/2019   Mediastinal lymphadenopathy 04/01/2019   Hilar lymphadenopathy 04/01/2019   Cervical radiculopathy 10/25/2018   Stable angina pectoris (HCC) 09/30/2018   Overweight (BMI 25.0-29.9) 04/23/2018   DDD (degenerative disc disease), lumbar 11/06/2017   Cardiac murmur 03/16/2017   Lumbar radiculopathy 03/16/2017   Osteoarthritis 06/07/2015   Hyperlipidemia 06/01/2015   Seasonal allergies 01/12/2015   Urticaria 11/30/2014   Degenerative arthritis of hip 10/19/2014   Arthritis of hand, degenerative 10/05/2014   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   Moderate mitral insufficiency 05/01/2014      Current Outpatient Medications:    albuterol (VENTOLIN HFA) 108 (90 Base) MCG/ACT inhaler, Inhale 2 puffs into the lungs every 4 (four) hours as needed for wheezing or shortness of breath., Disp: 18 g, Rfl: 3   albuterol (VENTOLIN HFA) 108 (90 Base) MCG/ACT inhaler, , Disp: , Rfl:    atorvastatin (LIPITOR) 20 MG tablet, TAKE 1 TABLET BY MOUTH AT BEDTIME, Disp: 30 tablet, Rfl: 8   benzonatate (TESSALON) 100 MG capsule, TAKE 1 TO 2 CAPSULES BY MOUTH THREE TIMES DAILY AS NEEDED FOR COUGH, Disp: 30 capsule, Rfl: 0   blood glucose meter kit and supplies, Dispense based on patient and insurance preference. Use up to four times daily as directed. (FOR  ICD-10 E10.9, E11.9)., Disp: 1 each, Rfl: 0   budesonide-formoterol (SYMBICORT) 160-4.5 MCG/ACT inhaler, Inhale 2 puffs into the lungs 2 (two) times daily., Disp: 1 each, Rfl: 0   celecoxib (CELEBREX) 100 MG capsule, Take 1 capsule by mouth 2 (two) times daily., Disp: , Rfl:    cetirizine (ZYRTEC ALLERGY) 10 MG tablet, 1 tablet Orally Once a day for 30 day(s), Disp: , Rfl:    Cholecalciferol (VITAMIN D3) 2000 UNITS capsule, Take 2,000 Units by mouth daily., Disp: , Rfl:    EQ ALL DAY ALLERGY RELIEF 10 MG tablet, Take 1 tablet by mouth once daily, Disp: 90 tablet, Rfl: 0   fluticasone (FLONASE) 50 MCG/ACT nasal spray, Place 2 sprays into both nostrils  daily., Disp: 16 g, Rfl: 6   gabapentin (NEURONTIN) 100 MG capsule, Take 100 mg by mouth 2 (two) times daily., Disp: , Rfl:    levocetirizine (XYZAL) 5 MG tablet, Take 1 tablet (5 mg total) by mouth every evening., Disp: 90 tablet, Rfl: 1   meclizine (ANTIVERT) 12.5 MG tablet, Take 1 tablet (12.5 mg total) by mouth 3 (three) times daily as needed for dizziness., Disp: 30 tablet, Rfl: 0   metFORMIN (GLUCOPHAGE-XR) 500 MG 24 hr tablet, Take 1 tablet (500 mg total) by mouth 2 (two) times daily with a meal., Disp: 90 tablet, Rfl: 1   montelukast (SINGULAIR) 10 MG tablet, Take 1 tablet (10 mg total) by mouth at bedtime., Disp: 30 tablet, Rfl: 3   pantoprazole (PROTONIX) 20 MG tablet, Take 1 tablet (20 mg total) by mouth 2 (two) times daily. For stomach protection with NSAIDs and GERD, Disp: 60 tablet, Rfl: 1   promethazine-dextromethorphan (PROMETHAZINE-DM) 6.25-15 MG/5ML syrup, Take 2.5-5 mLs by mouth 3 (three) times daily as needed for cough., Disp: 118 mL, Rfl: 0   tiZANidine (ZANAFLEX) 4 MG tablet, Take 1 tablet (4 mg total) by mouth every 8 (eight) hours as needed for muscle spasms (muscle tightness/spasms)., Disp: 30 tablet, Rfl: 1   Allergies  Allergen Reactions   Tramadol Rash   Tramadol Hcl Rash     Social History   Tobacco Use   Smoking status: Former    Current packs/day: 0.00    Average packs/day: 1 pack/day for 22.0 years (22.0 ttl pk-yrs)    Types: Cigarettes    Start date: 02/21/1988    Quit date: 02/20/2010    Years since quitting: 12.8   Smokeless tobacco: Never  Vaping Use   Vaping status: Never Used  Substance Use Topics   Alcohol use: No   Drug use: No      Chart Review Today: I personally reviewed active problem list, medication list, allergies, family history, social history, health maintenance, notes from last encounter, lab results, imaging with the patient/caregiver today.   Review of Systems  Constitutional: Negative.   HENT: Negative.    Eyes: Negative.    Respiratory: Negative.    Cardiovascular: Negative.   Gastrointestinal: Negative.   Endocrine: Negative.   Genitourinary: Negative.   Musculoskeletal: Negative.   Skin: Negative.   Allergic/Immunologic: Negative.   Neurological: Negative.   Hematological: Negative.   Psychiatric/Behavioral: Negative.    All other systems reviewed and are negative.      Objective:   Vitals:   12/11/22 1535  BP: 136/74  Pulse: 93  Resp: 16  Temp: (!) 96.9 F (36.1 C)  TempSrc: Temporal  SpO2: 97%  Weight: 176 lb 4.8 oz (80 kg)  Height: 5\' 4"  (1.626 m)  Body mass index is 30.26 kg/m.  Physical Exam Vitals and nursing note reviewed.  Constitutional:      General: She is not in acute distress.    Appearance: Normal appearance. She is well-developed. She is obese. She is not ill-appearing, toxic-appearing or diaphoretic.  HENT:     Head: Normocephalic and atraumatic.     Nose: Nose normal.  Eyes:     General:        Right eye: No discharge.        Left eye: No discharge.     Conjunctiva/sclera: Conjunctivae normal.  Neck:     Trachea: No tracheal deviation.  Cardiovascular:     Rate and Rhythm: Normal rate and regular rhythm.  Pulmonary:     Effort: Pulmonary effort is normal. No respiratory distress.     Breath sounds: No stridor.  Abdominal:     General: Bowel sounds are normal. There is no distension.     Palpations: Abdomen is soft.     Tenderness: There is no abdominal tenderness. There is no right CVA tenderness, left CVA tenderness, guarding or rebound.  Skin:    General: Skin is warm and dry.     Findings: Rash present. Erythema: three erythematous papules to left upper thigh. Neurological:     Mental Status: She is alert.     Motor: No abnormal muscle tone.     Coordination: Coordination normal.  Psychiatric:        Behavior: Behavior normal.     Results for orders placed or performed in visit on 10/19/22  TSH + free T4  Result Value Ref Range   TSH 0.650  0.450 - 4.500 uIU/mL   Free T4 1.01 0.82 - 1.77 ng/dL       Assessment & Plan:   1. Vaginal discharge Recurrent, pt low risk for STD (married not sexually active) sx mild, no severe irritation, will wait for swab results May also be atrophic vaginitis/genitourinary syndrome of menopause - Cervicovaginal ancillary only - POCT Urinalysis Dipstick - CULTURE, URINE COMPREHENSIVE  2. Rash Rash to thigh, woke up with a few itchy bumps, not spreading, does not appear fungal - likely got bit by something.   No signs of infection.  She can use topical hydrocortisone and oral 2nd gen antihistamines  3. Dysuria Vague current urinary complaints - POCT Urinalysis Dipstick Dip unremarkable No abd ttp or flank pain, will wait for culture - CULTURE, URINE COMPREHENSIVE      Danelle Berry, PA-C 12/11/22 3:51 PM

## 2022-12-13 LAB — CERVICOVAGINAL ANCILLARY ONLY
Bacterial Vaginitis (gardnerella): NEGATIVE
Candida Glabrata: NEGATIVE
Candida Vaginitis: NEGATIVE
Chlamydia: NEGATIVE
Comment: NEGATIVE
Comment: NEGATIVE
Comment: NEGATIVE
Comment: NEGATIVE
Comment: NEGATIVE
Comment: NORMAL
Neisseria Gonorrhea: NEGATIVE
Trichomonas: NEGATIVE

## 2022-12-13 LAB — CULTURE, URINE COMPREHENSIVE
MICRO NUMBER:: 15625043
SPECIMEN QUALITY:: ADEQUATE

## 2022-12-14 ENCOUNTER — Telehealth: Payer: Self-pay

## 2022-12-14 NOTE — Telephone Encounter (Signed)
Pt given lab results per notes of Leisa on 12/14/22. Pt verbalized understanding.   Danelle Berry, PA-C 12/14/2022  1:38 PM EDT     Vaginal swab and urine culture were negative- no need for antibiotics   If she has recurrent symptoms I recommend she do a f/up visit to discuss other treatment options

## 2022-12-25 ENCOUNTER — Other Ambulatory Visit: Payer: Self-pay | Admitting: Family Medicine

## 2022-12-25 DIAGNOSIS — E782 Mixed hyperlipidemia: Secondary | ICD-10-CM

## 2022-12-25 DIAGNOSIS — E119 Type 2 diabetes mellitus without complications: Secondary | ICD-10-CM

## 2022-12-27 DIAGNOSIS — H5203 Hypermetropia, bilateral: Secondary | ICD-10-CM | POA: Diagnosis not present

## 2022-12-27 DIAGNOSIS — H2513 Age-related nuclear cataract, bilateral: Secondary | ICD-10-CM | POA: Diagnosis not present

## 2022-12-27 DIAGNOSIS — E119 Type 2 diabetes mellitus without complications: Secondary | ICD-10-CM | POA: Diagnosis not present

## 2022-12-29 ENCOUNTER — Ambulatory Visit: Payer: Medicaid Other | Admitting: Family Medicine

## 2023-01-01 ENCOUNTER — Ambulatory Visit: Payer: Medicaid Other | Admitting: Physician Assistant

## 2023-01-02 ENCOUNTER — Ambulatory Visit: Payer: Medicaid Other | Admitting: Physician Assistant

## 2023-01-05 ENCOUNTER — Ambulatory Visit: Payer: Medicaid Other | Admitting: Physician Assistant

## 2023-02-28 ENCOUNTER — Other Ambulatory Visit: Payer: Self-pay | Admitting: Family Medicine

## 2023-02-28 DIAGNOSIS — E119 Type 2 diabetes mellitus without complications: Secondary | ICD-10-CM

## 2023-02-28 NOTE — Telephone Encounter (Signed)
 Requested Prescriptions  Pending Prescriptions Disp Refills   metFORMIN  (GLUCOPHAGE -XR) 500 MG 24 hr tablet [Pharmacy Med Name: metFORMIN  HCl ER 500 MG Oral Tablet Extended Release 24 Hour] 180 tablet 0    Sig: TAKE 1 TABLET BY MOUTH TWICE DAILY WITH A MEAL     Endocrinology:  Diabetes - Biguanides Failed - 02/28/2023 11:05 AM      Failed - HBA1C is between 0 and 7.9 and within 180 days    Hgb A1c MFr Bld  Date Value Ref Range Status  06/26/2022 6.5 (H) <5.7 % of total Hgb Final    Comment:    For someone without known diabetes, a hemoglobin A1c value of 6.5% or greater indicates that they may have  diabetes and this should be confirmed with a follow-up  test. . For someone with known diabetes, a value <7% indicates  that their diabetes is well controlled and a value  greater than or equal to 7% indicates suboptimal  control. A1c targets should be individualized based on  duration of diabetes, age, comorbid conditions, and  other considerations. . Currently, no consensus exists regarding use of hemoglobin A1c for diagnosis of diabetes for children. .          Passed - Cr in normal range and within 360 days    Creat  Date Value Ref Range Status  06/26/2022 0.88 0.50 - 1.05 mg/dL Final   Creatinine, Urine  Date Value Ref Range Status  06/26/2022 104 20 - 275 mg/dL Final         Passed - eGFR in normal range and within 360 days    GFR, Est African American  Date Value Ref Range Status  02/17/2020 85 > OR = 60 mL/min/1.63m2 Final   GFR, Est Non African American  Date Value Ref Range Status  02/17/2020 73 > OR = 60 mL/min/1.69m2 Final   eGFR  Date Value Ref Range Status  06/26/2022 75 > OR = 60 mL/min/1.2m2 Final         Passed - B12 Level in normal range and within 720 days    Vitamin B-12  Date Value Ref Range Status  07/24/2022 642 232 - 1,245 pg/mL Final         Passed - Valid encounter within last 6 months    Recent Outpatient Visits           2 months  ago Vaginal discharge   [facility] Leavy Mole, PA-C   5 months ago Type 2 diabetes mellitus without complication, without long-term current use of insulin [facility])   [facility], Gomer, PA-C   5 months ago Vaginal odor   [facility] [place], Mole, PA-C   8 months ago Type 2 diabetes mellitus without complication, without long-term current use of insulin [facility])   [facility] Leavy Mole, PA-C   11 months ago Type 2 diabetes mellitus without complication, without long-term current use of insulin [facility])   [facility] Bernardo Fend, [place]              Passed - CBC within normal limits and completed in the last 12 months    WBC  Date Value Ref Range Status  07/24/2022 7.0 3.4 - 10.8 x10E3/uL Final  06/26/2022 5.7 3.8 - 10.8 Thousand/uL Final   RBC  Date Value Ref Range Status  07/24/2022 4.10 3.77 - 5.28 x10E6/uL Final  06/26/2022 4.01 3.80 -  5.10 Million/uL Final   Hemoglobin  Date Value Ref Range Status  07/24/2022 12.2 11.1 - 15.9 g/dL Final   Hematocrit  Date Value Ref Range Status  07/24/2022 36.6 34.0 - 46.6 % Final   MCHC  Date Value Ref Range Status  07/24/2022 33.3 31.5 - 35.7 g/dL Final  94/93/7975 66.9 32.0 - 36.0 g/dL Final   [facility]  Date Value Ref Range Status  07/24/2022 29.8 26.6 - 33.0 pg Final  06/26/2022 28.7 27.0 - 33.0 pg Final   MCV  Date Value Ref Range Status  07/24/2022 89 79 - 97 fL Final  06/17/2013 89 80 - 100 fL Final   No results found for: PLTCOUNTKUC, LABPLAT, POCPLA RDW  Date Value Ref Range Status  07/24/2022 13.1 11.7 - 15.4 % Final  06/17/2013 13.1 11.5 - 14.5 % Final

## 2023-03-09 ENCOUNTER — Ambulatory Visit (INDEPENDENT_AMBULATORY_CARE_PROVIDER_SITE_OTHER): Payer: Medicaid Other | Admitting: Family Medicine

## 2023-03-09 ENCOUNTER — Other Ambulatory Visit (HOSPITAL_COMMUNITY)
Admission: RE | Admit: 2023-03-09 | Discharge: 2023-03-09 | Disposition: A | Discharge status: Home / Self Care | Payer: Medicaid Other | Source: Ambulatory Visit | Attending: Family Medicine | Admitting: Family Medicine

## 2023-03-09 ENCOUNTER — Encounter: Payer: Self-pay | Admitting: Family Medicine

## 2023-03-09 VITALS — BP 128/70 | HR 74 | Resp 16 | Ht 64.0 in | Wt 182.0 lb

## 2023-03-09 DIAGNOSIS — N760 Acute vaginitis: Secondary | ICD-10-CM | POA: Insufficient documentation

## 2023-03-09 DIAGNOSIS — R102 Pelvic and perineal pain: Secondary | ICD-10-CM

## 2023-03-09 DIAGNOSIS — N838 Other noninflammatory disorders of ovary, fallopian tube and broad ligament: Secondary | ICD-10-CM

## 2023-03-09 MED ORDER — ESTRADIOL 0.1 MG/GM VA CREA
TOPICAL_CREAM | VAGINAL | 12 refills | Status: DC
Start: 2023-03-09 — End: 2023-07-26

## 2023-03-09 NOTE — Progress Notes (Signed)
Patient ID: Brenda Ellison, female    DOB: 1961/02/11, 63 y.o.   MRN: [id number]  PCP: Danelle Berry, PA-C  Chief Complaint  Patient presents with   Referral    Gynecology. Concerned w/constant odor.    Subjective:   Brenda Ellison is a 63 y.o. female, presents to clinic with CC of the following:  HPI  Recurrent vaginal sx and irritation with unremarkable repeated testing, she presents today again for similar - discharge with odor Husband recently passed away, they were not sexually active No pelvic pain, rash, sore/lesions No urinary sx   Patient Active Problem List   Diagnosis Date Noted   Anemia 09/20/2022   Type 2 diabetes mellitus without complication, without long-term current use of insulin (HCC) 06/30/2021   Sarcoidosis of lung (HCC) 06/06/2021   Pelvic pain 11/29/2020   Gastroesophageal reflux disease 03/18/2020   New onset type 2 diabetes mellitus (HCC) 02/19/2020   Allergic rhinitis 02/17/2020   Thoracic aortic aneurysm without rupture (HCC) 09/23/2019   Small airways disease 05/13/2019   Class 1 obesity with serious comorbidity and body mass index (BMI) of 31.0 to 31.9 in adult 05/13/2019   Family history of early CAD 05/13/2019   Lung nodule 04/01/2019   Wheeze 04/01/2019   Exertional shortness of breath 04/01/2019   Mediastinal lymphadenopathy 04/01/2019   Hilar lymphadenopathy 04/01/2019   Cervical radiculopathy 10/25/2018   Stable angina pectoris (HCC) 09/30/2018   Overweight (BMI 25.0-29.9) 04/23/2018   DDD (degenerative disc disease), lumbar 11/06/2017   Cardiac murmur 03/16/2017   Lumbar radiculopathy 03/16/2017   Osteoarthritis 06/07/2015   Hyperlipidemia 06/01/2015   Seasonal allergies 01/12/2015   Urticaria 11/30/2014   Degenerative arthritis of hip 10/19/2014   Arthritis of hand, degenerative 10/05/2014   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   Moderate mitral insufficiency 05/01/2014      Current Outpatient Medications:    albuterol (VENTOLIN  HFA) 108 (90 Base) MCG/ACT inhaler, Inhale 2 puffs into the lungs every 4 (four) hours as needed for wheezing or shortness of breath., Disp: 18 g, Rfl: 3   albuterol (VENTOLIN HFA) 108 (90 Base) MCG/ACT inhaler, , Disp: , Rfl:    atorvastatin (LIPITOR) 20 MG tablet, TAKE 1 TABLET BY MOUTH AT BEDTIME, Disp: 90 tablet, Rfl: 1   blood glucose meter kit and supplies, Dispense based on patient and insurance preference. Use up to four times daily as directed. (FOR ICD-10 E10.9, E11.9)., Disp: 1 each, Rfl: 0   budesonide-formoterol (SYMBICORT) 160-4.5 MCG/ACT inhaler, Inhale 2 puffs into the lungs 2 (two) times daily., Disp: 1 each, Rfl: 0   celecoxib (CELEBREX) 100 MG capsule, Take 1 capsule by mouth 2 (two) times daily., Disp: , Rfl:    cetirizine (ZYRTEC ALLERGY) 10 MG tablet, 1 tablet Orally Once a day for 30 day(s), Disp: , Rfl:    Cholecalciferol (VITAMIN D3) 2000 UNITS capsule, Take 2,000 Units by mouth daily., Disp: , Rfl:    EQ ALL DAY ALLERGY RELIEF 10 MG tablet, Take 1 tablet by mouth once daily, Disp: 90 tablet, Rfl: 0   fluticasone (FLONASE) 50 MCG/ACT nasal spray, Place 2 sprays into both nostrils daily., Disp: 16 g, Rfl: 6   gabapentin (NEURONTIN) 100 MG capsule, Take 100 mg by mouth 2 (two) times daily., Disp: , Rfl:    levocetirizine (XYZAL) 5 MG tablet, Take 1 tablet (5 mg total) by mouth every evening., Disp: 90 tablet, Rfl: 1   meclizine (ANTIVERT) 12.5 MG tablet, Take 1 tablet (12.5 mg total)  by mouth 3 (three) times daily as needed for dizziness., Disp: 30 tablet, Rfl: 0   metFORMIN (GLUCOPHAGE-XR) 500 MG 24 hr tablet, TAKE 1 TABLET BY MOUTH TWICE DAILY WITH A MEAL, Disp: 180 tablet, Rfl: 0   montelukast (SINGULAIR) 10 MG tablet, Take 1 tablet (10 mg total) by mouth at bedtime., Disp: 30 tablet, Rfl: 3   pantoprazole (PROTONIX) 20 MG tablet, Take 1 tablet (20 mg total) by mouth 2 (two) times daily. For stomach protection with NSAIDs and GERD, Disp: 60 tablet, Rfl: 1   tiZANidine  (ZANAFLEX) 4 MG tablet, Take 1 tablet (4 mg total) by mouth every 8 (eight) hours as needed for muscle spasms (muscle tightness/spasms)., Disp: 30 tablet, Rfl: 1   benzonatate (TESSALON) 100 MG capsule, TAKE 1 TO 2 CAPSULES BY MOUTH THREE TIMES DAILY AS NEEDED FOR COUGH (Patient not taking: Reported on 03/09/2023), Disp: 30 capsule, Rfl: 0   promethazine-dextromethorphan (PROMETHAZINE-DM) 6.25-15 MG/5ML syrup, Take 2.5-5 mLs by mouth 3 (three) times daily as needed for cough. (Patient not taking: Reported on 03/09/2023), Disp: 118 mL, Rfl: 0   Allergies  Allergen Reactions   Tramadol Rash   Tramadol Hcl Rash     Social History   Tobacco Use   Smoking status: Former    Current packs/day: 0.00    Average packs/day: 1 pack/day for 22.0 years (22.0 ttl pk-yrs)    Types: Cigarettes    Start date: 02/21/1988    Quit date: 02/20/2010    Years since quitting: 13.0   Smokeless tobacco: Never  Vaping Use   Vaping status: Never Used  Substance Use Topics   Alcohol use: No   Drug use: No      Chart Review Today: I personally reviewed active problem list, medication list, allergies, family history, social history, health maintenance, notes from last encounter, lab results, imaging with the patient/caregiver today.   Review of Systems  Constitutional: Negative.   HENT: Negative.    Eyes: Negative.   Respiratory: Negative.    Cardiovascular: Negative.   Gastrointestinal: Negative.   Endocrine: Negative.   Genitourinary: Negative.   Musculoskeletal: Negative.   Skin: Negative.   Allergic/Immunologic: Negative.   Neurological: Negative.   Hematological: Negative.   Psychiatric/Behavioral: Negative.    All other systems reviewed and are negative.      Objective:   Vitals:   03/09/23 1459  BP: 128/70  Pulse: 74  Resp: 16  SpO2: 98%  Weight: 182 lb (82.6 kg)  Height: 5\' 4"  (1.626 m)    Body mass index is 31.24 kg/m.  Physical Exam Vitals and nursing note reviewed.   Constitutional:      General: She is not in acute distress.    Appearance: Normal appearance. She is well-developed. She is obese. She is not ill-appearing, toxic-appearing or diaphoretic.  HENT:     Head: Normocephalic and atraumatic.     Nose: Nose normal.  Eyes:     General:        Right eye: No discharge.        Left eye: No discharge.     Conjunctiva/sclera: Conjunctivae normal.  Neck:     Trachea: No tracheal deviation.  Cardiovascular:     Rate and Rhythm: Normal rate and regular rhythm.     Pulses: Normal pulses.     Heart sounds: Normal heart sounds.  Pulmonary:     Effort: Pulmonary effort is normal. No respiratory distress.     Breath sounds: Normal breath sounds. No stridor.  Abdominal:     General: Bowel sounds are normal. There is no distension.     Palpations: Abdomen is soft.     Tenderness: There is no abdominal tenderness. There is no right CVA tenderness, left CVA tenderness, guarding or rebound.  Skin:    General: Skin is warm and dry.     Findings: No rash.  Neurological:     Mental Status: She is alert.     Motor: No abnormal muscle tone.     Coordination: Coordination normal.  Psychiatric:        Behavior: Behavior normal.      Results for orders placed or performed in visit on 12/11/22  POCT Urinalysis Dipstick   Collection Time: 12/11/22  3:50 PM  Result Value Ref Range   Color, UA Yellow    Clarity, UA Cloudy    Glucose, UA Negative Negative   Bilirubin, UA Negative    Ketones, UA Negative    Spec Grav, UA 1.025 1.010 - 1.025   Blood, UA Negative    pH, UA 5.0 5.0 - 8.0   Protein, UA Positive (A) Negative   Urobilinogen, UA 0.2 0.2 or 1.0 E.U./dL   Nitrite, UA Negative    Leukocytes, UA Negative Negative   Appearance Hazy    Odor Foul   Cervicovaginal ancillary only   Collection Time: 12/11/22  3:57 PM  Result Value Ref Range   Neisseria Gonorrhea Negative    Chlamydia Negative    Trichomonas Negative    Bacterial Vaginitis  (gardnerella) Negative    Candida Vaginitis Negative    Candida Glabrata Negative    Comment      Normal Reference Range Bacterial Vaginosis - Negative   Comment Normal Reference Ranger Chlamydia - Negative    Comment      Normal Reference Range Neisseria Gonorrhea - Negative   Comment Normal Reference Range Candida Species - Negative    Comment Normal Reference Range Candida Galbrata - Negative    Comment Normal Reference Range Trichomonas - Negative   CULTURE, URINE COMPREHENSIVE   Collection Time: 12/11/22  4:18 PM   Specimen: Urine  Result Value Ref Range   MICRO NUMBER: [id number]    SPECIMEN QUALITY: Adequate    Source OTHER (SPECIFY)    STATUS: FINAL    RESULT:      Mixed genital flora isolated. These superficial bacteria are not indicative of a urinary tract infection. No further organism identification is warranted on this specimen. If clinically indicated, recollect clean-catch, mid-stream urine and transfer  immediately to Urine Culture Transport Tube.        Assessment & Plan:    1. Recurrent vaginitis (Primary) Screen for yeast/BV and tx anything positive Likely atrophic vaginitis, can start vaginal estrogens now and would add additional tx if needed per results - Ambulatory referral to Gynecology - offered and discussed but pt declines today so referral was cancelled - Cervicovaginal ancillary only - estradiol (ESTRACE) 0.1 MG/GM vaginal cream; Place 1 Applicatorful vaginally before bedtime daily x 2 weeks, then do dose 2 to 3 times a week after that for atrophic vaginitis  Dispense: 42.5 g; Refill: 12        Danelle Berry, PA-C 03/09/23 3:22 PM

## 2023-03-13 LAB — CERVICOVAGINAL ANCILLARY ONLY
Bacterial Vaginitis (gardnerella): POSITIVE — AB
Candida Glabrata: NEGATIVE
Candida Vaginitis: POSITIVE — AB
Chlamydia: NEGATIVE
Comment: NEGATIVE
Comment: NEGATIVE
Comment: NEGATIVE
Comment: NEGATIVE
Comment: NEGATIVE
Comment: NORMAL
Neisseria Gonorrhea: NEGATIVE
Trichomonas: NEGATIVE

## 2023-03-14 ENCOUNTER — Other Ambulatory Visit: Payer: Self-pay | Admitting: Family Medicine

## 2023-03-14 MED ORDER — METRONIDAZOLE 500 MG PO TABS: 500.0000 mg | ORAL_TABLET | Freq: Two times a day (BID) | ORAL | 0 refills | Status: AC

## 2023-03-14 NOTE — Progress Notes (Signed)
Pt was positive for yeast and BV, we treated for yeast, I will send in flagyl for BV, please have her f/up if sx do not resolve

## 2023-03-19 ENCOUNTER — Other Ambulatory Visit: Payer: Self-pay | Admitting: Family Medicine

## 2023-03-19 DIAGNOSIS — Z1231 Encounter for screening mammogram for malignant neoplasm of breast: Secondary | ICD-10-CM

## 2023-03-27 ENCOUNTER — Other Ambulatory Visit: Payer: BLUE CROSS/BLUE SHIELD

## 2023-04-02 ENCOUNTER — Ambulatory Visit (LOCAL_COMMUNITY_HEALTH_CENTER): Payer: BLUE CROSS/BLUE SHIELD

## 2023-04-02 DIAGNOSIS — Z111 Encounter for screening for respiratory tuberculosis: Secondary | ICD-10-CM

## 2023-04-03 ENCOUNTER — Ambulatory Visit
Admission: RE | Admit: 2023-04-03 | Discharge: 2023-04-03 | Disposition: A | Discharge status: Home / Self Care | Payer: Self-pay | Source: Ambulatory Visit | Attending: Family Medicine | Admitting: Family Medicine

## 2023-04-03 DIAGNOSIS — Z1231 Encounter for screening mammogram for malignant neoplasm of breast: Secondary | ICD-10-CM | POA: Insufficient documentation

## 2023-04-05 ENCOUNTER — Ambulatory Visit (LOCAL_COMMUNITY_HEALTH_CENTER): Payer: Medicaid Other

## 2023-04-05 DIAGNOSIS — Z111 Encounter for screening for respiratory tuberculosis: Secondary | ICD-10-CM

## 2023-04-05 LAB — TB SKIN TEST
Induration: 0 mm
TB Skin Test: NEGATIVE

## 2023-04-10 ENCOUNTER — Ambulatory Visit: Payer: Self-pay | Admitting: Family Medicine

## 2023-04-10 ENCOUNTER — Ambulatory Visit: Payer: Self-pay

## 2023-04-10 ENCOUNTER — Encounter: Payer: Self-pay | Admitting: Family Medicine

## 2023-04-10 VITALS — BP 132/86 | HR 78 | Temp 99.0°F | Resp 16 | Ht 64.0 in | Wt 182.8 lb

## 2023-04-10 DIAGNOSIS — G8929 Other chronic pain: Secondary | ICD-10-CM

## 2023-04-10 DIAGNOSIS — M549 Dorsalgia, unspecified: Secondary | ICD-10-CM

## 2023-04-10 DIAGNOSIS — M545 Low back pain, unspecified: Secondary | ICD-10-CM

## 2023-04-10 MED ORDER — TIZANIDINE HCL 4 MG PO TABS
4.0000 mg | ORAL_TABLET | Freq: Three times a day (TID) | ORAL | 0 refills | Status: AC | PRN
Start: 2023-04-10 — End: ?

## 2023-04-10 MED ORDER — CELECOXIB 100 MG PO CAPS
100.0000 mg | ORAL_CAPSULE | Freq: Two times a day (BID) | ORAL | 0 refills | Status: DC
Start: 2023-04-10 — End: 2023-07-09

## 2023-04-10 NOTE — Telephone Encounter (Addendum)
Message from [place] C sent at 04/10/2023  1:22 PM EST  Summary: back and stomach discomfort   The patient would like to speak with a member of clinical staff about back discomfort that they began to experience this morning around 10 AM  The patient shares that they experienced light headedness, nausea and stomach discomfort as well  Please contact when possible         Chief Complaint: right lower back pain  Symptoms: legs weak, hard to walk, episode of dizziness that subsided after drinking water Frequency: pt has a h/o chronic back pain- stated was xray and has scoliosis- no longer under the care of ortho Pertinent Negatives: Patient denies incontinence, current dizziness, shooting pain to legs, pain with urination, frequency of urination Disposition: [] ED /[] Urgent Care (no appt availability in office) / [x] Appointment(In office/virtual)/ []  [place] [facility]/ [] Home Care/ [] Refused Recommended Disposition /[] [facility] [place] [facility]/ []  Follow-up with PCP Additional Notes: appt today in office  Reason for Disposition  High-risk adult (e.g., history of cancer, HIV, or IV drug use)  Answer Assessment - Initial Assessment Questions 1. DESCRIPTION: "Describe your dizziness."     Felt lightheaded this am  2. LIGHTHEADED: "Do you feel lightheaded?" (e.g., somewhat faint, woozy, weak upon standing)     Episode of dizzyness 4. SEVERITY: "How bad is it?"  "Do you feel like you are going to faint?" "Can you stand and walk?"   - MILD: Feels slightly dizzy, but walking normally.   - MODERATE: Feels unsteady when walking, but not falling; interferes with normal activities (e.g., school, work).   - SEVERE: Unable to walk without falling, or requires assistance to walk without falling; feels like passing out now.      No longer has it  5. ONSET:  "When did the dizziness begin?"     10 am  6. AGGRAVATING FACTORS: "Does anything make it worse?" (e.g., standing, change in head position)      Not having it at present 10. OTHER SYMPTOMS: "Do you have any other symptoms?" (e.g., fever, chest pain, vomiting, diarrhea, bleeding)       Cold sweats, right lower back pain  Answer Assessment - Initial Assessment Questions 1. ONSET: "When did the pain begin?"      This am  2. LOCATION: "Where does it hurt?" (upper, mid or lower back)     Right lower back  3. SEVERITY: "How bad is the pain?"  (e.g., Scale 1-10; mild, moderate, or severe)   - MILD (1-3): Doesn't interfere with normal activities.    - MODERATE (4-7): Interferes with normal activities or awakens from sleep.    - SEVERE (8-10): Excruciating pain, unable to do any normal activities.      Lower right severe 4. PATTERN: "Is the pain constant?" (e.g., yes, no; constant, intermittent)      constant 5. RADIATION: "Does the pain shoot into your legs or somewhere else?"     no 6. CAUSE:  "What do you think is causing the back pain?"      scoliosis 7. BACK OVERUSE:  "Any recent lifting of heavy objects, strenuous work or exercise?"     no 8. MEDICINES: "What have you taken so far for the pain?" (e.g., nothing, acetaminophen, NSAIDS)     no 9. NEUROLOGIC SYMPTOMS: "Do you have any weakness, numbness, or problems with bowel/bladder control?"     Hard time walking, feel weak  10. OTHER SYMPTOMS: "Do you have any other symptoms?" (e.g., fever, abdomen  pain, burning with urination, blood in urine)       Difficulty walking, leg weakness, episode dizziness  Protocols used: Dizziness - Lightheadedness-A-AH, Back Pain-A-AH

## 2023-04-10 NOTE — Progress Notes (Signed)
Name: Brenda Ellison   MRN: [id number]    DOB: [birth year]   Date:04/10/2023       Progress Note  Subjective  Chief Complaint  Chief Complaint  Patient presents with   Back Pain    Lower R side started this am   Nausea   Dizziness    Discussed the use of AI scribe software for clinical note transcription with the patient, who gave verbal consent to proceed.  History of Present Illness   Brenda Ellison is a 63 year old female with spinal stenosis who presents with acute right lower back pain.  She experiences acute right lower back pain, described as both achy and sharp, which began suddenly upon waking. The pain is localized to the right lower back, near the spine, without radiation, and is exacerbated by movement, particularly when getting up from a seated position. She rates the pain as 8 to 9 out of 10.  She has a history of spinal stenosis diagnosed in 2020 . She recalls being informed that her spine was bent following an x-ray. She experiences neurogenic claudication, with occasional tiredness and a feeling of heaviness in her legs, but denies current leg pain. No bowel or bladder incontinence is reported.  Her current medications include gabapentin and Celebrex, although she is currently out of both medications that were previously prescribed by Rheumatologist for OA. . She has been using a heating band for pain relief, which provides some benefit.        Patient Active Problem List   Diagnosis Date Noted   Anemia 09/20/2022   Type 2 diabetes mellitus without complication, without long-term current use of insulin (HCC) 06/30/2021   Sarcoidosis of lung (HCC) 06/06/2021   Pelvic pain 11/29/2020   Gastroesophageal reflux disease 03/18/2020   New onset type 2 diabetes mellitus (HCC) 02/19/2020   Allergic rhinitis 02/17/2020   Thoracic aortic aneurysm without rupture (HCC) 09/23/2019   Small airways disease 05/13/2019   Class 1 obesity with serious comorbidity and body mass index  (BMI) of 31.0 to 31.9 in adult 05/13/2019   Family history of early CAD 05/13/2019   Lung nodule 04/01/2019   Wheeze 04/01/2019   Exertional shortness of breath 04/01/2019   Mediastinal lymphadenopathy 04/01/2019   Hilar lymphadenopathy 04/01/2019   Cervical radiculopathy 10/25/2018   Stable angina pectoris (HCC) 09/30/2018   Overweight (BMI 25.0-29.9) 04/23/2018   DDD (degenerative disc disease), lumbar 11/06/2017   Cardiac murmur 03/16/2017   Lumbar radiculopathy 03/16/2017   Osteoarthritis 06/07/2015   Hyperlipidemia 06/01/2015   Seasonal allergies 01/12/2015   Urticaria 11/30/2014   Degenerative arthritis of hip 10/19/2014   Arthritis of hand, degenerative 10/05/2014   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   Moderate mitral insufficiency 05/01/2014    Social History   Tobacco Use   Smoking status: Former    Current packs/day: 0.00    Average packs/day: 1 pack/day for 22.0 years (22.0 ttl pk-yrs)    Types: Cigarettes    Start date: 02/21/1988    Quit date: 02/20/2010    Years since quitting: 13.1   Smokeless tobacco: Never  Substance Use Topics   Alcohol use: No     Current Outpatient Medications:    albuterol (VENTOLIN HFA) 108 (90 Base) MCG/ACT inhaler, Inhale 2 puffs into the lungs every 4 (four) hours as needed for wheezing or shortness of breath., Disp: 18 g, Rfl: 3   albuterol (VENTOLIN HFA) 108 (90 Base) MCG/ACT inhaler, , Disp: , Rfl:    atorvastatin (LIPITOR)  20 MG tablet, TAKE 1 TABLET BY MOUTH AT BEDTIME, Disp: 90 tablet, Rfl: 1   blood glucose meter kit and supplies, Dispense based on patient and insurance preference. Use up to four times daily as directed. (FOR ICD-10 E10.9, E11.9)., Disp: 1 each, Rfl: 0   budesonide-formoterol (SYMBICORT) 160-4.5 MCG/ACT inhaler, Inhale 2 puffs into the lungs 2 (two) times daily., Disp: 1 each, Rfl: 0   celecoxib (CELEBREX) 100 MG capsule, Take 1 capsule by mouth 2 (two) times daily., Disp: , Rfl:    cetirizine (ZYRTEC ALLERGY) 10 MG  tablet, 1 tablet Orally Once a day for 30 day(s), Disp: , Rfl:    Cholecalciferol (VITAMIN D3) 2000 UNITS capsule, Take 2,000 Units by mouth daily., Disp: , Rfl:    EQ ALL DAY ALLERGY RELIEF 10 MG tablet, Take 1 tablet by mouth once daily, Disp: 90 tablet, Rfl: 0   estradiol (ESTRACE) 0.1 MG/GM vaginal cream, Place 1 Applicatorful vaginally before bedtime daily x 2 weeks, then do dose 2 to 3 times a week after that for atrophic vaginitis, Disp: 42.5 g, Rfl: 12   fluticasone (FLONASE) 50 MCG/ACT nasal spray, Place 2 sprays into both nostrils daily., Disp: 16 g, Rfl: 6   gabapentin (NEURONTIN) 100 MG capsule, Take 100 mg by mouth 2 (two) times daily., Disp: , Rfl:    levocetirizine (XYZAL) 5 MG tablet, Take 1 tablet (5 mg total) by mouth every evening., Disp: 90 tablet, Rfl: 1   meclizine (ANTIVERT) 12.5 MG tablet, Take 1 tablet (12.5 mg total) by mouth 3 (three) times daily as needed for dizziness., Disp: 30 tablet, Rfl: 0   metFORMIN (GLUCOPHAGE-XR) 500 MG 24 hr tablet, TAKE 1 TABLET BY MOUTH TWICE DAILY WITH A MEAL, Disp: 180 tablet, Rfl: 0   montelukast (SINGULAIR) 10 MG tablet, Take 1 tablet (10 mg total) by mouth at bedtime., Disp: 30 tablet, Rfl: 3   pantoprazole (PROTONIX) 20 MG tablet, Take 1 tablet (20 mg total) by mouth 2 (two) times daily. For stomach protection with NSAIDs and GERD, Disp: 60 tablet, Rfl: 1   tiZANidine (ZANAFLEX) 4 MG tablet, Take 1 tablet (4 mg total) by mouth every 8 (eight) hours as needed for muscle spasms (muscle tightness/spasms)., Disp: 30 tablet, Rfl: 1   benzonatate (TESSALON) 100 MG capsule, TAKE 1 TO 2 CAPSULES BY MOUTH THREE TIMES DAILY AS NEEDED FOR COUGH (Patient not taking: Reported on 04/10/2023), Disp: 30 capsule, Rfl: 0   promethazine-dextromethorphan (PROMETHAZINE-DM) 6.25-15 MG/5ML syrup, Take 2.5-5 mLs by mouth 3 (three) times daily as needed for cough. (Patient not taking: Reported on 04/10/2023), Disp: 118 mL, Rfl: 0  Allergies  Allergen Reactions    Tramadol Rash   Tramadol Hcl Rash    ROS  Ten systems reviewed and is negative except as mentioned in HPI    Objective  Vitals:   04/10/23 1450  BP: 132/86  Pulse: 78  Resp: 16  Temp: 99 F (37.2 C)  TempSrc: Oral  SpO2: 95%  Weight: 182 lb 12.8 oz (82.9 kg)  Height: 5\' 4"  (1.626 m)    Body mass index is 31.38 kg/m.    Physical Exam  Constitutional: Patient appears well-developed and well-nourished. Obese  No distress.  HEENT: head atraumatic, normocephalic, pupils equal and reactive to light, neck supple Cardiovascular: Normal rate, regular rhythm and normal heart sounds.  No murmur heard. No BLE edema. Pulmonary/Chest: Effort normal and breath sounds normal. No respiratory distress. Abdominal: Soft.  There is no tenderness. Muscular skeletal: tender during  palpation of right lower back, negative straight leg raise, decrease rom on all directions, no rashes Psychiatric: Patient has a normal mood and affect. behavior is normal. Judgment and thought content normal.   Recent Results (from the past 2160 hours)  Cervicovaginal ancillary only     Status: Abnormal   Collection Time: 03/09/23  3:22 PM  Result Value Ref Range   Neisseria Gonorrhea Negative    Chlamydia Negative    Trichomonas Negative    Bacterial Vaginitis (gardnerella) Positive (A)    Candida Vaginitis Positive (A)    Candida Glabrata Negative    Comment      Normal Reference Range Bacterial Vaginosis - Negative   Comment Normal Reference Range Candida Species - Negative    Comment Normal Reference Range Candida Galbrata - Negative    Comment Normal Reference Range Trichomonas - Negative    Comment Normal Reference Ranger Chlamydia - Negative    Comment      Normal Reference Range Neisseria Gonorrhea - Negative  TB Skin Test     Status: Normal   Collection Time: 04/05/23  2:03 PM  Result Value Ref Range   TB Skin Test Negative    Induration 0 mm     Assessment and Plan    Acute on Chronic  Lower Back Pain Patient with a history of spinal stenosis presents with acute right lower back pain. Pain is described as achy and sharp, localized to the right lower back without radiation. No recent changes in activity or trauma. Patient has been out of Celebrex for an unknown duration. -Resume Celebrex as soon as possible. -Add Tizanidine for muscle relaxation. -Advise use of heating pad and ice as needed for pain relief. -Follow up with primary care provider in one month.  Spinal Stenosis Chronic condition and seen by neurosurgeon in the past . No current symptoms of neurogenic claudication. -Continue current management plan.  Type 2 Diabetes Mellitus Last A1c was well controlled. Patient has not had follow-up for diabetes management in almost a year. -Schedule follow-up appointment with primary care provider for diabetes management.  Medication Refills Patient has been out of Celebrex and needs to pick up Metformin and Atorvastatin from the pharmacy. -Refill Celebrex prescription. -Advise patient to pick up Metformin and Atorvastatin from the pharmacy.

## 2023-04-11 NOTE — Telephone Encounter (Signed)
I advised patient per LEISA PA NOTE: Mammogram results reviewed, repeat screening in 1 year, HM tab/chart updated to reflect   PT VERBALIZED UDNERSTANDING  Copied from CRM [id number]. Topic: Clinical - Lab/Test Results >> Apr 11, 2023 12:01 PM Elle L wrote: Reason for CRM: The patient is requesting to go over her mammogram in more depth. Her call back number is [phone].

## 2023-04-24 ENCOUNTER — Other Ambulatory Visit: Payer: Self-pay | Admitting: Family Medicine

## 2023-04-24 DIAGNOSIS — M549 Dorsalgia, unspecified: Secondary | ICD-10-CM

## 2023-04-24 DIAGNOSIS — M545 Low back pain, unspecified: Secondary | ICD-10-CM

## 2023-04-25 ENCOUNTER — Other Ambulatory Visit: Payer: Self-pay | Admitting: Family Medicine

## 2023-04-25 DIAGNOSIS — M549 Dorsalgia, unspecified: Secondary | ICD-10-CM

## 2023-04-25 DIAGNOSIS — M545 Low back pain, unspecified: Secondary | ICD-10-CM

## 2023-04-30 IMAGING — MG MM DIGITAL SCREENING BILAT W/ TOMO AND CAD
8 series · 8 of 24 positions shown · non-contrast
Comparison: Previous exam(s).

CLINICAL DATA: Screening.

EXAM:
DIGITAL SCREENING BILATERAL MAMMOGRAM WITH TOMOSYNTHESIS AND CAD
TECHNIQUE: Bilateral screening digital craniocaudal and mediolateral oblique
mammograms were obtained. Bilateral screening digital breast
tomosynthesis was performed. The images were evaluated with
computer-aided detection.

[R MLO synth-2D]
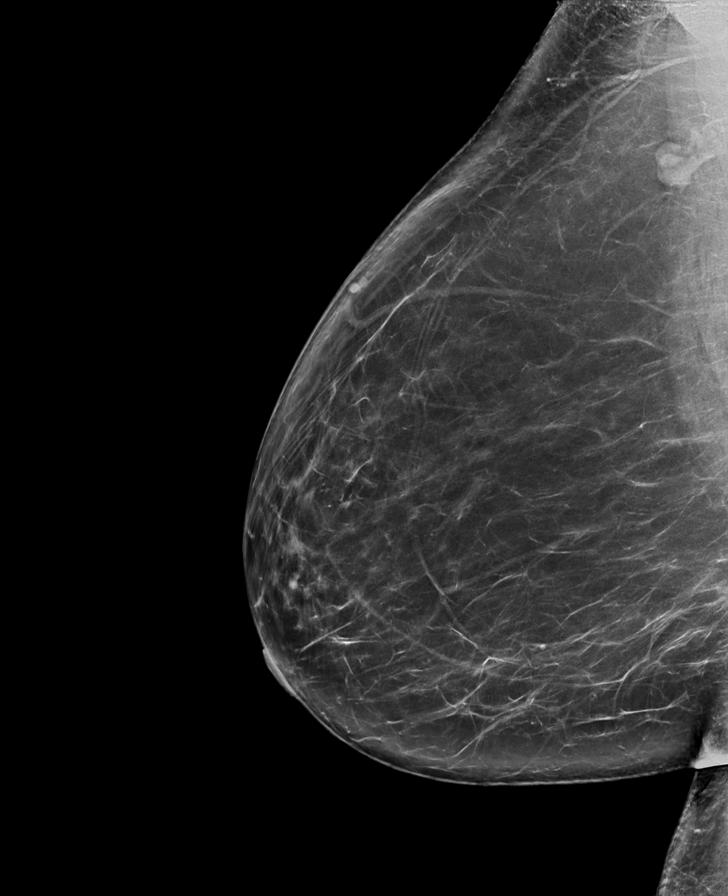

[R CC synth-2D]
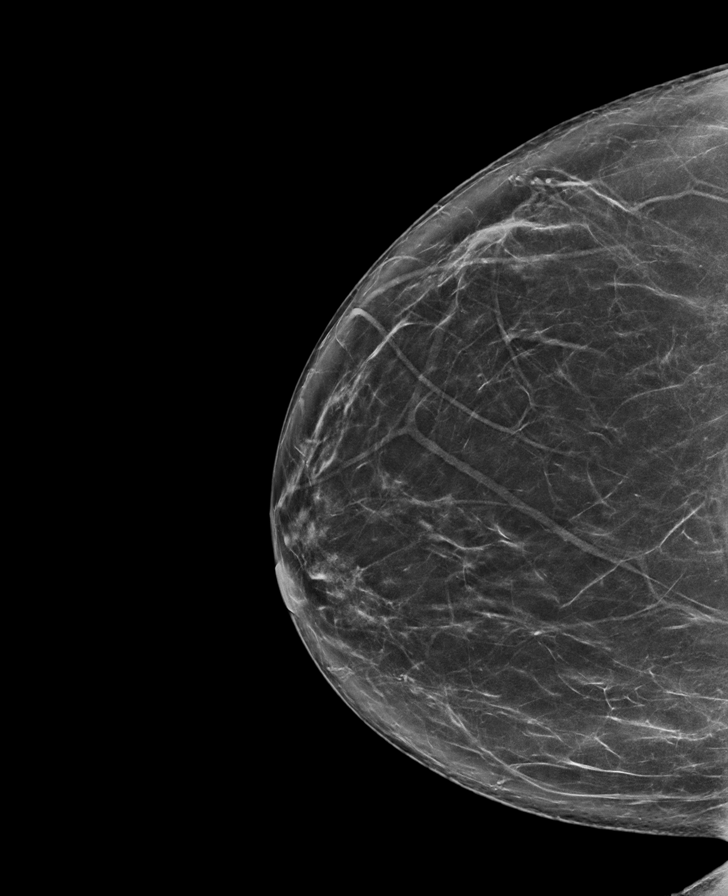

[L CC synth-2D]
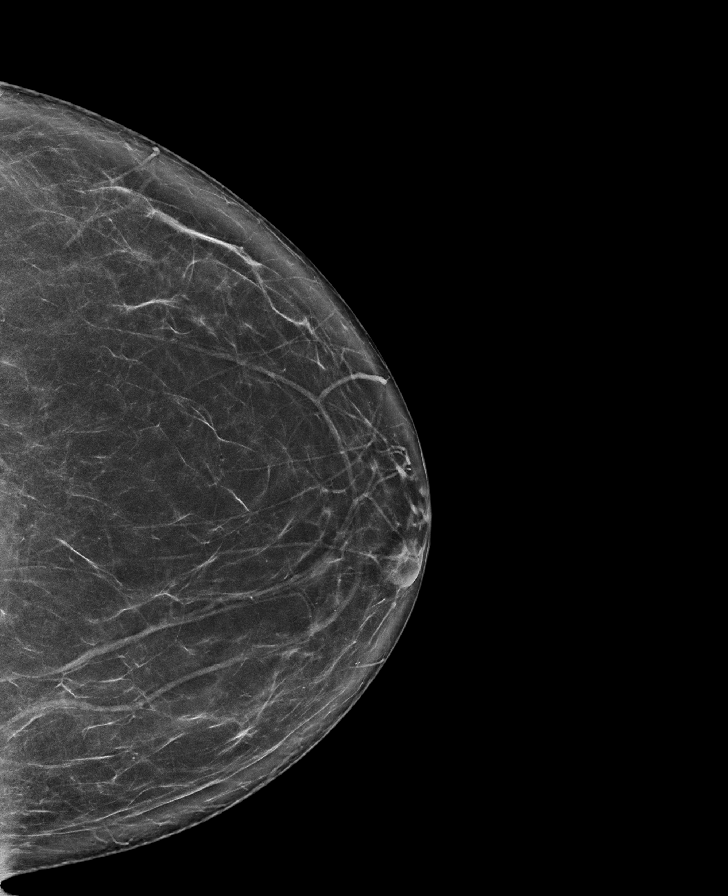

[L MLO synth-2D]
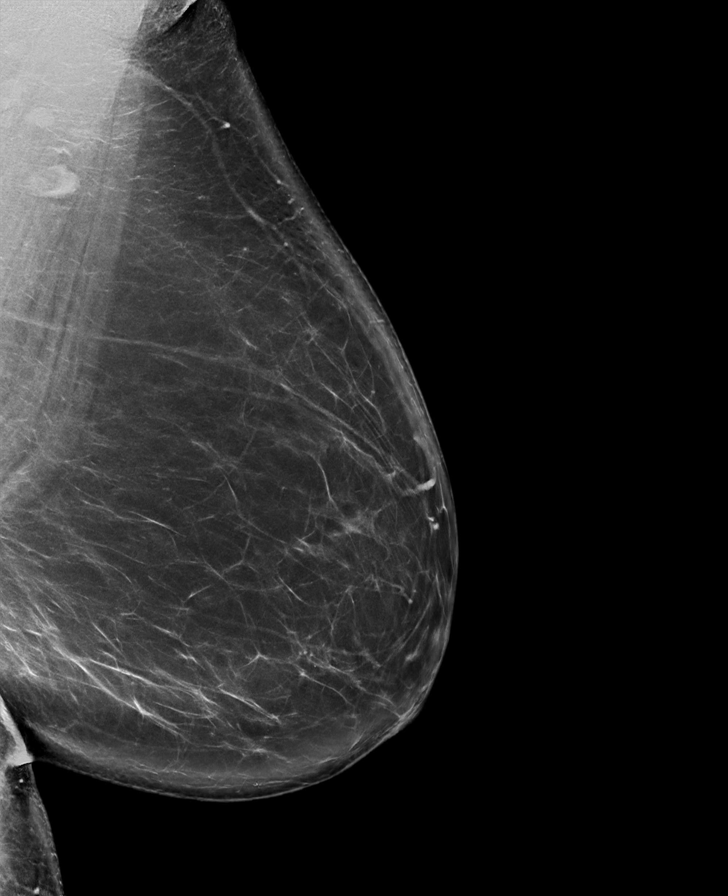

[R MLO tomo · tomo slice 49/97.0]
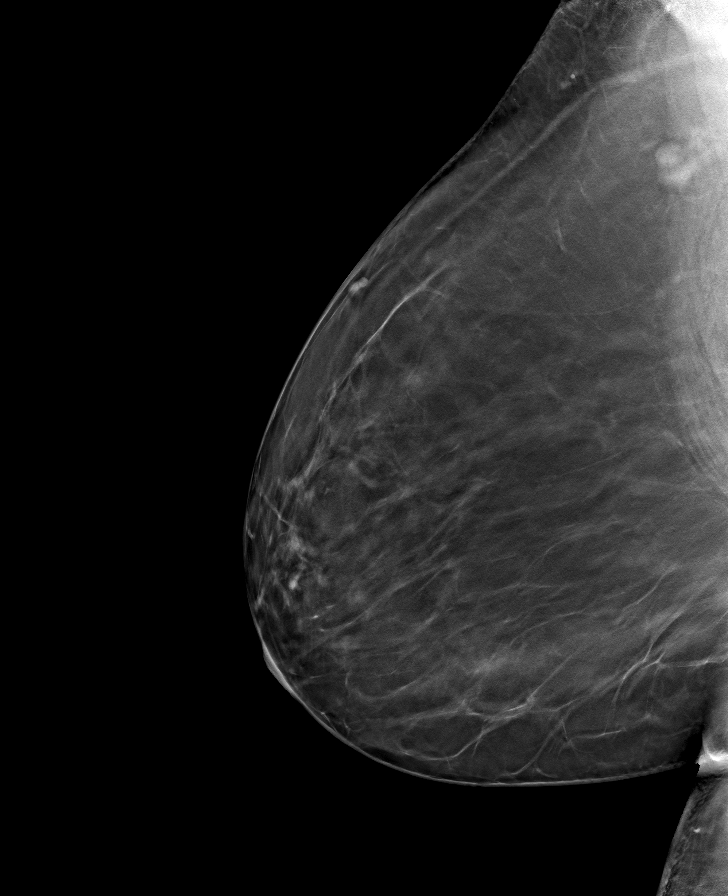

[L MLO tomo · tomo slice 51/101.0]
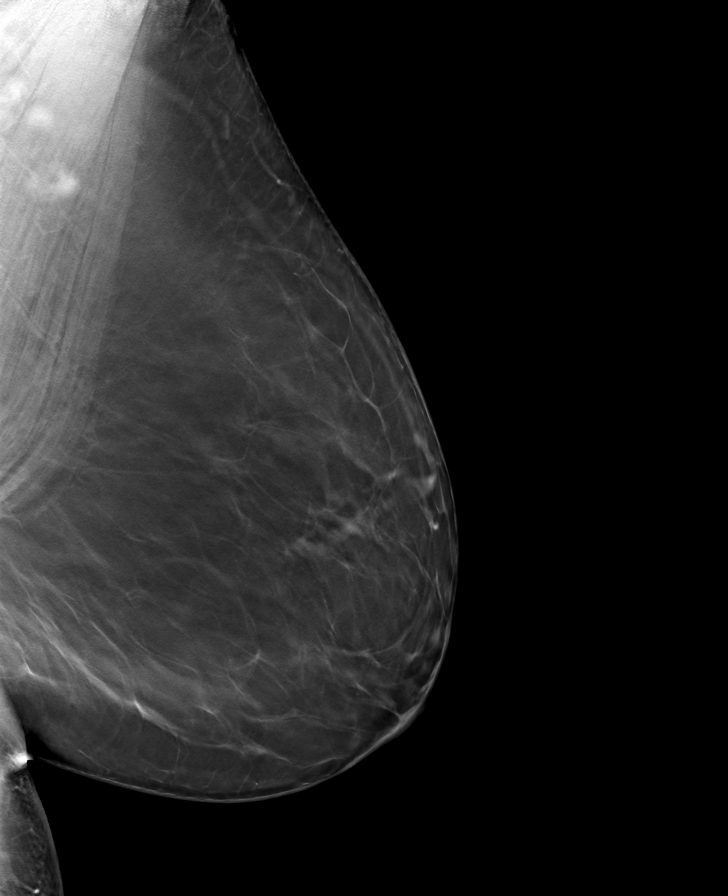

[L CC tomo · tomo slice 41/82.0]
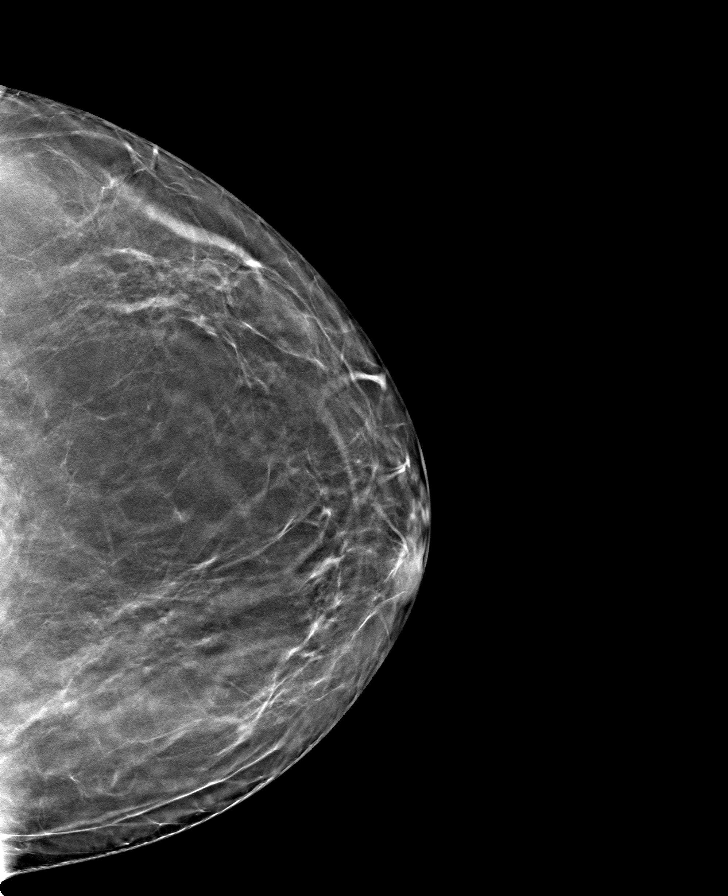

[R CC tomo · tomo slice 41/81.0]
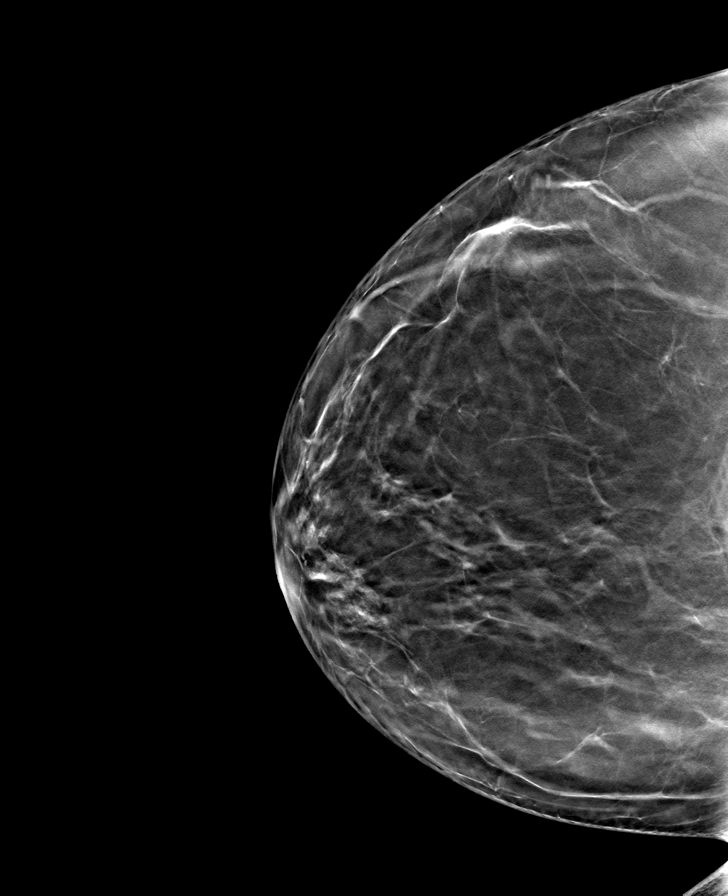

[8 of 24 positions shown; findings below may reference images not displayed]

ACR Breast Density Category b: There are scattered areas of
fibroglandular density.
FINDINGS: There are no findings suspicious for malignancy.
IMPRESSION: No mammographic evidence of malignancy. A result letter of this
screening mammogram will be mailed directly to the patient.

RECOMMENDATION:
Screening mammogram in one year. (Code:51-O-LD2)

BI-RADS CATEGORY  1: Negative.

## 2023-06-15 ENCOUNTER — Other Ambulatory Visit: Payer: Self-pay | Admitting: Family Medicine

## 2023-06-15 DIAGNOSIS — E119 Type 2 diabetes mellitus without complications: Secondary | ICD-10-CM

## 2023-06-18 NOTE — Telephone Encounter (Signed)
 Requested Prescriptions  Pending Prescriptions Disp Refills   metFORMIN  (GLUCOPHAGE -XR) 500 MG 24 hr tablet [Pharmacy Med Name: metFORMIN  HCl ER 500 MG Oral Tablet Extended Release 24 Hour] 180 tablet 0    Sig: TAKE 1 TABLET BY MOUTH TWICE DAILY WITH A MEAL     Endocrinology:  Diabetes - Biguanides Failed - 06/18/2023  9:37 AM      Failed - HBA1C is between 0 and 7.9 and within 180 days    Hgb A1c MFr Bld  Date Value Ref Range Status  06/26/2022 6.5 (H) <5.7 % of total Hgb Final    Comment:    For someone without known diabetes, a hemoglobin A1c value of 6.5% or greater indicates that they may have  diabetes and this should be confirmed with a follow-up  test. . For someone with known diabetes, a value <7% indicates  that their diabetes is well controlled and a value  greater than or equal to 7% indicates suboptimal  control. A1c targets should be individualized based on  duration of diabetes, age, comorbid conditions, and  other considerations. . Currently, no consensus exists regarding use of hemoglobin A1c for diagnosis of diabetes for children. .          Passed - Cr in normal range and within 360 days    Creat  Date Value Ref Range Status  06/26/2022 0.88 0.50 - 1.05 mg/dL Final   Creatinine, Urine  Date Value Ref Range Status  06/26/2022 104 20 - 275 mg/dL Final         Passed - eGFR in normal range and within 360 days    GFR, Est African American  Date Value Ref Range Status  02/17/2020 85 > OR = 60 mL/min/1.47m2 Final   GFR, Est Non African American  Date Value Ref Range Status  02/17/2020 73 > OR = 60 mL/min/1.50m2 Final   eGFR  Date Value Ref Range Status  06/26/2022 75 > OR = 60 mL/min/1.43m2 Final         Passed - B12 Level in normal range and within 720 days    Vitamin B-12  Date Value Ref Range Status  07/24/2022 642 232 - 1,245 pg/mL Final         Passed - Valid encounter within last 6 months    Recent Outpatient Visits           2 months  ago Right low back pain, unspecified chronicity, unspecified whether sciatica present   [facility], Arvis Laura, MD       Future Appointments             In 3 weeks Adeline Hone, PA-C [facility]            Passed - CBC within normal limits and completed in the last 12 months    WBC  Date Value Ref Range Status  07/24/2022 7.0 3.4 - 10.8 x10E3/uL Final  06/26/2022 5.7 3.8 - 10.8 Thousand/uL Final   RBC  Date Value Ref Range Status  07/24/2022 4.10 3.77 - 5.28 x10E6/uL Final  06/26/2022 4.01 3.80 - 5.10 Million/uL Final   Hemoglobin  Date Value Ref Range Status  07/24/2022 12.2 11.1 - 15.9 g/dL Final   Hematocrit  Date Value Ref Range Status  07/24/2022 36.6 34.0 - 46.6 % Final   MCHC  Date Value Ref Range Status  07/24/2022 33.3 31.5 - 35.7 g/dL Final  13/09/6576 46.9 32.0 - 36.0 g/dL Final  [facility]  Date Value Ref Range Status  07/24/2022 29.8 26.6 - 33.0 pg Final  06/26/2022 28.7 27.0 - 33.0 pg Final   MCV  Date Value Ref Range Status  07/24/2022 89 79 - 97 fL Final  06/17/2013 89 80 - 100 fL Final   No results found for: "PLTCOUNTKUC", "LABPLAT", "POCPLA" RDW  Date Value Ref Range Status  07/24/2022 13.1 11.7 - 15.4 % Final  06/17/2013 13.1 11.5 - 14.5 % Final

## 2023-06-22 ENCOUNTER — Other Ambulatory Visit: Payer: Self-pay | Admitting: Family Medicine

## 2023-06-22 DIAGNOSIS — E782 Mixed hyperlipidemia: Secondary | ICD-10-CM

## 2023-06-25 NOTE — Telephone Encounter (Signed)
 Requested medication (s) are due for refill today:   Yes  Requested medication (s) are on the active medication list:   Yes  Future visit scheduled:   Yes.  5/19    LOV 04/10/2023   Last ordered: 12/25/2022 #90, 1 refill  Unable to refill because lipid panel is due   Requested Prescriptions  Pending Prescriptions Disp Refills   atorvastatin  (LIPITOR) 20 MG tablet [Pharmacy Med Name: Atorvastatin  Calcium  20 MG Oral Tablet] 90 tablet 0    Sig: TAKE 1 TABLET BY MOUTH AT BEDTIME     Cardiovascular:  Antilipid - Statins Failed - 06/25/2023  3:43 PM      Failed - Lipid Panel in normal range within the last 12 months    Cholesterol  Date Value Ref Range Status  06/26/2022 128 <200 mg/dL Final   LDL Cholesterol (Calc)  Date Value Ref Range Status  06/26/2022 51 mg/dL (calc) Final    Comment:    Reference range: <100 . Desirable range <100 mg/dL for primary prevention;   <70 mg/dL for patients with CHD or diabetic patients  with > or = 2 CHD risk factors. Aaron Aas LDL-C is now calculated using the Martin-Hopkins  calculation, which is a validated novel method providing  better accuracy than the Friedewald equation in the  estimation of LDL-C.  Melinda Sprawls et al. Erroll Heard. 4010;272(53): 2061-2068  (http://education.[employer].com/faq/FAQ164)    HDL  Date Value Ref Range Status  06/26/2022 58 > OR = 50 mg/dL Final   Triglycerides  Date Value Ref Range Status  06/26/2022 111 <150 mg/dL Final         Passed - Patient is not pregnant      Passed - Valid encounter within last 12 months    Recent Outpatient Visits           2 months ago Right low back pain, unspecified chronicity, unspecified whether sciatica present   [facility] Arleen Lacer, MD       Future Appointments             In 2 weeks Adeline Hone, PA-C [facility], [facility]

## 2023-06-27 LAB — HM DIABETES EYE EXAM

## 2023-07-09 ENCOUNTER — Encounter: Payer: Self-pay | Admitting: Family Medicine

## 2023-07-09 ENCOUNTER — Ambulatory Visit: Payer: Medicaid Other | Admitting: Family Medicine

## 2023-07-09 VITALS — BP 124/72 | HR 74 | Resp 16 | Ht 64.0 in | Wt 187.0 lb

## 2023-07-09 DIAGNOSIS — E119 Type 2 diabetes mellitus without complications: Secondary | ICD-10-CM

## 2023-07-09 DIAGNOSIS — J302 Other seasonal allergic rhinitis: Secondary | ICD-10-CM

## 2023-07-09 DIAGNOSIS — R059 Cough, unspecified: Secondary | ICD-10-CM

## 2023-07-09 DIAGNOSIS — J984 Other disorders of lung: Secondary | ICD-10-CM

## 2023-07-09 DIAGNOSIS — E782 Mixed hyperlipidemia: Secondary | ICD-10-CM

## 2023-07-09 DIAGNOSIS — Z1331 Encounter for screening for depression: Secondary | ICD-10-CM

## 2023-07-09 DIAGNOSIS — D860 Sarcoidosis of lung: Secondary | ICD-10-CM | POA: Diagnosis not present

## 2023-07-09 DIAGNOSIS — Z5181 Encounter for therapeutic drug level monitoring: Secondary | ICD-10-CM

## 2023-07-09 DIAGNOSIS — J329 Chronic sinusitis, unspecified: Secondary | ICD-10-CM

## 2023-07-09 DIAGNOSIS — J3089 Other allergic rhinitis: Secondary | ICD-10-CM

## 2023-07-09 DIAGNOSIS — Z7984 Long term (current) use of oral hypoglycemic drugs: Secondary | ICD-10-CM

## 2023-07-09 DIAGNOSIS — R5383 Other fatigue: Secondary | ICD-10-CM

## 2023-07-09 DIAGNOSIS — R053 Chronic cough: Secondary | ICD-10-CM

## 2023-07-09 DIAGNOSIS — D649 Anemia, unspecified: Secondary | ICD-10-CM

## 2023-07-09 DIAGNOSIS — K219 Gastro-esophageal reflux disease without esophagitis: Secondary | ICD-10-CM | POA: Diagnosis not present

## 2023-07-09 MED ORDER — MONTELUKAST SODIUM 10 MG PO TABS: 10.0000 mg | ORAL_TABLET | Freq: Every day | ORAL | 1 refills | Status: AC

## 2023-07-09 MED ORDER — FLUTICASONE PROPIONATE 50 MCG/ACT NA SUSP: 2.0000 | Freq: Every day | NASAL | 6 refills | Status: AC

## 2023-07-09 MED ORDER — LEVOCETIRIZINE DIHYDROCHLORIDE 5 MG PO TABS: 5.0000 mg | ORAL_TABLET | Freq: Every evening | ORAL | 1 refills | Status: AC

## 2023-07-09 MED ORDER — BUDESONIDE-FORMOTEROL FUMARATE 160-4.5 MCG/ACT IN AERO: 2.0000 | INHALATION_SPRAY | Freq: Two times a day (BID) | RESPIRATORY_TRACT | 1 refills | Status: DC

## 2023-07-09 MED ORDER — PANTOPRAZOLE SODIUM 40 MG PO TBEC: 40.0000 mg | DELAYED_RELEASE_TABLET | Freq: Every day | ORAL | 0 refills | Status: DC | PRN

## 2023-07-09 NOTE — Progress Notes (Unsigned)
 Name: Brenda Ellison   MRN: [id number]    DOB: 04-23-1960   Date:07/09/2023       Progress Note  Chief Complaint  Patient presents with   Medical Management of Chronic Issues     Subjective:   Brenda Ellison is a 63 y.o. female, presents to clinic for routine follow up on chronic conditions  Here for f/up on DM, HLD, anemia lung disease Last routine OV Aug 2024 Seen since for acute issues  Pt comes alone, historically she has poor health literacy and insight, it has been difficult to educate her regarding meds, dx, lifestyle efforts and we have referred her to DM RD/nutrition multiple times. She is having some GI upset Breathing she endorses being more SOB and wheezy - [facility] pulm team has dx her with sarcoidosis then told her she doesn't have sarcoidosis, she is interested in est care locally DM on metformin   HLD on statin - atorvastatin  20  Back pain/DDD/arthritis - on several meds but she's not sure what helped or worked for her  Allergies/pulm on zyrtec, flonase , singulair , albuterol  and symbicort  Hx of GERD - she had improved symptoms in the past with PPI    Current Outpatient Medications:    albuterol  (VENTOLIN  HFA) 108 (90 Base) MCG/ACT inhaler, Inhale 2 puffs into the lungs every 4 (four) hours as needed for wheezing or shortness of breath., Disp: 18 g, Rfl: 3   albuterol  (VENTOLIN  HFA) 108 (90 Base) MCG/ACT inhaler, , Disp: , Rfl:    atorvastatin  (LIPITOR) 20 MG tablet, TAKE 1 TABLET BY MOUTH AT BEDTIME, Disp: 30 tablet, Rfl: 0   blood glucose meter kit and supplies, Dispense based on patient and insurance preference. Use up to four times daily as directed. (FOR ICD-10 E10.9, E11.9)., Disp: 1 each, Rfl: 0   budesonide -formoterol  (SYMBICORT ) 160-4.5 MCG/ACT inhaler, Inhale 2 puffs into the lungs 2 (two) times daily., Disp: 1 each, Rfl: 0   celecoxib  (CELEBREX ) 100 MG capsule, Take 1 capsule (100 mg total) by mouth 2 (two) times daily., Disp: 60 capsule, Rfl: 0   cetirizine  (ZYRTEC ALLERGY) 10 MG tablet, 1 tablet Orally Once a day for 30 day(s), Disp: , Rfl:    Cholecalciferol (VITAMIN D3) 2000 UNITS capsule, Take 2,000 Units by mouth daily., Disp: , Rfl:    EQ ALL DAY ALLERGY RELIEF 10 MG tablet, Take 1 tablet by mouth once daily, Disp: 90 tablet, Rfl: 0   estradiol  (ESTRACE ) 0.1 MG/GM vaginal cream, Place 1 Applicatorful vaginally before bedtime daily x 2 weeks, then do dose 2 to 3 times a week after that for atrophic vaginitis, Disp: 42.5 g, Rfl: 12   fluticasone  (FLONASE ) 50 MCG/ACT nasal spray, Place 2 sprays into both nostrils daily., Disp: 16 g, Rfl: 6   gabapentin (NEURONTIN) 100 MG capsule, Take 100 mg by mouth 2 (two) times daily., Disp: , Rfl:    levocetirizine (XYZAL ) 5 MG tablet, Take 1 tablet (5 mg total) by mouth every evening., Disp: 90 tablet, Rfl: 1   meclizine  (ANTIVERT ) 12.5 MG tablet, Take 1 tablet (12.5 mg total) by mouth 3 (three) times daily as needed for dizziness., Disp: 30 tablet, Rfl: 0   metFORMIN  (GLUCOPHAGE -XR) 500 MG 24 hr tablet, TAKE 1 TABLET BY MOUTH TWICE DAILY WITH A MEAL, Disp: 180 tablet, Rfl: 0   montelukast  (SINGULAIR ) 10 MG tablet, Take 1 tablet (10 mg total) by mouth at bedtime., Disp: 30 tablet, Rfl: 3   pantoprazole  (PROTONIX ) 20 MG tablet, Take 1  tablet (20 mg total) by mouth 2 (two) times daily. For stomach protection with NSAIDs and GERD, Disp: 60 tablet, Rfl: 1   tiZANidine  (ZANAFLEX ) 4 MG tablet, Take 1 tablet (4 mg total) by mouth every 8 (eight) hours as needed for muscle spasms (muscle tightness/spasms)., Disp: 30 tablet, Rfl: 0  Patient Active Problem List   Diagnosis Date Noted   Anemia 09/20/2022   Type 2 diabetes mellitus without complication, without long-term current use of insulin (HCC) 06/30/2021   Sarcoidosis of lung (HCC) 06/06/2021   Pelvic pain 11/29/2020   Gastroesophageal reflux disease 03/18/2020   New onset type 2 diabetes mellitus (HCC) 02/19/2020   Allergic rhinitis 02/17/2020   Thoracic aortic  aneurysm without rupture (HCC) 09/23/2019   Small airways disease 05/13/2019   Class 1 obesity with serious comorbidity and body mass index (BMI) of 31.0 to 31.9 in adult 05/13/2019   Family history of early CAD 05/13/2019   Lung nodule 04/01/2019   Wheeze 04/01/2019   Exertional shortness of breath 04/01/2019   Mediastinal lymphadenopathy 04/01/2019   Hilar lymphadenopathy 04/01/2019   Cervical radiculopathy 10/25/2018   Stable angina pectoris (HCC) 09/30/2018   Overweight (BMI 25.0-29.9) 04/23/2018   DDD (degenerative disc disease), lumbar 11/06/2017   Cardiac murmur 03/16/2017   Lumbar radiculopathy 03/16/2017   Osteoarthritis 06/07/2015   Hyperlipidemia 06/01/2015   Seasonal allergies 01/12/2015   Urticaria 11/30/2014   Degenerative arthritis of hip 10/19/2014   Arthritis of hand, degenerative 10/05/2014   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   Moderate mitral insufficiency 05/01/2014    Past Surgical History:  Procedure Laterality Date   COLONOSCOPY WITH PROPOFOL  N/A 05/15/2017   Procedure: COLONOSCOPY WITH PROPOFOL ;  Surgeon: Luke Salaam, MD;  Location: [facility] ENDOSCOPY;  Service: Gastroenterology;  Laterality: N/A;   COLONOSCOPY WITH PROPOFOL  N/A 09/20/2022   Procedure: COLONOSCOPY WITH PROPOFOL ;  Surgeon: Luke Salaam, MD;  Location: [facility] ENDOSCOPY;  Service: Gastroenterology;  Laterality: N/A;   ESOPHAGOGASTRODUODENOSCOPY (EGD) WITH PROPOFOL  N/A 09/20/2022   Procedure: ESOPHAGOGASTRODUODENOSCOPY (EGD) WITH PROPOFOL ;  Surgeon: Luke Salaam, MD;  Location: [facility] ENDOSCOPY;  Service: Gastroenterology;  Laterality: N/A;   HAND RECONSTRUCTION Bilateral    as a child    Family History  Problem Relation Age of Onset   Diabetes Brother    Heart attack Mother    Breast cancer Neg Hx    Ovarian cancer Neg Hx    Colon cancer Neg Hx    Heart disease Neg Hx     Social History   Tobacco Use   Smoking status: Former    Current packs/day: 0.00    Average packs/day: 1 pack/day for 22.0 years  (22.0 ttl pk-yrs)    Types: Cigarettes    Start date: 02/21/1988    Quit date: 02/20/2010    Years since quitting: 13.3   Smokeless tobacco: Never  Vaping Use   Vaping status: Never Used  Substance Use Topics   Alcohol use: No   Drug use: No     Allergies  Allergen Reactions   Tramadol Rash   Tramadol Hcl Rash    Health Maintenance  Topic Date Due   HEMOGLOBIN A1C  12/27/2022   Diabetic kidney evaluation - eGFR measurement  06/26/2023   Diabetic kidney evaluation - Urine ACR  06/26/2023   COVID-19 Vaccine (5 - 2024-25 season) 07/21/2023 (Originally 10/22/2022)   Zoster Vaccines- Shingrix (1 of 2) 10/05/2023 (Originally 04/28/2012)   Lung Cancer Screening  07/08/2024 (Originally 11/03/2022)   INFLUENZA VACCINE  09/21/2023   MAMMOGRAM  04/02/2024   OPHTHALMOLOGY EXAM  06/26/2024   FOOT EXAM  07/08/2024   Cervical Cancer Screening (HPV/Pap Cotest)  11/29/2025   DTaP/Tdap/Td (3 - Td or Tdap) 08/19/2029   Colonoscopy  09/19/2032   Pneumococcal Vaccine 34-74 Years old  Completed   Hepatitis C Screening  Completed   HIV Screening  Completed   HPV VACCINES  Aged Out   Meningococcal B Vaccine  Aged Out    Chart Review Today: I personally reviewed active problem list, medication list, allergies, family history, social history, health maintenance, notes from last encounter, lab results, imaging with the patient/caregiver today.   Review of Systems  Constitutional: Negative.   HENT: Negative.    Eyes: Negative.   Respiratory: Negative.    Cardiovascular: Negative.   Gastrointestinal: Negative.   Endocrine: Negative.   Genitourinary: Negative.   Musculoskeletal: Negative.   Skin: Negative.   Allergic/Immunologic: Negative.   Neurological: Negative.   Hematological: Negative.   Psychiatric/Behavioral: Negative.    All other systems reviewed and are negative.    Objective:   Vitals:   07/09/23 1501  BP: 124/72  Pulse: 74  Resp: 16  SpO2: 96%  Weight: 187 lb (84.8 kg)   Height: 5\' 4"  (1.626 m)    Body mass index is 32.1 kg/m.  Physical Exam Vitals and nursing note reviewed.  Constitutional:      General: She is not in acute distress.    Appearance: Normal appearance. She is well-developed. She is obese. She is not ill-appearing, toxic-appearing or diaphoretic.  HENT:     Head: Normocephalic and atraumatic.     Right Ear: External ear normal.     Left Ear: External ear normal.     Nose: Nose normal.  Eyes:     General: No scleral icterus.       Right eye: No discharge.        Left eye: No discharge.     Conjunctiva/sclera: Conjunctivae normal.  Neck:     Trachea: No tracheal deviation.  Cardiovascular:     Rate and Rhythm: Normal rate.     Pulses: Normal pulses.     Heart sounds: Normal heart sounds.  Pulmonary:     Effort: Pulmonary effort is normal. No respiratory distress.     Breath sounds: No stridor. Wheezing present. No rhonchi or rales.  Abdominal:     General: Bowel sounds are normal. There is no distension.     Palpations: Abdomen is soft.     Tenderness: There is no abdominal tenderness. There is no right CVA tenderness, left CVA tenderness or guarding.  Skin:    General: Skin is warm and dry.     Findings: No rash.  Neurological:     Mental Status: She is alert.     Motor: No abnormal muscle tone.     Coordination: Coordination normal.     Gait: Gait normal.  Psychiatric:        Mood and Affect: Mood normal.        Behavior: Behavior normal.    Diabetic Foot Exam - Simple   Simple Foot Form Diabetic Foot exam was performed with the following findings: Yes 07/09/2023  3:37 PM  Visual Inspection No deformities, no ulcerations, no other skin breakdown bilaterally: Yes Sensation Testing Intact to touch and monofilament testing bilaterally: Yes Pulse Check Posterior Tibialis and Dorsalis pulse intact bilaterally: Yes Comments         Results for orders placed or performed in visit on 07/02/23  HM DIABETES EYE  EXAM   Collection Time: 06/27/23  3:30 PM  Result Value Ref Range   HM Diabetic Eye Exam No Retinopathy No Retinopathy      Assessment & Plan:   Type 2 diabetes mellitus without complication, without long-term current use of insulin (HCC) Assessment & Plan: DM foot exam done today Due for labs and A1c  Lab Results  Component Value Date   HGBA1C 6.5 (H) 06/26/2022     Orders: -     Hemoglobin A1c -     Comprehensive metabolic panel with GFR -     Microalbumin / creatinine urine ratio -     HM Diabetes Foot Exam -     metFORMIN  HCl ER; Take 1 tablet (750 mg total) by mouth 2 (two) times daily with a meal.  Dispense: 180 tablet; Refill: 1  Gastroesophageal reflux disease, unspecified whether esophagitis present Assessment & Plan: Recently GERD flared up, she asks for refills, more wheeze as well  Orders: -     Hemoglobin A1c -     Comprehensive metabolic panel with GFR -     Microalbumin / creatinine urine ratio -     HM Diabetes Foot Exam -     Pantoprazole  Sodium; Take 1 tablet (40 mg total) by mouth daily as needed for heartburn or indigestion. For stomach protection with NSAIDs and GERD  Dispense: 90 tablet; Refill: 0  Mixed hyperlipidemia Assessment & Plan: On atorvastatin  due for lipid panel  Orders: -     Comprehensive metabolic panel with GFR -     Lipid panel  Sarcoidosis of lung (HCC) Assessment & Plan: Pt with unclear pulm hx and dx was seeing [facility] pulm with frequent changing of providers with residents and attendings -  Will get her est locally to est care and for eval of lung disease/wheeze/DOE  Orders: -     Pulmonary Visit  Anemia, unspecified type Assessment & Plan: Recheck CBC  Orders: -     CBC with Differential/Platelet  Seasonal allergies Assessment & Plan: Refills ordered for current allergy meds  Orders: -     CBC with Differential/Platelet -     Levocetirizine Dihydrochloride ; Take 1 tablet (5 mg total) by mouth every evening.   Dispense: 90 tablet; Refill: 1 -     Montelukast  Sodium; Take 1 tablet (10 mg total) by mouth at bedtime.  Dispense: 90 tablet; Refill: 1 -     Fluticasone  Propionate; Place 2 sprays into both nostrils daily.  Dispense: 16 g; Refill: 6  Encounter for medication monitoring -     Hemoglobin A1c -     Comprehensive metabolic panel with GFR -     Microalbumin / creatinine urine ratio -     HM Diabetes Foot Exam -     Lipid panel -     CBC with Differential/Platelet  Other fatigue -     CBC with Differential/Platelet -     TSH  Positive screening for depression on 9-item Patient Health Questionnaire (PHQ-9) -     CBC with Differential/Platelet -     TSH  Persistent cough -     Budesonide -Formoterol  Fumarate; Inhale 2 puffs into the lungs 2 (two) times daily.  Dispense: 3 each; Refill: 1 -     Pulmonary Visit  Cough, unspecified type -     Levocetirizine Dihydrochloride ; Take 1 tablet (5 mg total) by mouth every evening.  Dispense: 90 tablet; Refill: 1 -     Montelukast  Sodium;  Take 1 tablet (10 mg total) by mouth at bedtime.  Dispense: 90 tablet; Refill: 1 -     Pulmonary Visit  Small airways disease -     Pulmonary Visit     Return for 6 month routine f/up, sooner if needed for acute sx.   Adeline Hone, PA-C 07/09/23 3:29 PM

## 2023-07-09 NOTE — Assessment & Plan Note (Signed)
 Recently GERD flared up, she asks for refills, more wheeze as well

## 2023-07-09 NOTE — Assessment & Plan Note (Signed)
 DM foot exam done today Due for labs and A1c  Lab Results  Component Value Date   HGBA1C 6.5 (H) 06/26/2022

## 2023-07-10 LAB — MICROALBUMIN / CREATININE URINE RATIO
Creatinine, Urine: 104 mg/dL (ref 20–275)
Microalb Creat Ratio: 32 mg/g{creat} — ABNORMAL HIGH (ref <30)
Microalb, Ur: 3.3 mg/dL

## 2023-07-10 LAB — CBC WITH DIFFERENTIAL/PLATELET
Absolute Lymphocytes: 3071 {cells}/uL (ref 850–3900)
Absolute Monocytes: 442 {cells}/uL (ref 200–950)
Basophils Absolute: 41 {cells}/uL (ref 0–200)
Basophils Relative: 0.6 %
Eosinophils Absolute: 159 {cells}/uL (ref 15–500)
Eosinophils Relative: 2.3 %
HCT: 36.6 % (ref 35.0–45.0)
Hemoglobin: 11.8 g/dL (ref 11.7–15.5)
MCH: 28.7 pg (ref 27.0–33.0)
MCHC: 32.2 g/dL (ref 32.0–36.0)
MCV: 89.1 fL (ref 80.0–100.0)
MPV: 11.9 fL (ref 7.5–12.5)
Monocytes Relative: 6.4 %
Neutro Abs: 3188 {cells}/uL (ref 1500–7800)
Neutrophils Relative %: 46.2 %
Platelets: 215 10*3/uL (ref 140–400)
RBC: 4.11 10*6/uL (ref 3.80–5.10)
RDW: 12.8 % (ref 11.0–15.0)
Total Lymphocyte: 44.5 %
WBC: 6.9 10*3/uL (ref 3.8–10.8)

## 2023-07-10 LAB — LIPID PANEL
Cholesterol: 148 mg/dL (ref <200)
HDL: 52 mg/dL (ref >=50)
LDL Cholesterol (Calc): 67 mg/dL
Non-HDL Cholesterol (Calc): 96 mg/dL (ref <130)
Total CHOL/HDL Ratio: 2.8 (calc) (ref <5.0)
Triglycerides: 249 mg/dL — ABNORMAL HIGH (ref <150)

## 2023-07-10 LAB — COMPREHENSIVE METABOLIC PANEL WITH GFR
AG Ratio: 1.8 (calc) (ref 1.0–2.5)
ALT: 20 U/L (ref 6–29)
AST: 18 U/L (ref 10–35)
Albumin: 4.2 g/dL (ref 3.6–5.1)
Alkaline phosphatase (APISO): 101 U/L (ref 37–153)
BUN: 22 mg/dL (ref 7–25)
CO2: 27 mmol/L (ref 20–32)
Calcium: 9.9 mg/dL (ref 8.6–10.4)
Chloride: 106 mmol/L (ref 98–110)
Creat: 0.95 mg/dL (ref 0.50–1.05)
Globulin: 2.4 g/dL (ref 1.9–3.7)
Glucose, Bld: 128 mg/dL — ABNORMAL HIGH (ref 65–99)
Potassium: 4 mmol/L (ref 3.5–5.3)
Sodium: 142 mmol/L (ref 135–146)
Total Bilirubin: 0.5 mg/dL (ref 0.2–1.2)
Total Protein: 6.6 g/dL (ref 6.1–8.1)
eGFR: 68 mL/min/{1.73_m2} (ref >=60)

## 2023-07-10 LAB — HEMOGLOBIN A1C
Hgb A1c MFr Bld: 7.1 % — ABNORMAL HIGH (ref <5.7)
Mean Plasma Glucose: 157 mg/dL
eAG (mmol/L): 8.7 mmol/L

## 2023-07-10 LAB — TSH: TSH: 0.54 m[IU]/L (ref 0.40–4.50)

## 2023-07-11 ENCOUNTER — Other Ambulatory Visit (HOSPITAL_COMMUNITY): Payer: Self-pay

## 2023-07-12 ENCOUNTER — Telehealth: Payer: Self-pay

## 2023-07-12 ENCOUNTER — Ambulatory Visit: Payer: Self-pay | Admitting: Family Medicine

## 2023-07-12 MED ORDER — METFORMIN HCL ER 750 MG PO TB24: 750.0000 mg | ORAL_TABLET | Freq: Two times a day (BID) | ORAL | 1 refills | Status: DC

## 2023-07-12 NOTE — Telephone Encounter (Signed)
 Copied from CRM [id number]. Topic: Clinical - Lab/Test Results >> Jul 12, 2023  3:13 PM Lizabeth Riggs wrote: Reason for CRM:  Brenda Ellison has additional questions about the lab results.  She wants to know if she would be in danger taking more of metformin . She always has questions about her cholesterol. Does she need to go to a diabetic doctor? Please call her back at [phone].

## 2023-07-13 ENCOUNTER — Other Ambulatory Visit: Payer: Self-pay

## 2023-07-13 ENCOUNTER — Telehealth: Payer: Self-pay | Admitting: Internal Medicine

## 2023-07-13 ENCOUNTER — Ambulatory Visit: Payer: Self-pay

## 2023-07-13 DIAGNOSIS — E782 Mixed hyperlipidemia: Secondary | ICD-10-CM

## 2023-07-13 MED ORDER — ATORVASTATIN CALCIUM 20 MG PO TABS: 20.0000 mg | ORAL_TABLET | Freq: Every day | ORAL | 1 refills | Status: DC

## 2023-07-13 NOTE — Telephone Encounter (Signed)
 Pt called in with questions about their labs and medications. RN reviewed with pt results from 5/19 lab work. Pt asked if increasing her dose of metformin  would be unsafe or make her sick. RN responded that the provider asked she increase her dose to get her A1C under control and this is safe to take. RN advised the pt the dosage has been increased and the medication has been sent to her pharmacy. Pt has not yet picked it up but states she will. RN also advised pt that dietary changes can help control her A1C. Pt verbalized understanding.  Pt curious if she needs to be referred to an endocrinologist due to diabetes. RN advised pt RN would relay the question to the office. Pt also mentioned that she needs a pulmonologist too, per chart review it appears that office is working on getting her scheduled.   Pt also states she needs a refill of her atorvastatin . Pt states she has 3-4 days of the medication left. Pt also states she needs refills of Celebrex  and gabapentin, pt states these are prescribed by a different provider for her arthritis. Please follow-up with pt.    Reason for Disposition  Health Information question, no triage required and triager able to answer question  Answer Assessment - Initial Assessment Questions 1. REASON FOR CALL or QUESTION: "What is your reason for calling today?" or "How can I best help you?" or "What question do you have that I can help answer?"     Questions and labs, medications  Protocols used: Information Only Call - No Triage-A-AH

## 2023-07-13 NOTE — Telephone Encounter (Signed)
 fyi

## 2023-07-13 NOTE — Telephone Encounter (Signed)
 Needs to be seen in next two weeks. Can be scheduled in hospital follow up slot. Ok per [employer].

## 2023-07-18 NOTE — Assessment & Plan Note (Signed)
 See above

## 2023-07-18 NOTE — Assessment & Plan Note (Signed)
 Pt with unclear pulm hx and dx was seeing [facility] pulm with frequent changing of providers with residents and attendings -  Will get her est locally to est care and for eval of lung disease/wheeze/DOE

## 2023-07-18 NOTE — Assessment & Plan Note (Signed)
 On atorvastatin  due for lipid panel

## 2023-07-18 NOTE — Assessment & Plan Note (Signed)
 Refills ordered for current allergy meds

## 2023-07-18 NOTE — Assessment & Plan Note (Signed)
 Recheck CBC.

## 2023-07-25 ENCOUNTER — Encounter: Payer: Self-pay | Admitting: Pulmonary Disease

## 2023-07-25 ENCOUNTER — Ambulatory Visit: Admitting: Pulmonary Disease

## 2023-07-25 VITALS — BP 100/70 | HR 80 | Temp 98.4°F | Ht 64.0 in | Wt 181.6 lb

## 2023-07-25 DIAGNOSIS — R059 Cough, unspecified: Secondary | ICD-10-CM

## 2023-07-25 DIAGNOSIS — R0609 Other forms of dyspnea: Secondary | ICD-10-CM | POA: Diagnosis not present

## 2023-07-25 DIAGNOSIS — D869 Sarcoidosis, unspecified: Secondary | ICD-10-CM | POA: Diagnosis not present

## 2023-07-25 LAB — NITRIC OXIDE: Nitric Oxide: 17

## 2023-07-25 MED ORDER — AEROCHAMBER MV MISC: 0 refills | Status: DC

## 2023-07-25 MED ORDER — BUDESONIDE-FORMOTEROL FUMARATE 160-4.5 MCG/ACT IN AERO: 2.0000 | INHALATION_SPRAY | Freq: Two times a day (BID) | RESPIRATORY_TRACT | 12 refills | Status: DC

## 2023-07-26 ENCOUNTER — Other Ambulatory Visit (HOSPITAL_COMMUNITY): Payer: Self-pay

## 2023-07-26 ENCOUNTER — Telehealth: Payer: Self-pay

## 2023-07-26 DIAGNOSIS — N760 Acute vaginitis: Secondary | ICD-10-CM

## 2023-07-26 DIAGNOSIS — N958 Other specified menopausal and perimenopausal disorders: Secondary | ICD-10-CM

## 2023-07-26 MED ORDER — PREMARIN 0.625 MG/GM VA CREA: 1.0000 | TOPICAL_CREAM | VAGINAL | 12 refills | Status: AC

## 2023-07-26 NOTE — Telephone Encounter (Signed)
 Pharmacy Patient Advocate Encounter  Received notification from [facility] MEDICAID that Prior Authorization for Estradiol  0.1MG /GM cream has been DENIED.  See denial reason below. No denial letter attached in CMM. Will attach denial letter to Media tab once received.   PA #/Case ID/Reference #: [id number]

## 2023-07-26 NOTE — Telephone Encounter (Signed)
 Pharmacy Patient Advocate Encounter   Received notification from Onbase that prior authorization for Estradiol  0.1MG /GM cream is required/requested.   Insurance verification completed.   The patient is insured through [facility] MEDICAID .   Per test claim: PA required; PA submitted to above mentioned insurance via CoverMyMeds Key/confirmation #/EOC [id number] Status is pending

## 2023-07-26 NOTE — Telephone Encounter (Signed)
 When I was doing a prior auth for this lady, I realized her last name should have an E at the end, according to her insurance card. Are you all able to fix this?

## 2023-07-26 NOTE — Addendum Note (Signed)
 Addended by: Keiona Jenison on: 07/26/2023 03:15 PM   Modules accepted: Orders

## 2023-07-27 NOTE — Telephone Encounter (Signed)
 Spoke with pt and she does not have a E at the end of her name and according to her drivers licenes she does not either. Therefore we are not able to change her name. sorry

## 2023-07-30 NOTE — Telephone Encounter (Signed)
 According to her insurance it does have an E on it so she will need to correct that.

## 2023-08-01 NOTE — Progress Notes (Signed)
 Synopsis: Referred in by Adeline Hone, PA-C   Subjective:   PATIENT ID: Brenda Ellison GENDER: female DOB: 20-Nov-1960, MRN: [id number]  Chief Complaint  Patient presents with   New Patient (Initial Visit)    Chronic cough, small airways, sarcoidosis  Has been having SOB/DOE, wheezing, can't do much physical activity. Symptoms have been going on for awhile, gets tired very easily. Not currently using inhalers that have been prescribed, but keeps them in the car in case the weather is too hot because she gets very SOB then. Excessive daytime fatigue noted too -- epworth score of 21    HPI Brenda Ellison is a pleasant 63 years old female patient with a past medical history history of hyperlipidemia, type II DM, GERD presenting today to the pulmonary clinic to establish care.   She follows with pulmonary at [facility] when she was seen initially for concerns of sarcoidosis in 2021 due to a calcified LAD without pulmonary involvement. She did not have any parenchymal involvement at the time and no further eval was obtained. She continued to see pulmonary and had PFTs in 2021 that showed significant response to bronchodilators. Saw them few times after that for shortness of breath on exertion intermittent coughing and was placed on symbicort  intermittently.   PFTs in 2024 FEV 1.23L decrease, FVC 1.69L decreased and normal ratio. DLCO reduced.   CXR 2023 - No active cardiopulmonary disease.   CT Chest in 2021 - No parenchymal lung disease.   ROS All systems were reviewed and are negative except for the above.  Objective:   Vitals:   07/25/23 1126  BP: 100/70  Pulse: 80  Temp: 98.4 F (36.9 C)  TempSrc: Oral  SpO2: 96%  Weight: 181 lb 9.6 oz (82.4 kg)  Height: 5' 4 (1.626 m)   96% on RA BMI Readings from Last 3 Encounters:  07/25/23 31.17 kg/m  07/09/23 32.10 kg/m  04/10/23 31.38 kg/m   Wt Readings from Last 3 Encounters:  07/25/23 181 lb 9.6 oz (82.4 kg)  07/09/23 187 lb (84.8  kg)  04/10/23 182 lb 12.8 oz (82.9 kg)    Physical Exam GEN: NAD, Healthy Appearing HEENT: Supple Neck, Reactive Pupils, EOMI  CVS: Normal S1, Normal S2, RRR, No murmurs or ES appreciated  Lungs: Clear bilateral air entry.  Abdomen: Soft, non tender, non distended, + BS  Extremities: Warm and well perfused, No edema  Skin: No suspicious lesions appreciated  Psych: Normal Affect  Ancillary Information   CBC    Component Value Date/Time   WBC 6.9 07/09/2023 1546   RBC 4.11 07/09/2023 1546   HGB 11.8 07/09/2023 1546   HGB 12.2 07/24/2022 1546   HCT 36.6 07/09/2023 1546   HCT 36.6 07/24/2022 1546   PLT 215 07/09/2023 1546   PLT 244 07/24/2022 1546   MCV 89.1 07/09/2023 1546   MCV 89 07/24/2022 1546   MCV 89 06/17/2013 0810   MCH 28.7 07/09/2023 1546   MCHC 32.2 07/09/2023 1546   RDW 12.8 07/09/2023 1546   RDW 13.1 07/24/2022 1546   RDW 13.1 06/17/2013 0810   LYMPHSABS 2,725 06/26/2022 1544   LYMPHSABS 2.1 06/17/2013 0810   MONOABS 0.5 10/20/2014 1015   MONOABS 0.4 06/17/2013 0810   EOSABS 159 07/09/2023 1546   EOSABS 0.2 06/17/2013 0810   BASOSABS 41 07/09/2023 1546   BASOSABS 0.0 06/17/2013 0810    Labs and imaging were reviewed.      No data to display  Assessment & Plan:  Brenda Ellison is a pleasant 63 years old female patient with a past medical history history of hyperlipidemia, type II DM, GERD presenting today to the pulmonary clinic to establish care.   #DOE and intermittent cough  Suspecting moderate persistent asthma with Reported PFTs in 2021 with sig response to bdl. EOS 159. Feno 17 indeterminate for intrapulmonary eosinophilic inflammation.   []  PFTs  []  Start budesonide -formoterol  [Symbicort ] 160-4.5 2 puffs bid. Aerochamber provided. Rinse mouth with water  after use.  []  Albuterol  as needed.   #Sarcoidosis  This was a concern iso calcified LAD ?? No parenchymal involvement. Will repeat CT chest wo contrast.   Return in about 3  months (around 10/25/2023).  I spent 60 minutes caring for this patient today, including preparing to see the patient, obtaining a medical history , reviewing a separately obtained history, performing a medically appropriate examination and/or evaluation, counseling and educating the patient/family/caregiver, ordering medications, tests, or procedures, documenting clinical information in the electronic health record, and independently interpreting results (not separately reported/billed) and communicating results to the patient/family/caregiver  Annitta Kindler, MD [facility] 08/01/2023 3:26 PM

## 2023-10-05 ENCOUNTER — Ambulatory Visit: Payer: Self-pay

## 2023-10-05 NOTE — Telephone Encounter (Signed)
 Patient states she will go to Urgent Care due to no availability in PCP office  FYI Only or Action Required?: FYI only for provider.  Patient was last seen in primary care on 04/10/2023 by Glenard Mire, MD.  Called Nurse Triage reporting Fatigue and Bilateral Leg Pain.  Symptoms began patient states a while many months ago.  Interventions attempted: Rest, hydration, or home remedies.  Symptoms are: gradually worsening.  Triage Disposition: See HCP Within 4 Hours (Or PCP Triage)  Patient/caregiver understands and will follow disposition?: Yes       Copied from CRM #[id number]. Topic: Clinical - Red Word Triage >> Oct 05, 2023  1:10 PM Selinda RAMAN wrote: [facility] that prompted transfer to Nurse Triage: The patient called in wanting to be seen due to bilateral leg pain. It bothers her everyday and she gets out of breath very easily. I will transfer her to E2C2 NT Reason for Disposition  [1] MODERATE weakness (e.g., interferes with work, school, normal activities) AND [2] cause unknown  (Exceptions: Weakness from acute minor illness or poor fluid intake; weakness is chronic and not worse.)  Answer Assessment - Initial Assessment Questions Patient states she has a history of arthritis ---both legs hurt every day, both legs always tired Patient works a lot Patient also states that she fell about a month ago because her legs were hurting and weak & her left shoulder is still bothering her Patient is advised that she needs to have this left shoulder evaluated Patient is at work right now She states that her breathing is not any worse than it ever normally is She states that she is just concerned with her legs hurting all the time. Patient states that a month ago she fell on her left shoulder and it is still bothering her She is advised that it is recommended that she be seen and have these issues addressed as soon as possible. Patient states that she is just concerned about her legs  hurting all the time and her legs always being so tired She denies any swelling in her legs She states that she won't go to the Emergency Room but she will go to Urgent Care This RN advised her that Urgent Care is advised so she can be seen and assessed in the next couple of hours. Patient states she will go to Urgent Care   1. DESCRIPTION: Describe how you are feeling.     Legs always feeling tired 2. SEVERITY: How bad is it?  Can you stand and walk?     Legs always feeling tired 3. ONSET: When did these symptoms begin? (e.g., hours, days, weeks, months)     A while---months 4. CAUSE: What do you think is causing the weakness or fatigue? (e.g., not drinking enough fluids, medical problem, trouble sleeping)     Unsure 5. NEW MEDICINES:  Have you started on any new medicines recently? (e.g., opioid pain medicines, benzodiazepines, muscle relaxants, antidepressants, antihistamines, neuroleptics, beta blockers)     No 6. OTHER SYMPTOMS: Do you have any other symptoms? (e.g., chest pain, fever, cough, SOB, vomiting, diarrhea, bleeding, other areas of pain)     Tired, Legs feels tired,  Protocols used: Weakness (Generalized) and Fatigue-A-AH

## 2023-10-05 NOTE — Telephone Encounter (Signed)
 Appt sch'd with Leisa for 10/16/23

## 2023-10-06 ENCOUNTER — Other Ambulatory Visit: Payer: Self-pay

## 2023-10-06 ENCOUNTER — Emergency Department
Admission: EM | Admit: 2023-10-06 | Discharge: 2023-10-06 | Disposition: A | Discharge status: Home / Self Care | Attending: Emergency Medicine | Admitting: Emergency Medicine

## 2023-10-06 ENCOUNTER — Emergency Department

## 2023-10-06 DIAGNOSIS — M25512 Pain in left shoulder: Secondary | ICD-10-CM | POA: Insufficient documentation

## 2023-10-06 DIAGNOSIS — M25561 Pain in right knee: Secondary | ICD-10-CM | POA: Insufficient documentation

## 2023-10-06 NOTE — Discharge Instructions (Signed)
 Tylenol  or ibuprofen  for pain Apply ice Return if worsening See orthopedics if not improving 1 week

## 2023-10-06 NOTE — ED Provider Notes (Signed)
 [facility] Provider Note    Event Date/Time   First MD Initiated Contact with Patient 10/06/23 1535     (approximate)   History   Knee Pain   HPI  Brenda Ellison is a 63 y.o. female history of osteoarthritis presents emergency department complaining of right knee pain and left shoulder pain.  2 falls in the past month.  Has not been checked out.  States today she woke up and her knee was little bit worse.  Family became concerned and thought she should come to the ED to be evaluated.  No numbness tingling.  No other injuries reported      Physical Exam   Triage Vital Signs: ED Triage Vitals  Encounter Vitals Group     BP 10/06/23 1522 126/75     Girls Systolic BP Percentile --      Girls Diastolic BP Percentile --      Boys Systolic BP Percentile --      Boys Diastolic BP Percentile --      Pulse Rate 10/06/23 1522 73     Resp 10/06/23 1522 16     Temp 10/06/23 1522 99.5 F (37.5 C)     Temp Source 10/06/23 1522 Oral     SpO2 10/06/23 1522 96 %     Weight 10/06/23 1520 187 lb (84.8 kg)     Height 10/06/23 1520 5' 4 (1.626 m)     Head Circumference --      Peak Flow --      Pain Score 10/06/23 1518 9     Pain Loc --      Pain Education --      Exclude from Growth Chart --     Most recent vital signs: Vitals:   10/06/23 1522  BP: 126/75  Pulse: 73  Resp: 16  Temp: 99.5 F (37.5 C)  SpO2: 96%     General: Awake, no distress.   CV:  Good peripheral perfusion.  Resp:  Normal effort.  Abd:  No distention.   Other:  Left shoulder minimally tender, full range of motion, neurovascular intact, right knee tender, slightly swollen at the joint line, full range of motion, neurovascular intact   ED Results / Procedures / Treatments   Labs (all labs ordered are listed, but only abnormal results are displayed) Labs Reviewed - No data to display   EKG     RADIOLOGY X-ray of the right knee and left  shoulder    PROCEDURES:   Procedures  Critical Care:  no Chief Complaint  Patient presents with   Knee Pain      MEDICATIONS ORDERED IN ED: Medications - No data to display   IMPRESSION / MDM / ASSESSMENT AND PLAN / ED COURSE  I reviewed the triage vital signs and the nursing notes.                              Differential diagnosis includes, but is not limited to, fracture, Tatian, effusion, bursitis  Patient's presentation is most consistent with acute illness / injury with system symptoms.   Patient's symptoms are very mild.  Injury happened about a month ago.  Will still x-ray due to the new swelling in the knee.  Patient also requesting left shoulder x-ray.  X-ray of the right knee independently reviewed interpreted by me send small effusion.  Compartment radiology  X-ray left shoulder independently reviewed interpreted by  me as being negative for any acute abnormality other than arthritis  I did explain these findings to the patient.  Tylenol  or ibuprofen  for pain.  She already has a knee brace on so she can continue to wear this as needed.  Follow-up with orthopedics if not improving 1 week.  Patient is in agreement with treatment plan.  She was discharged stable condition.      FINAL CLINICAL IMPRESSION(S) / ED DIAGNOSES   Final diagnoses:  Acute pain of right knee  Acute pain of left shoulder     Rx / DC Orders   ED Discharge Orders     None        Note:  This document was prepared using Dragon voice recognition software and may include unintentional dictation errors.    Gasper Devere ORN, PA-C 10/06/23 1644    Jacolyn Pae, MD 10/06/23 [phone]

## 2023-10-06 NOTE — ED Triage Notes (Signed)
 Pt to ED for R knee pain. Had 2 falls in last month, has not been seen. Pt is wearing knee brace from a friend. Pt is ambulatory. States also has L shoulder swelling from previous fall. Hx arthritis.

## 2023-10-11 ENCOUNTER — Ambulatory Visit: Admitting: Nurse Practitioner

## 2023-10-16 ENCOUNTER — Encounter: Payer: Self-pay | Admitting: Family Medicine

## 2023-10-16 ENCOUNTER — Ambulatory Visit (INDEPENDENT_AMBULATORY_CARE_PROVIDER_SITE_OTHER): Admitting: Family Medicine

## 2023-10-16 VITALS — BP 120/68 | HR 80 | Resp 16 | Ht 64.0 in | Wt 187.0 lb

## 2023-10-16 DIAGNOSIS — M25512 Pain in left shoulder: Secondary | ICD-10-CM

## 2023-10-16 DIAGNOSIS — M25561 Pain in right knee: Secondary | ICD-10-CM | POA: Diagnosis not present

## 2023-10-16 DIAGNOSIS — Z09 Encounter for follow-up examination after completed treatment for conditions other than malignant neoplasm: Secondary | ICD-10-CM | POA: Diagnosis not present

## 2023-10-16 DIAGNOSIS — K219 Gastro-esophageal reflux disease without esophagitis: Secondary | ICD-10-CM | POA: Diagnosis not present

## 2023-10-16 MED ORDER — MELOXICAM 15 MG PO TABS: 7.5000 mg | ORAL_TABLET | Freq: Every day | ORAL | 0 refills | Status: DC | PRN

## 2023-10-16 NOTE — Patient Instructions (Addendum)
 Call [facility] to see if they got your ER referral or take your insurance and let me know if you can't go there for your shoulder and joint pain.  [facility] [address] [place], [place] [address] Phone: [phone] [email or web address]

## 2023-10-16 NOTE — Progress Notes (Signed)
 Patient ID: Brenda Ellison, female    DOB: May 16, 1960, 63 y.o.   MRN: [id number]  PCP: Leavy Mole, PA-C  Chief Complaint  Patient presents with   Leg Pain    B/L, Per patient it has been going on for forever and a day. Feels like pain is worsening.    Subjective:   Brenda Ellison is a 63 y.o. female, presents to clinic with CC of the following:  HPI  Here for f/up on leg/knee pain shoulder pain and ER visit Discussed the use of AI scribe software for clinical note transcription with the patient, who gave verbal consent to proceed.  History of Present Illness [name] is a 63 year old female with osteoarthritis who presents with joint pain and swelling.  Articular pain and swelling - Joint pain and swelling in the left shoulder and right knee - Right knee is swollen with recurrent effusions - Difficulty sleeping due to left shoulder pain when lying on left side - X-rays show arthritic changes in the left shoulder and osteoarthritis in the right knee with swelling - Inquires about receiving an injection for arthritis  Gait instability and falls - Two significant falls in the last month, associated with right knee swelling  Back pain - History of back issues - Previous use of muscle relaxers for back pain  Diabetes management concerns - Expresses difficulty managing health issues alone - Concerned about diabetes management    Patient Active Problem List   Diagnosis Date Noted   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/2023   Anemia 09/20/2022   Type 2 diabetes mellitus without complication, without long-term current use of insulin (HCC) 06/30/2021   Sarcoidosis of lung (HCC) 06/06/2021   Pelvic pain 11/29/2020   Gastroesophageal reflux disease 03/18/2020   New onset type 2 diabetes mellitus (HCC) 02/19/2020   Allergic rhinitis 02/17/2020   Thoracic aortic aneurysm without rupture (HCC) 09/23/2019   Small airways disease 05/13/2019   Class 1 obesity with  serious comorbidity and body mass index (BMI) of 31.0 to 31.9 in adult 05/13/2019   Family history of early CAD 05/13/2019   Lung nodule 04/01/2019   Wheeze 04/01/2019   Exertional shortness of breath 04/01/2019   Mediastinal lymphadenopathy 04/01/2019   Hilar lymphadenopathy 04/01/2019   Cervical radiculopathy 10/25/2018   Stable angina pectoris (HCC) 09/30/2018   Overweight (BMI 25.0-29.9) 04/23/2018   DDD (degenerative disc disease), lumbar 11/06/2017   Cardiac murmur 03/16/2017   Lumbar radiculopathy 03/16/2017   Osteoarthritis 06/07/2015   Hyperlipidemia 06/01/2015   Seasonal allergies 01/12/2015   Urticaria 11/30/2014   Degenerative arthritis of hip 10/19/2014   Arthritis of hand, degenerative 10/05/2014   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   Moderate mitral insufficiency 05/01/2014      Current Outpatient Medications:    albuterol  (VENTOLIN  HFA) 108 (90 Base) MCG/ACT inhaler, Inhale 2 puffs into the lungs every 4 (four) hours as needed for wheezing or shortness of breath., Disp: 18 g, Rfl: 3   albuterol  (VENTOLIN  HFA) 108 (90 Base) MCG/ACT inhaler, , Disp: , Rfl:    atorvastatin  (LIPITOR) 20 MG tablet, Take 1 tablet (20 mg total) by mouth at bedtime., Disp: 90 tablet, Rfl: 1   blood glucose meter kit and supplies, Dispense based on patient and insurance preference. Use up to four times daily as directed. (FOR ICD-10 E10.9, E11.9)., Disp: 1 each, Rfl: 0   budesonide -formoterol  (SYMBICORT ) 160-4.5 MCG/ACT inhaler, Inhale 2 puffs into the lungs 2 (two) times daily., Disp: 3 each,  Rfl: 1   cetirizine (ZYRTEC ALLERGY) 10 MG tablet, 1 tablet Orally Once a day for 30 day(s), Disp: , Rfl:    Cholecalciferol (VITAMIN D3) 2000 UNITS capsule, Take 2,000 Units by mouth daily., Disp: , Rfl:    EQ ALL DAY ALLERGY RELIEF 10 MG tablet, Take 1 tablet by mouth once daily, Disp: 90 tablet, Rfl: 0   fluticasone  (FLONASE ) 50 MCG/ACT nasal spray, Place 2 sprays into both nostrils daily., Disp: 16 g, Rfl:  6   gabapentin (NEURONTIN) 100 MG capsule, Take 100 mg by mouth 2 (two) times daily., Disp: , Rfl:    levocetirizine (XYZAL ) 5 MG tablet, Take 1 tablet (5 mg total) by mouth every evening., Disp: 90 tablet, Rfl: 1   meclizine  (ANTIVERT ) 12.5 MG tablet, Take 1 tablet (12.5 mg total) by mouth 3 (three) times daily as needed for dizziness., Disp: 30 tablet, Rfl: 0   metFORMIN  (GLUCOPHAGE -XR) 750 MG 24 hr tablet, Take 1 tablet (750 mg total) by mouth 2 (two) times daily with a meal., Disp: 180 tablet, Rfl: 1   montelukast  (SINGULAIR ) 10 MG tablet, Take 1 tablet (10 mg total) by mouth at bedtime., Disp: 90 tablet, Rfl: 1   pantoprazole  (PROTONIX ) 40 MG tablet, Take 1 tablet (40 mg total) by mouth daily as needed for heartburn or indigestion. For stomach protection with NSAIDs and GERD, Disp: 90 tablet, Rfl: 0   PREMARIN  vaginal cream, Place 1 Applicatorful vaginally 3 (three) times a week. At bedtime, apply intravaginally and spread any left over cream to external genitalia and urethra with finger, Disp: 42.5 g, Rfl: 12   tiZANidine  (ZANAFLEX ) 4 MG tablet, Take 1 tablet (4 mg total) by mouth every 8 (eight) hours as needed for muscle spasms (muscle tightness/spasms)., Disp: 30 tablet, Rfl: 0   Allergies  Allergen Reactions   Tramadol Rash   Tramadol Hcl Rash     Social History   Tobacco Use   Smoking status: Former    Current packs/day: 0.00    Average packs/day: 1 pack/day for 22.0 years (22.0 ttl pk-yrs)    Types: Cigarettes    Start date: 02/21/1988    Quit date: 02/20/2010    Years since quitting: 13.6   Smokeless tobacco: Never  Vaping Use   Vaping status: Never Used  Substance Use Topics   Alcohol use: No   Drug use: No      Chart Review Today: I personally reviewed active problem list, medication list, allergies, family history, social history, health maintenance, notes from last encounter, lab results, imaging with the patient/caregiver today.   Review of Systems   Constitutional: Negative.   HENT: Negative.    Eyes: Negative.   Respiratory: Negative.    Cardiovascular: Negative.   Gastrointestinal: Negative.   Endocrine: Negative.   Genitourinary: Negative.   Musculoskeletal: Negative.   Skin: Negative.   Allergic/Immunologic: Negative.   Neurological: Negative.   Hematological: Negative.   Psychiatric/Behavioral: Negative.    All other systems reviewed and are negative.      Objective:   Vitals:   10/16/23 1442  BP: 120/68  Pulse: 80  Resp: 16  SpO2: 95%  Weight: 187 lb (84.8 kg)  Height: 5' 4 (1.626 m)    Body mass index is 32.1 kg/m.  Physical Exam Vitals and nursing note reviewed.  Constitutional:      General: She is not in acute distress.    Appearance: Normal appearance. She is well-developed. She is obese. She is not ill-appearing, toxic-appearing or  diaphoretic.  HENT:     Head: Normocephalic and atraumatic.     Right Ear: External ear normal.     Left Ear: External ear normal.     Nose: Nose normal.  Eyes:     General: No scleral icterus.       Right eye: No discharge.        Left eye: No discharge.     Conjunctiva/sclera: Conjunctivae normal.  Neck:     Trachea: No tracheal deviation.  Cardiovascular:     Rate and Rhythm: Normal rate.  Pulmonary:     Effort: Pulmonary effort is normal. No respiratory distress.     Breath sounds: No stridor.  Musculoskeletal:     Left shoulder: Tenderness and bony tenderness present. Decreased range of motion.     Right knee: No swelling or bony tenderness. Decreased range of motion.  Skin:    General: Skin is warm and dry.     Findings: No rash.  Neurological:     Mental Status: She is alert.     Motor: No abnormal muscle tone.     Coordination: Coordination normal.     Gait: Gait abnormal.  Psychiatric:        Mood and Affect: Mood normal.        Behavior: Behavior normal.      Results for orders placed or performed in visit on 07/25/23  Nitric oxide     Collection Time: 07/25/23 11:54 AM  Result Value Ref Range   Nitric Oxide  17        Assessment & Plan:   1. Hospital discharge follow-up (Primary) ER visit notes and imaging reviewed today with pt  2. Acute pain of right knee Pain, OA, swelling and effusion on ER exam and xray  Make sure she f/up with ortho Can try mobic  - meloxicam  (MOBIC ) 15 MG tablet; Take 0.5-1 tablets (7.5-15 mg total) by mouth daily as needed for pain (joint pain).  Dispense: 30 tablet; Refill: 0 - Ambulatory referral to Orthopedic Surgery  3. Acute pain of left shoulder Very tender on exam - meloxicam  (MOBIC ) 15 MG tablet; Take 0.5-1 tablets (7.5-15 mg total) by mouth daily as needed for pain (joint pain).  Dispense: 30 tablet; Refill: 0 - Ambulatory referral to Orthopedic Surgery  4. Gastroesophageal reflux disease, unspecified whether esophagitis present Pt encouraged to keep GERD controlled and monitor with mobic     Assessment and Plan Assessment & Plan Osteoarthritis of left shoulder and right knee with effusion Osteoarthritis in the left shoulder and right knee with right knee effusion indicating inflammation. Recent falls have exacerbated pain and swelling. - Refer to orthopedic specialist for evaluation and management of left shoulder and right knee. - Advise her to contact [facility] to confirm insurance acceptance and ER referral. - Consider MRI and joint injection for inflammation as per orthopedic recommendation. - Discuss potential physical therapy for pain management.  Type 2 diabetes mellitus without complication Diabetes management requires reassessment. A1c needs checking to monitor glucose control. - Schedule follow-up within three months to reassess diabetes management and check A1c.  Gastroesophageal reflux disease Occasional stomach discomfort and bowel changes possibly related to diet.  - Monitor symptoms and dietary intake to identify gastrointestinal discomfort  triggers.  Recording duration: 11 minutes    Return for next 3 months f/up for diabetes (if not already scheduled).     Michelene Cower, PA-C 10/16/23 2:49 PM

## 2023-10-18 ENCOUNTER — Ambulatory Visit: Payer: Self-pay

## 2023-10-18 NOTE — Telephone Encounter (Signed)
 Pt notified rx was written on 10/16/23 at [facility]

## 2023-10-18 NOTE — Telephone Encounter (Signed)
 FYI Only or Action Required?: FYI only for provider.  Patient was last seen in primary care on 04/10/2023 by Glenard Mire, MD.  Called Nurse Triage reporting Advice Only.  Symptoms began information only.  Interventions attempted: Other: information only.  Symptoms are: information only.  Triage Disposition: Information or Advice Only Call  Patient/caregiver understands and will follow disposition?: Yes                             Copied from CRM #[id number]. Topic: Clinical - Medication Question >> Oct 18, 2023  9:56 AM [name] wrote: Reason for CRM: Patient would like to know if she can get prescribed Mexicam for inflammation. Please advise [phone] (M) Reason for Disposition  General information question, no triage required and triager able to answer question  Answer Assessment - Initial Assessment Questions 1. REASON FOR CALL: What is the main reason for your call? or How can I best help you?     Patient called in requesting Meloxicam . Patient was seen for OV on 10/16/23. Per chart, patient has already been prescribed this medication. Advised patient to pick-up medication at pharmacy. Patient verbalized understanding.  Protocols used: Information Only Call - No Triage-A-AH

## 2023-10-30 ENCOUNTER — Encounter

## 2023-10-30 ENCOUNTER — Ambulatory Visit: Admitting: Pulmonary Disease

## 2023-11-27 ENCOUNTER — Ambulatory Visit: Admitting: Pulmonary Disease

## 2023-11-27 DIAGNOSIS — D869 Sarcoidosis, unspecified: Secondary | ICD-10-CM

## 2023-11-27 LAB — PULMONARY FUNCTION TEST
DL/VA % pred: 104 %
DL/VA: 4.42 ml/min/mmHg/L
DLCO unc % pred: 57 %
DLCO unc: 11.4 ml/min/mmHg
FEF 25-75 Post: 1.47 L/s
FEF 25-75 Pre: 0.79 L/s
FEF2575-%Change-Post: 87 %
FEF2575-%Pred-Post: 63 %
FEF2575-%Pred-Pre: 34 %
FEV1-%Change-Post: 20 %
FEV1-%Pred-Post: 47 %
FEV1-%Pred-Pre: 39 %
FEV1-Post: 1.2 L
FEV1-Pre: 1 L
FEV1FVC-%Change-Post: -1 %
FEV1FVC-%Pred-Pre: 96 %
FEV6-%Change-Post: 22 %
FEV6-%Pred-Post: 51 %
FEV6-%Pred-Pre: 42 %
FEV6-Post: 1.64 L
FEV6-Pre: 1.34 L
FEV6FVC-%Pred-Post: 103 %
FEV6FVC-%Pred-Pre: 103 %
FVC-%Change-Post: 22 %
FVC-%Pred-Post: 49 %
FVC-%Pred-Pre: 40 %
FVC-Post: 1.64 L
FVC-Pre: 1.34 L
Post FEV1/FVC ratio: 73 %
Post FEV6/FVC ratio: 100 %
Pre FEV1/FVC ratio: 75 %
Pre FEV6/FVC Ratio: 100 %
RV % pred: 103 %
RV: 2.07 L
TLC % pred: 70 %
TLC: 3.56 L

## 2023-11-27 NOTE — Progress Notes (Signed)
 Full PFT completed today ? ?

## 2023-11-27 NOTE — Patient Instructions (Signed)
 Full PFT completed today ? ?

## 2023-11-30 ENCOUNTER — Encounter: Payer: Self-pay | Admitting: Pulmonary Disease

## 2023-11-30 ENCOUNTER — Ambulatory Visit (INDEPENDENT_AMBULATORY_CARE_PROVIDER_SITE_OTHER): Admitting: Pulmonary Disease

## 2023-11-30 VITALS — BP 110/70 | HR 86 | Temp 97.5°F | Ht 64.0 in | Wt 174.4 lb

## 2023-11-30 DIAGNOSIS — R0602 Shortness of breath: Secondary | ICD-10-CM | POA: Diagnosis not present

## 2023-11-30 MED ORDER — ALBUTEROL SULFATE HFA 108 (90 BASE) MCG/ACT IN AERS: 2.0000 | INHALATION_SPRAY | Freq: Four times a day (QID) | RESPIRATORY_TRACT | 3 refills | Status: AC | PRN

## 2023-11-30 NOTE — Progress Notes (Signed)
 Synopsis: Referred in by Malka Domino, MD   Subjective:   PATIENT ID: Ronal DELENA Gowda GENDER: female DOB: 03/07/1960, MRN: [id number]  No chief complaint on file.   HPI Ms. Bernet is a pleasant 63 years old female patient with a past medical history history of hyperlipidemia, type II DM, GERD presenting today to the pulmonary clinic to establish care.   She follows with pulmonary at [facility] when she was seen initially for concerns of sarcoidosis in 2021 due to a calcified LAD without pulmonary involvement. She did not have any parenchymal involvement at the time and no further eval was obtained. She continued to see pulmonary and had PFTs in 2021 that showed significant response to bronchodilators. Saw them few times after that for shortness of breath on exertion intermittent coughing and was placed on symbicort  intermittently.   PFTs in 2024 FEV 1.23L decrease, FVC 1.69L decreased and normal ratio. DLCO reduced.   CXR 2023 - No active cardiopulmonary disease.   CT Chest in 2021 - No parenchymal lung disease.   OV 11/30/2023 - Ms. Ley is here to follow up re her PFTs. The latter show Restrictive pattern. Significant response to bronchodilators. Moderate restriction and moderately reduced DLCO. This is consistent with a mixed picture asthma and ILD. I will obtain a HRCT to evaluate for parenchymal lung disease. I will also obtain an echocardiogram as her DLCO appears to be decreased out of proportion to her restriction.   ROS All systems were reviewed and are negative except for the above.  Objective:   There were no vitals filed for this visit.    on RA BMI Readings from Last 3 Encounters:  11/27/23 30.11 kg/m  10/16/23 32.10 kg/m  10/06/23 32.10 kg/m   Wt Readings from Last 3 Encounters:  11/27/23 175 lb 6.4 oz (79.6 kg)  10/16/23 187 lb (84.8 kg)  10/06/23 187 lb (84.8 kg)    Physical Exam GEN: NAD, Healthy Appearing HEENT: Supple Neck, Reactive Pupils, EOMI   CVS: Normal S1, Normal S2, RRR, No murmurs or ES appreciated  Lungs: Clear bilateral air entry.  Abdomen: Soft, non tender, non distended, + BS  Extremities: Warm and well perfused, No edema  Skin: No suspicious lesions appreciated  Psych: Normal Affect  Ancillary Information   CBC    Component Value Date/Time   WBC 6.9 07/09/2023 1546   RBC 4.11 07/09/2023 1546   HGB 11.8 07/09/2023 1546   HGB 12.2 07/24/2022 1546   HCT 36.6 07/09/2023 1546   HCT 36.6 07/24/2022 1546   PLT 215 07/09/2023 1546   PLT 244 07/24/2022 1546   MCV 89.1 07/09/2023 1546   MCV 89 07/24/2022 1546   MCV 89 06/17/2013 0810   MCH 28.7 07/09/2023 1546   MCHC 32.2 07/09/2023 1546   RDW 12.8 07/09/2023 1546   RDW 13.1 07/24/2022 1546   RDW 13.1 06/17/2013 0810   LYMPHSABS 2,725 06/26/2022 1544   LYMPHSABS 2.1 06/17/2013 0810   MONOABS 0.5 10/20/2014 1015   MONOABS 0.4 06/17/2013 0810   EOSABS 159 07/09/2023 1546   EOSABS 0.2 06/17/2013 0810   BASOSABS 41 07/09/2023 1546   BASOSABS 0.0 06/17/2013 0810    Labs and imaging were reviewed.     Latest Ref Rng & Units 11/27/2023    9:53 AM  PFT Results  FVC-Pre L 1.34  P  FVC-Predicted Pre % 40  P  FVC-Post L 1.64  P  FVC-Predicted Post % 49  P  Pre FEV1/FVC % %  75  P  Post FEV1/FCV % % 73  P  FEV1-Pre L 1.00  P  FEV1-Predicted Pre % 39  P  FEV1-Post L 1.20  P  DLCO uncorrected ml/min/mmHg 11.40  P  DLCO UNC% % 57  P  DLVA Predicted % 104  P  TLC L 3.56  P  TLC % Predicted % 70  P  RV % Predicted % 103  P    P Preliminary result     Assessment & Plan:  Ms. Biggs is a pleasant 63 years old female patient with a past medical history history of hyperlipidemia, type II DM, GERD presenting today to the pulmonary clinic to establish care.   #DOE and intermittent cough  Suspecting moderate persistent asthma with Reported PFTs in 2021 with sig response to bdl. EOS 159. Feno 17 indeterminate for intrapulmonary eosinophilic inflammation.   []   Start budesonide -formoterol  [Symbicort ] 160-4.5 2 puffs bid. Aerochamber provided. Rinse mouth with water  after use.  []  Albuterol  as needed.   #Suspicion for Sarcoidosis  This was a concern iso calcified LAD?? Or dilated aortic root but unclear to me where this is coming from. PFTs are suggestive of a mixed restriction and obstruction picture which could be seen in Sarcoidosis. Therefore I will obtain a HRCT as well as an echocardiogram.    RTC 6 monhts.   I spent 40 minutes caring for this patient today, including preparing to see the patient, obtaining a medical history , reviewing a separately obtained history, performing a medically appropriate examination and/or evaluation, counseling and educating the patient/family/caregiver, ordering medications, tests, or procedures, documenting clinical information in the electronic health record, and independently interpreting results (not separately reported/billed) and communicating results to the patient/family/caregiver  Darrin Barn, MD [facility] 11/30/2023 11:18 AM

## 2024-01-07 ENCOUNTER — Ambulatory Visit
Admission: RE | Admit: 2024-01-07 | Discharge: 2024-01-07 | Disposition: A | Discharge status: Home / Self Care | Source: Ambulatory Visit | Attending: Pulmonary Disease | Admitting: Pulmonary Disease

## 2024-01-07 DIAGNOSIS — R0602 Shortness of breath: Secondary | ICD-10-CM | POA: Diagnosis present

## 2024-01-07 LAB — ECHOCARDIOGRAM COMPLETE
AR max vel: 2.57 cm2
AV Area VTI: 2.58 cm2
AV Area mean vel: 2.49 cm2
AV Mean grad: 4 mmHg
AV Peak grad: 7.7 mmHg
Ao pk vel: 1.39 m/s
Area-P 1/2: 3.6 cm2
Calc EF: 66.1 %
MV M vel: 5.08 m/s
MV Peak grad: 103.2 mmHg
MV VTI: 2.69 cm2
Radius: 0.6 cm
S' Lateral: 2.6 cm
Single Plane A2C EF: 69.9 %
Single Plane A4C EF: 59.9 %

## 2024-01-16 ENCOUNTER — Ambulatory Visit: Admitting: Family Medicine

## 2024-01-21 ENCOUNTER — Telehealth: Payer: Self-pay | Admitting: Family Medicine

## 2024-01-21 NOTE — Telephone Encounter (Signed)
pantoprazole (PROTONIX) 40 MG tablet ° °

## 2024-01-21 NOTE — Telephone Encounter (Signed)
 Will refill at appt 12/3

## 2024-01-21 NOTE — Telephone Encounter (Signed)
metFORMIN (GLUCOPHAGE-XR) 750 MG 24 hr tablet

## 2024-01-21 NOTE — Telephone Encounter (Signed)
atorvastatin (LIPITOR) 20 MG tablet

## 2024-01-23 ENCOUNTER — Encounter: Payer: Self-pay | Admitting: Nurse Practitioner

## 2024-01-23 ENCOUNTER — Ambulatory Visit: Admitting: Nurse Practitioner

## 2024-01-23 ENCOUNTER — Telehealth: Payer: Self-pay

## 2024-01-23 VITALS — BP 114/66 | HR 72 | Temp 97.8°F | Ht 64.0 in | Wt 179.0 lb

## 2024-01-23 DIAGNOSIS — R053 Chronic cough: Secondary | ICD-10-CM

## 2024-01-23 DIAGNOSIS — Z7984 Long term (current) use of oral hypoglycemic drugs: Secondary | ICD-10-CM | POA: Diagnosis not present

## 2024-01-23 DIAGNOSIS — N958 Other specified menopausal and perimenopausal disorders: Secondary | ICD-10-CM | POA: Diagnosis not present

## 2024-01-23 DIAGNOSIS — D860 Sarcoidosis of lung: Secondary | ICD-10-CM | POA: Diagnosis not present

## 2024-01-23 DIAGNOSIS — M25561 Pain in right knee: Secondary | ICD-10-CM | POA: Diagnosis not present

## 2024-01-23 DIAGNOSIS — J984 Other disorders of lung: Secondary | ICD-10-CM | POA: Diagnosis not present

## 2024-01-23 DIAGNOSIS — E782 Mixed hyperlipidemia: Secondary | ICD-10-CM | POA: Diagnosis not present

## 2024-01-23 DIAGNOSIS — K219 Gastro-esophageal reflux disease without esophagitis: Secondary | ICD-10-CM | POA: Diagnosis not present

## 2024-01-23 DIAGNOSIS — Z130 Encounter for screening for diseases of the blood and blood-forming organs and certain disorders involving the immune mechanism: Secondary | ICD-10-CM

## 2024-01-23 DIAGNOSIS — Z6831 Body mass index (BMI) 31.0-31.9, adult: Secondary | ICD-10-CM | POA: Diagnosis not present

## 2024-01-23 DIAGNOSIS — E66811 Obesity, class 1: Secondary | ICD-10-CM

## 2024-01-23 DIAGNOSIS — E119 Type 2 diabetes mellitus without complications: Secondary | ICD-10-CM

## 2024-01-23 DIAGNOSIS — M25512 Pain in left shoulder: Secondary | ICD-10-CM | POA: Diagnosis not present

## 2024-01-23 DIAGNOSIS — G629 Polyneuropathy, unspecified: Secondary | ICD-10-CM

## 2024-01-23 LAB — CBC WITH DIFFERENTIAL/PLATELET
Absolute Lymphocytes: 3567 {cells}/uL (ref 850–3900)
Absolute Monocytes: 590 {cells}/uL (ref 200–950)
Basophils Absolute: 57 {cells}/uL (ref 0–200)
Basophils Relative: 0.7 %
Eosinophils Absolute: 254 {cells}/uL (ref 15–500)
Eosinophils Relative: 3.1 %
HCT: 35.6 % — ABNORMAL LOW (ref 35.9–46.0)
Hemoglobin: 11.9 g/dL (ref 11.7–15.5)
MCH: 29.2 pg (ref 27.0–33.0)
MCHC: 33.4 g/dL (ref 31.6–35.4)
MCV: 87.5 fL (ref 81.4–101.7)
MPV: 11.7 fL (ref 7.5–12.5)
Monocytes Relative: 7.2 %
Neutro Abs: 3731 {cells}/uL (ref 1500–7800)
Neutrophils Relative %: 45.5 %
Platelets: 260 Thousand/uL (ref 140–400)
RBC: 4.07 Million/uL (ref 3.80–5.10)
RDW: 13.1 % (ref 11.0–15.0)
Total Lymphocyte: 43.5 %
WBC: 8.2 Thousand/uL (ref 3.8–10.8)

## 2024-01-23 LAB — COMPREHENSIVE METABOLIC PANEL WITH GFR
AG Ratio: 1.7 (calc) (ref 1.0–2.5)
ALT: 22 U/L (ref 6–29)
AST: 21 U/L (ref 10–35)
Albumin: 4.3 g/dL (ref 3.6–5.1)
Alkaline phosphatase (APISO): 99 U/L (ref 37–153)
BUN/Creatinine Ratio: 19 (calc) (ref 6–22)
BUN: 24 mg/dL (ref 7–25)
CO2: 26 mmol/L (ref 20–32)
Calcium: 10.2 mg/dL (ref 8.6–10.4)
Chloride: 106 mmol/L (ref 98–110)
Creat: 1.26 mg/dL — ABNORMAL HIGH (ref 0.50–1.05)
Globulin: 2.6 g/dL (ref 1.9–3.7)
Glucose, Bld: 118 mg/dL — ABNORMAL HIGH (ref 65–99)
Potassium: 4.2 mmol/L (ref 3.5–5.3)
Sodium: 142 mmol/L (ref 135–146)
Total Bilirubin: 0.5 mg/dL (ref 0.2–1.2)
Total Protein: 6.9 g/dL (ref 6.1–8.1)
eGFR: 49 mL/min/1.73m2 — ABNORMAL LOW (ref >=60)

## 2024-01-23 LAB — LIPID PANEL
Cholesterol: 140 mg/dL (ref <200)
HDL: 51 mg/dL (ref >=50)
LDL Cholesterol (Calc): 60 mg/dL
Non-HDL Cholesterol (Calc): 89 mg/dL (ref <130)
Total CHOL/HDL Ratio: 2.7 (calc) (ref <5.0)
Triglycerides: 235 mg/dL — ABNORMAL HIGH (ref <150)

## 2024-01-23 LAB — HEMOGLOBIN A1C
Hgb A1c MFr Bld: 6.5 % — ABNORMAL HIGH (ref <5.7)
Mean Plasma Glucose: 140 mg/dL
eAG (mmol/L): 7.7 mmol/L

## 2024-01-23 MED ORDER — PANTOPRAZOLE SODIUM 40 MG PO TBEC: 40.0000 mg | DELAYED_RELEASE_TABLET | Freq: Every day | ORAL | 1 refills | Status: AC | PRN

## 2024-01-23 MED ORDER — MELOXICAM 15 MG PO TABS: 15.0000 mg | ORAL_TABLET | Freq: Every day | ORAL | 1 refills | Status: AC

## 2024-01-23 MED ORDER — GABAPENTIN 100 MG PO CAPS: 100.0000 mg | ORAL_CAPSULE | Freq: Two times a day (BID) | ORAL | 1 refills | Status: AC

## 2024-01-23 MED ORDER — ATORVASTATIN CALCIUM 20 MG PO TABS: 20.0000 mg | ORAL_TABLET | Freq: Every day | ORAL | 1 refills | Status: AC

## 2024-01-23 MED ORDER — METFORMIN HCL ER 750 MG PO TB24: 750.0000 mg | ORAL_TABLET | Freq: Two times a day (BID) | ORAL | 1 refills | Status: AC

## 2024-01-23 MED ORDER — BUDESONIDE-FORMOTEROL FUMARATE 160-4.5 MCG/ACT IN AERO: 2.0000 | INHALATION_SPRAY | Freq: Two times a day (BID) | RESPIRATORY_TRACT | 1 refills | Status: AC

## 2024-01-23 NOTE — Progress Notes (Signed)
 BP 114/66   Pulse 72   Temp 97.8 F (36.6 C)   Ht 5' 4 (1.626 m)   Wt 179 lb (81.2 kg)   SpO2 97%   BMI 30.73 kg/m    Subjective:    Patient ID: Brenda Ellison, female    DOB: 06/05/1960, 63 y.o.   MRN: [id number]  HPI: Brenda ROUTZAHN is a 63 y.o. female  Chief Complaint  Patient presents with   Medication Refill   Discussed the use of AI scribe software for clinical note transcription with the patient, who gave verbal consent to proceed.  History of Present Illness Brenda Ellison is a 63 year old female with type 2 diabetes, GERD, hyperlipidemia, sarcoidosis, small airway disease, and arthritis who presents for a routine follow-up and medication refills.  Glycemic control and diabetic neuropathy - Type 2 diabetes mellitus with home blood glucose monitoring showing stable morning readings at 123 mg/dL, with occasional elevations to 130-135 mg/dL - Metformin  750 mg twice daily for glycemic control - Gabapentin 100 mg twice daily for diabetic neuropathy - Requests refills for metformin  and gabapentin  Gastroesophageal reflux disease - GERD managed with pantoprazole  40 mg daily, taken inconsistently - Occasional chest tightness - Requests refill for pantoprazole   Hyperlipidemia - Hyperlipidemia managed with atorvastatin  20 mg daily - Most recent lipid panel: triglycerides 249 mg/dL - Requests refill for atorvastatin   Pulmonary symptoms and airway disease - Sarcoidosis and small airway disease managed with albuterol  inhaler as needed and Symbicort  twice daily - Breathing described as 'so-so' - Requests refill for symbicort  inhaler  Asthma management - Asthma management with Zyrtec, Flonase , Singulair , albuterol , and Symbicort  - No acute respiratory issues  Arthralgia - Arthritis managed with meloxicam  for pain control         01/23/2024    3:20 PM 07/09/2023    3:09 PM 04/10/2023    2:48 PM  Depression screen PHQ 2/9  Decreased Interest 1 3 0  Down,  Depressed, Hopeless 1 3 0  PHQ - 2 Score 2 6 0  Altered sleeping 0 0 0  Tired, decreased energy 1 3 0  Change in appetite 0 0 0  Feeling bad or failure about yourself  1 1 0  Trouble concentrating 1 1 0  Moving slowly or fidgety/restless 0 0 0  Suicidal thoughts 0 0 0  PHQ-9 Score 5 11  0   Difficult doing work/chores Not difficult at all  Not difficult at all     Data saved with a previous flowsheet row definition    Relevant past medical, surgical, family and social history reviewed and updated as indicated. Interim medical history since our last visit reviewed. Allergies and medications reviewed and updated.  Review of Systems  Constitutional: Negative for fever or weight change.  Respiratory: Negative for cough and shortness of breath.   Cardiovascular: Negative for chest pain or palpitations.  Gastrointestinal: Negative for abdominal pain, no bowel changes.  Musculoskeletal: Negative for gait problem or joint swelling.  Skin: Negative for rash.  Neurological: Negative for dizziness or headache.  No other specific complaints in a complete review of systems (except as listed in HPI above).      Objective:      BP 114/66   Pulse 72   Temp 97.8 F (36.6 C)   Ht 5' 4 (1.626 m)   Wt 179 lb (81.2 kg)   SpO2 97%   BMI 30.73 kg/m    Wt Readings from Last 3 Encounters:  01/23/24 179 lb (81.2 kg)  11/30/23 174 lb 6.4 oz (79.1 kg)  11/27/23 175 lb 6.4 oz (79.6 kg)    Physical Exam GENERAL: Alert, cooperative, well developed, no acute distress HEENT: Normocephalic, normal oropharynx, moist mucous membranes CHEST: Clear to auscultation bilaterally, No wheezes, rhonchi, or crackles CARDIOVASCULAR: Normal heart rate and rhythm, S1 and S2 normal without murmurs ABDOMEN: Soft, non-tender, non-distended, without organomegaly, Normal bowel sounds EXTREMITIES: No cyanosis or edema NEUROLOGICAL: Cranial nerves grossly intact, Moves all extremities without gross motor or sensory  deficit  Results for orders placed or performed during the hospital encounter of 01/07/24  ECHOCARDIOGRAM COMPLETE   Collection Time: 01/07/24 11:23 AM  Result Value Ref Range   Ao pk vel 1.39 m/s   AV Area VTI 2.58 cm2   AR max vel 2.57 cm2   AV Mean grad 4.0 mmHg   AV Peak grad 7.7 mmHg   Single Plane A2C EF 69.9 %   Single Plane A4C EF 59.9 %   Calc EF 66.1 %   S' Lateral 2.60 cm   AV Area mean vel 2.49 cm2   Area-P 1/2 3.60 cm2   MV VTI 2.69 cm2   MV M vel 5.08 m/s   MV Peak grad 103.2 mmHg   Radius 0.60 cm   Est EF 65 - 70%           Assessment & Plan:   Problem List Items Addressed This Visit       Respiratory   Small airways disease   Sarcoidosis of lung     Digestive   Gastroesophageal reflux disease   Relevant Medications   pantoprazole  (PROTONIX ) 40 MG tablet     Endocrine   Type 2 diabetes mellitus without complication, without long-term current use of insulin (HCC) - Primary   Relevant Medications   metFORMIN  (GLUCOPHAGE -XR) 750 MG 24 hr tablet   atorvastatin  (LIPITOR) 20 MG tablet   Other Relevant Orders   Comprehensive metabolic panel with GFR   Hemoglobin A1c     Genitourinary   Genitourinary syndrome of menopause     Other   Hyperlipidemia   Relevant Medications   atorvastatin  (LIPITOR) 20 MG tablet   Other Relevant Orders   Comprehensive metabolic panel with GFR   Lipid panel   Class 1 obesity with serious comorbidity and body mass index (BMI) of 31.0 to 31.9 in adult   Relevant Medications   metFORMIN  (GLUCOPHAGE -XR) 750 MG 24 hr tablet   Other Visit Diagnoses       Screening for deficiency anemia       Relevant Orders   CBC with Differential/Platelet     Neuropathy       Relevant Medications   gabapentin (NEURONTIN) 100 MG capsule     Acute pain of right knee       acute pain effusion and falls, xray from ER showed OA   Relevant Medications   meloxicam  (MOBIC ) 15 MG tablet     Acute pain of left shoulder       Relevant  Medications   meloxicam  (MOBIC ) 15 MG tablet     Persistent cough       Relevant Medications   budesonide -formoterol  (SYMBICORT ) 160-4.5 MCG/ACT inhaler        Assessment and Plan Assessment & Plan 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 Blood sugar levels are generally well-controlled with recent readings of 123 mg/dL, 869 mg/dL, and 864 mg/dL. Gabapentin is  used for neuropathy management. - Refilled metformin  750 mg twice daily - Refilled gabapentin 100 mg twice daily  Gastroesophageal reflux disease Occasional chest tightness. Not consistently taking pantoprazole . - Refilled pantoprazole  40 mg daily  Mixed hyperlipidemia Triglycerides elevated at 249 mg/dL. Currently on atorvastatin  20 mg daily. - Refilled atorvastatin  20 mg daily  Obesity, class 1 Obesity, class 1. Discussed waist circumference as a more accurate measure of health than BMI. - Encourage continuation of lifestyle modifications, including dietary management and regular exercise. -continue to increase physical activity, getting at least 150 min of physical activity a week.  Work on including runner, broadcasting/film/video 2 days a week.  - continue eating at a calorie deficit 1600-1700 cal a day, eating a well balanced diet with whole foods, avoiding processed foods.   Patient is motivated to continue working on lifestyle modification.    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 Breathing is generally okay but requires albuterol  for emergencies. 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  Allergic rhinitis Managed with Zyrtec, Flonase , and Singulair . - Continue Zyrtec, Flonase , and Singulair   Osteoarthritis, multiple  sites Osteoarthritis with significant pain. Currently taking meloxicam  for pain management. - Refilled meloxicam   General Health Maintenance Routine follow-up and medication management discussed. - Ordered lab work to monitor health status        Follow up plan: Return in about 4 months (around 05/23/2024) for follow up.

## 2024-01-23 NOTE — Telephone Encounter (Signed)
Refill request on pantoprazole (PROTONIX) 40 MG tablet.

## 2024-01-24 ENCOUNTER — Ambulatory Visit: Payer: Self-pay | Admitting: Nurse Practitioner

## 2024-01-25 ENCOUNTER — Telehealth: Payer: Self-pay

## 2024-01-25 NOTE — Telephone Encounter (Signed)
 Copied from CRM #[id number]. Topic: Clinical - Lab/Test Results >> Jan 25, 2024 12:34 PM Tinnie C wrote: Reason for CRM: Pt has questions about lab results that were recently relayed, concerned about kidneys, please return call at [phone].

## 2024-01-25 NOTE — Telephone Encounter (Signed)
 Patient aware kidney function is down a little bit, and Mliss will recheck at next appointment. Mliss advised avoiding NSAIDs like ibuprofen , advil  and aleve .

## 2024-02-08 ENCOUNTER — Emergency Department
Admission: EM | Admit: 2024-02-08 | Discharge: 2024-02-08 | Disposition: A | Discharge status: Home / Self Care | Attending: Emergency Medicine | Admitting: Emergency Medicine

## 2024-02-08 ENCOUNTER — Other Ambulatory Visit: Payer: Self-pay

## 2024-02-08 DIAGNOSIS — Y92410 Unspecified street and highway as the place of occurrence of the external cause: Secondary | ICD-10-CM | POA: Insufficient documentation

## 2024-02-08 DIAGNOSIS — M545 Low back pain, unspecified: Secondary | ICD-10-CM | POA: Diagnosis present

## 2024-02-08 MED ORDER — KETOROLAC TROMETHAMINE 15 MG/ML IJ SOLN
15.0000 mg | Freq: Once | INTRAMUSCULAR | Status: AC
  Administered 2024-02-08: 15 mg via INTRAMUSCULAR
  Filled 2024-02-08: qty 1

## 2024-02-08 MED ORDER — LIDOCAINE 5 % EX PTCH
1.0000 | MEDICATED_PATCH | CUTANEOUS | Status: DC
  Administered 2024-02-08: 1 via TRANSDERMAL
  Filled 2024-02-08: qty 1
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
## 2024-03-05 ENCOUNTER — Encounter: Payer: Self-pay | Admitting: Nurse Practitioner

## 2024-03-12 ENCOUNTER — Telehealth: Payer: Self-pay

## 2024-03-12 NOTE — Telephone Encounter (Signed)
 Copied from CRM #[id number]. Topic: Clinical - Request for Lab/Test Order >> Mar 12, 2024  1:30 PM Lonell PEDLAR wrote: Reason for CRM: Patient received a letter that she is due for her Mammogram. Please place order so pt can schedule and contact pt to advise when done.

## 2024-03-13 ENCOUNTER — Other Ambulatory Visit: Payer: Self-pay

## 2024-03-13 DIAGNOSIS — Z1231 Encounter for screening mammogram for malignant neoplasm of breast: Secondary | ICD-10-CM

## 2024-03-13 NOTE — Telephone Encounter (Signed)
 Mammogram order placed

## 2024-04-11 ENCOUNTER — Encounter

## 2024-04-23 ENCOUNTER — Ambulatory Visit: Admitting: Nurse Practitioner
